# Patient Record
Sex: Male | Born: 1990
Health system: Southern US, Community
[De-identification: ages and names within clinical notes are randomized; demographics above are authoritative.]

## PROBLEM LIST (undated history)

## (undated) DIAGNOSIS — M86159 Other acute osteomyelitis, unspecified femur: Secondary | ICD-10-CM

## (undated) DIAGNOSIS — L02213 Cutaneous abscess of chest wall: Secondary | ICD-10-CM

## (undated) DIAGNOSIS — R7881 Bacteremia: Secondary | ICD-10-CM

## (undated) DIAGNOSIS — M869 Osteomyelitis, unspecified: Secondary | ICD-10-CM

## (undated) DIAGNOSIS — M009 Pyogenic arthritis, unspecified: Secondary | ICD-10-CM

## (undated) DIAGNOSIS — M4628 Osteomyelitis of vertebra, sacral and sacrococcygeal region: Secondary | ICD-10-CM

## (undated) DIAGNOSIS — I2690 Septic pulmonary embolism without acute cor pulmonale: Secondary | ICD-10-CM

---
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
## 2014-09-27 DIAGNOSIS — R7881 Bacteremia: Secondary | ICD-10-CM

## 2014-09-27 HISTORY — DX: Bacteremia: R78.81

## 2015-10-22 ENCOUNTER — Inpatient Hospital Stay (HOSPITAL_COMMUNITY)
Admission: AD | Admit: 2015-10-22 | Discharge: 2015-12-18 | DRG: 003 | Disposition: A | Discharge status: Home Health | Payer: Medicaid Other | Source: Other Acute Inpatient Hospital | Attending: Internal Medicine | Admitting: Internal Medicine

## 2015-10-22 ENCOUNTER — Inpatient Hospital Stay (HOSPITAL_COMMUNITY): Payer: Medicaid Other

## 2015-10-22 DIAGNOSIS — Z452 Encounter for adjustment and management of vascular access device: Secondary | ICD-10-CM

## 2015-10-22 DIAGNOSIS — J15212 Pneumonia due to Methicillin resistant Staphylococcus aureus: Secondary | ICD-10-CM | POA: Diagnosis present

## 2015-10-22 DIAGNOSIS — R4182 Altered mental status, unspecified: Secondary | ICD-10-CM

## 2015-10-22 DIAGNOSIS — A4102 Sepsis due to Methicillin resistant Staphylococcus aureus: Secondary | ICD-10-CM | POA: Diagnosis present

## 2015-10-22 DIAGNOSIS — Y95 Nosocomial condition: Secondary | ICD-10-CM | POA: Diagnosis not present

## 2015-10-22 DIAGNOSIS — I472 Ventricular tachycardia: Secondary | ICD-10-CM | POA: Diagnosis not present

## 2015-10-22 DIAGNOSIS — I2690 Septic pulmonary embolism without acute cor pulmonale: Secondary | ICD-10-CM | POA: Diagnosis present

## 2015-10-22 DIAGNOSIS — J9611 Chronic respiratory failure with hypoxia: Secondary | ICD-10-CM | POA: Insufficient documentation

## 2015-10-22 DIAGNOSIS — R339 Retention of urine, unspecified: Secondary | ICD-10-CM | POA: Diagnosis not present

## 2015-10-22 DIAGNOSIS — M869 Osteomyelitis, unspecified: Secondary | ICD-10-CM

## 2015-10-22 DIAGNOSIS — L02512 Cutaneous abscess of left hand: Secondary | ICD-10-CM | POA: Diagnosis present

## 2015-10-22 DIAGNOSIS — R7881 Bacteremia: Secondary | ICD-10-CM | POA: Diagnosis present

## 2015-10-22 DIAGNOSIS — J9690 Respiratory failure, unspecified, unspecified whether with hypoxia or hypercapnia: Secondary | ICD-10-CM

## 2015-10-22 DIAGNOSIS — Z4659 Encounter for fitting and adjustment of other gastrointestinal appliance and device: Secondary | ICD-10-CM

## 2015-10-22 DIAGNOSIS — N390 Urinary tract infection, site not specified: Secondary | ICD-10-CM | POA: Diagnosis present

## 2015-10-22 DIAGNOSIS — K5900 Constipation, unspecified: Secondary | ICD-10-CM | POA: Diagnosis not present

## 2015-10-22 DIAGNOSIS — J9601 Acute respiratory failure with hypoxia: Secondary | ICD-10-CM | POA: Diagnosis present

## 2015-10-22 DIAGNOSIS — J9602 Acute respiratory failure with hypercapnia: Secondary | ICD-10-CM | POA: Diagnosis present

## 2015-10-22 DIAGNOSIS — D696 Thrombocytopenia, unspecified: Secondary | ICD-10-CM | POA: Diagnosis present

## 2015-10-22 DIAGNOSIS — K6812 Psoas muscle abscess: Secondary | ICD-10-CM | POA: Diagnosis present

## 2015-10-22 DIAGNOSIS — J810 Acute pulmonary edema: Secondary | ICD-10-CM | POA: Diagnosis present

## 2015-10-22 DIAGNOSIS — E871 Hypo-osmolality and hyponatremia: Secondary | ICD-10-CM | POA: Diagnosis not present

## 2015-10-22 DIAGNOSIS — K831 Obstruction of bile duct: Secondary | ICD-10-CM | POA: Diagnosis not present

## 2015-10-22 DIAGNOSIS — R1310 Dysphagia, unspecified: Secondary | ICD-10-CM | POA: Diagnosis not present

## 2015-10-22 DIAGNOSIS — L02511 Cutaneous abscess of right hand: Secondary | ICD-10-CM | POA: Diagnosis present

## 2015-10-22 DIAGNOSIS — L02612 Cutaneous abscess of left foot: Secondary | ICD-10-CM | POA: Diagnosis present

## 2015-10-22 DIAGNOSIS — Y738 Miscellaneous gastroenterology and urology devices associated with adverse incidents, not elsewhere classified: Secondary | ICD-10-CM | POA: Diagnosis not present

## 2015-10-22 DIAGNOSIS — R451 Restlessness and agitation: Secondary | ICD-10-CM | POA: Diagnosis not present

## 2015-10-22 DIAGNOSIS — T8383XA Hemorrhage of genitourinary prosthetic devices, implants and grafts, initial encounter: Secondary | ICD-10-CM | POA: Diagnosis present

## 2015-10-22 DIAGNOSIS — R52 Pain, unspecified: Secondary | ICD-10-CM

## 2015-10-22 DIAGNOSIS — L02415 Cutaneous abscess of right lower limb: Secondary | ICD-10-CM | POA: Diagnosis present

## 2015-10-22 DIAGNOSIS — M868X8 Other osteomyelitis, other site: Secondary | ICD-10-CM | POA: Diagnosis not present

## 2015-10-22 DIAGNOSIS — E039 Hypothyroidism, unspecified: Secondary | ICD-10-CM | POA: Diagnosis present

## 2015-10-22 DIAGNOSIS — I248 Other forms of acute ischemic heart disease: Secondary | ICD-10-CM | POA: Diagnosis present

## 2015-10-22 DIAGNOSIS — D638 Anemia in other chronic diseases classified elsewhere: Secondary | ICD-10-CM | POA: Diagnosis present

## 2015-10-22 DIAGNOSIS — F1123 Opioid dependence with withdrawal: Secondary | ICD-10-CM | POA: Diagnosis not present

## 2015-10-22 DIAGNOSIS — M4627 Osteomyelitis of vertebra, lumbosacral region: Secondary | ICD-10-CM | POA: Diagnosis not present

## 2015-10-22 DIAGNOSIS — J9600 Acute respiratory failure, unspecified whether with hypoxia or hypercapnia: Secondary | ICD-10-CM

## 2015-10-22 DIAGNOSIS — R0603 Acute respiratory distress: Secondary | ICD-10-CM

## 2015-10-22 DIAGNOSIS — L0291 Cutaneous abscess, unspecified: Secondary | ICD-10-CM

## 2015-10-22 DIAGNOSIS — Y92239 Unspecified place in hospital as the place of occurrence of the external cause: Secondary | ICD-10-CM | POA: Diagnosis not present

## 2015-10-22 DIAGNOSIS — F17210 Nicotine dependence, cigarettes, uncomplicated: Secondary | ICD-10-CM | POA: Diagnosis present

## 2015-10-22 DIAGNOSIS — F1910 Other psychoactive substance abuse, uncomplicated: Secondary | ICD-10-CM | POA: Diagnosis present

## 2015-10-22 DIAGNOSIS — R310 Gross hematuria: Secondary | ICD-10-CM | POA: Diagnosis not present

## 2015-10-22 DIAGNOSIS — F22 Delusional disorders: Secondary | ICD-10-CM | POA: Diagnosis not present

## 2015-10-22 DIAGNOSIS — Z930 Tracheostomy status: Secondary | ICD-10-CM | POA: Insufficient documentation

## 2015-10-22 DIAGNOSIS — G9340 Encephalopathy, unspecified: Secondary | ICD-10-CM | POA: Diagnosis present

## 2015-10-22 DIAGNOSIS — J90 Pleural effusion, not elsewhere classified: Secondary | ICD-10-CM | POA: Diagnosis present

## 2015-10-22 DIAGNOSIS — R609 Edema, unspecified: Secondary | ICD-10-CM

## 2015-10-22 DIAGNOSIS — L539 Erythematous condition, unspecified: Secondary | ICD-10-CM

## 2015-10-22 DIAGNOSIS — R0602 Shortness of breath: Secondary | ICD-10-CM

## 2015-10-22 DIAGNOSIS — Z978 Presence of other specified devices: Secondary | ICD-10-CM

## 2015-10-22 DIAGNOSIS — Z9889 Other specified postprocedural states: Secondary | ICD-10-CM

## 2015-10-22 DIAGNOSIS — R401 Stupor: Secondary | ICD-10-CM

## 2015-10-22 DIAGNOSIS — Z22322 Carrier or suspected carrier of Methicillin resistant Staphylococcus aureus: Secondary | ICD-10-CM | POA: Diagnosis present

## 2015-10-22 DIAGNOSIS — M009 Pyogenic arthritis, unspecified: Secondary | ICD-10-CM | POA: Diagnosis present

## 2015-10-22 DIAGNOSIS — R6520 Severe sepsis without septic shock: Secondary | ICD-10-CM | POA: Diagnosis present

## 2015-10-22 DIAGNOSIS — F419 Anxiety disorder, unspecified: Secondary | ICD-10-CM | POA: Diagnosis present

## 2015-10-22 DIAGNOSIS — B9562 Methicillin resistant Staphylococcus aureus infection as the cause of diseases classified elsewhere: Secondary | ICD-10-CM

## 2015-10-22 DIAGNOSIS — M4628 Osteomyelitis of vertebra, sacral and sacrococcygeal region: Secondary | ICD-10-CM | POA: Diagnosis present

## 2015-10-22 DIAGNOSIS — M79641 Pain in right hand: Secondary | ICD-10-CM | POA: Diagnosis present

## 2015-10-22 DIAGNOSIS — E781 Pure hyperglyceridemia: Secondary | ICD-10-CM | POA: Diagnosis present

## 2015-10-22 DIAGNOSIS — Z781 Physical restraint status: Secondary | ICD-10-CM

## 2015-10-22 DIAGNOSIS — J189 Pneumonia, unspecified organism: Secondary | ICD-10-CM

## 2015-10-22 LAB — POCT I-STAT 3, ART BLOOD GAS (G3+)
ACID-BASE DEFICIT: 1 mmol/L (ref 0.0–2.0)
BICARBONATE: 23.3 meq/L (ref 20.0–24.0)
O2 Saturation: 99 %
PO2 ART: 135 mmHg — AB (ref 80.0–100.0)
TCO2: 24 mmol/L (ref 0–100)
pCO2 arterial: 37.5 mmHg (ref 35.0–45.0)
pH, Arterial: 7.402 (ref 7.350–7.450)

## 2015-10-22 LAB — COMPREHENSIVE METABOLIC PANEL
ALBUMIN: 1.8 g/dL — AB (ref 3.5–5.0)
ALK PHOS: 333 U/L — AB (ref 38–126)
ALT: 56 U/L (ref 17–63)
ANION GAP: 10 (ref 5–15)
AST: 52 U/L — AB (ref 15–41)
BILIRUBIN TOTAL: 3 mg/dL — AB (ref 0.3–1.2)
BUN: 14 mg/dL (ref 6–20)
CALCIUM: 7.6 mg/dL — AB (ref 8.9–10.3)
CO2: 22 mmol/L (ref 22–32)
Chloride: 101 mmol/L (ref 101–111)
Creatinine, Ser: 0.85 mg/dL (ref 0.61–1.24)
GFR calc Af Amer: >60 mL/min (ref >60)
GLUCOSE: 92 mg/dL (ref 65–99)
Potassium: 3.5 mmol/L (ref 3.5–5.1)
Sodium: 133 mmol/L — ABNORMAL LOW (ref 135–145)
TOTAL PROTEIN: 5.4 g/dL — AB (ref 6.5–8.1)

## 2015-10-22 LAB — TROPONIN I: Troponin I: 0.24 ng/mL — ABNORMAL HIGH (ref <0.031)

## 2015-10-22 LAB — TRIGLYCERIDES: TRIGLYCERIDES: 164 mg/dL — AB (ref <150)

## 2015-10-22 LAB — BRAIN NATRIURETIC PEPTIDE: B NATRIURETIC PEPTIDE 5: 400.5 pg/mL — AB (ref 0.0–100.0)

## 2015-10-22 LAB — GLUCOSE, CAPILLARY
GLUCOSE-CAPILLARY: 99 mg/dL (ref 65–99)
Glucose-Capillary: 99 mg/dL (ref 65–99)

## 2015-10-22 LAB — PROCALCITONIN: Procalcitonin: 9.9 ng/mL

## 2015-10-22 LAB — LACTIC ACID, PLASMA: LACTIC ACID, VENOUS: 1.8 mmol/L (ref 0.5–2.0)

## 2015-10-22 LAB — CK: CK TOTAL: 29 U/L — AB (ref 49–397)

## 2015-10-22 MED ORDER — PIPERACILLIN-TAZOBACTAM 3.375 G IVPB
3.3750 g | Freq: Three times a day (TID) | INTRAVENOUS | Status: DC
  Administered 2015-10-22 – 2015-10-23 (×2): 3.375 g via INTRAVENOUS
  Filled 2015-10-22 (×4): qty 50

## 2015-10-22 MED ORDER — PROPOFOL 1000 MG/100ML IV EMUL
0.0000 ug/kg/min | INTRAVENOUS | Status: DC
  Administered 2015-10-22: 65 ug/kg/min via INTRAVENOUS
  Administered 2015-10-22 – 2015-10-23 (×3): 40 ug/kg/min via INTRAVENOUS
  Filled 2015-10-22 (×4): qty 100

## 2015-10-22 MED ORDER — SODIUM CHLORIDE 0.9 % IV SOLN
25.0000 ug/h | INTRAVENOUS | Status: DC
  Administered 2015-10-22 – 2015-10-23 (×2): 250 ug/h via INTRAVENOUS
  Filled 2015-10-22 (×2): qty 50

## 2015-10-22 MED ORDER — SODIUM CHLORIDE 0.9 % IV SOLN
INTRAVENOUS | Status: DC
  Administered 2015-10-22: 21:00:00 via INTRAVENOUS

## 2015-10-22 MED ORDER — VANCOMYCIN HCL IN DEXTROSE 1-5 GM/200ML-% IV SOLN
1000.0000 mg | Freq: Three times a day (TID) | INTRAVENOUS | Status: DC
  Administered 2015-10-22 – 2015-10-24 (×6): 1000 mg via INTRAVENOUS
  Filled 2015-10-22 (×8): qty 200

## 2015-10-22 MED ORDER — FENTANYL BOLUS VIA INFUSION
50.0000 ug | INTRAVENOUS | Status: DC | PRN
  Filled 2015-10-22: qty 50

## 2015-10-22 MED ORDER — ACETAMINOPHEN 325 MG PO TABS
650.0000 mg | ORAL_TABLET | Freq: Four times a day (QID) | ORAL | Status: DC | PRN
  Administered 2015-10-22 – 2015-10-25 (×4): 650 mg via ORAL
  Filled 2015-10-22 (×4): qty 2

## 2015-10-22 MED ORDER — ANTISEPTIC ORAL RINSE SOLUTION (CORINZ): 7.0000 mL | OROMUCOSAL | Status: DC

## 2015-10-22 MED ORDER — SODIUM CHLORIDE 0.9 % IV SOLN
Freq: Once | INTRAVENOUS | Status: AC
  Administered 2015-10-22: 22:00:00 via INTRAVENOUS

## 2015-10-22 MED ORDER — CHLORHEXIDINE GLUCONATE 0.12% ORAL RINSE (MEDLINE KIT)
15.0000 mL | Freq: Two times a day (BID) | OROMUCOSAL | Status: DC
  Administered 2015-10-23 – 2015-10-29 (×14): 15 mL via OROMUCOSAL

## 2015-10-22 MED ORDER — FENTANYL CITRATE (PF) 100 MCG/2ML IJ SOLN
50.0000 ug | Freq: Once | INTRAMUSCULAR | Status: AC
  Administered 2015-10-22: 50 ug via INTRAVENOUS

## 2015-10-22 MED ORDER — CHLORHEXIDINE GLUCONATE 0.12% ORAL RINSE (MEDLINE KIT)
15.0000 mL | Freq: Two times a day (BID) | OROMUCOSAL | Status: DC
  Administered 2015-10-22: 15 mL via OROMUCOSAL

## 2015-10-22 MED ORDER — HEPARIN SODIUM (PORCINE) 5000 UNIT/ML IJ SOLN
5000.0000 [IU] | Freq: Three times a day (TID) | INTRAMUSCULAR | Status: DC
  Administered 2015-10-22 – 2015-11-04 (×32): 5000 [IU] via SUBCUTANEOUS
  Filled 2015-10-22 (×43): qty 1

## 2015-10-22 MED ORDER — SODIUM CHLORIDE 0.9 % IV BOLUS (SEPSIS): 1000.0000 mL | Freq: Once | INTRAVENOUS | Status: DC

## 2015-10-22 MED ORDER — ANTISEPTIC ORAL RINSE SOLUTION (CORINZ)
7.0000 mL | Freq: Four times a day (QID) | OROMUCOSAL | Status: DC
  Administered 2015-10-22 – 2015-10-28 (×23): 7 mL via OROMUCOSAL

## 2015-10-22 MED ORDER — PROPOFOL 1000 MG/100ML IV EMUL: 0.0000 ug/kg/min | INTRAVENOUS | Status: DC

## 2015-10-22 MED ORDER — PANTOPRAZOLE SODIUM 40 MG PO PACK
40.0000 mg | PACK | Freq: Every day | ORAL | Status: DC
  Administered 2015-10-22: 40 mg
  Filled 2015-10-22 (×2): qty 20

## 2015-10-22 NOTE — Progress Notes (Signed)
eLink Physician-Brief Progress Note Patient Name: Jolene Schimkerey Netherland DOB: [birth year] MRN: [id number]   Date of Service  10/22/2015  HPI/Events of Note  New admission from [facility] - 25 yo man, drug abuse and bacteremia, septic emboli. Probable endocarditis although I do not know if this has been established yet. Will review [facility] notes when available.   eICU Interventions  - vent orders - sedation orders - CXR now - repeat cx's - tylenol for fevers     Intervention Category Major Interventions: Respiratory failure - evaluation and management;Sepsis - evaluation and management  Nasier Thumm S. 10/22/2015, 5:54 PM

## 2015-10-22 NOTE — Progress Notes (Signed)
Pharmacy Antibiotic Note  Kevin Austin is a 25 y.o. male admitted on 10/22/2015 with sepsis.  Pharmacy has been consulted for vancomycin and Zosyn dosing.  Received 1500 mg of [facility] this morning at 10 AM, and a dose of cefepime at 11 AM.  Plan: Vancomycin 1g IV q 8 hrs.  Next dose now. Zosyn 3.375g IV q 8 hrs - 4 hr extended infusion. F/u cultures, renal function, and clinical progress.  Height: 5\' 11"  (180.3 cm) Weight: 183 lb 10.3 oz (83.3 kg) IBW/kg (Calculated) : 75.3  Temp (24hrs), Avg:101.3 F (38.5 C), Min:101.1 F (38.4 C), Max:101.3 F (38.5 C)  No results for input(s): WBC, CREATININE, LATICACIDVEN, VANCOTROUGH, VANCOPEAK, VANCORANDOM, GENTTROUGH, GENTPEAK, GENTRANDOM, TOBRATROUGH, TOBRAPEAK, TOBRARND, AMIKACINPEAK, AMIKACINTROU, AMIKACIN in the last 168 hours.  CrCl cannot be calculated (Patient has no serum creatinine result on file.).    Allergies not on file  Antimicrobials this admission: Vanc 6/26 >  Zosyn 6/26 >> Cefepime 6/26 >6/26  Dose adjustments this admission:   Microbiology results: 6/26 BCx:  6/26 UCx:  6/26 Sputum:   Thank you for allowing pharmacy to be a part of this patient's care.  Tad MooreJessica Fawnda Vitullo, Pharm D, BCPS  Clinical Pharmacist Pager [phone]  10/22/2015 7:31 PM
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
## 2015-10-23 ENCOUNTER — Inpatient Hospital Stay (HOSPITAL_COMMUNITY): Payer: Medicaid Other

## 2015-10-23 ENCOUNTER — Encounter (HOSPITAL_COMMUNITY)
Admission: AD | Disposition: A | Discharge status: Home Health | Payer: Self-pay | Source: Other Acute Inpatient Hospital | Attending: Pulmonary Disease

## 2015-10-23 ENCOUNTER — Inpatient Hospital Stay (HOSPITAL_COMMUNITY): Payer: Medicaid Other | Admitting: Certified Registered Nurse Anesthetist

## 2015-10-23 DIAGNOSIS — L0291 Cutaneous abscess, unspecified: Secondary | ICD-10-CM

## 2015-10-23 DIAGNOSIS — L02512 Cutaneous abscess of left hand: Secondary | ICD-10-CM | POA: Diagnosis present

## 2015-10-23 HISTORY — PX: I & D EXTREMITY: SHX5045

## 2015-10-23 HISTORY — PX: IRRIGATION AND DEBRIDEMENT FOOT: SHX6602

## 2015-10-23 LAB — BLOOD CULTURE ID PANEL (REFLEXED)
Acinetobacter baumannii: NOT DETECTED
CANDIDA GLABRATA: NOT DETECTED
CANDIDA KRUSEI: NOT DETECTED
CANDIDA PARAPSILOSIS: NOT DETECTED
CANDIDA TROPICALIS: NOT DETECTED
Candida albicans: NOT DETECTED
Carbapenem resistance: NOT DETECTED
ESCHERICHIA COLI: NOT DETECTED
Enterobacter cloacae complex: NOT DETECTED
Enterobacteriaceae species: NOT DETECTED
Enterococcus species: NOT DETECTED
HAEMOPHILUS INFLUENZAE: NOT DETECTED
KLEBSIELLA PNEUMONIAE: NOT DETECTED
Klebsiella oxytoca: NOT DETECTED
LISTERIA MONOCYTOGENES: NOT DETECTED
METHICILLIN RESISTANCE: DETECTED — AB
Neisseria meningitidis: NOT DETECTED
PROTEUS SPECIES: NOT DETECTED
Pseudomonas aeruginosa: NOT DETECTED
SERRATIA MARCESCENS: NOT DETECTED
STREPTOCOCCUS PYOGENES: NOT DETECTED
Staphylococcus aureus (BCID): DETECTED — AB
Staphylococcus species: DETECTED — AB
Streptococcus agalactiae: NOT DETECTED
Streptococcus pneumoniae: NOT DETECTED
Streptococcus species: NOT DETECTED
Vancomycin resistance: NOT DETECTED

## 2015-10-23 LAB — CBC WITH DIFFERENTIAL/PLATELET
BASOS PCT: 0 %
Basophils Absolute: 0 10*3/uL (ref 0.0–0.1)
EOS ABS: 0 10*3/uL (ref 0.0–0.7)
EOS PCT: 0 %
HEMATOCRIT: 33.2 % — AB (ref 39.0–52.0)
Hemoglobin: 11.1 g/dL — ABNORMAL LOW (ref 13.0–17.0)
LYMPHS ABS: 1.1 10*3/uL (ref 0.7–4.0)
Lymphocytes Relative: 6 %
MCH: 27.3 pg (ref 26.0–34.0)
MCHC: 33.4 g/dL (ref 30.0–36.0)
MCV: 81.8 fL (ref 78.0–100.0)
MONO ABS: 1.1 10*3/uL — AB (ref 0.1–1.0)
Monocytes Relative: 6 %
NEUTROS ABS: 16.3 10*3/uL — AB (ref 1.7–7.7)
NEUTROS PCT: 88 %
PLATELETS: 82 10*3/uL — AB (ref 150–400)
RBC: 4.06 MIL/uL — ABNORMAL LOW (ref 4.22–5.81)
RDW: 15.8 % — AB (ref 11.5–15.5)
WBC: 18.5 10*3/uL — ABNORMAL HIGH (ref 4.0–10.5)

## 2015-10-23 LAB — HIV ANTIBODY (ROUTINE TESTING W REFLEX): HIV Screen 4th Generation wRfx: NONREACTIVE

## 2015-10-23 LAB — TROPONIN I
TROPONIN I: 0.06 ng/mL — AB (ref <0.03)
TROPONIN I: 0.1 ng/mL — AB (ref <0.03)

## 2015-10-23 LAB — APTT: APTT: 40 s — AB (ref 24–37)

## 2015-10-23 LAB — TRIGLYCERIDES: TRIGLYCERIDES: 178 mg/dL — AB (ref <150)

## 2015-10-23 LAB — PROTIME-INR
INR: 1.35 (ref 0.00–1.49)
Prothrombin Time: 16.8 seconds — ABNORMAL HIGH (ref 11.6–15.2)

## 2015-10-23 LAB — MRSA PCR SCREENING: MRSA by PCR: POSITIVE — AB

## 2015-10-23 LAB — PROCALCITONIN: PROCALCITONIN: 10.77 ng/mL

## 2015-10-23 SURGERY — IRRIGATION AND DEBRIDEMENT EXTREMITY
Anesthesia: General | Site: Hand | Laterality: Left

## 2015-10-23 MED ORDER — SODIUM CHLORIDE 0.9 % IR SOLN
Status: DC | PRN
  Administered 2015-10-23 (×2): 3000 mL

## 2015-10-23 MED ORDER — LIDOCAINE HCL (CARDIAC) 20 MG/ML IV SOLN
INTRAVENOUS | Status: DC | PRN
  Administered 2015-10-23: 100 mg via INTRAVENOUS

## 2015-10-23 MED ORDER — MIDAZOLAM HCL 5 MG/5ML IJ SOLN
INTRAMUSCULAR | Status: DC | PRN
  Administered 2015-10-23: 4 mg via INTRAVENOUS
  Administered 2015-10-23: 2 mg via INTRAVENOUS

## 2015-10-23 MED ORDER — FENTANYL CITRATE (PF) 250 MCG/5ML IJ SOLN
INTRAMUSCULAR | Status: AC
  Filled 2015-10-23: qty 5

## 2015-10-23 MED ORDER — LACTATED RINGERS IV SOLN
INTRAVENOUS | Status: DC | PRN
  Administered 2015-10-23 (×2): via INTRAVENOUS

## 2015-10-23 MED ORDER — MUPIROCIN 2 % EX OINT
1.0000 "application " | TOPICAL_OINTMENT | Freq: Two times a day (BID) | CUTANEOUS | Status: AC
  Administered 2015-10-23 – 2015-10-27 (×10): 1 via NASAL
  Filled 2015-10-23: qty 22

## 2015-10-23 MED ORDER — MIDAZOLAM HCL 2 MG/2ML IJ SOLN
INTRAMUSCULAR | Status: AC
  Filled 2015-10-23: qty 2

## 2015-10-23 MED ORDER — DEXTROSE 5 % IV SOLN
2.0000 g | Freq: Three times a day (TID) | INTRAVENOUS | Status: DC
  Administered 2015-10-23 – 2015-10-24 (×3): 2 g via INTRAVENOUS
  Filled 2015-10-23 (×5): qty 2

## 2015-10-23 MED ORDER — FAMOTIDINE IN NACL 20-0.9 MG/50ML-% IV SOLN
20.0000 mg | Freq: Two times a day (BID) | INTRAVENOUS | Status: DC
  Administered 2015-10-23 – 2015-10-24 (×3): 20 mg via INTRAVENOUS
  Filled 2015-10-23 (×5): qty 50

## 2015-10-23 MED ORDER — ROCURONIUM BROMIDE 100 MG/10ML IV SOLN
INTRAVENOUS | Status: DC | PRN
  Administered 2015-10-23: 20 mg via INTRAVENOUS
  Administered 2015-10-23: 50 mg via INTRAVENOUS

## 2015-10-23 MED ORDER — 0.9 % SODIUM CHLORIDE (POUR BTL) OPTIME
TOPICAL | Status: DC | PRN
  Administered 2015-10-23: 1000 mL

## 2015-10-23 MED ORDER — CHLORHEXIDINE GLUCONATE CLOTH 2 % EX PADS
6.0000 | MEDICATED_PAD | Freq: Every day | CUTANEOUS | Status: AC
  Administered 2015-10-23 – 2015-10-27 (×5): 6 via TOPICAL

## 2015-10-23 MED ORDER — FENTANYL CITRATE (PF) 100 MCG/2ML IJ SOLN
50.0000 ug | INTRAMUSCULAR | Status: DC
  Administered 2015-10-23: 50 ug via INTRAVENOUS
  Filled 2015-10-23: qty 2

## 2015-10-23 MED ORDER — PROPOFOL 10 MG/ML IV BOLUS
INTRAVENOUS | Status: AC
  Filled 2015-10-23: qty 20

## 2015-10-23 MED ORDER — SUCCINYLCHOLINE CHLORIDE 20 MG/ML IJ SOLN
INTRAMUSCULAR | Status: DC | PRN
  Administered 2015-10-23: 100 mg via INTRAVENOUS

## 2015-10-23 MED ORDER — FENTANYL CITRATE (PF) 100 MCG/2ML IJ SOLN
100.0000 ug | INTRAMUSCULAR | Status: AC | PRN
  Administered 2015-10-24 (×3): 100 ug via INTRAVENOUS
  Filled 2015-10-23 (×3): qty 2

## 2015-10-23 MED ORDER — LACTATED RINGERS IV SOLN
INTRAVENOUS | Status: DC
  Administered 2015-10-23 – 2015-10-24 (×2): via INTRAVENOUS

## 2015-10-23 MED ORDER — METOPROLOL TARTRATE 5 MG/5ML IV SOLN
2.5000 mg | INTRAVENOUS | Status: DC | PRN
  Administered 2015-10-23 – 2015-11-17 (×16): 5 mg via INTRAVENOUS
  Administered 2015-11-17: 2.5 mg via INTRAVENOUS
  Filled 2015-10-23 (×17): qty 5

## 2015-10-23 MED ORDER — PROPOFOL 1000 MG/100ML IV EMUL
0.0000 ug/kg/min | INTRAVENOUS | Status: DC
  Administered 2015-10-23 – 2015-10-26 (×17): 50 ug/kg/min via INTRAVENOUS
  Administered 2015-10-27: 35 ug/kg/min via INTRAVENOUS
  Administered 2015-10-27 (×2): 40 ug/kg/min via INTRAVENOUS
  Administered 2015-10-27: 50 ug/kg/min via INTRAVENOUS
  Administered 2015-10-27: 40 ug/kg/min via INTRAVENOUS
  Administered 2015-10-27: 50 ug/kg/min via INTRAVENOUS
  Administered 2015-10-28 (×2): 40 ug/kg/min via INTRAVENOUS
  Filled 2015-10-23 (×28): qty 100

## 2015-10-23 MED ORDER — PROPOFOL 10 MG/ML IV BOLUS
INTRAVENOUS | Status: DC | PRN
  Administered 2015-10-23: 130 mg via INTRAVENOUS

## 2015-10-23 MED ORDER — FENTANYL CITRATE (PF) 100 MCG/2ML IJ SOLN
INTRAMUSCULAR | Status: DC | PRN
  Administered 2015-10-23: 200 ug via INTRAVENOUS
  Administered 2015-10-23: 100 ug via INTRAVENOUS
  Administered 2015-10-23: 50 ug via INTRAVENOUS
  Administered 2015-10-23: 100 ug via INTRAVENOUS
  Administered 2015-10-23: 50 ug via INTRAVENOUS
  Administered 2015-10-23: 100 ug via INTRAVENOUS
  Administered 2015-10-23: 150 ug via INTRAVENOUS
  Administered 2015-10-23: 250 ug via INTRAVENOUS

## 2015-10-23 MED ORDER — FENTANYL CITRATE (PF) 100 MCG/2ML IJ SOLN
100.0000 ug | INTRAMUSCULAR | Status: DC | PRN
  Administered 2015-10-23 – 2015-10-24 (×2): 100 ug via INTRAVENOUS
  Filled 2015-10-23 (×2): qty 2

## 2015-10-23 MED ORDER — IBUPROFEN 200 MG PO TABS
400.0000 mg | ORAL_TABLET | Freq: Four times a day (QID) | ORAL | Status: DC | PRN
  Administered 2015-10-23 – 2015-12-17 (×16): 400 mg via ORAL
  Filled 2015-10-23 (×16): qty 2

## 2015-10-23 MED ORDER — METHADONE HCL 10 MG PO TABS
10.0000 mg | ORAL_TABLET | Freq: Three times a day (TID) | ORAL | Status: DC
  Administered 2015-10-23 – 2015-10-25 (×5): 10 mg via ORAL
  Filled 2015-10-23 (×5): qty 1

## 2015-10-23 MED ORDER — PROPOFOL 500 MG/50ML IV EMUL
INTRAVENOUS | Status: DC | PRN
  Administered 2015-10-23: 75 ug/kg/min via INTRAVENOUS

## 2015-10-23 MED FILL — Midazolam HCl Inj 5 MG/5ML (Base Equivalent): INTRAMUSCULAR | Qty: 5 | Status: AC

## 2015-10-23 SURGICAL SUPPLY — 67 items
BANDAGE COBAN STERILE 2 (GAUZE/BANDAGES/DRESSINGS) IMPLANT
BANDAGE ELASTIC 3 VELCRO ST LF (GAUZE/BANDAGES/DRESSINGS) ×2 IMPLANT
BANDAGE ELASTIC 4 VELCRO ST LF (GAUZE/BANDAGES/DRESSINGS) ×8 IMPLANT
BANDAGE ESMARK 6X9 LF (GAUZE/BANDAGES/DRESSINGS) IMPLANT
BLADE SURG 15 STRL LF DISP TIS (BLADE) IMPLANT
BLADE SURG 15 STRL SS (BLADE) ×4
BNDG CMPR 9X4 STRL LF SNTH (GAUZE/BANDAGES/DRESSINGS) ×4
BNDG CMPR 9X6 STRL LF SNTH (GAUZE/BANDAGES/DRESSINGS) ×2
BNDG COHESIVE 1X5 TAN STRL LF (GAUZE/BANDAGES/DRESSINGS) IMPLANT
BNDG CONFORM 2 STRL LF (GAUZE/BANDAGES/DRESSINGS) IMPLANT
BNDG ESMARK 4X9 LF (GAUZE/BANDAGES/DRESSINGS) ×4 IMPLANT
BNDG ESMARK 6X9 LF (GAUZE/BANDAGES/DRESSINGS) ×4
BNDG GAUZE ELAST 4 BULKY (GAUZE/BANDAGES/DRESSINGS) ×8 IMPLANT
CONT SPEC 4OZ CLIKSEAL STRL BL (MISCELLANEOUS) ×2 IMPLANT
CORDS BIPOLAR (ELECTRODE) ×6 IMPLANT
COVER MAYO STAND STRL (DRAPES) ×2 IMPLANT
COVER SURGICAL LIGHT HANDLE (MISCELLANEOUS) ×4 IMPLANT
CUFF TOURNIQUET SINGLE 18IN (TOURNIQUET CUFF) ×4 IMPLANT
DECANTER SPIKE VIAL GLASS SM (MISCELLANEOUS) ×2 IMPLANT
DRAIN PENROSE 1/4X12 LTX STRL (WOUND CARE) IMPLANT
DRAPE EXTREMITY T 121X128X90 (DRAPE) ×4 IMPLANT
DRAPE SURG 17X23 STRL (DRAPES) ×4 IMPLANT
DRSG ADAPTIC 3X8 NADH LF (GAUZE/BANDAGES/DRESSINGS) IMPLANT
DRSG EMULSION OIL 3X3 NADH (GAUZE/BANDAGES/DRESSINGS) ×2 IMPLANT
DRSG PAD ABDOMINAL 8X10 ST (GAUZE/BANDAGES/DRESSINGS) ×6 IMPLANT
GAUZE IODOFORM PACK 1/2 7832 (GAUZE/BANDAGES/DRESSINGS) ×6 IMPLANT
GAUZE SPONGE 4X4 12PLY STRL (GAUZE/BANDAGES/DRESSINGS) ×8 IMPLANT
GAUZE XEROFORM 1X8 LF (GAUZE/BANDAGES/DRESSINGS) ×2 IMPLANT
GLOVE BIO SURGEON STRL SZ7.5 (GLOVE) ×16 IMPLANT
GLOVE BIOGEL PI IND STRL 8 (GLOVE) ×2 IMPLANT
GLOVE BIOGEL PI INDICATOR 8 (GLOVE) ×14
GOWN STRL REUS W/ TWL LRG LVL3 (GOWN DISPOSABLE) ×2 IMPLANT
GOWN STRL REUS W/TWL LRG LVL3 (GOWN DISPOSABLE) ×24
KIT BASIN OR (CUSTOM PROCEDURE TRAY) ×4 IMPLANT
KIT ROOM TURNOVER OR (KITS) ×4 IMPLANT
LOOP VESSEL MAXI BLUE (MISCELLANEOUS) IMPLANT
MANIFOLD NEPTUNE II (INSTRUMENTS) ×4 IMPLANT
NDL HYPO 25X1 1.5 SAFETY (NEEDLE) IMPLANT
NEEDLE HYPO 25X1 1.5 SAFETY (NEEDLE) IMPLANT
NS IRRIG 1000ML POUR BTL (IV SOLUTION) ×4 IMPLANT
PACK ORTHO EXTREMITY (CUSTOM PROCEDURE TRAY) ×4 IMPLANT
PAD ARMBOARD 7.5X6 YLW CONV (MISCELLANEOUS) ×8 IMPLANT
PAD CAST 4YDX4 CTTN HI CHSV (CAST SUPPLIES) IMPLANT
PADDING CAST COTTON 4X4 STRL (CAST SUPPLIES) ×16
PENCIL BUTTON HOLSTER BLD 10FT (ELECTRODE) ×2 IMPLANT
SCRUB BETADINE 4OZ XXX (MISCELLANEOUS) ×4 IMPLANT
SET CYSTO W/LG BORE CLAMP LF (SET/KITS/TRAYS/PACK) ×4 IMPLANT
SOLUTION BETADINE 4OZ (MISCELLANEOUS) ×4 IMPLANT
SPLINT PLASTER EXTRA FAST 3X15 (CAST SUPPLIES) ×4
SPLINT PLASTER GYPS XFAST 3X15 (CAST SUPPLIES) IMPLANT
SPONGE LAP 4X18 X RAY DECT (DISPOSABLE) ×4 IMPLANT
STOCKINETTE 6  STRL (DRAPES) ×2
STOCKINETTE 6 STRL (DRAPES) IMPLANT
STOCKINETTE IMPERVIOUS 9X36 MD (GAUZE/BANDAGES/DRESSINGS) ×2 IMPLANT
SUT ETHILON 3 0 PS 1 (SUTURE) ×2 IMPLANT
SUT ETHILON 4 0 P 3 18 (SUTURE) IMPLANT
SUT ETHILON 4 0 PS 2 18 (SUTURE) ×2 IMPLANT
SUT MON AB 5-0 P3 18 (SUTURE) IMPLANT
SYR CONTROL 10ML LL (SYRINGE) IMPLANT
TOWEL OR 17X24 6PK STRL BLUE (TOWEL DISPOSABLE) ×4 IMPLANT
TOWEL OR 17X26 10 PK STRL BLUE (TOWEL DISPOSABLE) ×6 IMPLANT
TUBE ANAEROBIC SPECIMEN COL (MISCELLANEOUS) ×2 IMPLANT
TUBE CONNECTING 12'X1/4 (SUCTIONS) ×2
TUBE CONNECTING 12X1/4 (SUCTIONS) ×4 IMPLANT
TUBE FEEDING 5FR 15 INCH (TUBING) IMPLANT
UNDERPAD 30X30 INCONTINENT (UNDERPADS AND DIAPERS) ×6 IMPLANT
YANKAUER SUCT BULB TIP NO VENT (SUCTIONS) ×4 IMPLANT

## 2015-10-23 NOTE — Progress Notes (Signed)
  PHARMACY - PHYSICIAN COMMUNICATION CRITICAL VALUE ALERT - BLOOD CULTURE IDENTIFICATION (BCID)  Results for orders placed or performed during the hospital encounter of 10/22/15  Blood Culture ID Panel (Reflexed) (Collected: 10/22/2015  7:02 PM)  Result Value Ref Range   Enterococcus species NOT DETECTED NOT DETECTED   Vancomycin resistance NOT DETECTED NOT DETECTED   Listeria monocytogenes NOT DETECTED NOT DETECTED   Staphylococcus species DETECTED (A) NOT DETECTED   Staphylococcus aureus DETECTED (A) NOT DETECTED   Methicillin resistance DETECTED (A) NOT DETECTED   Streptococcus species NOT DETECTED NOT DETECTED   Streptococcus agalactiae NOT DETECTED NOT DETECTED   Streptococcus pneumoniae NOT DETECTED NOT DETECTED   Streptococcus pyogenes NOT DETECTED NOT DETECTED   Acinetobacter baumannii NOT DETECTED NOT DETECTED   Enterobacteriaceae species NOT DETECTED NOT DETECTED   Enterobacter cloacae complex NOT DETECTED NOT DETECTED   Escherichia coli NOT DETECTED NOT DETECTED   Klebsiella oxytoca NOT DETECTED NOT DETECTED   Klebsiella pneumoniae NOT DETECTED NOT DETECTED   Proteus species NOT DETECTED NOT DETECTED   Serratia marcescens NOT DETECTED NOT DETECTED   Carbapenem resistance NOT DETECTED NOT DETECTED   Haemophilus influenzae NOT DETECTED NOT DETECTED   Neisseria meningitidis NOT DETECTED NOT DETECTED   Pseudomonas aeruginosa NOT DETECTED NOT DETECTED   Candida albicans NOT DETECTED NOT DETECTED   Candida glabrata NOT DETECTED NOT DETECTED   Candida krusei NOT DETECTED NOT DETECTED   Candida parapsilosis NOT DETECTED NOT DETECTED   Candida tropicalis NOT DETECTED NOT DETECTED    Name of physician (or Provider) Contacted: Dr. Drue SecondSnider  Changes to prescribed antibiotics required: Vanc + Ceftaz. MRSA in all 4 blood cultures. ID will see.  Lorin Gawron L. Roseanne RenoStewart, PharmD PGY2 Infectious Diseases Pharmacy Resident Pager: [phone] 10/23/2015 11:15 AM

## 2015-10-23 NOTE — Consult Note (Signed)
Kevin Austin is an 25 y.o. male.   Chief Complaint: right hand abscess HPI: 25 yo rhd male states he has been having worsening pain in right hand over past several days.  Throbbing, burning, aching pain of 10/10 severity.  Has had fevers, chills, night sweats.  Was admitted and [facility] and transferred to [facility] yesterday.  Currently in ICU with septic emboli of multiple locations including lungs, kidneys, hands.  Currently on ceftazidime and vancomycin.  Blood cultures have grown staph.  States he has injected meth in right forearm, but not in right or left hand.  Case discussed with Merrie Roof, MD and his note from 10/23/2015 reviewed. Xrays viewed and interpreted by me: 3 views right hand show no fractures, dislocations, radioopaque foreign bodies. Labs reviewed: WBC 18.5  Allergies:  Allergies  Allergen Reactions  . Ketorolac Nausea Only    Per [facility] records  . Tramadol Nausea Only    Per [facility] records    No past medical history on file.  No past surgical history on file.  Family History: No family history on file.  Social History:   has no tobacco, alcohol, and drug history on file.  Medications: Medications Prior to Admission  Medication Sig Dispense Refill  . acetaminophen (TYLENOL) 325 MG tablet Take 650 mg by mouth every 6 (six) hours as needed for mild pain.      Results for orders placed or performed during the hospital encounter of 10/22/15 (from the past 48 hour(s))  MRSA PCR Screening     Status: Abnormal   Collection Time: 10/22/15  5:41 PM  Result Value Ref Range   MRSA by PCR POSITIVE (A) NEGATIVE    Comment:        The GeneXpert MRSA Assay (FDA approved for NASAL specimens only), is one component of a comprehensive MRSA colonization surveillance program. It is not intended to diagnose MRSA infection nor to guide or monitor treatment for MRSA infections. RESULT CALLED TO, READ BACK BY AND VERIFIED WITH: A THOMPSON,RN '@[id number]'  10/23/15  MKELLY   Glucose, capillary     Status: None   Collection Time: 10/22/15  5:50 PM  Result Value Ref Range   Glucose-Capillary 99 65 - 99 mg/dL  I-STAT 3, arterial blood gas (G3+)     Status: Abnormal   Collection Time: 10/22/15  6:32 PM  Result Value Ref Range   pH, Arterial 7.402 7.350 - 7.450   pCO2 arterial 37.5 35.0 - 45.0 mmHg   pO2, Arterial 135.0 (H) 80.0 - 100.0 mmHg   Bicarbonate 23.3 20.0 - 24.0 mEq/L   TCO2 24 0 - 100 mmol/L   O2 Saturation 99.0 %   Acid-base deficit 1.0 0.0 - 2.0 mmol/L   Patient temperature HIDE    Sample type ARTERIAL   Triglycerides     Status: Abnormal   Collection Time: 10/22/15  6:44 PM  Result Value Ref Range   Triglycerides 164 (H) <150 mg/dL  Culture, blood (Routine X 2) w Reflex to ID Panel     Status: None (Preliminary result)   Collection Time: 10/22/15  6:44 PM  Result Value Ref Range   Specimen Description BLOOD LEFT HAND    Special Requests BOTTLES DRAWN AEROBIC AND ANAEROBIC 5 CC     Culture  Setup Time      GRAM POSITIVE COCCI IN CLUSTERS IN BOTH AEROBIC AND ANAEROBIC BOTTLES CRITICAL RESULT CALLED TO, READ BACK BY AND VERIFIED WITH: C STEWART,PHARMD AT 1040 10/23/15 BY L BENFIELD  Culture GRAM POSITIVE COCCI    Report Status PENDING   Comprehensive metabolic panel     Status: Abnormal   Collection Time: 10/22/15  6:44 PM  Result Value Ref Range   Sodium 133 (L) 135 - 145 mmol/L   Potassium 3.5 3.5 - 5.1 mmol/L   Chloride 101 101 - 111 mmol/L   CO2 22 22 - 32 mmol/L   Glucose, Bld 92 65 - 99 mg/dL   BUN 14 6 - 20 mg/dL   Creatinine, Ser 0.85 0.61 - 1.24 mg/dL   Calcium 7.6 (L) 8.9 - 10.3 mg/dL   Total Protein 5.4 (L) 6.5 - 8.1 g/dL   Albumin 1.8 (L) 3.5 - 5.0 g/dL   AST 52 (H) 15 - 41 U/L   ALT 56 17 - 63 U/L   Alkaline Phosphatase 333 (H) 38 - 126 U/L   Total Bilirubin 3.0 (H) 0.3 - 1.2 mg/dL   GFR calc non Af Amer >60 >60 mL/min   GFR calc Af Amer >60 >60 mL/min    Comment: (NOTE) The eGFR has been calculated using  the CKD EPI equation. This calculation has not been validated in all clinical situations. eGFR's persistently <60 mL/min signify possible Chronic Kidney Disease.    Anion gap 10 5 - 15  Lactic acid, plasma     Status: None   Collection Time: 10/22/15  6:44 PM  Result Value Ref Range   Lactic Acid, Venous 1.8 0.5 - 2.0 mmol/L  Troponin I (q 6hr x 3)     Status: Abnormal   Collection Time: 10/22/15  6:44 PM  Result Value Ref Range   Troponin I 0.24 (H) <0.031 ng/mL    Comment:        PERSISTENTLY INCREASED TROPONIN VALUES IN THE RANGE OF 0.04-0.49 ng/mL CAN BE SEEN IN:       -UNSTABLE ANGINA       -CONGESTIVE HEART FAILURE       -MYOCARDITIS       -CHEST TRAUMA       -ARRYHTHMIAS       -LATE PRESENTING MYOCARDIAL INFARCTION       -COPD   CLINICAL FOLLOW-UP RECOMMENDED.   CK     Status: Abnormal   Collection Time: 10/22/15  6:44 PM  Result Value Ref Range   Total CK 29 (L) 49 - 397 U/L  Culture, blood (Routine X 2) w Reflex to ID Panel     Status: None (Preliminary result)   Collection Time: 10/22/15  7:02 PM  Result Value Ref Range   Specimen Description BLOOD LEFT HAND    Special Requests BOTTLES DRAWN AEROBIC AND ANAEROBIC 5 CC     Culture  Setup Time      GRAM POSITIVE COCCI IN CLUSTERS IN BOTH AEROBIC AND ANAEROBIC BOTTLES Organism ID to follow CRITICAL RESULT CALLED TO, READ BACK BY AND VERIFIED WITH: C STEWART,PHARMD AT 1040 10/23/15 BY L BENFIELD    Culture NO GROWTH < 24 HOURS    Report Status PENDING   Blood Culture ID Panel (Reflexed)     Status: Abnormal   Collection Time: 10/22/15  7:02 PM  Result Value Ref Range   Enterococcus species NOT DETECTED NOT DETECTED   Vancomycin resistance NOT DETECTED NOT DETECTED   Listeria monocytogenes NOT DETECTED NOT DETECTED   Staphylococcus species DETECTED (A) NOT DETECTED    Comment: CRITICAL RESULT CALLED TO, READ BACK BY AND VERIFIED WITH: C STEWART,PHARMD AT 1040 10/23/15 BY L BENFIELD  Staphylococcus aureus  DETECTED (A) NOT DETECTED    Comment: CRITICAL RESULT CALLED TO, READ BACK BY AND VERIFIED WITH: C STEWART,PHARMD AT 1040 10/23/15 BY L BENFIELD    Methicillin resistance DETECTED (A) NOT DETECTED    Comment: CRITICAL RESULT CALLED TO, READ BACK BY AND VERIFIED WITH: C STEWART,PHARMD AT 1040 10/23/15 BY L BENFIELD    Streptococcus species NOT DETECTED NOT DETECTED   Streptococcus agalactiae NOT DETECTED NOT DETECTED   Streptococcus pneumoniae NOT DETECTED NOT DETECTED   Streptococcus pyogenes NOT DETECTED NOT DETECTED   Acinetobacter baumannii NOT DETECTED NOT DETECTED   Enterobacteriaceae species NOT DETECTED NOT DETECTED   Enterobacter cloacae complex NOT DETECTED NOT DETECTED   Escherichia coli NOT DETECTED NOT DETECTED   Klebsiella oxytoca NOT DETECTED NOT DETECTED   Klebsiella pneumoniae NOT DETECTED NOT DETECTED   Proteus species NOT DETECTED NOT DETECTED   Serratia marcescens NOT DETECTED NOT DETECTED   Carbapenem resistance NOT DETECTED NOT DETECTED   Haemophilus influenzae NOT DETECTED NOT DETECTED   Neisseria meningitidis NOT DETECTED NOT DETECTED   Pseudomonas aeruginosa NOT DETECTED NOT DETECTED   Candida albicans NOT DETECTED NOT DETECTED   Candida glabrata NOT DETECTED NOT DETECTED   Candida krusei NOT DETECTED NOT DETECTED   Candida parapsilosis NOT DETECTED NOT DETECTED   Candida tropicalis NOT DETECTED NOT DETECTED  Brain natriuretic peptide     Status: Abnormal   Collection Time: 10/22/15  7:27 PM  Result Value Ref Range   B Natriuretic Peptide 400.5 (H) 0.0 - 100.0 pg/mL  Procalcitonin - Baseline     Status: None   Collection Time: 10/22/15  7:27 PM  Result Value Ref Range   Procalcitonin 9.90 ng/mL    Comment:        Interpretation: PCT > 2 ng/mL: Systemic infection (sepsis) is likely, unless other causes are known. (NOTE)         ICU PCT Algorithm               Non ICU PCT Algorithm    ----------------------------     ------------------------------          PCT < 0.25 ng/mL                 PCT < 0.1 ng/mL     Stopping of antibiotics            Stopping of antibiotics       strongly encouraged.               strongly encouraged.    ----------------------------     ------------------------------       PCT level decrease by               PCT < 0.25 ng/mL       >= 80% from peak PCT       OR PCT 0.25 - 0.5 ng/mL          Stopping of antibiotics                                             encouraged.     Stopping of antibiotics           encouraged.    ----------------------------     ------------------------------       PCT level decrease by  PCT >= 0.25 ng/mL       < 80% from peak PCT        AND PCT >= 0.5 ng/mL            Continuing antibiotics                                               encouraged.       Continuing antibiotics            encouraged.    ----------------------------     ------------------------------     PCT level increase compared          PCT > 0.5 ng/mL         with peak PCT AND          PCT >= 0.5 ng/mL             Escalation of antibiotics                                          strongly encouraged.      Escalation of antibiotics        strongly encouraged.   Glucose, capillary     Status: None   Collection Time: 10/22/15  8:18 PM  Result Value Ref Range   Glucose-Capillary 99 65 - 99 mg/dL  Troponin I (q 6hr x 3)     Status: Abnormal   Collection Time: 10/23/15 12:56 AM  Result Value Ref Range   Troponin I 0.06 (HH) <0.03 ng/mL    Comment: CRITICAL RESULT CALLED TO, READ BACK BY AND VERIFIED WITH: T.THOMPSON,RN 0151 10/23/15 M.CAMPBELL   CBC with Differential/Platelet     Status: Abnormal   Collection Time: 10/23/15  9:14 AM  Result Value Ref Range   WBC 18.5 (H) 4.0 - 10.5 K/uL    Comment: WHITE COUNT CONFIRMED ON SMEAR   RBC 4.06 (L) 4.22 - 5.81 MIL/uL   Hemoglobin 11.1 (L) 13.0 - 17.0 g/dL    Comment: REPEATED TO VERIFY   HCT 33.2 (L) 39.0 - 52.0 %   MCV 81.8 78.0 - 100.0 fL   MCH  27.3 26.0 - 34.0 pg   MCHC 33.4 30.0 - 36.0 g/dL   RDW 15.8 (H) 11.5 - 15.5 %   Platelets 82 (L) 150 - 400 K/uL    Comment: SPECIMEN CHECKED FOR CLOTS PLATELET COUNT CONFIRMED BY SMEAR    Neutrophils Relative % 88 %   Lymphocytes Relative 6 %   Monocytes Relative 6 %   Eosinophils Relative 0 %   Basophils Relative 0 %   Neutro Abs 16.3 (H) 1.7 - 7.7 K/uL   Lymphs Abs 1.1 0.7 - 4.0 K/uL   Monocytes Absolute 1.1 (H) 0.1 - 1.0 K/uL   Eosinophils Absolute 0.0 0.0 - 0.7 K/uL   Basophils Absolute 0.0 0.0 - 0.1 K/uL   RBC Morphology BURR CELLS    WBC Morphology MILD LEFT SHIFT (1-5% METAS, OCC MYELO, OCC BANDS)     Comment: TOXIC GRANULATION DOHLE BODIES   Troponin I (q 6hr x 3)     Status: Abnormal   Collection Time: 10/23/15  9:14 AM  Result Value Ref Range   Troponin I 0.10 (HH) <0.03 ng/mL    Comment: CRITICAL VALUE  NOTED.  VALUE IS CONSISTENT WITH PREVIOUSLY REPORTED AND CALLED VALUE.  Procalcitonin     Status: None   Collection Time: 10/23/15  9:14 AM  Result Value Ref Range   Procalcitonin 10.77 ng/mL    Comment:        Interpretation: PCT >= 10 ng/mL: Important systemic inflammatory response, almost exclusively due to severe bacterial sepsis or septic shock. (NOTE)         ICU PCT Algorithm               Non ICU PCT Algorithm    ----------------------------     ------------------------------         PCT < 0.25 ng/mL                 PCT < 0.1 ng/mL     Stopping of antibiotics            Stopping of antibiotics       strongly encouraged.               strongly encouraged.    ----------------------------     ------------------------------       PCT level decrease by               PCT < 0.25 ng/mL       >= 80% from peak PCT       OR PCT 0.25 - 0.5 ng/mL          Stopping of antibiotics                                             encouraged.     Stopping of antibiotics           encouraged.    ----------------------------     ------------------------------       PCT  level decrease by              PCT >= 0.25 ng/mL       < 80% from peak PCT        AND PCT >= 0.5 ng/mL             Continuing antibiotics                                              encouraged.       Continuing antibiotics            encouraged.    ----------------------------     ------------------------------     PCT level increase compared          PCT > 0.5 ng/mL         with peak PCT AND          PCT >= 0.5 ng/mL             Escalation of antibiotics                                          strongly encouraged.      Escalation of antibiotics        strongly encouraged.   APTT     Status: Abnormal   Collection  Time: 10/23/15  9:14 AM  Result Value Ref Range   aPTT 40 (H) 24 - 37 seconds    Comment:        IF BASELINE aPTT IS ELEVATED, SUGGEST PATIENT RISK ASSESSMENT BE USED TO DETERMINE APPROPRIATE ANTICOAGULANT THERAPY.   Protime-INR     Status: Abnormal   Collection Time: 10/23/15  9:14 AM  Result Value Ref Range   Prothrombin Time 16.8 (H) 11.6 - 15.2 seconds   INR 1.35 0.00 - 1.49  HIV antibody     Status: None   Collection Time: 10/23/15  9:14 AM  Result Value Ref Range   HIV Screen 4th Generation wRfx Non Reactive Non Reactive    Comment: (NOTE) Performed At: [facility] [address] [place], [place] [id number] Lindon Romp MD DB:[id number]     Ct Head Wo Contrast  10/23/2015  CLINICAL DATA:  Multiple abscesses. Febrile. Altered mental status. EXAM: CT HEAD WITHOUT CONTRAST TECHNIQUE: Contiguous axial images were obtained from the base of the skull through the vertex without intravenous contrast. COMPARISON:  None. FINDINGS: No acute cortical infarct, hemorrhage, or mass lesion is present. Ventricles are of normal size. No significant extra-axial fluid collection is present. Partial opacification with air-fluid level involves the right maxillary sinus. The mastoid air cells appear clear. The osseous skull is intact. IMPRESSION: 1. No acute  intracranial abnormalities. 2. Right maxillary sinus opacification with air-fluid level. Electronically Signed   By: Kerby Moors M.D.   On: 10/23/2015 17:05   Dg Chest Port 1 View  10/22/2015  CLINICAL DATA:  Acute respiratory failure. EXAM: PORTABLE CHEST 1 VIEW COMPARISON:  October 22, 2015 FINDINGS: An ET tube is in good position. The heart, hila, and mediastinum are normal. No pulmonary nodules or masses. Suggested mild left perihilar opacity. No other acute abnormalities. IMPRESSION: Suggested mild left perihilar opacity. Recommend follow-up to resolution. Electronically Signed   By: Dorise Bullion III M.D   On: 10/22/2015 18:58   Dg Hand Complete Right  10/23/2015  CLINICAL DATA:  Soft tissue swelling dorsally EXAM: RIGHT HAND - COMPLETE 3+ VIEW COMPARISON:  CT right hand October 22, 2015 FINDINGS: Frontal, oblique and lateral views were obtained. There is soft tissue swelling medially and dorsally. No air-fluid level or radiopaque foreign body seen. No fracture or dislocation. The joint spaces appear normal. No erosive change evident. IMPRESSION: Soft tissue swelling medially and dorsally without air or radiopaque foreign body apparent. No demonstrable arthropathy. No fracture or dislocation. Electronically Signed   By: Lowella Grip III M.D.   On: 10/23/2015 14:39     A comprehensive review of systems was negative except for: Constitutional: positive for chills, fevers and night sweats Respiratory: positive for shortness of breath Cardiovascular: positive for chest pain Gastrointestinal: positive for constipation and nausea Neurological: positive for headaches   Blood pressure 135/67, pulse 157, temperature 103.6 F (39.8 C), temperature source Core (Comment), resp. rate 27, height '5\' 11"'  (1.803 m), weight 83.3 kg (183 lb 10.3 oz), SpO2 91 %.  General appearance: alert, cooperative, appears stated age and moderate distress Head: Normocephalic, without obvious abnormality,  atraumatic Neck: supple, symmetrical, trachea midline Extremities: Intact sensation and capillary refill all digits.  +epl/fpl/io.  Swelling and erythema of dorsum of right hand.  No proximal streaking  No open wounds.  Tender to palpation.  Austin to move wrist some, causing pain in dorsum of hand.  Pain in elbow and shoulder.  No swelling or erythema of right elbow.  Left  hand with swelling, erythema, and pain of thenar eminence.  Also some erythema and pain at dorsum of hand over ring mp joint.  No open wounds.  No proximal streaking.   Pulses: 2+ and symmetric Skin: as above.  track marks on right arm. Neurologic: Grossly normal Incision/Wound: none  Assessment/Plan Bilateral hand abscesses.  Recommend OR for incision and drainage bilateral hands including thenar eminence of left.  Risks, benefits, and alternatives of surgery were discussed and the patient agrees with the plan of care.   Kaylynne Andres R 10/23/2015, 6:01 PM

## 2015-10-23 NOTE — Op Note (Signed)
10/22/2015 - 10/23/2015  7:29 PM  PATIENT:  Kevin Austin    PRE-OPERATIVE DIAGNOSIS:  Right Hand and Arm  POST-OPERATIVE DIAGNOSIS:  Same  PROCEDURE:  IRRIGATION AND DEBRIDEMENT EXTREMITY/HAND AND ARM, IRRIGATION AND DEBRIDEMENT FOOT  SURGEON:  Brenna Friesenhahn, Jewel BaizeIMOTHY D, MD  ASSISTANT: Aquilla HackerHenry Martensen, PA-C, She was present and scrubbed throughout the case, critical for completion in a timely fashion, and for retraction, instrumentation, and closure.   ANESTHESIA:   gen  PREOPERATIVE INDICATIONS:  Kevin Austin is a  25 y.o. male with a diagnosis of Right Hand and Arm who failed conservative measures and elected for surgical management.    The risks benefits and alternatives were discussed with the patient preoperatively including but not limited to the risks of infection, bleeding, nerve injury, cardiopulmonary complications, the need for revision surgery, among others, and the patient was willing to proceed.  OPERATIVE IMPLANTS: none  OPERATIVE FINDINGS: purulent abscess extending across fourth and fifth extensor tendons as well as to the plantar surface of his foot  BLOOD LOSS: 20  COMPLICATIONS: none  TOURNIQUET TIME: none  OPERATIVE PROCEDURE:  Patient was identified in the preoperative holding area and site was marked by me He was transported to the operating theater and placed on the table in supine position taking care to pad all bony prominences. After a preincinduction time out anesthesia was induced. The left lower extremity was prepped and draped in normal sterile fashion and a pre-incision timeout was performed. He received preoperative antibiotics.   I made a 4 cm incision over the center of this fluctuant obtain his fourth and fifth tarsal heads. Neither purulent fluid was expressed. The pocket extended across the dorsum of both metatarsals. Identified his extensor tendons and freed these up and soft tissue to confirm that they were stable and intact. A performed in the rectum obtain  a lysis to both of these tendons.  I then put a probe within the metatarsal was purulent fluid expressible here I opened up this space as much as possible as well as probing the plantar surface of the foot toes, but expressed all purulent pocket.  Addendum debrided any necrotic tissue this is an excisional debridement with scissors and knife.  Then thoroughly irrigated his wound with a liter of saline. I packed it with iodoform packing and loosely closed his incision. Sterile dressing was applied his left intubated as per the plan please see Dr. trismus note for his portion of the procedure  POST OPERATIVE PLAN: remain intubated on critical care.     This note was generated using a template and dragon dictation system. In light of that, I have reviewed the note and all aspects of it are applicable to this case. Any dictation errors are due to the computerized dictation system.

## 2015-10-23 NOTE — Progress Notes (Signed)
This is a 25 year old with history of IV drug use. CSW following for substance abuse assessment and resources once patient is medically stable. CSW will also facilitate disposition to SNF if continued IV antibiotics are required.          Lance MussAshley Gardner,MSW, LCSW [facility] Clinical Social Worker [phone]
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
## 2015-10-23 NOTE — Progress Notes (Signed)
Patient self extubated. He is alert and oriented and sats 92% on room air.

## 2015-10-23 NOTE — Progress Notes (Signed)
eLink Physician-Brief Progress Note Patient Name: Kevin Austin DOB: 21-Mar-1991 MRN: [id number]   Date of Service  10/23/2015  HPI/Events of Note  High fever, sinus tach  eICU Interventions  PRN ibuprofen     Intervention Category Major Interventions: Sepsis - evaluation and management  Billy FischerDavid Simonds 10/23/2015, 4:39 PM

## 2015-10-23 NOTE — Anesthesia Preprocedure Evaluation (Addendum)
Anesthesia Evaluation  Patient identified by MRN, date of birth, ID band Patient awake  General Assessment Comment:Iv drug user c septic emboli throughout organs.  Reviewed: Allergy & Precautions, NPO status , Patient's Chart, lab work & pertinent test results  Airway Mallampati: II  TM Distance: >3 FB Neck ROM: Full    Dental   Pulmonary  Recent resp insufficiency   breath sounds clear to auscultation+ rhonchi        Cardiovascular  Rhythm:Regular Rate:Tachycardia  tachycardia   Neuro/Psych    GI/Hepatic (+)     substance abuse  IV drug use,   Endo/Other    Renal/GU      Musculoskeletal   Abdominal   Peds  Hematology   Anesthesia Other Findings   Reproductive/Obstetrics                            Anesthesia Physical Anesthesia Plan  ASA: III and emergent  Anesthesia Plan: General   Post-op Pain Management:    Induction: Intravenous  Airway Management Planned: Oral ETT  Additional Equipment:   Intra-op Plan:   Post-operative Plan: Possible Post-op intubation/ventilation  Informed Consent: I have reviewed the patients History and Physical, chart, labs and discussed the procedure including the risks, benefits and alternatives for the proposed anesthesia with the patient or authorized representative who has indicated his/her understanding and acceptance.   Dental advisory given  Plan Discussed with: CRNA and Surgeon  Anesthesia Plan Comments:        Anesthesia Quick Evaluation

## 2015-10-23 NOTE — Transfer of Care (Signed)
Immediate Anesthesia Transfer of Care Note  Patient: Kevin Austin  Procedure(s) Performed: Procedure(s): IRRIGATION AND DEBRIDEMENT EXTREMITY/HAND AND ARM (Bilateral) IRRIGATION AND DEBRIDEMENT FOOT (Left)  Patient Location: ICU  Anesthesia Type:General  Level of Consciousness: sedated, unresponsive and Patient remains intubated per anesthesia plan  Airway & Oxygen Therapy: Patient remains intubated per anesthesia plan and Patient placed on Ventilator (see vital sign flow sheet for setting)  Post-op Assessment: Report given to RN and Post -op Vital signs reviewed and stable  Post vital signs: Reviewed and stable  Last Vitals:  Filed Vitals:   10/23/15 1600 10/23/15 1700  BP: 135/67 121/64  Pulse: 157 146  Temp: 39.8 C 38.1 C  Resp: 27 26    Last Pain:  Filed Vitals:   10/23/15 1832  PainSc: 9          Complications: No apparent anesthesia complications

## 2015-10-23 NOTE — Progress Notes (Signed)
Patient back from OR, pt placed back on previous ventilator settings , 28RR, +5Peep, and 30% FiO2. Pt is tolerating it well and stable at this time.

## 2015-10-23 NOTE — Consult Note (Signed)
ORTHOPAEDIC CONSULTATION  REQUESTING PHYSICIAN: Brand Males, MD  Chief Complaint: foot abscess   HPI: Kevin Austin is a 25 y.o. male who has been admitted to the critical care unit with sepsis and septic emboli multiple abscesses. He was taken to the operating room by Dr. Maryjean Morn in the prior I&D of bilateral hand abscesses he has a history of IV drug use. I was called intraoperatively as he was discovered to have a foot abscess that would likely need I&D. Given his poor health discussion with the anesthesia team minimizing the need for recurrent anesthetic events as as to perform an emergency I&D of this foot as well.  No past medical history on file. No past surgical history on file. Social History   Social History  . Marital Status: Single    Spouse Name: N/A  . Number of Children: N/A  . Years of Education: N/A   Social History Main Topics  . Smoking status: Not on file  . Smokeless tobacco: Not on file  . Alcohol Use: Not on file  . Drug Use: Not on file  . Sexual Activity: Not on file   Other Topics Concern  . Not on file   Social History Narrative  . No narrative on file   No family history on file. Allergies  Allergen Reactions  . Ketorolac Nausea Only    Per [facility] records  . Tramadol Nausea Only    Per [facility] records   Prior to Admission medications   Medication Sig Start Date End Date Taking? Authorizing Provider  acetaminophen (TYLENOL) 325 MG tablet Take 650 mg by mouth every 6 (six) hours as needed for mild pain.   Yes Historical Provider, MD   Ct Head Wo Contrast  10/23/2015  CLINICAL DATA:  Multiple abscesses. Febrile. Altered mental status. EXAM: CT HEAD WITHOUT CONTRAST TECHNIQUE: Contiguous axial images were obtained from the base of the skull through the vertex without intravenous contrast. COMPARISON:  None. FINDINGS: No acute cortical infarct, hemorrhage, or mass lesion is present. Ventricles are of normal size. No significant extra-axial  fluid collection is present. Partial opacification with air-fluid level involves the right maxillary sinus. The mastoid air cells appear clear. The osseous skull is intact. IMPRESSION: 1. No acute intracranial abnormalities. 2. Right maxillary sinus opacification with air-fluid level. Electronically Signed   By: Kerby Moors M.D.   On: 10/23/2015 17:05   Dg Chest Port 1 View  10/22/2015  CLINICAL DATA:  Acute respiratory failure. EXAM: PORTABLE CHEST 1 VIEW COMPARISON:  October 22, 2015 FINDINGS: An ET tube is in good position. The heart, hila, and mediastinum are normal. No pulmonary nodules or masses. Suggested mild left perihilar opacity. No other acute abnormalities. IMPRESSION: Suggested mild left perihilar opacity. Recommend follow-up to resolution. Electronically Signed   By: Dorise Bullion III M.D   On: 10/22/2015 18:58   Dg Hand Complete Right  10/23/2015  CLINICAL DATA:  Soft tissue swelling dorsally EXAM: RIGHT HAND - COMPLETE 3+ VIEW COMPARISON:  CT right hand October 22, 2015 FINDINGS: Frontal, oblique and lateral views were obtained. There is soft tissue swelling medially and dorsally. No air-fluid level or radiopaque foreign body seen. No fracture or dislocation. The joint spaces appear normal. No erosive change evident. IMPRESSION: Soft tissue swelling medially and dorsally without air or radiopaque foreign body apparent. No demonstrable arthropathy. No fracture or dislocation. Electronically Signed   By: Lowella Grip III M.D.   On: 10/23/2015 14:39    Positive ROS:  All other systems have been reviewed and were otherwise negative with the exception of those mentioned in the HPI and as above.  Labs cbc  Recent Labs  10/23/15 0914  WBC 18.5*  HGB 11.1*  HCT 33.2*  PLT 82*    Labs inflam No results for input(s): CRP in the last 72 hours.  Invalid input(s): ESR  Labs coag  Recent Labs  10/23/15 0914  INR 1.35     Recent Labs  10/22/15 1844  NA 133*  K 3.5  CL  101  CO2 22  GLUCOSE 92  BUN 14  CREATININE 0.85  CALCIUM 7.6*    Physical Exam: Filed Vitals:   10/23/15 1600 10/23/15 1700  BP: 135/67 121/64  Pulse: 157 146  Temp: 103.6 F (39.8 C) 100.6 F (38.1 C)  Resp: 27 26   General: stable Cardiovascular: No pedal edema Respiratory: intubated GI: No organomegaly, abdomen is soft Skin: No lesions in the area of chief complaint other than those listed below in MSK exam.    MUSCULOSKELETAL:  Left lower extremity he has erythema surrounding fluid and fluctuance on the dorsum of his foot and the cutaneous fourth and fifth metatarsal and metatarsal head. He has 2+ pulses. Toes are warm and well-perfused Other extremities are atraumatic with painless ROM and NVI.  Assessment: Left foot abscess Vomiting extensor tendons to the fourth and fifth toes  Plan: I will perform an emergent I&D of this abscess as well as possible to do lysis as needed Continue critical care per the critical care team postoperatively   Renette Butters, MD Cell [phone]   10/23/2015 7:26 PM

## 2015-10-23 NOTE — Progress Notes (Signed)
Nutrition Consult  Received MD Consult for TF recommendations while patient was intubated. Patient self-extubated this morning. He has no enteral access. He is currently NPO for multiple procedures / tests (head CT, TEE, I&D) per discussion with RN. Plans for diet advancement after these procedures. No indication for TF at this time. Please re-consult RD if nutrition concerns arise.  Joaquin CourtsKimberly Harris, RD, LDN, CNSC Pager [phone] After Hours Pager [phone]

## 2015-10-23 NOTE — Brief Op Note (Signed)
10/22/2015 - 10/23/2015  8:21 PM  PATIENT:  Kevin Austin  25 y.o. male  PRE-OPERATIVE DIAGNOSIS:  Right Hand and Arm abscess, left hand abscess, and left foot abscess  POST-OPERATIVE DIAGNOSIS:  Right Hand and Arm abscess, left hand abscess, and left foot abscess  PROCEDURE:  Procedure(s): IRRIGATION AND DEBRIDEMENT EXTREMITY/HAND AND ARM (Bilateral) IRRIGATION AND DEBRIDEMENT FOOT (Left) Incision and drainage left palm and dorsum of hand SURGEON:  Surgeon(s) and Role:    * Sheral Apleyimothy D Murphy, MD    * [name], MD - Primary  PHYSICIAN ASSISTANT:   ASSISTANTS: none   ANESTHESIA:   general  EBL:  Total I/O In: 1800 [I.V.:1800] Out: 55 [Urine:50; Blood:5]  BLOOD ADMINISTERED:none  DRAINS: iodoform packing  LOCAL MEDICATIONS USED:  NONE  SPECIMEN:  Source of Specimen:  right hand  DISPOSITION OF SPECIMEN:  micro  COUNTS:  YES  TOURNIQUET:   Total Tourniquet Time Documented: Upper Arm (Right) - 35 minutes Total: Upper Arm (Right) - 35 minutes  Forearm (Left) - 18 minutes Total: Forearm (Left) - 18 minutes   DICTATION: .Other Dictation: Dictation Number [id number]  PLAN OF CARE: return to ICU  PATIENT DISPOSITION:  return to ICU   Delay start of Pharmacological VTE agent (>24hrs) due to surgical blood loss or risk of bleeding: no

## 2015-10-23 NOTE — OR Nursing (Signed)
Gerlene FeeValerie Gibson, RN charted under [employer] Yafet Cline's , RN log in from 1910 till end of the case.

## 2015-10-23 NOTE — Progress Notes (Signed)
eLink Physician-Brief Progress Note Patient Name: Jolene Schimkerey Prim DOB: 1991/02/21 MRN: [id number]   Date of Service  10/23/2015  HPI/Events of Note  Troponin = 0.24 >> 0.06. Demand ischemia?  eICU Interventions  Continue present management.      Intervention Category Intermediate Interventions: Diagnostic test evaluation  Sommer,Steven Eugene 10/23/2015, 2:36 AM

## 2015-10-23 NOTE — Anesthesia Procedure Notes (Signed)
Procedure Name: Intubation Date/Time: 10/23/2015 6:18 PM Performed by: Adonis HousekeeperNGELL, Miya Luviano M Pre-anesthesia Checklist: Patient identified, Emergency Drugs available, Suction available, Patient being monitored and Timeout performed Patient Re-evaluated:Patient Re-evaluated prior to inductionOxygen Delivery Method: Circle system utilized Preoxygenation: Pre-oxygenation with 100% oxygen Intubation Type: IV induction Laryngoscope Size: Mac and 3 Grade View: Grade I Tube type: Oral Tube size: 7.5 mm Number of attempts: 1 Placement Confirmation: positive ETCO2,  CO2 detector,  breath sounds checked- equal and bilateral and ETT inserted through vocal cords under direct vision Secured at: 22 cm Tube secured with: Tape Dental Injury: Teeth and Oropharynx as per pre-operative assessment

## 2015-10-23 NOTE — Op Note (Signed)
[id number] 

## 2015-10-23 NOTE — Progress Notes (Addendum)
    [facility] has been requested to perform a transesophageal echocardiogram on 10/24/2015 with Dr. Shirlee LatchMcLean for Bactermia.  After careful review of history and examination, the risks and benefits of transesophageal echocardiogram have been explained including risks of esophageal damage, perforation (1:10,000 risk), bleeding, pharyngeal hematoma as well as other potential complications associated with conscious sedation including aspiration, arrhythmia, respiratory failure and death. Alternatives to treatment were discussed, questions were answered. Patient is willing to proceed.   Kevin Austin has a hx of IV drug use, and was admitted from [facility] with multiple septic emboli in the kidneys, lungs and dorsum of right hand. Was intubated prior to [facility], but self extubated as of this morning. He is not currently on any pressers, Hgb 11.1, and platelet count 82,000. There was originally consult for TF by nutrition, but patient is now extubated and plans to reassess diet after multiple procedures are completed. Of note he is tachycardiac on telemetry. Would reassess heart rate prior to procedure.   Laverda PageLindsay Ori Trejos, NP-C 10/23/2015 4:40 PM

## 2015-10-23 NOTE — Progress Notes (Signed)
eLink Physician-Brief Progress Note Patient Name: Kevin Austin DOB: 17-Sep-1990 MRN: [id number]   Date of Service  10/23/2015  HPI/Events of Note  Pt returned from OR intubated  eICU Interventions  Vent and PAD orders     Intervention Category Major Interventions: Respiratory failure - evaluation and management  Billy FischerDavid Avaleigh Decuir 10/23/2015, 8:21 PM

## 2015-10-23 NOTE — Progress Notes (Signed)
PT self extubated   

## 2015-10-23 NOTE — Care Management Note (Signed)
Case Management Note  Patient Details  Name: Jolene Schimkerey Nemetz MRN: [id number] Date of Birth: [birth year]  Subjective/Objective:   Pt [facility] on 6/26 with pain all over the body. Found to have multiple septic emboli in the lungs, kidneys, dorsum of right hand - possible endocarditis (hx of IV drug use). He was found to be in sinus tachycardia with a temperature 104.6. Intubated before [facility].                 Action/Plan:  Pt may need discharge to SNF if continued IV antibiotics are required.  CM will continue to monitor for discharge needs   Expected Discharge Date:                  Expected Discharge Plan:  Skilled Nursing Facility  In-House Referral:  Clinical Social Work  Discharge planning Services  CM Consult  Post Acute Care Choice:    Choice offered to:     DME Arranged:    DME Agency:     HH Arranged:    HH Agency:     Status of Service:  In process, will continue to follow  If discussed at Long Length of Stay Meetings, dates discussed:    Additional Comments:  Cherylann ParrClaxton, Nari Vannatter S, RN 10/23/2015, 11:19 AM

## 2015-10-23 NOTE — Anesthesia Postprocedure Evaluation (Signed)
Anesthesia Post Note  Patient: Kevin Austin  Procedure(s) Performed: Procedure(s) (LRB): IRRIGATION AND DEBRIDEMENT EXTREMITY/HAND AND ARM (Bilateral) IRRIGATION AND DEBRIDEMENT FOOT (Left)  Patient location during evaluation: PACU Anesthesia Type: General Level of consciousness: awake and alert Pain management: pain level controlled Vital Signs Assessment: post-procedure vital signs reviewed and stable Respiratory status: spontaneous breathing, nonlabored ventilation, respiratory function stable and patient connected to nasal cannula oxygen Cardiovascular status: blood pressure returned to baseline and stable Postop Assessment: no signs of nausea or vomiting Anesthetic complications: no    Last Vitals:  Filed Vitals:   10/23/15 2045 10/23/15 2100  BP: 161/99 175/97  Pulse: 132 136  Temp: 37.3 C 37.4 C  Resp: 20 22    Last Pain:  Filed Vitals:   10/23/15 2110  PainSc: 9                  Calisha Tindel DAVID

## 2015-10-23 NOTE — Progress Notes (Signed)
Wasted 200 cc of fentanyl in sink. Witnessed with second Museum/gallery curatorN Katrice.

## 2015-10-23 NOTE — Op Note (Signed)
Intra-operative fluoroscopic images of the left hand in the AP, lateral, and oblique views were taken and evaluated by myself.  There were no fractures, dislocations, radioopaque foreign bodies.
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
## 2015-10-24 ENCOUNTER — Inpatient Hospital Stay (HOSPITAL_COMMUNITY): Payer: Medicaid Other

## 2015-10-24 ENCOUNTER — Encounter (HOSPITAL_COMMUNITY): Payer: Self-pay | Admitting: Orthopedic Surgery

## 2015-10-24 DIAGNOSIS — A4102 Sepsis due to Methicillin resistant Staphylococcus aureus: Principal | ICD-10-CM

## 2015-10-24 DIAGNOSIS — F1990 Other psychoactive substance use, unspecified, uncomplicated: Secondary | ICD-10-CM

## 2015-10-24 DIAGNOSIS — R7881 Bacteremia: Secondary | ICD-10-CM

## 2015-10-24 DIAGNOSIS — R4182 Altered mental status, unspecified: Secondary | ICD-10-CM | POA: Diagnosis present

## 2015-10-24 DIAGNOSIS — Z452 Encounter for adjustment and management of vascular access device: Secondary | ICD-10-CM | POA: Diagnosis present

## 2015-10-24 DIAGNOSIS — B9562 Methicillin resistant Staphylococcus aureus infection as the cause of diseases classified elsewhere: Secondary | ICD-10-CM

## 2015-10-24 LAB — BASIC METABOLIC PANEL
ANION GAP: 12 (ref 5–15)
BUN: 12 mg/dL (ref 6–20)
CALCIUM: 7.6 mg/dL — AB (ref 8.9–10.3)
CO2: 20 mmol/L — AB (ref 22–32)
Chloride: 102 mmol/L (ref 101–111)
Creatinine, Ser: 0.53 mg/dL — ABNORMAL LOW (ref 0.61–1.24)
GFR calc Af Amer: >60 mL/min (ref >60)
GLUCOSE: 80 mg/dL (ref 65–99)
POTASSIUM: 4.2 mmol/L (ref 3.5–5.1)
Sodium: 134 mmol/L — ABNORMAL LOW (ref 135–145)

## 2015-10-24 LAB — BLOOD CULTURE ID PANEL (REFLEXED)
ACINETOBACTER BAUMANNII: NOT DETECTED
CANDIDA ALBICANS: NOT DETECTED
CANDIDA GLABRATA: NOT DETECTED
CANDIDA TROPICALIS: NOT DETECTED
Candida krusei: NOT DETECTED
Candida parapsilosis: NOT DETECTED
Carbapenem resistance: NOT DETECTED
ENTEROBACTER CLOACAE COMPLEX: NOT DETECTED
ENTEROCOCCUS SPECIES: NOT DETECTED
ESCHERICHIA COLI: NOT DETECTED
Enterobacteriaceae species: NOT DETECTED
HAEMOPHILUS INFLUENZAE: NOT DETECTED
Klebsiella oxytoca: NOT DETECTED
Klebsiella pneumoniae: NOT DETECTED
LISTERIA MONOCYTOGENES: NOT DETECTED
METHICILLIN RESISTANCE: NOT DETECTED
NEISSERIA MENINGITIDIS: NOT DETECTED
PROTEUS SPECIES: NOT DETECTED
Pseudomonas aeruginosa: NOT DETECTED
SERRATIA MARCESCENS: NOT DETECTED
STAPHYLOCOCCUS AUREUS BCID: DETECTED — AB
STREPTOCOCCUS PYOGENES: NOT DETECTED
STREPTOCOCCUS SPECIES: NOT DETECTED
Staphylococcus species: DETECTED — AB
Streptococcus agalactiae: NOT DETECTED
Streptococcus pneumoniae: NOT DETECTED
VANCOMYCIN RESISTANCE: NOT DETECTED

## 2015-10-24 LAB — CBC
HEMATOCRIT: 32.9 % — AB (ref 39.0–52.0)
HEMOGLOBIN: 11.1 g/dL — AB (ref 13.0–17.0)
MCH: 26.9 pg (ref 26.0–34.0)
MCHC: 33.7 g/dL (ref 30.0–36.0)
MCV: 79.7 fL (ref 78.0–100.0)
Platelets: DECREASED 10*3/uL (ref 150–400)
RBC: 4.13 MIL/uL — ABNORMAL LOW (ref 4.22–5.81)
RDW: 15.8 % — AB (ref 11.5–15.5)
WBC: 19.1 10*3/uL — ABNORMAL HIGH (ref 4.0–10.5)

## 2015-10-24 LAB — URINE CULTURE: Culture: 2000 — AB

## 2015-10-24 LAB — VANCOMYCIN, TROUGH: Vancomycin Tr: 9 ug/mL — ABNORMAL LOW (ref 15–20)

## 2015-10-24 LAB — PROCALCITONIN: PROCALCITONIN: 7.34 ng/mL

## 2015-10-24 MED ORDER — SODIUM CHLORIDE 0.9 % IV SOLN
0.0000 ug/h | INTRAVENOUS | Status: DC
  Administered 2015-10-24 (×2): 400 ug/h via INTRAVENOUS
  Administered 2015-10-24: 100 ug/h via INTRAVENOUS
  Administered 2015-10-25 (×3): 400 ug/h via INTRAVENOUS
  Administered 2015-10-26: 40 ug/h via INTRAVENOUS
  Administered 2015-10-26 – 2015-10-28 (×8): 400 ug/h via INTRAVENOUS
  Filled 2015-10-24 (×16): qty 50

## 2015-10-24 MED ORDER — MIDAZOLAM HCL 2 MG/2ML IJ SOLN
INTRAMUSCULAR | Status: AC
  Administered 2015-10-24: 2 mg
  Filled 2015-10-24: qty 2

## 2015-10-24 MED ORDER — MIDAZOLAM HCL 2 MG/2ML IJ SOLN: 2.0000 mg | Freq: Once | INTRAMUSCULAR | Status: AC

## 2015-10-24 MED ORDER — FENTANYL BOLUS VIA INFUSION
25.0000 ug | INTRAVENOUS | Status: DC | PRN
  Administered 2015-10-27 (×3): 250 ug via INTRAVENOUS
  Filled 2015-10-24 (×4): qty 50

## 2015-10-24 MED ORDER — VANCOMYCIN HCL 10 G IV SOLR
1250.0000 mg | Freq: Three times a day (TID) | INTRAVENOUS | Status: DC
  Administered 2015-10-24 – 2015-10-26 (×5): 1250 mg via INTRAVENOUS
  Filled 2015-10-24 (×7): qty 1250

## 2015-10-24 MED ORDER — DEXTROSE-NACL 5-0.45 % IV SOLN
INTRAVENOUS | Status: DC
  Administered 2015-10-24: 12:00:00 via INTRAVENOUS
  Administered 2015-10-24: 100 mL/h via INTRAVENOUS
  Administered 2015-10-25: 19:00:00 via INTRAVENOUS
  Administered 2015-10-25 – 2015-10-26 (×2): 100 mL/h via INTRAVENOUS
  Administered 2015-10-29: via INTRAVENOUS

## 2015-10-24 MED ORDER — FENTANYL CITRATE (PF) 100 MCG/2ML IJ SOLN
100.0000 ug | INTRAMUSCULAR | Status: DC | PRN
  Administered 2015-10-24: 100 ug via INTRAVENOUS

## 2015-10-24 NOTE — H&P (View-Only) (Signed)
[place] for Infectious Disease    Date of Admission:  10/22/2015   Total days of antibiotics 3        Day 3 vanco           ID: Kevin Austin is a 25 y.o. male with mrsa bacteremia with disseminated disease Active Problems:   Acute respiratory failure ([place])   Abscess    Subjective: Febrile and remains intubated  24hr: went to the OR for debridement of both hands and left foot  Medications:  . antiseptic oral rinse  7 mL Mouth Rinse QID  . chlorhexidine gluconate (SAGE KIT)  15 mL Mouth Rinse BID  . Chlorhexidine Gluconate Cloth  6 each Topical Q0600  . famotidine (PEPCID) IV  20 mg Intravenous Q12H  . heparin  5,000 Units Subcutaneous Q8H  . methadone  10 mg Oral Q8H  . mupirocin ointment  1 application Nasal BID  . sodium chloride  1,000 mL Intravenous Once  . vancomycin  1,000 mg Intravenous Q8H   Objective: Vital signs in last 24 hours: Temp:  [99 F (37.2 C)-103.6 F (39.8 C)] 100.6 F (38.1 C) (06/28 0800) Pulse Rate:  [116-157] 138 (06/28 0757) Resp:  [17-32] 27 (06/28 0800) BP: (121-175)/(64-110) 137/110 mmHg (06/28 0800) SpO2:  [90 %-100 %] 100 % (06/28 0757) FiO2 (%):  [30 %] 30 % (06/28 0757) Weight:  [194 lb 0.1 oz (88 kg)] 194 lb 0.1 oz (88 kg) (06/28 0515) Physical Exam  Constitutional: intubated, and sedated. He appears well-developed and well-nourished. No distress.  HENT: ETT in place Mouth/Throat: Oropharynx is clear and moist. No oropharyngeal exudate.  Cardiovascular: tachy, regular rhythm and normal heart sounds. Exam reveals no gallop and no friction rub.  No murmur heard.  Pulmonary/Chest: Effort normal and breath sounds normal. No respiratory distress. He has no wheezes.  Abdominal: Soft. Bowel sounds are normal. He exhibits no distension. There is no tenderness.  Ext: right and left hand wrapped, left foot wrapped Neurological: winces when examining sternum Skin: Skin is warm and dry. No rash noted. No erythema.  Psychiatric:  sedated    Lab Results  Recent Labs  10/22/15 1844 10/23/15 0914 10/24/15 0926  WBC  --  18.5* PENDING  HGB  --  11.1* 11.1*  HCT  --  33.2* 32.9*  NA 133*  --  134*  K 3.5  --  4.2  CL 101  --  102  CO2 22  --  20*  BUN 14  --  12  CREATININE 0.85  --  0.53*   Liver Panel  Recent Labs  10/22/15 1844  PROT 5.4*  ALBUMIN 1.8*  AST 52*  ALT 56  ALKPHOS 333*  BILITOT 3.0*   No results found for: ESRSEDRATE, POCTSEDRATE  Microbiology: 6/26 blood cx MRSA 6/27 tissue cx [facility]  Studies/Results: Ct Head Wo Contrast  10/23/2015  CLINICAL DATA:  Multiple abscesses. Febrile. Altered mental status. EXAM: CT HEAD WITHOUT CONTRAST TECHNIQUE: Contiguous axial images were obtained from the base of the skull through the vertex without intravenous contrast. COMPARISON:  None. FINDINGS: No acute cortical infarct, hemorrhage, or mass lesion is present. Ventricles are of normal size. No significant extra-axial fluid collection is present. Partial opacification with air-fluid level involves the right maxillary sinus. The mastoid air cells appear clear. The osseous skull is intact. IMPRESSION: 1. No acute intracranial abnormalities. 2. Right maxillary sinus opacification with air-fluid level. Electronically Signed   By: Kerby Moors M.D.   On:  10/23/2015 17:05   Dg Chest Port 1 View  10/22/2015  CLINICAL DATA:  Acute respiratory failure. EXAM: PORTABLE CHEST 1 VIEW COMPARISON:  October 22, 2015 FINDINGS: An ET tube is in good position. The heart, hila, and mediastinum are normal. No pulmonary nodules or masses. Suggested mild left perihilar opacity. No other acute abnormalities. IMPRESSION: Suggested mild left perihilar opacity. Recommend follow-up to resolution. Electronically Signed   By: Dorise Bullion III M.D   On: 10/22/2015 18:58   Dg Hand Complete Right  10/23/2015  CLINICAL DATA:  Soft tissue swelling dorsally EXAM: RIGHT HAND - COMPLETE 3+ VIEW COMPARISON:  CT right hand October 22, 2015  FINDINGS: Frontal, oblique and lateral views were obtained. There is soft tissue swelling medially and dorsally. No air-fluid level or radiopaque foreign body seen. No fracture or dislocation. The joint spaces appear normal. No erosive change evident. IMPRESSION: Soft tissue swelling medially and dorsally without air or radiopaque foreign body apparent. No demonstrable arthropathy. No fracture or dislocation. Electronically Signed   By: Lowella Grip III M.D.   On: 10/23/2015 14:39     Assessment/Plan: mrsa disseminated infection/bacteremia with septic emboli to lung, kidney c/b deep tissue abscess to right and left hand and left foot s/p debridement. Also CT reviewed to be c/w sternal osteo  mrsa bacteremia = keep on vancomycin alone. Need to check trough to see if therapeutic. Will repeat blood cx today. Need TEE for evaluation of heart valves? Need for surgical debridement  Sternal osteo = may need mri once stabilized, would prioritize tee first  Kidney with septic emboli = concern that may develop kidney abscess. Would recommend repeat imaging in a few days       [place] Antimicrobial Management Team Staphylococcus aureus bacteremia   Staphylococcus aureus bacteremia (SAB) is associated with a high rate of complications and mortality.  Specific aspects of clinical management are critical to optimizing the outcome of patients with SAB.  Therefore, the [facility] Antimicrobial Management Team [facility]) has initiated an intervention aimed at improving the management of SAB at [facility].  To do so, Infectious Diseases physicians are providing an evidence-based consult for the management of all patients with SAB.     Yes No Comments  Perform follow-up blood cultures (even if the patient is afebrile) to ensure clearance of bacteremia _0  _1  6/28       Perform echocardiography to evaluate for endocarditis (transthoracic ECHO is 40-50% sensitive, TEE is > 90% sensitive) _2  _3  Need TEE        Ensure source control _4  _5  Hand and foot debridement  Investigate for "metastatic" sites of infection _6  _7  At [place], he had c/a/p. May need repeat chest MRI to look at sternal osteo  Change antibiotic therapy to _____vancomycin___ _8  _9  Vancomycin, goal trough should be 15 - 20 mcg/mL  Estimated duration of IV antibiotic therapy:  6-8 wk _10  _11  Consult case management for probably prolonged outpatient IV antibiotic therapy     [facility] for Infectious Diseases Cell: [phone] Pager: [phone]  10/24/2015, 11:33 AM

## 2015-10-24 NOTE — Op Note (Signed)
[name]:  Catanzaro, Yuji                   ACCOUNT NO.:  [id number]  MEDICAL RECORD NO.:  [id number]  LOCATION:  2M13C                        FACILITY:  [facility]  PHYSICIAN:  Betha LoaKevin Gaylen Venning, MD        DATE OF BIRTH:  Aug 09, 1990  DATE OF PROCEDURE:  10/23/2015 DATE OF DISCHARGE:                              OPERATIVE REPORT   PREOPERATIVE DIAGNOSIS:  Right hand abscess and left hand abscess.  POSTOPERATIVE DIAGNOSIS:  Right hand dorsal deep abscess, left hand dorsal deep abscess, and thenar deep abscess.  PROCEDURE:   1. Right hand incision and drainage of dorsal hand deep abscess in 2 locations.   2. Left thenar eminence, incision and drainage of deep abscess 3. Dorsum of left hand incision and drainage of deep abscess.  SURGEON:  Betha LoaKevin Raneem Mendolia, MD.  ASSISTANT:  None.  ANESTHESIA:  General IV.  FLUIDS:  Per anesthesia flow sheet.  ESTIMATED BLOOD LOSS:  Minimal.  COMPLICATIONS:  None.  SPECIMENS:  Cultures to micro.  TOURNIQUET TIME:  35 minutes on the right, 18 minutes on the left.  DISPOSITION:  Return to the ICU intubated.  INDICATIONS:  Kevin Austin is a 25 year old right-hand dominant male, who states he has used IV meth.  He states he usually injects the right arm. He does not inject his hands.  Over the past several days, he has had increasing pain and swelling of the right hand and now of the left thenar eminence.  He has also noticed swelling and erythema on the left foot.  On examination, he was tender and swollen with erythema of both the dorsum of the right hand and the thenar eminence of the left hand as well as on the dorsum of the left hand over the ring finger metacarpal. I recommended incision and drainage in the operating room.  Risks, benefits, and alternatives of surgery were discussed including the risk of blood loss; infection; damage to nerves, vessels, tendons, ligaments, bone; failure of surgery; need for additional surgery; complications with wound healing;  continued pain; continued infection; need for repeat irrigation and debridement.  He voiced understanding of these risks and elected to proceed.  He also underwent incision and drainage of the left foot.  This was done by Dr. Renaye Rakersim Murphy and is dictated under separate note.  OPERATIVE COURSE:  After being identified preoperatively by myself, the patient and I agreed upon procedure and site of procedure.  He was transferred to the operating room and placed on operating table in supine position with the right upper extremity on arm board.  General anesthesia was induced by anesthesiologist.  Right upper extremity was prepped and draped in normal sterile orthopedic fashion.  Surgical pause was performed between surgeons, anesthesia, and operating staff, and all were in agreement as to the patient, procedure, and site of procedure. Tourniquet at the proximal aspect of the forearm was inflated to 250 mmHg after exsanguination of the limb with an Esmarch bandage.  Incision was made on the dorsum of the hand over the ring finger metacarpal and carried into subcutaneous tissues by spreading technique.  Gross purulence was encountered.  Bipolar electrocautery was used to obtain hemostasis.  Cultures were taken for aerobes and anaerobes.  The purulence was coming from deep to the extensor tendons.  It appeared to be coming from between the long and ring finger and ring and small finger metacarpals.  It seemed to be mostly between the ring and small finger metacarpals.  There was also a cavity that tracked toward the ulnar side of the small finger metacarpal.  The purulence was milked out.  The distal forearm was milked through the extensor compartments and no purulence was expressed from here.  An additional incision was made at the radial side of the index finger metacarpal and carried into the subcutaneous tissues by spreading technique.  No purulence was encountered in this location.  The wound  did communicate with the other wounds.  These wounds were copiously irrigated with 3000 mL of sterile saline by cysto tubing.  They were then packed with 1/2 inch iodoform gauze.  They were dressed with sterile 4 x 4s and ABD and wrapped with a Kerlix bandage.  Volar splint was later placed and wrapped with an Ace bandage.  Tourniquet was deflated at 35 minutes.  Fingertips were all pink with brisk capillary refill after deflation of tourniquet. Attention was turned to the left hand.  It was then sterilely prepped and draped.  Tourniquet at the proximal aspect of the forearm was inflated to 250 mmHg after exsanguination of the limb with an Esmarch bandage.  Incision was made over the thenar eminence.  This was carried into subcutaneous tissues by spreading technique.  Gross purulence was encountered.  This was from deep to the fascia and within the thenar muscle tissue.  It was debrided by milking the area.  All wounds on both hands were debrided with a combination of the operative sponge and the pickups.  This was done to the subcutaneous tissues and tissues deep to the fascia.  The purulence in the thenar eminence was removed.  The abscess cavity was defined.  An incision was made on the dorsum of the hand over the ring finger metacarpal and carried into subcutaneous tissues by spreading technique.  Again, gross purulence was encountered. This appeared to be coming from between the ring and long finger metacarpals.  The area was milked and no further purulence was encountered.  The wounds in left hand were copiously irrigated with 3000 mL of sterile saline by cysto tubing.  They were then packed with half- inch iodoform gauze.  They were dressed with sterile 4 x 4s and wrapped with a Kerlix bandage.  A volar splint was placed and wrapped with Kerlix and Ace bandage.  The tourniquet was deflated 18 minutes.  All fingertips were pink with brisk capillary refill after deflation  of tourniquet.  The operative drapes were broken down.  The patient was left intubated for procedure tomorrow.  He was returned to the ICU.  He will be continued on IV antibiotics.  He is under the care of the Critical Care team.     Betha LoaKevin Devian Bartolomei, MD     KK/MEDQ  D:  10/23/2015  T:  10/24/2015  Job:  [id number]

## 2015-10-24 NOTE — Progress Notes (Signed)
  [facility]    Date of Admission:  10/22/2015   Total days of antibiotics 3        Day 3 vanco           ID: Kevin Austin is a 25 y.o. male with mrsa bacteremia with disseminated disease Active Problems:   Acute respiratory failure (HCC)   Abscess    Subjective: Febrile and remains intubated  24hr: went to the OR for debridement of both hands and left foot  Medications:  . antiseptic oral rinse  7 mL Mouth Rinse QID  . chlorhexidine gluconate (SAGE KIT)  15 mL Mouth Rinse BID  . Chlorhexidine Gluconate Cloth  6 each Topical Q0600  . famotidine (PEPCID) IV  20 mg Intravenous Q12H  . heparin  5,000 Units Subcutaneous Q8H  . methadone  10 mg Oral Q8H  . mupirocin ointment  1 application Nasal BID  . sodium chloride  1,000 mL Intravenous Once  . vancomycin  1,000 mg Intravenous Q8H   Objective: Vital signs in last 24 hours: Temp:  [99 F (37.2 C)-103.6 F (39.8 C)] 100.6 F (38.1 C) (06/28 0800) Pulse Rate:  [116-157] 138 (06/28 0757) Resp:  [17-32] 27 (06/28 0800) BP: (121-175)/(64-110) 137/110 mmHg (06/28 0800) SpO2:  [90 %-100 %] 100 % (06/28 0757) FiO2 (%):  [30 %] 30 % (06/28 0757) Weight:  [194 lb 0.1 oz (88 kg)] 194 lb 0.1 oz (88 kg) (06/28 0515) Physical Exam  Constitutional: intubated, and sedated. He appears well-developed and well-nourished. No distress.  HENT: ETT in place Mouth/Throat: Oropharynx is clear and moist. No oropharyngeal exudate.  Cardiovascular: tachy, regular rhythm and normal heart sounds. Exam reveals no gallop and no friction rub.  No murmur heard.  Pulmonary/Chest: Effort normal and breath sounds normal. No respiratory distress. He has no wheezes.  Abdominal: Soft. Bowel sounds are normal. He exhibits no distension. There is no tenderness.  Ext: right and left hand wrapped, left foot wrapped Neurological: winces when examining sternum Skin: Skin is warm and dry. No rash noted. No erythema.  Psychiatric:  sedated    Lab Results  Recent Labs  10/22/15 1844 10/23/15 0914 10/24/15 0926  WBC  --  18.5* PENDING  HGB  --  11.1* 11.1*  HCT  --  33.2* 32.9*  NA 133*  --  134*  K 3.5  --  4.2  CL 101  --  102  CO2 22  --  20*  BUN 14  --  12  CREATININE 0.85  --  0.53*   Liver Panel  Recent Labs  10/22/15 1844  PROT 5.4*  ALBUMIN 1.8*  AST 52*  ALT 56  ALKPHOS 333*  BILITOT 3.0*   No results found for: ESRSEDRATE, POCTSEDRATE  Microbiology: 6/26 blood cx MRSA 6/27 tissue cx [facility]  Studies/Results: Ct Head Wo Contrast  10/23/2015  CLINICAL DATA:  Multiple abscesses. Febrile. Altered mental status. EXAM: CT HEAD WITHOUT CONTRAST TECHNIQUE: Contiguous axial images were obtained from the base of the skull through the vertex without intravenous contrast. COMPARISON:  None. FINDINGS: No acute cortical infarct, hemorrhage, or mass lesion is present. Ventricles are of normal size. No significant extra-axial fluid collection is present. Partial opacification with air-fluid level involves the right maxillary sinus. The mastoid air cells appear clear. The osseous skull is intact. IMPRESSION: 1. No acute intracranial abnormalities. 2. Right maxillary sinus opacification with air-fluid level. Electronically Signed   By: Taylor  Stroud M.D.   On:   10/23/2015 17:05   Dg Chest Port 1 View  10/22/2015  CLINICAL DATA:  Acute respiratory failure. EXAM: PORTABLE CHEST 1 VIEW COMPARISON:  October 22, 2015 FINDINGS: An ET tube is in good position. The heart, hila, and mediastinum are normal. No pulmonary nodules or masses. Suggested mild left perihilar opacity. No other acute abnormalities. IMPRESSION: Suggested mild left perihilar opacity. Recommend follow-up to resolution. Electronically Signed   By: David  Williams III M.D   On: 10/22/2015 18:58   Dg Hand Complete Right  10/23/2015  CLINICAL DATA:  Soft tissue swelling dorsally EXAM: RIGHT HAND - COMPLETE 3+ VIEW COMPARISON:  CT right hand October 22, 2015  FINDINGS: Frontal, oblique and lateral views were obtained. There is soft tissue swelling medially and dorsally. No air-fluid level or radiopaque foreign body seen. No fracture or dislocation. The joint spaces appear normal. No erosive change evident. IMPRESSION: Soft tissue swelling medially and dorsally without air or radiopaque foreign body apparent. No demonstrable arthropathy. No fracture or dislocation. Electronically Signed   By: William  Woodruff III M.D.   On: 10/23/2015 14:39     Assessment/Plan: mrsa disseminated infection/bacteremia with septic emboli to lung, kidney c/b deep tissue abscess to right and left hand and left foot s/p debridement. Also CT reviewed to be c/w sternal osteo  mrsa bacteremia = keep on vancomycin alone. Need to check trough to see if therapeutic. Will repeat blood cx today. Need TEE for evaluation of heart valves? Need for surgical debridement  Sternal osteo = may need mri once stabilized, would prioritize tee first  Kidney with septic emboli = concern that may develop kidney abscess. Would recommend repeat imaging in a few days       [place] [facility] Staphylococcus aureus bacteremia   Staphylococcus aureus bacteremia (SAB) is associated with a high rate of complications and mortality.  Specific aspects of clinical management are critical to optimizing the outcome of patients with SAB.  Therefore, the [place] [facility]imicrobial Management Team ([facility]) has initiated an intervention aimed at improving the management of SAB at [place].  To do so, Infectious Diseases physicians are providing an evidence-based consult for the management of all patients with SAB.     Yes No Comments  Perform follow-up blood cultures (even if the patient is afebrile) to ensure clearance of bacteremia [x] [] 6/28       Perform echocardiography to evaluate for endocarditis (transthoracic ECHO is 40-50% sensitive, TEE is > 90% sensitive) [x] [] Need TEE        Ensure source control [x] [] Hand and foot debridement  Investigate for "metastatic" sites of infection [x] [] At [place], he had c/a/p. May need repeat chest MRI to look at sternal osteo  Change antibiotic therapy to _____vancomycin___ [x] [] Vancomycin, goal trough should be 15 - 20 mcg/mL  Estimated duration of IV antibiotic therapy:  6-8 wk [x] [] Consult case management for probably prolonged outpatient IV antibiotic therapy     [facility] Cell: [phone] Pager: [phone]  10/24/2015, 11:33 AM     

## 2015-10-24 NOTE — Interval H&P Note (Signed)
History and Physical Interval Note:  10/24/2015 1:09 PM  Kevin Austin  has presented today for surgery, with the diagnosis of Right Hand and Arm abscess, left hand abscess, and left foot abscess  The various methods of treatment have been discussed with the patient and family. After consideration of risks, benefits and other options for treatment, the patient has consented to  Procedure(s): IRRIGATION AND DEBRIDEMENT EXTREMITY/HAND AND ARM (Bilateral) IRRIGATION AND DEBRIDEMENT FOOT (Left) as a surgical intervention .  The patient's history has been reviewed, patient examined, no change in status, stable for surgery.  I have reviewed the patient's chart and labs.  Questions were answered to the patient's satisfaction.     Dalton [employer]

## 2015-10-24 NOTE — Progress Notes (Addendum)
eLink Physician-Brief Progress Note Patient Name: Kevin Austin DOB: [birth year] MRN: [id number]   Date of Service  10/24/2015  HPI/Events of Note  Agitation - Already on Propofol IV infusion at maximum dose and Fentanyl IV PRN.   eICU Interventions  Will order: 1. Increase Fentanyl 100 mcg IV to Q 1 hour PRN.  2. Fentanyl IV infusion. Titrate to RASS = 0 to -1.     Intervention Category Minor Interventions: Agitation / anxiety - evaluation and management  Sommer,Steven Eugene 10/24/2015, 4:05 AM

## 2015-10-24 NOTE — Procedures (Signed)
Central Venous Catheter Insertion Procedure Note Jolene Schimkerey Budlong [id number] [birth year]  Procedure: Insertion of Central Venous Catheter Indications: Assessment of intravascular volume, Drug and/or fluid administration and Frequent blood sampling  Procedure Details Consent: Risks of procedure as well as the alternatives and risks of each were explained to the (patient/caregiver).  Consent for procedure obtained. Time Out: Verified patient identification, verified procedure, site/side was marked, verified correct patient position, special equipment/implants available, medications/allergies/relevent history reviewed, required imaging and test results available.  Performed  Maximum sterile technique was used including antiseptics, cap, gloves, gown, hand hygiene, mask and sheet. Skin prep: Chlorhexidine; local anesthetic administered A antimicrobial bonded/coated triple lumen catheter was placed in the left internal jugular vein using the Seldinger technique.  Evaluation Blood flow good Complications: No apparent complications Patient did tolerate procedure well. Chest X-ray ordered to verify placement.  CXR: pending.  Procedure performed under direct ultrasound guidance for real time vessel cannulation.      Rutherford Guysahul Alp Goldwater, GeorgiaPA - C [place] [facility] Pager: [phone]  or [phone] 10/24/2015, 9:03 PM
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
## 2015-10-24 NOTE — Progress Notes (Signed)
ANTIBIOTIC CONSULT NOTE - INITIAL  Pharmacy Consult for vancomycin Indication: bacteremia  Allergies  Allergen Reactions  . Ketorolac Nausea Only    Per [facility] records  . Tramadol Nausea Only    Per [facility] records    Patient Measurements: Height: '5\' 11"'  (180.3 cm) Weight: 194 lb 0.1 oz (88 kg) IBW/kg (Calculated) : 75.3 Adjusted Body Weight:   Vital Signs: Temp: 100.6 F (38.1 C) (06/28 1922) Temp Source: Rectal (06/28 1533) BP: 119/82 mmHg (06/28 1922) Pulse Rate: 141 (06/28 1922) Intake/Output from previous day: 06/27 0701 - 06/28 0700 In: 5004.1 [I.V.:3779.1; NG/GT:425; IV Piggyback:800] Out: 2230 [Urine:2225; Blood:5] Intake/Output from this shift:    Labs:  Recent Labs  10/22/15 1844 10/23/15 0914 10/24/15 0926  WBC  --  18.5* 19.1*  HGB  --  11.1* 11.1*  PLT  --  82* PLATELET CLUMPS NOTED ON SMEAR, COUNT APPEARS DECREASED  CREATININE 0.85  --  0.53*   Estimated Creatinine Clearance: 153 mL/min (by C-G formula based on Cr of 0.53).  Recent Labs  10/24/15 2035  [facility] 9*     Microbiology: Recent Results (from the past 720 hour(s))  MRSA PCR Screening     Status: Abnormal   Collection Time: 10/22/15  5:41 PM  Result Value Ref Range Status   MRSA by PCR POSITIVE (A) NEGATIVE Final    Comment:        The GeneXpert MRSA Assay (FDA approved for NASAL specimens only), is one component of a comprehensive MRSA colonization surveillance program. It is not intended to diagnose MRSA infection nor to guide or monitor treatment for MRSA infections. RESULT CALLED TO, READ BACK BY AND VERIFIED WITH: A THOMPSON,RN '@0110'  10/23/15 MKELLY   Urine culture     Status: Abnormal   Collection Time: 10/22/15  6:38 PM  Result Value Ref Range Status   Specimen Description URINE, CATHETERIZED  Final   Special Requests NONE  Final   Culture 2,000 COLONIES/mL INSIGNIFICANT GROWTH (A)  Final   Report Status 10/24/2015 FINAL  Final  Culture, blood (Routine X  2) w Reflex to ID Panel     Status: Abnormal (Preliminary result)   Collection Time: 10/22/15  6:44 PM  Result Value Ref Range Status   Specimen Description BLOOD LEFT HAND  Final   Special Requests BOTTLES DRAWN AEROBIC AND ANAEROBIC 5 CC   Final   Culture  Setup Time   Final    GRAM POSITIVE COCCI IN CLUSTERS IN BOTH AEROBIC AND ANAEROBIC BOTTLES CRITICAL RESULT CALLED TO, READ BACK BY AND VERIFIED WITH: C STEWART,PHARMD AT 1040 10/23/15 BY L BENFIELD    Culture STAPHYLOCOCCUS AUREUS (A)  Final   Report Status PENDING  Incomplete  Culture, blood (Routine X 2) w Reflex to ID Panel     Status: Abnormal (Preliminary result)   Collection Time: 10/22/15  7:02 PM  Result Value Ref Range Status   Specimen Description BLOOD LEFT HAND  Final   Special Requests BOTTLES DRAWN AEROBIC AND ANAEROBIC 5 CC   Final   Culture  Setup Time   Final    GRAM POSITIVE COCCI IN CLUSTERS IN BOTH AEROBIC AND ANAEROBIC BOTTLES CRITICAL RESULT CALLED TO, READ BACK BY AND VERIFIED WITH: C STEWART,PHARMD AT 1040 10/23/15 BY L BENFIELD    Culture STAPHYLOCOCCUS AUREUS (A)  Final   Report Status PENDING  Incomplete  Blood Culture ID Panel (Reflexed)     Status: Abnormal   Collection Time: 10/22/15  7:02 PM  Result Value Ref  Range Status   Enterococcus species NOT DETECTED NOT DETECTED Final   Vancomycin resistance NOT DETECTED NOT DETECTED Final   Listeria monocytogenes NOT DETECTED NOT DETECTED Final   Staphylococcus species DETECTED (A) NOT DETECTED Final    Comment: CRITICAL RESULT CALLED TO, READ BACK BY AND VERIFIED WITH: C STEWART,PHARMD AT 1040 10/23/15 BY L BENFIELD    Staphylococcus aureus DETECTED (A) NOT DETECTED Final    Comment: CRITICAL RESULT CALLED TO, READ BACK BY AND VERIFIED WITH: C STEWART,PHARMD AT 1040 10/23/15 BY L BENFIELD    Methicillin resistance DETECTED (A) NOT DETECTED Final    Comment: CRITICAL RESULT CALLED TO, READ BACK BY AND VERIFIED WITH: C STEWART,PHARMD AT 1040 10/23/15 BY L  BENFIELD    Streptococcus species NOT DETECTED NOT DETECTED Final   Streptococcus agalactiae NOT DETECTED NOT DETECTED Final   Streptococcus pneumoniae NOT DETECTED NOT DETECTED Final   Streptococcus pyogenes NOT DETECTED NOT DETECTED Final   Acinetobacter baumannii NOT DETECTED NOT DETECTED Final   Enterobacteriaceae species NOT DETECTED NOT DETECTED Final   Enterobacter cloacae complex NOT DETECTED NOT DETECTED Final   Escherichia coli NOT DETECTED NOT DETECTED Final   Klebsiella oxytoca NOT DETECTED NOT DETECTED Final   Klebsiella pneumoniae NOT DETECTED NOT DETECTED Final   Proteus species NOT DETECTED NOT DETECTED Final   Serratia marcescens NOT DETECTED NOT DETECTED Final   Carbapenem resistance NOT DETECTED NOT DETECTED Final   Haemophilus influenzae NOT DETECTED NOT DETECTED Final   Neisseria meningitidis NOT DETECTED NOT DETECTED Final   Pseudomonas aeruginosa NOT DETECTED NOT DETECTED Final   Candida albicans NOT DETECTED NOT DETECTED Final   Candida glabrata NOT DETECTED NOT DETECTED Final   Candida krusei NOT DETECTED NOT DETECTED Final   Candida parapsilosis NOT DETECTED NOT DETECTED Final   Candida tropicalis NOT DETECTED NOT DETECTED Final  Aerobic/Anaerobic Culture (surgical/deep wound)     Status: None (Preliminary result)   Collection Time: 10/23/15  7:09 PM  Result Value Ref Range Status   Specimen Description ABSCESS HAND RIGHT  Final   Special Requests PT ON VANC FORTAZ ZOSYN  Final   Gram Stain   Final    MODERATE WBC PRESENT,BOTH PMN AND MONONUCLEAR FEW GRAM POSITIVE COCCI IN PAIRS IN CLUSTERS    Culture MODERATE STAPHYLOCOCCUS AUREUS  Final   Report Status PENDING  Incomplete  Aerobic/Anaerobic Culture (surgical/deep wound)     Status: None (Preliminary result)   Collection Time: 10/23/15  7:17 PM  Result Value Ref Range Status   Specimen Description ABSCESS LEFT FOOT  Final   Special Requests PT ON VANC FORTAZ ZOSYN  Final   Gram Stain   Final     MODERATE WBC PRESENT,BOTH PMN AND MONONUCLEAR FEW GRAM POSITIVE COCCI IN CLUSTERS    Culture FEW STAPHYLOCOCCUS AUREUS  Final   Report Status PENDING  Incomplete  Culture, respiratory (NON-Expectorated)     Status: None (Preliminary result)   Collection Time: 10/24/15  3:29 AM  Result Value Ref Range Status   Specimen Description TRACHEAL ASPIRATE  Final   Special Requests Normal  Final   Gram Stain   Final    ABUNDANT WBC PRESENT, PREDOMINANTLY PMN RARE SQUAMOUS EPITHELIAL CELLS PRESENT RARE GRAM POSITIVE COCCI IN CLUSTERS    Culture PENDING  Incomplete   Report Status PENDING  Incomplete  Culture, blood (routine x 2)     Status: None (Preliminary result)   Collection Time: 10/24/15  2:05 PM  Result Value Ref Range Status  Specimen Description BLOOD RIGHT ARM  Final   Special Requests BOTTLES DRAWN AEROBIC AND ANAEROBIC 10CC EACH  Final   Culture PENDING  Incomplete   Report Status PENDING  Incomplete  Blood Culture ID Panel (Reflexed)     Status: Abnormal   Collection Time: 10/24/15  2:05 PM  Result Value Ref Range Status   Enterococcus species NOT DETECTED NOT DETECTED Final   Vancomycin resistance NOT DETECTED NOT DETECTED Final   Listeria monocytogenes NOT DETECTED NOT DETECTED Final   Staphylococcus species DETECTED (A) NOT DETECTED Final    Comment: CRITICAL RESULT CALLED TO, READ BACK BY AND VERIFIED WITH: DR Baxter Flattery 10/24/15 @ 1607 M VESTAL    Staphylococcus aureus DETECTED (A) NOT DETECTED Final    Comment: CRITICAL RESULT CALLED TO, READ BACK BY AND VERIFIED WITH: DR Baxter Flattery 10/24/15 @ 1607 M VESTAL    Methicillin resistance NOT DETECTED NOT DETECTED Final   Streptococcus species NOT DETECTED NOT DETECTED Final   Streptococcus agalactiae NOT DETECTED NOT DETECTED Final   Streptococcus pneumoniae NOT DETECTED NOT DETECTED Final   Streptococcus pyogenes NOT DETECTED NOT DETECTED Final   Acinetobacter baumannii NOT DETECTED NOT DETECTED Final   Enterobacteriaceae  species NOT DETECTED NOT DETECTED Final   Enterobacter cloacae complex NOT DETECTED NOT DETECTED Final   Escherichia coli NOT DETECTED NOT DETECTED Final   Klebsiella oxytoca NOT DETECTED NOT DETECTED Final   Klebsiella pneumoniae NOT DETECTED NOT DETECTED Final   Proteus species NOT DETECTED NOT DETECTED Final   Serratia marcescens NOT DETECTED NOT DETECTED Final   Carbapenem resistance NOT DETECTED NOT DETECTED Final   Haemophilus influenzae NOT DETECTED NOT DETECTED Final   Neisseria meningitidis NOT DETECTED NOT DETECTED Final   Pseudomonas aeruginosa NOT DETECTED NOT DETECTED Final   Candida albicans NOT DETECTED NOT DETECTED Final   Candida glabrata NOT DETECTED NOT DETECTED Final   Candida krusei NOT DETECTED NOT DETECTED Final   Candida parapsilosis NOT DETECTED NOT DETECTED Final   Candida tropicalis NOT DETECTED NOT DETECTED Final    Medical History: No past medical history on file.  Medications:  Scheduled:  . antiseptic oral rinse  7 mL Mouth Rinse QID  . chlorhexidine gluconate (SAGE KIT)  15 mL Mouth Rinse BID  . Chlorhexidine Gluconate Cloth  6 each Topical Q0600  . famotidine (PEPCID) IV  20 mg Intravenous Q12H  . heparin  5,000 Units Subcutaneous Q8H  . methadone  10 mg Oral Q8H  . mupirocin ointment  1 application Nasal BID  . sodium chloride  1,000 mL Intravenous Once  . vancomycin  1,250 mg Intravenous Q8H   Infusions:  . dextrose 5 % and 0.45% NaCl 100 mL/hr (10/24/15 2041)  . fentaNYL infusion INTRAVENOUS 400 mcg/hr (10/24/15 1947)  . propofol (DIPRIVAN) infusion 50 mcg/kg/min (10/24/15 1822)   Assessment: 25 yo male with bacteremia is currentkly on subtherapeutic vancomycin.  Vancomycin trough tonight is 9  Goal of Therapy:  Vancomycin trough level 15-20 mcg/ml  Plan:  - increase vancomycin to 1250 mg iv q8h - recheck vancomycin trough at steady state - monitor renal function  Shadeed Colberg, Tsz-Yin 10/24/2015,9:42 PM

## 2015-10-24 NOTE — CV Procedure (Signed)
Procedure: TEE  Indication: Staph aureus bacteremia  Sedation: Patient is on Propofol and Fentanyl gtts  Findings: Please see echo section for full report.  Normal LV size with EF 60-65%.  Normal wall motion.  Normal RV size and systolic function.  Trivial TR, no TV vegetation.  Trivial PI, no PV vegetation.  Trileaflet aortic valve with no stenosis, regurgitation, or vegetation.  No significant mitral regurgitation, no MV vegetation.  Normal atria.  No LA appendage thrombus.  No PFO by color doppler.  Normal caliber thoracic aorta.   No evidence for endocarditis.   Kevin Austin 10/24/2015 1:28 PM

## 2015-10-24 NOTE — Progress Notes (Signed)
eLink Physician-Brief Progress Note Patient Name: Kevin Austin DOB: January 31, 1991 MRN: [id number]   Date of Service  10/24/2015  HPI/Events of Note  Request for foot stick for AM labs.   eICU Interventions  Will order: 1. May stick foot for AM labs.      Intervention Category Minor Interventions: Routine modifications to care plan (e.g. PRN medications for pain, fever)  Kevin Austin 10/24/2015, 6:09 AM

## 2015-10-24 NOTE — Progress Notes (Signed)
*  PRELIMINARY RESULTS* Echocardiogram Echocardiogram Transesophageal has been performed.  Margreta JourneyLOMBARDO, Johari Bennetts 10/24/2015, 1:46 PM

## 2015-10-25 ENCOUNTER — Inpatient Hospital Stay (HOSPITAL_COMMUNITY): Payer: Medicaid Other

## 2015-10-25 DIAGNOSIS — D696 Thrombocytopenia, unspecified: Secondary | ICD-10-CM

## 2015-10-25 DIAGNOSIS — J9601 Acute respiratory failure with hypoxia: Secondary | ICD-10-CM

## 2015-10-25 DIAGNOSIS — R410 Disorientation, unspecified: Secondary | ICD-10-CM

## 2015-10-25 DIAGNOSIS — I2690 Septic pulmonary embolism without acute cor pulmonale: Secondary | ICD-10-CM

## 2015-10-25 LAB — GLUCOSE, CAPILLARY
GLUCOSE-CAPILLARY: 99 mg/dL (ref 65–99)
GLUCOSE-CAPILLARY: 149 mg/dL — AB (ref 65–99)

## 2015-10-25 LAB — TRIGLYCERIDES: Triglycerides: 259 mg/dL — ABNORMAL HIGH (ref <150)

## 2015-10-25 LAB — CBC
HCT: 32.8 % — ABNORMAL LOW (ref 39.0–52.0)
Hemoglobin: 10.6 g/dL — ABNORMAL LOW (ref 13.0–17.0)
MCH: 27.2 pg (ref 26.0–34.0)
MCHC: 32.3 g/dL (ref 30.0–36.0)
MCV: 84.1 fL (ref 78.0–100.0)
PLATELETS: 108 10*3/uL — AB (ref 150–400)
RBC: 3.9 MIL/uL — ABNORMAL LOW (ref 4.22–5.81)
RDW: 16.1 % — AB (ref 11.5–15.5)
WBC: 17.2 10*3/uL — AB (ref 4.0–10.5)

## 2015-10-25 LAB — RENAL FUNCTION PANEL
ANION GAP: 8 (ref 5–15)
Albumin: 1.3 g/dL — ABNORMAL LOW (ref 3.5–5.0)
BUN: 11 mg/dL (ref 6–20)
CALCIUM: 7.7 mg/dL — AB (ref 8.9–10.3)
CHLORIDE: 99 mmol/L — AB (ref 101–111)
CO2: 28 mmol/L (ref 22–32)
Creatinine, Ser: 0.41 mg/dL — ABNORMAL LOW (ref 0.61–1.24)
GFR calc non Af Amer: >60 mL/min (ref >60)
GLUCOSE: 102 mg/dL — AB (ref 65–99)
Phosphorus: 3 mg/dL (ref 2.5–4.6)
Potassium: 3.5 mmol/L (ref 3.5–5.1)
SODIUM: 135 mmol/L (ref 135–145)

## 2015-10-25 LAB — CULTURE, BLOOD (ROUTINE X 2)

## 2015-10-25 LAB — MAGNESIUM: MAGNESIUM: 2.1 mg/dL (ref 1.7–2.4)

## 2015-10-25 MED ORDER — POTASSIUM CHLORIDE 10 MEQ/50ML IV SOLN
10.0000 meq | INTRAVENOUS | Status: AC
  Administered 2015-10-25 (×2): 10 meq via INTRAVENOUS
  Filled 2015-10-25 (×2): qty 50

## 2015-10-25 MED ORDER — VITAL HIGH PROTEIN PO LIQD
1000.0000 mL | ORAL | Status: DC
  Administered 2015-10-25 – 2015-10-28 (×6): 1000 mL

## 2015-10-25 MED ORDER — FAMOTIDINE 40 MG/5ML PO SUSR
20.0000 mg | Freq: Two times a day (BID) | ORAL | Status: DC
  Administered 2015-10-25 – 2015-10-27 (×6): 20 mg
  Filled 2015-10-25 (×7): qty 2.5

## 2015-10-25 MED ORDER — METHADONE HCL 10 MG PO TABS
20.0000 mg | ORAL_TABLET | Freq: Three times a day (TID) | ORAL | Status: DC
  Administered 2015-10-25 – 2015-11-11 (×45): 20 mg
  Filled 2015-10-25 (×47): qty 2

## 2015-10-25 MED ORDER — ACETAMINOPHEN 160 MG/5ML PO SOLN
650.0000 mg | Freq: Four times a day (QID) | ORAL | Status: DC | PRN
Start: 2015-10-25 — End: 2015-12-07
  Administered 2015-10-25 – 2015-12-06 (×21): 650 mg via ORAL
  Filled 2015-10-25 (×26): qty 20.3

## 2015-10-25 NOTE — Progress Notes (Signed)
Patient ETT advanced 2cm per MD order.  Bilateral breath sounds noted.  No complications.

## 2015-10-25 NOTE — Progress Notes (Signed)
   Assessment: 2 Days Post-Op  S/P Procedure(s) (LRB): IRRIGATION AND DEBRIDEMENT FOOT (Left) by Dr. Jewel Baizeimothy D. Murphy on 10/24/15  Active Problems:   Acute respiratory failure (HCC)   Abscess   Altered mental status   Encounter for central line placement  Left foot Intact pulses distally.  Foot warm.  Area around incision pink.  Loose suture in place.  Moderate amount of cloudy/bloody fluid expressed after iodoform packing was removed.    Cx left foot pending, shows few Staph Aureus  Plan: Continue Abx and plan per CCM. Iodoform removed.  Loose suture in place to allow drainage.  Continue pressure gauze dressing / kerlix / ace wrap.  Weight Bearing: Weight Bearing as Tolerated (WBAT) left foot Dressings: reinforce prn.  Sutures to be removed in ~10 days. VTE prophylaxis: on sq heparin Dispo: per primary  Subjective: Intubated, sedated.  Opens eyes and reports feeling in left foot.  Objective:   VITALS:   Filed Vitals:   10/25/15 0500 10/25/15 0530 10/25/15 0600 10/25/15 0630  BP:  126/91 134/90 130/91  Pulse: 124 123 123 121  Temp: 98.1 F (36.7 C) 97.7 F (36.5 C) 97.3 F (36.3 C) 97 F (36.1 C)  TempSrc:      Resp: 17 17 18 19   Height:      Weight:      SpO2: 100% 100% 99% 100%    Lab Results  Component Value Date   WBC 19.1* 10/24/2015   HGB 11.1* 10/24/2015   HCT 32.9* 10/24/2015   MCV 79.7 10/24/2015   PLT  10/24/2015    PLATELET CLUMPS NOTED ON SMEAR, COUNT APPEARS DECREASED    Physical Exam General: NAD.  Intubated, sedated  MSK: Left Foot:  Intact pulses distally.  Foot warm.  Area around incision pink.  Loose suture in place.  Moderate amount of cloudy/bloody fluid expressed after iodoform packing was removed.    Kevin Austin 10/25/2015, 7:35 AM

## 2015-10-25 NOTE — Progress Notes (Signed)
[facility] FOR [facility] LAB REPLACEMENT ONLY  The patient does apply [facility] the [facility] Electro[facility] Protocol based on the criteria listed below:   1. Is GFR >/= 40 ml/min? Yes.    Patient's GFR today is >60 2. Is urine output >/= 0.5 ml/kg/hr for the last 6 hours? Yes.   Patient's UOP is 0.9 ml/kg/hr 3. Is BUN < 60 mg/dL? Yes.    Patient's BUN today is 11 4. Abnormal electrolyte(s): Potassium 3.5 5. Ordered repletion with: Potassium per Protocol 6. If a panic level lab has been reported, has the CCM MD in charge been notified? No..   Physician:  Max SaneSimonds  Cassian Torelli P 10/25/2015 5:42 AM

## 2015-10-25 NOTE — Progress Notes (Signed)
Initial Nutrition Assessment  DOCUMENTATION CODES:   Not applicable  INTERVENTION:    Initiate TF via OGT with Vital High Protein at goal rate of 75 ml/h (1800 ml per day) to provide 1800 kcals, 158 gm protein, 1505 ml free water daily.  Total intake with TF + Propofol will be 2497 kcals.  NUTRITION DIAGNOSIS:   Inadequate oral intake related to inability to eat as evidenced by NPO status.  GOAL:   Patient will meet greater than or equal to 90% of their needs  MONITOR:   Vent status, Labs, Weight trends, TF tolerance, Skin, I & O's  REASON FOR ASSESSMENT:   Consult Enteral/tube feeding initiation and management  ASSESSMENT:   25 year old admitted with severe sepsis, multiple septic emboli. 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  Patient self extubated on 6/27. S/P I&D of bilateral hands and left foot on 6/27, remained on vent post-op. Nutrition focused physical exam completed.  No muscle or subcutaneous fat depletion noticed. Received MD Consult for TF initiation and management. Discussed patient with RN today.  Labs and medications reviewed. Patient is currently intubated on ventilator support MV: 10.6 L/min Temp (24hrs), Avg:100 F (37.8 C), Min:97 F (36.1 C), Max:102 F (38.9 C)  Propofol: 26.4 ml/hr providing 697 kcals   Diet Order:  Diet NPO time specified  Skin:  Wound (see comment) (abscesses to foot and hand, s/p I&D 6/27)  Last BM:  PTA  Height:   Ht Readings from Last 1 Encounters:  10/22/15 5\' 11"  (1.803 m)    Weight:   Wt Readings from Last 1 Encounters:  10/24/15 194 lb 0.1 oz (88 kg)    Ideal Body Weight:  78.2 kg  BMI:  Body mass index is 27.07 kg/(m^2).  Estimated Nutritional Needs:   Kcal:  2468  Protein:  140-160 gm  Fluid:  2.5 L  EDUCATION NEEDS:   No education needs identified at this time  Joaquin CourtsKimberly Masayuki Sakai, RD, LDN, CNSC Pager [phone] After Hours Pager [phone]

## 2015-10-25 NOTE — Progress Notes (Signed)
[facility] for Infectious Disease    Date of Admission:  10/22/2015   Total days of antibiotics 4        Day 4vanco           ID: Kevin Austin is a 25 y.o. male with mrsa bacteremia with disseminated disease Active Problems:   Acute respiratory failure (HCC)   Abscess   Altered mental status   Encounter for central line placement    Subjective: Febrile, but fever curve trending down and remains intubated  24hr: underwent TEE which was negative for vegetation, vanco trough subtherapeutic at 9 yesterday. Left ij placed  Medications:  . antiseptic oral rinse  7 mL Mouth Rinse QID  . chlorhexidine gluconate (SAGE KIT)  15 mL Mouth Rinse BID  . Chlorhexidine Gluconate Cloth  6 each Topical Q0600  . famotidine (PEPCID) IV  20 mg Intravenous Q12H  . heparin  5,000 Units Subcutaneous Q8H  . methadone  10 mg Oral Q8H  . mupirocin ointment  1 application Nasal BID  . sodium chloride  1,000 mL Intravenous Once  . vancomycin  1,250 mg Intravenous Q8H   Objective: Vital signs in last 24 hours: Temp:  [97 F (36.1 C)-102.4 F (39.1 C)] 97 F (36.1 C) (06/29 0630) Pulse Rate:  [121-146] 123 (06/29 0752) Resp:  [17-34] 27 (06/29 0752) BP: (113-150)/(72-100) 122/89 mmHg (06/29 0752) SpO2:  [84 %-100 %] 100 % (06/29 0752) FiO2 (%):  [30 %] 30 % (06/29 0800) Physical Exam  Constitutional: intubated, and sedated. He appears well-developed and well-nourished. No distress.  HENT: ETT in place Mouth/Throat: Oropharynx is clear and moist. No oropharyngeal exudate.  Cardiovascular: tachy, regular rhythm and normal heart sounds. Exam reveals no gallop and no friction rub.  No murmur heard.  Pulmonary/Chest: Effort normal and breath sounds normal. No respiratory distress. He has no wheezes.  Abdominal: Soft. Bowel sounds are normal. He exhibits no distension. There is no tenderness.  Ext: right and left hand wrapped, left foot wrapped Neurological: winces when examining sternum Skin:  Skin is warm and dry. Scattered excoriation Psychiatric: sedated    Lab Results  Recent Labs  10/24/15 0926 10/25/15 0440 10/25/15 0648  WBC 19.1*  --  17.2*  HGB 11.1*  --  10.6*  HCT 32.9*  --  32.8*  NA 134* 135  --   K 4.2 3.5  --   CL 102 99*  --   CO2 20* 28  --   BUN 12 11  --   CREATININE 0.53* 0.41*  --    Liver Panel  Recent Labs  10/22/15 1844 10/25/15 0440  PROT 5.4*  --   ALBUMIN 1.8* 1.3*  AST 52*  --   ALT 56  --   ALKPHOS 333*  --   BILITOT 3.0*  --    No results found for: ESRSEDRATE, POCTSEDRATE  Microbiology: 6/26 blood cx MRSA 6/27 tissue cx [facility] 6/27 blood cx -rapid dx showed MSSA,  Studies/Results: Ct Head Wo Contrast  10/23/2015  CLINICAL DATA:  Multiple abscesses. Febrile. Altered mental status. EXAM: CT HEAD WITHOUT CONTRAST TECHNIQUE: Contiguous axial images were obtained from the base of the skull through the vertex without intravenous contrast. COMPARISON:  None. FINDINGS: No acute cortical infarct, hemorrhage, or mass lesion is present. Ventricles are of normal size. No significant extra-axial fluid collection is present. Partial opacification with air-fluid level involves the right maxillary sinus. The mastoid air cells appear clear. The osseous skull is intact. IMPRESSION:  1. No acute intracranial abnormalities. 2. Right maxillary sinus opacification with air-fluid level. Electronically Signed   By: Kerby Moors M.D.   On: 10/23/2015 17:05   Dg Chest Port 1 View  10/25/2015  CLINICAL DATA:  Pneumonia, followup examination EXAM: PORTABLE CHEST 1 VIEW COMPARISON:  10/24/2015 FINDINGS: Endotracheal tube, nasogastric catheter and left jugular central line are again seen and stable. Persistent left mid to lower lung infiltrate is seen. Mild central vascular congestion remains. No bony abnormality is noted. IMPRESSION: Stable left-sided infiltrate and congestive failure. Electronically Signed   By: Inez Catalina M.D.   On: 10/25/2015 07:22    Dg Chest Port 1 View  10/24/2015  CLINICAL DATA:  Central line placement EXAM: PORTABLE CHEST 1 VIEW COMPARISON:  October 22, 2015 FINDINGS: Left jugular central venous line is identified with distal tip in the superior vena cava. There is no pleural line to suggest pneumothorax. Endotracheal tube is identified with distal tip 5.1 cm from carina. Nasogastric tube is identified with distal tip not included but is at least in the stomach. There is consolidation of the left mid to lung base consistent with pneumonia. There is pulmonary edema. There is no pleural effusion. No acute abnormality is identified in the visualized bones appear IMPRESSION: Left jugular central venous line with distal tip in the superior vena cava. There is no pneumothorax. Pulmonary edema. Left mid to lung base pneumonia. Electronically Signed   By: Abelardo Diesel M.D.   On: 10/24/2015 21:48   Dg Abd Portable 1v  10/24/2015  CLINICAL DATA:  Gastric catheter placement EXAM: PORTABLE ABDOMEN - 1 VIEW COMPARISON:  10/24/2015 FINDINGS: The gastric catheter has been advanced further into the stomach and the tip now lies in the distal antrum. No other focal abnormality is seen. IMPRESSION: Gastric catheter within the distal stomach. Electronically Signed   By: Inez Catalina M.D.   On: 10/24/2015 16:08   Dg Abd Portable 1v  10/24/2015  CLINICAL DATA:  OG tube placement EXAM: PORTABLE ABDOMEN - 1 VIEW COMPARISON:  CT 10/22/2015 FINDINGS: Enteric tube tip is in the distal esophagus near the GE junction. Nonobstructive bowel gas pattern. IMPRESSION: Enteric tube tip in the distal esophagus near the GE junction. Electronically Signed   By: Rolm Baptise M.D.   On: 10/24/2015 15:20   Dg Hand Complete Right  10/23/2015  CLINICAL DATA:  Soft tissue swelling dorsally EXAM: RIGHT HAND - COMPLETE 3+ VIEW COMPARISON:  CT right hand October 22, 2015 FINDINGS: Frontal, oblique and lateral views were obtained. There is soft tissue swelling medially and  dorsally. No air-fluid level or radiopaque foreign body seen. No fracture or dislocation. The joint spaces appear normal. No erosive change evident. IMPRESSION: Soft tissue swelling medially and dorsally without air or radiopaque foreign body apparent. No demonstrable arthropathy. No fracture or dislocation. Electronically Signed   By: Lowella Grip III M.D.   On: 10/23/2015 14:39     Assessment/Plan: mrsa disseminated infection/bacteremia with septic emboli to lung, kidney c/b deep tissue abscess to right and left hand and left foot s/p debridement. Also CT reviewed to be c/w sternal osteo  mrsa bacteremia = keep on vancomycin alone, dose adjusted yesterday since subtherapeutic. i spoke with unit pharmacist to aim towards trough of 20. Will repeat blood cx today since still bacteremic based upon cx from 6/28. Surprising that TEE was negative, possibly all emboli have been dislodged.   -rapid dx on blood cx from 6/28 showed MSSA, which is unusual. Will need  to follow all cx, especially or cultures from hand/feet abscess debridement  Sternal osteo = may need mri/CT to evaluate for sternal osteo and any need for debridement  Kidney with septic emboli = concern that may develop kidney abscess. Would recommend repeat imaging in a few days  Hand/feet abscess s/p debridement = followed by ortho to make dressing change recs. Defer to ortho to whether he needs further debridement        Antimicrobial Management Team Staphylococcus aureus bacteremia   Staphylococcus aureus bacteremia (SAB) is associated with a high rate of complications and mortality.  Specific aspects of clinical management are critical to optimizing the outcome of patients with SAB.  Therefore, the [facility] Antimicrobial Management Team [facility]) has initiated an intervention aimed at improving the management of SAB at [facility].  To do so, Infectious Diseases physicians are providing an evidence-based consult for the  management of all patients with SAB.     Yes No Comments  Perform follow-up blood cultures (even if the patient is afebrile) to ensure clearance of bacteremia '[x]'  '[]'  6/28- still positive, need to repeat       Perform echocardiography to evaluate for endocarditis (transthoracic ECHO is 40-50% sensitive, TEE is > 90% sensitive) '[x]'  '[]'   tee is negative       Ensure source control '[x]'  '[]'  Hand and foot debridement  Investigate for "metastatic" sites of infection '[x]'  '[]'  At [place], he had c/a/p.  - need repeat chest MRI to look at sternal osteo  -need brain mri or NCHCT to look for septic emboli  Change antibiotic therapy to _____vancomycin___ '[x]'  '[]'  Vancomycin, goal trough should be 15 - 20 mcg/mL  Estimated duration of IV antibiotic therapy:  6-8 wk '[x]'  '[]'  Consult case management for probably prolonged outpatient IV antibiotic therapy     Baxter Flattery [facility] for Infectious Diseases Cell: [phone] Pager: [phone]  10/25/2015, 8:49 AM

## 2015-10-25 NOTE — Progress Notes (Signed)
Physical Therapy Wound Evaluation/Treatment Patient Details  Name: Kevin Austin MRN: [id number] Date of Birth: [birth year]  Today's Date: 10/25/2015 Time: 0902-1027 Time Calculation (min): 85 min  Subjective  Subjective: Pt sedated during hydro session Patient and Family Stated Goals: None stated Date of Onset:  (Unknown) Prior Treatments: I&D 6/27  Pain Score: Sedated  Wound Assessment  Wound / Incision (Open or Dehisced) 10/25/15 Incision - Open Hand Right Dorsal aspect - Ulnar side (Active)  Dressing Type ABD;Compression wrap;Gauze (Comment);Moist to dry 10/25/2015  9:37 AM  Dressing Changed Changed 10/25/2015  9:37 AM  Dressing Status Clean;Dry;Intact 10/25/2015  9:37 AM  Dressing Change Frequency Daily 10/25/2015  9:37 AM  Site / Wound Assessment Bleeding;Red;Pink 10/25/2015  9:37 AM  % Wound base Red or Granulating 100% 10/25/2015  9:37 AM  % Wound base Yellow 0% 10/25/2015  9:37 AM  % Wound base Black 0% 10/25/2015  9:37 AM  Peri-wound Assessment Maceration;Erythema (blanchable) 10/25/2015  9:37 AM  Wound Length (cm) 6.5 cm 10/25/2015  9:37 AM  Wound Width (cm) 1.5 cm 10/25/2015  9:37 AM  Wound Depth (cm) 1.2 cm 10/25/2015  9:37 AM  Undermining (cm) 3-1 o'clock 2.9 cm, 7.-10 o'clock 4.2 cm (connecting), 11-12 o'clock 3.5 cm 10/25/2015  9:37 AM  Margins Unattached edges (unapproximated) 10/25/2015  9:37 AM  Closure None 10/25/2015  9:37 AM  Drainage Amount Moderate 10/25/2015  9:37 AM  Drainage Description Serosanguineous 10/25/2015  9:37 AM  Non-staged Wound Description Not applicable 6/38/4536  4:68 AM  Treatment Hydrotherapy (Pulse lavage);Packing (Impregnated strip) 10/25/2015  9:37 AM     Wound / Incision (Open or Dehisced) 10/25/15 Incision - Open Hand Right Dorsal aspect - Radial side (Active)  Dressing Type ABD;Compression wrap;Gauze (Comment);Moist to dry 10/25/2015  9:37 AM  Dressing Changed Changed 10/25/2015  9:37 AM  Dressing Status Clean;Dry;Intact 10/25/2015  9:37 AM  Dressing  Change Frequency Daily 10/25/2015  9:37 AM  Site / Wound Assessment Red;Bleeding 10/25/2015  9:37 AM  % Wound base Red or Granulating 100% 10/25/2015  9:37 AM  % Wound base Yellow 0% 10/25/2015  9:37 AM  % Wound base Black 0% 10/25/2015  9:37 AM  Wound Length (cm) 5 cm 10/25/2015  9:37 AM  Wound Width (cm) 1 cm 10/25/2015  9:37 AM  Wound Depth (cm) 1.2 cm 10/25/2015  9:37 AM  Undermining (cm) 1-5 o'clock 4.5 cm (connecting), 9 o'clock 1.6 cm  10/25/2015  9:37 AM  Margins Unattached edges (unapproximated) 10/25/2015  9:37 AM  Closure None 10/25/2015  9:37 AM  Drainage Amount Moderate 10/25/2015  9:37 AM  Drainage Description Serosanguineous 10/25/2015  9:37 AM  Non-staged Wound Description Not applicable 0/32/1224  8:25 AM  Treatment Hydrotherapy (Pulse lavage);Packing (Impregnated strip) 10/25/2015  9:37 AM     Wound / Incision (Open or Dehisced) 10/25/15 Incision - Open Hand Left Dorsal aspect (Active)  Dressing Type ABD;Compression wrap;Gauze (Comment);Moist to dry 10/25/2015  9:37 AM  Dressing Changed Changed 10/25/2015  9:37 AM  Dressing Status Clean;Dry;Intact 10/25/2015  9:37 AM  Dressing Change Frequency Daily 10/25/2015  9:37 AM  Site / Wound Assessment Bleeding;Red 10/25/2015  9:37 AM  % Wound base Red or Granulating 100% 10/25/2015  9:37 AM  % Wound base Yellow 0% 10/25/2015  9:37 AM  % Wound base Black 0% 10/25/2015  9:37 AM  Peri-wound Assessment Erythema (blanchable);Intact 10/25/2015  9:37 AM  Wound Length (cm) 4 cm 10/25/2015  9:37 AM  Wound Width (cm) 0.8 cm 10/25/2015  9:37 AM  Wound  Depth (cm) 1.5 cm 10/25/2015  9:37 AM  Undermining (cm) 2-4 o'clock 2.7 cm, 10 o'clock 2.1 cm 10/25/2015  9:37 AM  Margins Unattached edges (unapproximated) 10/25/2015  9:37 AM  Closure None 10/25/2015  9:37 AM  Drainage Amount Moderate 10/25/2015  9:37 AM  Drainage Description Serosanguineous 10/25/2015  9:37 AM  Non-staged Wound Description Not applicable 05/22/5807  9:83 AM  Treatment Hydrotherapy (Pulse  lavage);Packing (Impregnated strip) 10/25/2015  9:37 AM     Wound / Incision (Open or Dehisced) 10/25/15 Incision - Open Hand Left Palmar aspect (Active)  Dressing Type Compression wrap;Gauze (Comment);Moist to dry 10/25/2015  9:37 AM  Dressing Changed Changed 10/25/2015  9:37 AM  Dressing Status Clean;Dry;Intact 10/25/2015  9:37 AM  Dressing Change Frequency Daily 10/25/2015  9:37 AM  Site / Wound Assessment Bleeding;Red 10/25/2015  9:37 AM  % Wound base Red or Granulating 100% 10/25/2015  9:37 AM  % Wound base Yellow 0% 10/25/2015  9:37 AM  % Wound base Black 0% 10/25/2015  9:37 AM  Peri-wound Assessment Intact;Maceration 10/25/2015  9:37 AM  Wound Length (cm) 0.3 cm 10/25/2015  9:37 AM  Wound Width (cm) 4.1 cm 10/25/2015  9:37 AM  Wound Depth (cm) 1.8 cm 10/25/2015  9:37 AM  Undermining (cm) 7 o'clock 3.0 cm, 9 o'clock 2.2 cm, 1 o'clock .7 cm, 3-6 o'clock 1.2 cm 10/25/2015  9:37 AM  Margins Unattached edges (unapproximated) 10/25/2015  9:37 AM  Drainage Amount Moderate 10/25/2015  9:37 AM  Drainage Description Purulent 10/25/2015  9:37 AM  Non-staged Wound Description Not applicable 3/82/5053  9:76 AM  Treatment Hydrotherapy (Pulse lavage);Packing (Impregnated strip) 10/25/2015  9:37 AM   Hydrotherapy Pulsed lavage therapy - wound location: All wound locations Pulsed Lavage with Suction (psi): 12 psi Pulsed Lavage with Suction - Normal Saline Used: 2000 mL (500 mL for each wound ) Pulsed Lavage Tip: Tip with splash shield   Wound Assessment and Plan  Wound Therapy - Assess/Plan/Recommendations Wound Therapy - Clinical Statement: Pt presents to hydrotherapy s/p I&D on bilateral hands. 4 wound locations total. Pt will benefit from continued hydrotherapy to promote wound bed healing and to decrease bioburden.  Wound Therapy - Functional Problem List: Decreased strength/AROM of hands/fingers for ADL's and fine motor tasks.  Factors Delaying/Impairing Wound Healing: Substance abuse Hydrotherapy Plan:  Debridement;Patient/family education;Pulsatile lavage with suction;Dressing change Wound Therapy - Frequency: 6X / week Wound Therapy - Follow Up Recommendations: Home health RN Wound Plan: See above  Wound Therapy Goals- Improve the function of patient's integumentary system by progressing the wound(s) through the phases of wound healing (inflammation - proliferation - remodeling) by: Patient/Family will be able to : complete dressing changes independently prior to d/c Patient/Family Instruction Goal - Progress: Goal set today Goals/treatment plan/discharge plan were made with and agreed upon by patient/family: No, Patient unable to participate in goals/treatment/discharge plan and family unavailable Time For Goal Achievement: 7 days Wound Therapy - Potential for Goals: Excellent  Goals will be updated until maximal potential achieved or discharge criteria met.  Discharge criteria: when goals achieved, discharge from hospital, MD decision/surgical intervention, no progress towards goals, refusal/missing three consecutive treatments without notification or medical reason.  GP     Rolinda Roan 10/25/2015, 10:56 AM   Rolinda Roan, PT, DPT Acute Rehabilitation Services Pager: [phone]
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
## 2015-10-26 ENCOUNTER — Encounter (HOSPITAL_COMMUNITY): Payer: Self-pay | Admitting: Radiology

## 2015-10-26 ENCOUNTER — Inpatient Hospital Stay (HOSPITAL_COMMUNITY): Payer: Medicaid Other

## 2015-10-26 DIAGNOSIS — J15212 Pneumonia due to Methicillin resistant Staphylococcus aureus: Secondary | ICD-10-CM

## 2015-10-26 LAB — HEPATIC FUNCTION PANEL
ALT: 20 U/L (ref 17–63)
AST: 46 U/L — ABNORMAL HIGH (ref 15–41)
Albumin: 1.1 g/dL — ABNORMAL LOW (ref 3.5–5.0)
Alkaline Phosphatase: 282 U/L — ABNORMAL HIGH (ref 38–126)
BILIRUBIN DIRECT: 4.1 mg/dL — AB (ref 0.1–0.5)
BILIRUBIN INDIRECT: 1.5 mg/dL — AB (ref 0.3–0.9)
Total Bilirubin: 5.6 mg/dL — ABNORMAL HIGH (ref 0.3–1.2)
Total Protein: 5.2 g/dL — ABNORMAL LOW (ref 6.5–8.1)

## 2015-10-26 LAB — TRIGLYCERIDES: Triglycerides: 238 mg/dL — ABNORMAL HIGH (ref <150)

## 2015-10-26 LAB — RENAL FUNCTION PANEL
ANION GAP: 9 (ref 5–15)
Albumin: 1.1 g/dL — ABNORMAL LOW (ref 3.5–5.0)
BUN: 10 mg/dL (ref 6–20)
CALCIUM: 7.7 mg/dL — AB (ref 8.9–10.3)
CO2: 28 mmol/L (ref 22–32)
CREATININE: 0.49 mg/dL — AB (ref 0.61–1.24)
Chloride: 99 mmol/L — ABNORMAL LOW (ref 101–111)
GFR calc Af Amer: >60 mL/min (ref >60)
GFR calc non Af Amer: >60 mL/min (ref >60)
GLUCOSE: 108 mg/dL — AB (ref 65–99)
Phosphorus: 4.2 mg/dL (ref 2.5–4.6)
Potassium: 3.8 mmol/L (ref 3.5–5.1)
SODIUM: 136 mmol/L (ref 135–145)

## 2015-10-26 LAB — GLUCOSE, CAPILLARY
GLUCOSE-CAPILLARY: 110 mg/dL — AB (ref 65–99)
GLUCOSE-CAPILLARY: 111 mg/dL — AB (ref 65–99)
GLUCOSE-CAPILLARY: 121 mg/dL — AB (ref 65–99)
GLUCOSE-CAPILLARY: 122 mg/dL — AB (ref 65–99)
GLUCOSE-CAPILLARY: 124 mg/dL — AB (ref 65–99)
Glucose-Capillary: 130 mg/dL — ABNORMAL HIGH (ref 65–99)
Glucose-Capillary: 114 mg/dL — ABNORMAL HIGH (ref 65–99)

## 2015-10-26 LAB — CULTURE, RESPIRATORY: SPECIAL REQUESTS: NORMAL

## 2015-10-26 LAB — CBC
HCT: 31.1 % — ABNORMAL LOW (ref 39.0–52.0)
Hemoglobin: 9.7 g/dL — ABNORMAL LOW (ref 13.0–17.0)
MCH: 27.1 pg (ref 26.0–34.0)
MCHC: 31.2 g/dL (ref 30.0–36.0)
MCV: 86.9 fL (ref 78.0–100.0)
PLATELETS: 115 10*3/uL — AB (ref 150–400)
RBC: 3.58 MIL/uL — ABNORMAL LOW (ref 4.22–5.81)
RDW: 16.2 % — AB (ref 11.5–15.5)
WBC: 14.5 10*3/uL — AB (ref 4.0–10.5)

## 2015-10-26 LAB — MAGNESIUM: MAGNESIUM: 1.9 mg/dL (ref 1.7–2.4)

## 2015-10-26 LAB — VANCOMYCIN, TROUGH: Vancomycin Tr: 14 ug/mL — ABNORMAL LOW (ref 15–20)

## 2015-10-26 MED ORDER — FUROSEMIDE 10 MG/ML IJ SOLN
20.0000 mg | Freq: Once | INTRAMUSCULAR | Status: AC
  Administered 2015-10-26: 20 mg via INTRAVENOUS
  Filled 2015-10-26: qty 2

## 2015-10-26 MED ORDER — IOPAMIDOL (ISOVUE-300) INJECTION 61%
INTRAVENOUS | Status: AC
  Administered 2015-10-26: 75 mL
  Filled 2015-10-26: qty 75

## 2015-10-26 MED ORDER — POTASSIUM CHLORIDE 20 MEQ/15ML (10%) PO SOLN
20.0000 meq | Freq: Two times a day (BID) | ORAL | Status: AC
  Administered 2015-10-26 (×2): 20 meq via ORAL
  Filled 2015-10-26 (×2): qty 15

## 2015-10-26 MED ORDER — VANCOMYCIN HCL 10 G IV SOLR
1500.0000 mg | Freq: Three times a day (TID) | INTRAVENOUS | Status: DC
  Administered 2015-10-26 – 2015-10-31 (×16): 1500 mg via INTRAVENOUS
  Filled 2015-10-26 (×18): qty 1500

## 2015-10-26 MED ORDER — FUROSEMIDE 10 MG/ML IJ SOLN
10.0000 mg | Freq: Once | INTRAMUSCULAR | Status: AC
  Administered 2015-10-26: 10 mg via INTRAVENOUS
  Filled 2015-10-26: qty 2

## 2015-10-26 NOTE — Progress Notes (Signed)
   Assessment: 3 Days Post-Op  S/P Procedure(s) (LRB): IRRIGATION AND DEBRIDEMENT FOOT (Left) by Dr. Jewel Baizeimothy D. Murphy on 10/24/15  Active Problems:   Acute respiratory failure (HCC)   Abscess   Altered mental status   Encounter for central line placement  Left foot Intact pulses distally.  Foot warm.  Area around incision pink - less swollen.  Loose suture in place - center of would open to drain.  Small / decreased amount of cloudy/bloody fluid expressed.    Plan: Continue Abx and plan per CCM. Loose suture in place to allow drainage.   Continue pressure gauze dressing / kerlix / ace wrap.  Weight Bearing: Weight Bearing as Tolerated (WBAT) left foot Dressings: reinforce prn.  Sutures to be removed in ~10 days post op. VTE prophylaxis: on sq heparin Dispo: per primary  Subjective: Intubated, sedated.   Objective:   VITALS:   Filed Vitals:   10/26/15 0500 10/26/15 0600 10/26/15 0731 10/26/15 0751  BP: 139/88 150/89  142/83  Pulse: 129 132  127  Temp: 98.4 F (36.9 C) 98.6 F (37 C) 98.4 F (36.9 C)   TempSrc:   Oral   Resp: 17 18  24   Height:      Weight: 93.8 kg (206 lb 12.7 oz)     SpO2: 100% 98%  96%    Lab Results  Component Value Date   WBC 17.2* 10/25/2015   HGB 10.6* 10/25/2015   HCT 32.8* 10/25/2015   MCV 84.1 10/25/2015   PLT 108* 10/25/2015    Physical Exam General: NAD.  Intubated, sedated  MSK: Left Foot:  Intact pulses distally.  Foot warm.  Area around incision pink.  Swelling decreased.   Loose suture in place - center of would open to drain.  Small / decreased amount of cloudy/bloody fluid expressed.    Kevin Austin 10/26/2015, 8:04 AM

## 2015-10-26 NOTE — Progress Notes (Signed)
Patient remains intubated. Patient agitated overnight and required restraints as well as Precedex for treatment of delirium. CSW following for substance abuse assessment/resources once medically appropriate.         Lance MussAshley Gardner,MSW, LCSW [facility] ED/58M Clinical Social Worker [phone]

## 2015-10-26 NOTE — Progress Notes (Signed)
eLink Physician-Brief Progress Note Patient Name: Kevin Austin DOB: 1991/01/16 MRN: [id number]   Date of Service  10/26/2015  HPI/Events of Note  Remains net positive.  Goal is net negative of l liter.  eICU Interventions  20 mg lasix IV ordered Minimize fluid intake     Intervention Category Intermediate Interventions: Other:  Poetry Cerro 10/26/2015, 10:47 PM
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
## 2015-10-26 NOTE — Progress Notes (Addendum)
[facility] for Infectious Disease    Date of Admission:  10/22/2015   Total days of antibiotics 5        Day 5 vanco           ID: Kevin Austin is a 25 y.o. male with mrsa bacteremia with disseminated disease Active Problems:   Acute respiratory failure ([place])   Abscess   Altered mental status   Encounter for central line placement    Subjective: Febrile, and remains intubated  24hr: getting dressing changes per wound care  Medications:  . antiseptic oral rinse  7 mL Mouth Rinse QID  . chlorhexidine gluconate (SAGE KIT)  15 mL Mouth Rinse BID  . Chlorhexidine Gluconate Cloth  6 each Topical Q0600  . famotidine  20 mg Per Tube BID  . feeding supplement (VITAL HIGH PROTEIN)  1,000 mL Per Tube Q24H  . heparin  5,000 Units Subcutaneous Q8H  . methadone  20 mg Per Tube Q8H  . mupirocin ointment  1 application Nasal BID  . potassium chloride  20 mEq Oral BID  . sodium chloride  1,000 mL Intravenous Once  . vancomycin  1,500 mg Intravenous Q8H   Objective: Vital signs in last 24 hours: Temp:  [97.3 F (36.3 C)-101.1 F (38.4 C)] 100.2 F (37.9 C) (06/30 1300) Pulse Rate:  [110-148] 134 (06/30 1300) Resp:  [11-36] 16 (06/30 1300) BP: (107-159)/(52-89) 122/63 mmHg (06/30 1300) SpO2:  [92 %-100 %] 99 % (06/30 1300) FiO2 (%):  [40 %] 40 % (06/30 1138) Weight:  [206 lb 12.7 oz (93.8 kg)] 206 lb 12.7 oz (93.8 kg) (06/30 0500) Physical Exam  Constitutional: intubated, and sedated. He appears well-developed and well-nourished. No distress.  HENT: ETT in place Mouth/Throat: Oropharynx is clear and moist. No oropharyngeal exudate.  Cardiovascular: tachycardic, regular rhythm and normal heart sounds. Exam reveals no gallop and no friction rub.  No murmur heard.  Pulmonary/Chest: Effort normal and breath sounds normal. No respiratory distress. He has no wheezes.  Abdominal: Soft. Bowel sounds are normal. He exhibits no distension. There is no tenderness.  Ext: right and left  hand wrapped, left foot wrapped Neurological: winces when examining sternum Skin: Skin is warm and dry. Scattered excoriation. Jaundice appearing Psychiatric: sedated    Lab Results  Recent Labs  10/25/15 0440 10/25/15 0648 10/26/15 0430  WBC  --  17.2* 14.5*  HGB  --  10.6* 9.7*  HCT  --  32.8* 31.1*  NA 135  --  136  K 3.5  --  3.8  CL 99*  --  99*  CO2 28  --  28  BUN 11  --  10  CREATININE 0.41*  --  0.49*   Liver Panel  Recent Labs  10/25/15 0440 10/26/15 0430  ALBUMIN 1.3* 1.1*   No results found for: [place], [place]  Microbiology: 6/26 blood cx MRSA 6/27 tissue cx MRSA 6/27 blood cx -rapid dx showed MSSA, cx still pending 6/28 trach aspirate few staph aureus  Studies/Results: Dg Chest Port 1 View  10/25/2015  CLINICAL DATA:  Pneumonia, followup examination EXAM: PORTABLE CHEST 1 VIEW COMPARISON:  10/24/2015 FINDINGS: Endotracheal tube, nasogastric catheter and left jugular central line are again seen and stable. Persistent left mid to lower lung infiltrate is seen. Mild central vascular congestion remains. No bony abnormality is noted. IMPRESSION: Stable left-sided infiltrate and congestive failure. Electronically Signed   By: Inez Catalina M.D.   On: 10/25/2015 07:22   Dg Chest [facility]  1 View  10/24/2015  CLINICAL DATA:  Central line placement EXAM: PORTABLE CHEST 1 VIEW COMPARISON:  October 22, 2015 FINDINGS: Left jugular central venous line is identified with distal tip in the superior vena cava. There is no pleural line to suggest pneumothorax. Endotracheal tube is identified with distal tip 5.1 cm from carina. Nasogastric tube is identified with distal tip not included but is at least in the stomach. There is consolidation of the left mid to lung base consistent with pneumonia. There is pulmonary edema. There is no pleural effusion. No acute abnormality is identified in the visualized bones appear IMPRESSION: Left jugular central venous line with distal tip  in the superior vena cava. There is no pneumothorax. Pulmonary edema. Left mid to lung base pneumonia. Electronically Signed   By: Abelardo Diesel M.D.   On: 10/24/2015 21:48   Dg Abd Portable 1v  10/24/2015  CLINICAL DATA:  Gastric catheter placement EXAM: PORTABLE ABDOMEN - 1 VIEW COMPARISON:  10/24/2015 FINDINGS: The gastric catheter has been advanced further into the stomach and the tip now lies in the distal antrum. No other focal abnormality is seen. IMPRESSION: Gastric catheter within the distal stomach. Electronically Signed   By: Inez Catalina M.D.   On: 10/24/2015 16:08   Dg Abd Portable 1v  10/24/2015  CLINICAL DATA:  OG tube placement EXAM: PORTABLE ABDOMEN - 1 VIEW COMPARISON:  CT 10/22/2015 FINDINGS: Enteric tube tip is in the distal esophagus near the GE junction. Nonobstructive bowel gas pattern. IMPRESSION: Enteric tube tip in the distal esophagus near the GE junction. Electronically Signed   By: Rolm Baptise M.D.   On: 10/24/2015 15:20     Assessment/Plan: mrsa disseminated infection/bacteremia with septic emboli to lung, kidney c/b deep tissue abscess to right and left hand and left foot s/p debridement. Also CT reviewed to be c/w sternal osteo  mrsa bacteremia =  Still sub therapeutic at 14. Getting vanco dose adjusted for  aim towards trough of 20. Will repeat blood cx today since still bacteremic based upon cx from 6/28. Surprising that TEE was negative, possibly all emboli have been dislodged.   -rapid dx on blood cx from 6/28 showed MSSA, which is unusual. Will need to follow all cx, especially or cultures from hand/feet abscess debridement  Sternal osteo = may need mri/CT to evaluate for sternal osteo and any need for debridement  Kidney with septic emboli = concern that may develop kidney abscess. Would recommend repeat imaging in a few days  Hand/feet abscess s/p debridement = followed by ortho to make dressing change recs. Defer to ortho to whether he needs further  debridement  Fevers = not unexpected given big burden of disease to lung, and extremities  Jaundice = will check hepatic panel  Current access = left IJ was placed in the setting of bacteremia. when he improves, recommend to change out to different vascular access      [place] Antimicrobial Management Team Staphylococcus aureus bacteremia   Staphylococcus aureus bacteremia (SAB) is associated with a high rate of complications and mortality.  Specific aspects of clinical management are critical to optimizing the outcome of patients with SAB.  Therefore, the [facility] Health Antimicrobial Management Team [facility]) has initiated an intervention aimed at improving the management of SAB at [facility].  To do so, Infectious Diseases physicians are providing an evidence-based consult for the management of all patients with SAB.     Yes No Comments  Perform follow-up blood cultures (even if  the patient is afebrile) to ensure clearance of bacteremia _0  _1  6/30- pending       Perform echocardiography to evaluate for endocarditis (transthoracic ECHO is 40-50% sensitive, TEE is > 90% sensitive) _2  _3   tee is negative       Ensure source control _4  _5  Hand and foot debridement  Investigate for "metastatic" sites of infection _6  _7  At [facility], he had c/a/p.  - need repeat chest MRI to look at sternal osteo  -need brain mri or NCHCT to look for septic emboli  Change antibiotic therapy to _____vancomycin___ _8  _9  Vancomycin, goal trough should be 15 - 20 mcg/mL  Estimated duration of IV antibiotic therapy:  6-8 wk _10  _11  Consult case management for probably prolonged outpatient IV antibiotic therapy    Dr Linus Salmons to see patient over the weekend  [facility], [facility] for Infectious Diseases Cell: [phone] Pager: [phone]  10/26/2015, 1:36 PM

## 2015-10-26 NOTE — Progress Notes (Signed)
Patient transported to CT and back to room 2M13 without complications.  

## 2015-10-26 NOTE — Progress Notes (Signed)
Pharmacy Antibiotic Note  Kevin Austin is a 25 y.o. male admitted on 10/22/2015 with pneumonia and bacteremia.  Pharmacy has been consulted for vancomycin dosing.  Based on initial trough of 9 on 1g q8h, a dose increase to 1250 q8h should have produced a trough of ~ 11 mcg/mL. The trough today at ss on 1250 q8h was 14 mcg/mL. This tr is not adequate for patient's indication, however, likely showing patient is beginning to accumulate vancomycin  Plan: Increase vancomycin to 1500 mg q8h Trough at ss 1400 on 7/1 Will closely watch renal fx and VT on this high dose (pt will need tr before every 4th dose until dosing can be confirmed/stable)   Height: 5\' 11"  (180.3 cm) Weight: 206 lb 12.7 oz (93.8 kg) IBW/kg (Calculated) : 75.3  Temp (24hrs), Avg:99 F (37.2 C), Min:97.3 F (36.3 C), Max:101.7 F (38.7 C)   Recent Labs Lab 10/22/15 1844 10/23/15 0914 10/24/15 0926 10/24/15 2035 10/25/15 0440 10/25/15 0648 10/26/15 0430  WBC  --  18.5* 19.1*  --   --  17.2* 14.5*  CREATININE 0.85  --  0.53*  --  0.41*  --  0.49*  LATICACIDVEN 1.8  --   --   --   --   --   --   VANCOTROUGH  --   --   --  9*  --   --  14*    Estimated Creatinine Clearance: 168 mL/min (by C-G formula based on Cr of 0.49).    Allergies  Allergen Reactions  . [facility]orolac Nausea Only    [facility]  . Tramadol Nausea Only    Per [facility]    Antimicrobials this admission: Vanc 6/26 >  Zosyn 6/26 >>6/27 Cefepime 6/26 >6/26 Ceftaz 6/27>>6/28  Dose adjustments this admission: 6/28 VT = on 1g q8h 6/30 VT = 14 on 1250 q8  Microbiology results: 6/26 BCx2: staph aureus 6/26 BCID: MRSA 6/26 UCx: sent 6/26 Sputum: staph aureus 6/26 MRSA PCR: pos  Isaac BlissMichael Laurier Jasperson, PharmD, BCPS, [facility] Clinical Pharmacist Pager [phone] 10/26/2015 8:59 AM

## 2015-10-26 NOTE — Progress Notes (Signed)
Late entry. Subjective: 3 Days Post-Op Procedure(s) (LRB): IRRIGATION AND DEBRIDEMENT EXTREMITY/HAND AND ARM (Bilateral) IRRIGATION AND DEBRIDEMENT FOOT (Left) Intubated/sedated.  Objective: Vital signs in last 24 hours: Temp:  [97.7 F (36.5 C)-101.1 F (38.4 C)] 99.5 F (37.5 C) (06/30 2200) Pulse Rate:  [117-148] 130 (06/30 2200) Resp:  [11-37] 17 (06/30 2320) BP: (107-159)/(57-89) 137/71 mmHg (06/30 2200) SpO2:  [92 %-100 %] 100 % (06/30 2320) FiO2 (%):  [40 %-50 %] 50 % (06/30 2320) Weight:  [93.8 kg (206 lb 12.7 oz)] 93.8 kg (206 lb 12.7 oz) (06/30 0500)  Intake/Output from previous day: 06/29 0701 - 06/30 0700 In: 5806.2 [I.V.:3851.2; NG/GT:1125; IV Piggyback:800] Out: 1985 [Urine:1955; Emesis/NG output:30] Intake/Output this shift: Total I/O In: 840 [I.V.:215; NG/GT:375; IV Piggyback:250] Out: 500 [Urine:500]   Recent Labs  10/24/15 0926 10/25/15 0648 10/26/15 0430  HGB 11.1* 10.6* 9.7*    Recent Labs  10/25/15 0648 10/26/15 0430  WBC 17.2* 14.5*  RBC 3.90* 3.58*  HCT 32.8* 31.1*  PLT 108* 115*    Recent Labs  10/25/15 0440 10/26/15 0430  NA 135 136  K 3.5 3.8  CL 99* 99*  CO2 28 28  BUN 11 10  CREATININE 0.41* 0.49*  GLUCOSE 102* 108*  CALCIUM 7.7* 7.7*   No results for input(s): LABPT, INR in the last 72 hours.  dressings clean/dry/intact.  no proximal erythema.  edema of upper arm/elbow on right.  no fluctuance.  no erythema.  Assessment/Plan: 3 Days Post-Op Procedure(s) (LRB): IRRIGATION AND DEBRIDEMENT EXTREMITY/HAND AND ARM (Bilateral) IRRIGATION AND DEBRIDEMENT FOOT (Left) Continue hydrotherapy.  Aleese Kamps R 10/26/2015, 11:27 PM

## 2015-10-26 NOTE — Progress Notes (Signed)
Physical Therapy Wound Treatment Patient Details  Name: Kevin Austin MRN: [id number] Date of Birth: 09/11/1990  Today's Date: 10/26/2015 Time: 2706-2376 Time Calculation (min): 40 min  Subjective  Subjective: Pt sedated during hydro session Patient and Family Stated Goals: None stated Date of Onset:  (Unknown) Prior Treatments: I&D 6/27  Pain Score:  No signs of pain   Wound Assessment  Wound / Incision (Open or Dehisced) 10/25/15 Incision - Open Hand Right Dorsal aspect - Ulnar side (Active)  Dressing Type Compression wrap;Gauze (Comment);Moist to dry 10/26/2015  8:50 AM  Dressing Changed Changed 10/26/2015  8:50 AM  Dressing Status Clean;Dry;Intact 10/26/2015  8:50 AM  Dressing Change Frequency Daily 10/26/2015  8:50 AM  Site / Wound Assessment Bleeding;Red;Pink 10/26/2015  8:50 AM  % Wound base Red or Granulating 100% 10/26/2015  8:50 AM  % Wound base Yellow 0% 10/26/2015  8:50 AM  % Wound base Black 0% 10/26/2015  8:50 AM  % Wound base Other (Comment) 0% 10/26/2015  8:50 AM  Peri-wound Assessment Erythema (blanchable) 10/26/2015  8:50 AM  Wound Length (cm) 6.5 cm 10/25/2015  9:37 AM  Wound Width (cm) 1.5 cm 10/25/2015  9:37 AM  Wound Depth (cm) 1.2 cm 10/25/2015  9:37 AM  Undermining (cm) 3-1 o'clock 2.9 cm, 7.-10 o'clock 4.2 cm (connecting), 11-12 o'clock 3.5 cm 10/25/2015  9:37 AM  Margins Unattached edges (unapproximated) 10/26/2015  8:50 AM  Closure None 10/26/2015  8:50 AM  Drainage Amount Moderate 10/26/2015  8:50 AM  Drainage Description Serosanguineous 10/26/2015  8:50 AM  Non-staged Wound Description Not applicable 2/83/1517  6:16 AM  Treatment Hydrotherapy (Pulse lavage);Packing (Impregnated strip) 10/26/2015  8:50 AM     Wound / Incision (Open or Dehisced) 10/25/15 Incision - Open Hand Right Dorsal aspect - Radial side (Active)  Dressing Type Compression wrap;Gauze (Comment);Moist to dry 10/26/2015  8:50 AM  Dressing Changed Changed 10/26/2015  8:50 AM  Dressing Status Clean;Dry;Intact  10/26/2015  8:50 AM  Dressing Change Frequency Daily 10/26/2015  8:50 AM  Site / Wound Assessment Red;Bleeding 10/26/2015  8:50 AM  % Wound base Red or Granulating 100% 10/26/2015  8:50 AM  % Wound base Yellow 0% 10/26/2015  8:50 AM  % Wound base Black 0% 10/26/2015  8:50 AM  Wound Length (cm) 5 cm 10/25/2015  9:37 AM  Wound Width (cm) 1 cm 10/25/2015  9:37 AM  Wound Depth (cm) 1.2 cm 10/25/2015  9:37 AM  Undermining (cm) 1-5 o'clock 4.5 cm (connecting), 9 o'clock 1.6 cm  10/25/2015  9:37 AM  Margins Unattached edges (unapproximated) 10/26/2015  8:50 AM  Closure None 10/26/2015  8:50 AM  Drainage Amount Moderate 10/26/2015  8:50 AM  Drainage Description Serosanguineous 10/26/2015  8:50 AM  Non-staged Wound Description Not applicable 0/73/7106  2:69 AM  Treatment Hydrotherapy (Pulse lavage);Packing (Impregnated strip) 10/26/2015  8:50 AM     Wound / Incision (Open or Dehisced) 10/25/15 Incision - Open Hand Left Dorsal aspect (Active)  Dressing Type Compression wrap;Gauze (Comment);Moist to dry 10/26/2015  8:50 AM  Dressing Changed Changed 10/26/2015  8:50 AM  Dressing Status Clean;Dry;Intact 10/26/2015  8:50 AM  Dressing Change Frequency Daily 10/26/2015  8:50 AM  Site / Wound Assessment Bleeding;Red 10/26/2015  8:50 AM  % Wound base Red or Granulating 100% 10/26/2015  8:50 AM  % Wound base Yellow 0% 10/26/2015  8:50 AM  % Wound base Black 0% 10/26/2015  8:50 AM  Peri-wound Assessment Erythema (blanchable);Intact 10/26/2015  8:50 AM  Wound Length (cm) 4 cm  10/25/2015  9:37 AM  Wound Width (cm) 0.8 cm 10/25/2015  9:37 AM  Wound Depth (cm) 1.5 cm 10/25/2015  9:37 AM  Undermining (cm) 2-4 o'clock 2.7 cm, 10 o'clock 2.1 cm 10/25/2015  9:37 AM  Margins Unattached edges (unapproximated) 10/26/2015  8:50 AM  Closure None 10/26/2015  8:50 AM  Drainage Amount Moderate 10/26/2015  8:50 AM  Drainage Description Serosanguineous 10/26/2015  8:50 AM  Non-staged Wound Description Not applicable 0/48/8891  6:94 AM  Treatment  Hydrotherapy (Pulse lavage);Packing (Impregnated strip) 10/25/2015  9:37 AM     Wound / Incision (Open or Dehisced) 10/25/15 Incision - Open Hand Left Palmar aspect (Active)  Dressing Type Compression wrap;Gauze (Comment);Moist to dry 10/26/2015  8:50 AM  Dressing Changed Changed 10/26/2015  8:50 AM  Dressing Status Clean;Dry;Intact 10/26/2015  8:50 AM  Dressing Change Frequency Daily 10/26/2015  8:50 AM  Site / Wound Assessment Bleeding;Red 10/26/2015  8:50 AM  % Wound base Red or Granulating 100% 10/26/2015  8:50 AM  % Wound base Yellow 0% 10/26/2015  8:50 AM  % Wound base Black 0% 10/26/2015  8:50 AM  Peri-wound Assessment Intact 10/26/2015  8:50 AM  Wound Length (cm) 0.3 cm 10/25/2015  9:37 AM  Wound Width (cm) 4.1 cm 10/25/2015  9:37 AM  Wound Depth (cm) 1.8 cm 10/25/2015  9:37 AM  Undermining (cm) 7 o'clock 3.0 cm, 9 o'clock 2.2 cm, 1 o'clock .7 cm, 3-6 o'clock 1.2 cm 10/25/2015  9:37 AM  Margins Unattached edges (unapproximated) 10/26/2015  8:50 AM  Closure None 10/26/2015  8:50 AM  Drainage Amount Moderate 10/26/2015  8:50 AM  Drainage Description Serosanguineous 10/26/2015  8:50 AM  Non-staged Wound Description Not applicable 08/28/8880  8:00 AM  Treatment Hydrotherapy (Pulse lavage);Packing (Impregnated strip) 10/26/2015  8:50 AM  Hydrotherapy Pulsed lavage therapy - wound location: Bilateral hands Pulsed Lavage with Suction (psi): 4 psi Pulsed Lavage with Suction - Normal Saline Used: 1000 mL Pulsed Lavage Tip: Tip with splash shield   Wound Assessment and Plan  Wound Therapy - Assess/Plan/Recommendations Wound Therapy - Clinical Statement: Wound beds remain clean. Wound Therapy - Functional Problem List: Decreased strength/AROM of hands/fingers for ADL's and fine motor tasks.  Factors Delaying/Impairing Wound Healing: Substance abuse;Multiple medical problems Hydrotherapy Plan: Patient/family education;Pulsatile lavage with suction;Dressing change Wound Therapy - Frequency: 6X / week Wound  Therapy - Follow Up Recommendations: Other (comment) (Per hand surgeon) Wound Plan: See above  Wound Therapy Goals- Improve the function of patient's integumentary system by progressing the wound(s) through the phases of wound healing (inflammation - proliferation - remodeling) by: Patient/Family will be able to : complete dressing changes independently prior to d/c Patient/Family Instruction Goal - Progress: Not progressing Goals/treatment plan/discharge plan were made with and agreed upon by patient/family: No, Patient unable to participate in goals/treatment/discharge plan and family unavailable Time For Goal Achievement: 7 days Wound Therapy - Potential for Goals: Excellent  Goals will be updated until maximal potential achieved or discharge criteria met.  Discharge criteria: when goals achieved, discharge from hospital, MD decision/surgical intervention, no progress towards goals, refusal/missing three consecutive treatments without notification or medical reason.  GP     Chaitanya Amedee 10/26/2015, 9:45 AM [facility] PT [phone]

## 2015-10-27 ENCOUNTER — Encounter (HOSPITAL_COMMUNITY)
Admission: AD | Disposition: A | Discharge status: Home Health | Payer: Self-pay | Source: Other Acute Inpatient Hospital | Attending: Pulmonary Disease

## 2015-10-27 ENCOUNTER — Inpatient Hospital Stay (HOSPITAL_COMMUNITY): Payer: Medicaid Other

## 2015-10-27 ENCOUNTER — Inpatient Hospital Stay (HOSPITAL_COMMUNITY): Payer: Medicaid Other | Admitting: Anesthesiology

## 2015-10-27 DIAGNOSIS — A4902 Methicillin resistant Staphylococcus aureus infection, unspecified site: Secondary | ICD-10-CM

## 2015-10-27 DIAGNOSIS — Z9911 Dependence on respirator [ventilator] status: Secondary | ICD-10-CM

## 2015-10-27 HISTORY — PX: I & D EXTREMITY: SHX5045

## 2015-10-27 LAB — GLUCOSE, CAPILLARY
GLUCOSE-CAPILLARY: 125 mg/dL — AB (ref 65–99)
GLUCOSE-CAPILLARY: 112 mg/dL — AB (ref 65–99)
GLUCOSE-CAPILLARY: 108 mg/dL — AB (ref 65–99)
GLUCOSE-CAPILLARY: 109 mg/dL — AB (ref 65–99)
Glucose-Capillary: 111 mg/dL — ABNORMAL HIGH (ref 65–99)

## 2015-10-27 LAB — BASIC METABOLIC PANEL
ANION GAP: 5 (ref 5–15)
Anion gap: 5 (ref 5–15)
BUN: 15 mg/dL (ref 6–20)
BUN: 18 mg/dL (ref 6–20)
CALCIUM: 7.9 mg/dL — AB (ref 8.9–10.3)
CHLORIDE: 100 mmol/L — AB (ref 101–111)
CO2: 32 mmol/L (ref 22–32)
CO2: 33 mmol/L — ABNORMAL HIGH (ref 22–32)
CREATININE: 0.49 mg/dL — AB (ref 0.61–1.24)
Calcium: 7.8 mg/dL — ABNORMAL LOW (ref 8.9–10.3)
Chloride: 101 mmol/L (ref 101–111)
Creatinine, Ser: 0.54 mg/dL — ABNORMAL LOW (ref 0.61–1.24)
GFR calc Af Amer: >60 mL/min (ref >60)
GFR calc Af Amer: >60 mL/min (ref >60)
GLUCOSE: 115 mg/dL — AB (ref 65–99)
Glucose, Bld: 119 mg/dL — ABNORMAL HIGH (ref 65–99)
POTASSIUM: 3.9 mmol/L (ref 3.5–5.1)
POTASSIUM: 4.3 mmol/L (ref 3.5–5.1)
SODIUM: 139 mmol/L (ref 135–145)
Sodium: 137 mmol/L (ref 135–145)

## 2015-10-27 LAB — CBC WITH DIFFERENTIAL/PLATELET
BASOS ABS: 0 10*3/uL (ref 0.0–0.1)
Basophils Relative: 0 %
EOS PCT: 1 %
Eosinophils Absolute: 0.1 10*3/uL (ref 0.0–0.7)
HCT: 29.5 % — ABNORMAL LOW (ref 39.0–52.0)
Hemoglobin: 9.2 g/dL — ABNORMAL LOW (ref 13.0–17.0)
Lymphocytes Relative: 7 %
Lymphs Abs: 0.9 10*3/uL (ref 0.7–4.0)
MCH: 27.1 pg (ref 26.0–34.0)
MCHC: 31.2 g/dL (ref 30.0–36.0)
MCV: 87 fL (ref 78.0–100.0)
MONO ABS: 0.9 10*3/uL (ref 0.1–1.0)
MONOS PCT: 7 %
NEUTROS PCT: 85 %
Neutro Abs: 11.4 10*3/uL — ABNORMAL HIGH (ref 1.7–7.7)
PLATELETS: 140 10*3/uL — AB (ref 150–400)
RBC: 3.39 MIL/uL — AB (ref 4.22–5.81)
RDW: 16.4 % — AB (ref 11.5–15.5)
WBC: 13.3 10*3/uL — AB (ref 4.0–10.5)

## 2015-10-27 LAB — MAGNESIUM
MAGNESIUM: 2 mg/dL (ref 1.7–2.4)
Magnesium: 2 mg/dL (ref 1.7–2.4)

## 2015-10-27 LAB — RENAL FUNCTION PANEL
ALBUMIN: 1.1 g/dL — AB (ref 3.5–5.0)
Anion gap: 5 (ref 5–15)
BUN: 15 mg/dL (ref 6–20)
CHLORIDE: 100 mmol/L — AB (ref 101–111)
CO2: 32 mmol/L (ref 22–32)
Calcium: 7.9 mg/dL — ABNORMAL LOW (ref 8.9–10.3)
Creatinine, Ser: 0.49 mg/dL — ABNORMAL LOW (ref 0.61–1.24)
GFR calc Af Amer: >60 mL/min (ref >60)
GFR calc non Af Amer: >60 mL/min (ref >60)
GLUCOSE: 115 mg/dL — AB (ref 65–99)
POTASSIUM: 3.9 mmol/L (ref 3.5–5.1)
Phosphorus: 4.5 mg/dL (ref 2.5–4.6)
Sodium: 137 mmol/L (ref 135–145)

## 2015-10-27 LAB — PHOSPHORUS: Phosphorus: 5 mg/dL — ABNORMAL HIGH (ref 2.5–4.6)

## 2015-10-27 LAB — VANCOMYCIN, TROUGH: VANCOMYCIN TR: 20 ug/mL (ref 15–20)

## 2015-10-27 LAB — TRIGLYCERIDES: TRIGLYCERIDES: 330 mg/dL — AB (ref <150)

## 2015-10-27 SURGERY — IRRIGATION AND DEBRIDEMENT EXTREMITY
Anesthesia: General | Site: Ankle | Laterality: Right

## 2015-10-27 MED ORDER — MEPERIDINE HCL 25 MG/ML IJ SOLN: 6.2500 mg | INTRAMUSCULAR | Status: DC | PRN

## 2015-10-27 MED ORDER — ROCURONIUM BROMIDE 100 MG/10ML IV SOLN
INTRAVENOUS | Status: DC | PRN
  Administered 2015-10-27: 25 mg via INTRAVENOUS

## 2015-10-27 MED ORDER — HYDROMORPHONE HCL 1 MG/ML IJ SOLN: 0.2500 mg | INTRAMUSCULAR | Status: DC | PRN

## 2015-10-27 MED ORDER — ONDANSETRON HCL 4 MG/2ML IJ SOLN: 4.0000 mg | Freq: Once | INTRAMUSCULAR | Status: DC | PRN

## 2015-10-27 MED ORDER — PROMETHAZINE HCL 25 MG/ML IJ SOLN: 6.2500 mg | INTRAMUSCULAR | Status: DC | PRN

## 2015-10-27 MED ORDER — SODIUM CHLORIDE 0.9 % IR SOLN
Status: DC | PRN
  Administered 2015-10-27 (×3): 3000 mL

## 2015-10-27 SURGICAL SUPPLY — 63 items
BANDAGE ACE 4X5 VEL STRL LF (GAUZE/BANDAGES/DRESSINGS) ×2 IMPLANT
BANDAGE ELASTIC 3 VELCRO ST LF (GAUZE/BANDAGES/DRESSINGS) IMPLANT
BLADE SURG 10 STRL SS (BLADE) ×3 IMPLANT
BNDG COHESIVE 1X5 TAN STRL LF (GAUZE/BANDAGES/DRESSINGS) IMPLANT
BNDG COHESIVE 4X5 TAN STRL (GAUZE/BANDAGES/DRESSINGS) ×3 IMPLANT
BNDG COHESIVE 6X5 TAN STRL LF (GAUZE/BANDAGES/DRESSINGS) ×6 IMPLANT
BNDG CONFORM 3 STRL LF (GAUZE/BANDAGES/DRESSINGS) IMPLANT
BNDG GAUZE ELAST 4 BULKY (GAUZE/BANDAGES/DRESSINGS) ×2 IMPLANT
BNDG GAUZE STRTCH 6 (GAUZE/BANDAGES/DRESSINGS) ×9 IMPLANT
CORDS BIPOLAR (ELECTRODE) IMPLANT
COVER SURGICAL LIGHT HANDLE (MISCELLANEOUS) ×3 IMPLANT
CUFF TOURNIQUET SINGLE 18IN (TOURNIQUET CUFF) ×3 IMPLANT
CUFF TOURNIQUET SINGLE 24IN (TOURNIQUET CUFF) IMPLANT
CUFF TOURNIQUET SINGLE 34IN LL (TOURNIQUET CUFF) ×2 IMPLANT
CUFF TOURNIQUET SINGLE 44IN (TOURNIQUET CUFF) IMPLANT
DRAPE ORTHO SPLIT 77X108 STRL (DRAPES) ×6
DRAPE SURG 17X23 STRL (DRAPES) ×2 IMPLANT
DRAPE SURG ORHT 6 SPLT 77X108 (DRAPES) ×2 IMPLANT
DRAPE U-SHAPE 47X51 STRL (DRAPES) ×3 IMPLANT
DURAPREP 26ML APPLICATOR (WOUND CARE) ×1 IMPLANT
ELECT CAUTERY BLADE 6.4 (BLADE) IMPLANT
ELECT REM PT RETURN 9FT ADLT (ELECTROSURGICAL)
ELECTRODE REM PT RTRN 9FT ADLT (ELECTROSURGICAL) IMPLANT
GAUZE SPONGE 4X4 12PLY STRL (GAUZE/BANDAGES/DRESSINGS) ×7 IMPLANT
GAUZE XEROFORM 1X8 LF (GAUZE/BANDAGES/DRESSINGS) ×3 IMPLANT
GAUZE XEROFORM 5X9 LF (GAUZE/BANDAGES/DRESSINGS) ×2 IMPLANT
GLOVE BIO SURGEON STRL SZ8 (GLOVE) ×3 IMPLANT
GLOVE BIOGEL PI IND STRL 8 (GLOVE) ×2 IMPLANT
GLOVE BIOGEL PI INDICATOR 8 (GLOVE) ×4
GLOVE ORTHO TXT STRL SZ7.5 (GLOVE) ×3 IMPLANT
GOWN STRL REUS W/ TWL LRG LVL3 (GOWN DISPOSABLE) ×1 IMPLANT
GOWN STRL REUS W/ TWL XL LVL3 (GOWN DISPOSABLE) ×4 IMPLANT
GOWN STRL REUS W/TWL LRG LVL3 (GOWN DISPOSABLE) ×3
GOWN STRL REUS W/TWL XL LVL3 (GOWN DISPOSABLE) ×12
HANDPIECE INTERPULSE COAX TIP (DISPOSABLE)
KIT BASIN OR (CUSTOM PROCEDURE TRAY) ×3 IMPLANT
KIT ROOM TURNOVER OR (KITS) ×3 IMPLANT
MANIFOLD NEPTUNE II (INSTRUMENTS) ×3 IMPLANT
NS IRRIG 1000ML POUR BTL (IV SOLUTION) ×3 IMPLANT
PACK ORTHO EXTREMITY (CUSTOM PROCEDURE TRAY) ×3 IMPLANT
PAD ARMBOARD 7.5X6 YLW CONV (MISCELLANEOUS) ×6 IMPLANT
PAD CAST 4YDX4 CTTN HI CHSV (CAST SUPPLIES) IMPLANT
PADDING CAST ABS 4INX4YD NS (CAST SUPPLIES) ×4
PADDING CAST ABS COTTON 4X4 ST (CAST SUPPLIES) ×2 IMPLANT
PADDING CAST COTTON 4X4 STRL (CAST SUPPLIES) ×3
PADDING CAST COTTON 6X4 STRL (CAST SUPPLIES) ×3 IMPLANT
SET HNDPC FAN SPRY TIP SCT (DISPOSABLE) IMPLANT
SPONGE LAP 18X18 X RAY DECT (DISPOSABLE) ×7 IMPLANT
STOCKINETTE IMPERVIOUS 9X36 MD (GAUZE/BANDAGES/DRESSINGS) ×1 IMPLANT
SUCTION FRAZIER TIP 8 FR DISP (SUCTIONS) ×2
SUCTION TUBE FRAZIER 8FR DISP (SUCTIONS) IMPLANT
SUT ETHILON 2 0 FS 18 (SUTURE) ×7 IMPLANT
SUT ETHILON 3 0 PS 1 (SUTURE) ×2 IMPLANT
SYR CONTROL 10ML LL (SYRINGE) IMPLANT
TOWEL OR 17X24 6PK STRL BLUE (TOWEL DISPOSABLE) ×3 IMPLANT
TOWEL OR 17X26 10 PK STRL BLUE (TOWEL DISPOSABLE) ×5 IMPLANT
TUBE ANAEROBIC SPECIMEN COL (MISCELLANEOUS) IMPLANT
TUBE CONNECTING 12'X1/4 (SUCTIONS) ×4
TUBE CONNECTING 12X1/4 (SUCTIONS) ×5 IMPLANT
TUBE FEEDING 5FR 15 INCH (TUBING) IMPLANT
UNDERPAD 30X30 INCONTINENT (UNDERPADS AND DIAPERS) ×5 IMPLANT
WATER STERILE IRR 1000ML POUR (IV SOLUTION) ×3 IMPLANT
YANKAUER SUCT BULB TIP NO VENT (SUCTIONS) ×5 IMPLANT

## 2015-10-27 NOTE — Anesthesia Postprocedure Evaluation (Signed)
Anesthesia Post Note  Patient: Kevin Austin  Procedure(s) Performed: Procedure(s) (LRB): IRRIGATION AND DEBRIDEMENT EXTREMITY (Right)  Patient location during evaluation: ICU Anesthesia Type: General Level of consciousness: sedated and patient remains intubated per anesthesia plan Vital Signs Assessment: post-procedure vital signs reviewed and stable Respiratory status: patient on ventilator - see flowsheet for VS and patient remains intubated per anesthesia plan Cardiovascular status: blood pressure returned to baseline and stable Anesthetic complications: no    Last Vitals:  Filed Vitals:   10/27/15 1300 10/27/15 1400  BP: 123/64 124/68  Pulse: 132 130  Temp: 37.6 C 37.4 C  Resp: 17 22    Last Pain:  Filed Vitals:   10/27/15 1447  PainSc: Asleep                 [name]

## 2015-10-27 NOTE — Progress Notes (Signed)
Patient ID: Kevin Austin, male   DOB: 1991-01-03, 25 y.o.   MRN: [id number] I can by to see Mr. Reznik at the request of Montez MoritaKeith Paul, PA-C.  His right ankle is now quite swollen and tender with redness.  He is intubated, but withdraws significantly in regards to his right ankle.  We then cleaned his ankle with betadine and then aspirated gross purulence from his right ankle joint.  I spoke with Dr. Marchelle Gearingamaswamy and then spoke to his mother about the need for an urgent irrigation and debridement of his right ankle joint and surrounding tissues.  Will proceed to the OR today.  Informed consent is obtained over the phone.

## 2015-10-27 NOTE — Progress Notes (Signed)
[facility]   Reason for visit: Follow up on disseminated MRSA.  Interval History: remains intubated,  Afebrile nearly 24 hours.    Physical Exam: Constitutional:  Filed Vitals:   10/27/15 1000 10/27/15 1100  BP: 103/59 103/57  Pulse: 135 133  Temp: 99.5 F (37.5 C) 99.7 F (37.6 C)  Resp: 18 16   intbated, sedated Respiratory: on vent; CTA B Cardiovascular: RRR  Review of Systems: Unable to be assessed due to mental status  Lab Results  Component Value Date   WBC 13.3* 10/27/2015   HGB 9.2* 10/27/2015   HCT 29.5* 10/27/2015   MCV 87.0 10/27/2015   PLT 140* 10/27/2015    Lab Results  Component Value Date   CREATININE 0.49* 10/27/2015   CREATININE 0.49* 10/27/2015   BUN 15 10/27/2015   BUN 15 10/27/2015   NA 137 10/27/2015   NA 137 10/27/2015   K 3.9 10/27/2015   K 3.9 10/27/2015   CL 100* 10/27/2015   CL 100* 10/27/2015   CO2 32 10/27/2015   CO2 32 10/27/2015    Lab Results  Component Value Date   ALT 20 10/26/2015   AST 46* 10/26/2015   ALKPHOS 282* 10/26/2015     Microbiology: Recent Results (from the past 240 hour(s))  MRSA PCR Screening     Status: Abnormal   Collection Time: 10/22/15  5:41 PM  Result Value Ref Range Status   MRSA by PCR POSITIVE (A) NEGATIVE Final    Comment:        The GeneXpert MRSA Assay (FDA approved for NASAL specimens only), is one component of a comprehensive MRSA colonization surveillance program. It is not intended to diagnose MRSA infection nor to guide or monitor treatment for MRSA infections. RESULT CALLED TO, READ BACK BY AND VERIFIED WITH: A THOMPSON,RN @0110  10/23/15 MKELLY   Urine culture     Status: Abnormal   Collection Time: 10/22/15  6:38 PM  Result Value Ref Range Status   Specimen Description URINE, CATHETERIZED  Final   Special Requests NONE  Final   Culture 2,000 COLONIES/mL INSIGNIFICANT GROWTH (A)  Final   Report Status 10/24/2015 FINAL  Final  Culture, blood  (Routine X 2) w Reflex to ID Panel     Status: Abnormal   Collection Time: 10/22/15  6:44 PM  Result Value Ref Range Status   Specimen Description BLOOD LEFT HAND  Final   Special Requests BOTTLES DRAWN AEROBIC AND ANAEROBIC 5 CC   Final   Culture  Setup Time   Final    GRAM POSITIVE COCCI IN CLUSTERS IN BOTH AEROBIC AND ANAEROBIC BOTTLES CRITICAL RESULT CALLED TO, READ BACK BY AND VERIFIED WITH: C STEWART,PHARMD AT 1040 10/23/15 BY L BENFIELD    Culture (A)  Final    STAPHYLOCOCCUS AUREUS SUSCEPTIBILITIES PERFORMED ON PREVIOUS CULTURE WITHIN THE LAST 5 DAYS.    Report Status 10/25/2015 FINAL  Final  Culture, blood (Routine X 2) w Reflex to ID Panel     Status: Abnormal   Collection Time: 10/22/15  7:02 PM  Result Value Ref Range Status   Specimen Description BLOOD LEFT HAND  Final   Special Requests BOTTLES DRAWN AEROBIC AND ANAEROBIC 5 CC   Final   Culture  Setup Time   Final    GRAM POSITIVE COCCI IN CLUSTERS IN BOTH AEROBIC AND ANAEROBIC BOTTLES CRITICAL RESULT CALLED TO, READ BACK BY AND VERIFIED WITH: C STEWART,PHARMD AT 1040 10/23/15 BY L BENFIELD  Culture METHICILLIN RESISTANT STAPHYLOCOCCUS AUREUS (A)  Final   Report Status 10/25/2015 FINAL  Final   Organism ID, Bacteria METHICILLIN RESISTANT STAPHYLOCOCCUS AUREUS  Final      Susceptibility   Methicillin resistant staphylococcus aureus - MIC*    CIPROFLOXACIN <=0.5 SENSITIVE Sensitive     ERYTHROMYCIN >=8 RESISTANT Resistant     GENTAMICIN <=0.5 SENSITIVE Sensitive     OXACILLIN >=4 RESISTANT Resistant     TETRACYCLINE <=1 SENSITIVE Sensitive     VANCOMYCIN <=0.5 SENSITIVE Sensitive     TRIMETH/SULFA <=10 SENSITIVE Sensitive     CLINDAMYCIN <=0.25 SENSITIVE Sensitive     RIFAMPIN <=0.5 SENSITIVE Sensitive     Inducible Clindamycin NEGATIVE Sensitive     * METHICILLIN RESISTANT STAPHYLOCOCCUS AUREUS  Blood Culture ID Panel (Reflexed)     Status: Abnormal   Collection Time: 10/22/15  7:02 PM  Result Value Ref Range  Status   Enterococcus species NOT DETECTED NOT DETECTED Final   Vancomycin resistance NOT DETECTED NOT DETECTED Final   Listeria monocytogenes NOT DETECTED NOT DETECTED Final   Staphylococcus species DETECTED (A) NOT DETECTED Final    Comment: CRITICAL RESULT CALLED TO, READ BACK BY AND VERIFIED WITH: C STEWART,PHARMD AT 1040 10/23/15 BY L BENFIELD    Staphylococcus aureus DETECTED (A) NOT DETECTED Final    Comment: CRITICAL RESULT CALLED TO, READ BACK BY AND VERIFIED WITH: C STEWART,PHARMD AT 1040 10/23/15 BY L BENFIELD    Methicillin resistance DETECTED (A) NOT DETECTED Final    Comment: CRITICAL RESULT CALLED TO, READ BACK BY AND VERIFIED WITH: C STEWART,PHARMD AT 1040 10/23/15 BY L BENFIELD    Streptococcus species NOT DETECTED NOT DETECTED Final   Streptococcus agalactiae NOT DETECTED NOT DETECTED Final   Streptococcus pneumoniae NOT DETECTED NOT DETECTED Final   Streptococcus pyogenes NOT DETECTED NOT DETECTED Final   Acinetobacter baumannii NOT DETECTED NOT DETECTED Final   Enterobacteriaceae species NOT DETECTED NOT DETECTED Final   Enterobacter cloacae complex NOT DETECTED NOT DETECTED Final   Escherichia coli NOT DETECTED NOT DETECTED Final   Klebsiella oxytoca NOT DETECTED NOT DETECTED Final   Klebsiella pneumoniae NOT DETECTED NOT DETECTED Final   Proteus species NOT DETECTED NOT DETECTED Final   Serratia marcescens NOT DETECTED NOT DETECTED Final   Carbapenem resistance NOT DETECTED NOT DETECTED Final   Haemophilus influenzae NOT DETECTED NOT DETECTED Final   Neisseria meningitidis NOT DETECTED NOT DETECTED Final   Pseudomonas aeruginosa NOT DETECTED NOT DETECTED Final   Candida albicans NOT DETECTED NOT DETECTED Final   Candida glabrata NOT DETECTED NOT DETECTED Final   Candida krusei NOT DETECTED NOT DETECTED Final   Candida parapsilosis NOT DETECTED NOT DETECTED Final   Candida tropicalis NOT DETECTED NOT DETECTED Final  Fungus Culture With Stain (Not [facility])      Status: None (Preliminary result)   Collection Time: 10/23/15  7:09 PM  Result Value Ref Range Status   Fungus Stain Final report  Final    Comment: (NOTE) Performed At: [facility] [address] [place], [place] [id number] Mila HomerHancock William F MD NF:[id number]:[phone]    Fungus (Mycology) Culture PENDING  Incomplete   Fungal Source ABSCESS  Final    Comment: RIGHT HAND   Aerobic/Anaerobic Culture (surgical/deep wound)     Status: None (Preliminary result)   Collection Time: 10/23/15  7:09 PM  Result Value Ref Range Status   Specimen Description ABSCESS HAND RIGHT  Final   Special Requests PT ON VANC FORTAZ ZOSYN  Final  Gram Stain   Final    MODERATE WBC PRESENT,BOTH PMN AND MONONUCLEAR FEW GRAM POSITIVE COCCI IN PAIRS IN CLUSTERS    Culture   Final    MODERATE METHICILLIN RESISTANT STAPHYLOCOCCUS AUREUS NO ANAEROBES ISOLATED; CULTURE IN PROGRESS FOR 5 DAYS    Report Status PENDING  Incomplete   Organism ID, Bacteria METHICILLIN RESISTANT STAPHYLOCOCCUS AUREUS  Final      Susceptibility   Methicillin resistant staphylococcus aureus - MIC*    CIPROFLOXACIN <=0.5 SENSITIVE Sensitive     ERYTHROMYCIN >=8 RESISTANT Resistant     GENTAMICIN <=0.5 SENSITIVE Sensitive     OXACILLIN >=4 RESISTANT Resistant     TETRACYCLINE <=1 SENSITIVE Sensitive     VANCOMYCIN <=0.5 SENSITIVE Sensitive     TRIMETH/SULFA <=10 SENSITIVE Sensitive     CLINDAMYCIN <=0.25 SENSITIVE Sensitive     RIFAMPIN <=0.5 SENSITIVE Sensitive     Inducible Clindamycin NEGATIVE Sensitive     * MODERATE METHICILLIN RESISTANT STAPHYLOCOCCUS AUREUS  Fungus Culture Result     Status: None   Collection Time: 10/23/15  7:09 PM  Result Value Ref Range Status   Result 1 Comment  Final    Comment: (NOTE) KOH/Calcofluor preparation:  no fungus observed. Performed At: [facility] [address] [place], [place] [id number] Mila Homer MD WU:[id number]   Aerobic/Anaerobic Culture (surgical/deep wound)      Status: None (Preliminary result)   Collection Time: 10/23/15  7:17 PM  Result Value Ref Range Status   Specimen Description ABSCESS LEFT FOOT  Final   Special Requests PT ON VANC FORTAZ ZOSYN  Final   Gram Stain   Final    MODERATE WBC PRESENT,BOTH PMN AND MONONUCLEAR FEW GRAM POSITIVE COCCI IN CLUSTERS    Culture   Final    FEW METHICILLIN RESISTANT STAPHYLOCOCCUS AUREUS NO ANAEROBES ISOLATED; CULTURE IN PROGRESS FOR 5 DAYS    Report Status PENDING  Incomplete   Organism ID, Bacteria METHICILLIN RESISTANT STAPHYLOCOCCUS AUREUS  Final      Susceptibility   Methicillin resistant staphylococcus aureus - MIC*    CIPROFLOXACIN <=0.5 SENSITIVE Sensitive     ERYTHROMYCIN >=8 RESISTANT Resistant     GENTAMICIN <=0.5 SENSITIVE Sensitive     OXACILLIN >=4 RESISTANT Resistant     TETRACYCLINE <=1 SENSITIVE Sensitive     VANCOMYCIN <=0.5 SENSITIVE Sensitive     TRIMETH/SULFA <=10 SENSITIVE Sensitive     CLINDAMYCIN <=0.25 SENSITIVE Sensitive     RIFAMPIN <=0.5 SENSITIVE Sensitive     Inducible Clindamycin NEGATIVE Sensitive     * FEW METHICILLIN RESISTANT STAPHYLOCOCCUS AUREUS  Culture, respiratory (NON-Expectorated)     Status: None   Collection Time: 10/24/15  3:29 AM  Result Value Ref Range Status   Specimen Description TRACHEAL ASPIRATE  Final   Special Requests Normal  Final   Gram Stain   Final    ABUNDANT WBC PRESENT, PREDOMINANTLY PMN RARE SQUAMOUS EPITHELIAL CELLS PRESENT RARE GRAM POSITIVE COCCI IN CLUSTERS    Culture FEW METHICILLIN RESISTANT STAPHYLOCOCCUS AUREUS  Final   Report Status 10/26/2015 FINAL  Final   Organism ID, Bacteria METHICILLIN RESISTANT STAPHYLOCOCCUS AUREUS  Final      Susceptibility   Methicillin resistant staphylococcus aureus - MIC*    CIPROFLOXACIN <=0.5 SENSITIVE Sensitive     ERYTHROMYCIN >=8 RESISTANT Resistant     GENTAMICIN <=0.5 SENSITIVE Sensitive     OXACILLIN >=4 RESISTANT Resistant     TETRACYCLINE <=1 SENSITIVE Sensitive      VANCOMYCIN 1 SENSITIVE Sensitive  TRIMETH/SULFA <=10 SENSITIVE Sensitive     CLINDAMYCIN <=0.25 SENSITIVE Sensitive     RIFAMPIN <=0.5 SENSITIVE Sensitive     Inducible Clindamycin NEGATIVE Sensitive     * FEW METHICILLIN RESISTANT STAPHYLOCOCCUS AUREUS  Culture, blood (routine x 2)     Status: None (Preliminary result)   Collection Time: 10/24/15  1:51 PM  Result Value Ref Range Status   Specimen Description BLOOD RIGHT ARM  Final   Special Requests BOTTLES DRAWN AEROBIC AND ANAEROBIC  7CC   Final   Culture NO GROWTH 2 DAYS  Final   Report Status PENDING  Incomplete  Culture, blood (routine x 2)     Status: None (Preliminary result)   Collection Time: 10/24/15  2:05 PM  Result Value Ref Range Status   Specimen Description BLOOD RIGHT ARM  Final   Special Requests BOTTLES DRAWN AEROBIC AND ANAEROBIC 10CC   Final   Culture NO GROWTH 2 DAYS  Final   Report Status PENDING  Incomplete  Blood Culture ID Panel (Reflexed)     Status: Abnormal   Collection Time: 10/24/15  2:05 PM  Result Value Ref Range Status   Enterococcus species NOT DETECTED NOT DETECTED Final   Vancomycin resistance NOT DETECTED NOT DETECTED Final   Listeria monocytogenes NOT DETECTED NOT DETECTED Final   Staphylococcus species DETECTED (A) NOT DETECTED Final    Comment: CRITICAL RESULT CALLED TO, READ BACK BY AND VERIFIED WITH: DR Drue Second 10/24/15 @ 1607 M VESTAL    Staphylococcus aureus DETECTED (A) NOT DETECTED Final    Comment: CRITICAL RESULT CALLED TO, READ BACK BY AND VERIFIED WITH: DR Drue Second 10/24/15 @ 1607 M VESTAL    Methicillin resistance NOT DETECTED NOT DETECTED Final   Streptococcus species NOT DETECTED NOT DETECTED Final   Streptococcus agalactiae NOT DETECTED NOT DETECTED Final   Streptococcus pneumoniae NOT DETECTED NOT DETECTED Final   Streptococcus pyogenes NOT DETECTED NOT DETECTED Final   Acinetobacter baumannii NOT DETECTED NOT DETECTED Final   Enterobacteriaceae species NOT DETECTED NOT  DETECTED Final   Enterobacter cloacae complex NOT DETECTED NOT DETECTED Final   Escherichia coli NOT DETECTED NOT DETECTED Final   Klebsiella oxytoca NOT DETECTED NOT DETECTED Final   Klebsiella pneumoniae NOT DETECTED NOT DETECTED Final   Proteus species NOT DETECTED NOT DETECTED Final   Serratia marcescens NOT DETECTED NOT DETECTED Final   Carbapenem resistance NOT DETECTED NOT DETECTED Final   Haemophilus influenzae NOT DETECTED NOT DETECTED Final   Neisseria meningitidis NOT DETECTED NOT DETECTED Final   Pseudomonas aeruginosa NOT DETECTED NOT DETECTED Final   Candida albicans NOT DETECTED NOT DETECTED Final   Candida glabrata NOT DETECTED NOT DETECTED Final   Candida krusei NOT DETECTED NOT DETECTED Final   Candida parapsilosis NOT DETECTED NOT DETECTED Final   Candida tropicalis NOT DETECTED NOT DETECTED Final    Impression/Plan:  1. Disseminated MRSA - BCID with MSSA but cultures MRSA.  Septic emboli in lungs, repeat blood cultures sent.   2.  Osteomyelitis - associated with soft tissue changes, fluid collection.   ? If needs debridement as well.

## 2015-10-27 NOTE — Progress Notes (Signed)
Orthopedic Tech Progress Note Patient Details:  Kevin Austin 27-Jun-1990 [id number]  Ortho Devices Type of Ortho Device: Postop shoe/boot Ortho Device/Splint Location: Prafo Ortho Device/Splint Interventions: Application   Saul FordyceJennifer C Klever [place] 10/27/2015, 3:23 PM

## 2015-10-27 NOTE — Transfer of Care (Signed)
Immediate Anesthesia Transfer of Care Note  Patient: Kevin Austin  Procedure(s) Performed: Procedure(s): IRRIGATION AND DEBRIDEMENT EXTREMITY (Right)  Patient Location: ICU  Anesthesia Type:General  Level of Consciousness: sedated, unresponsive and Patient remains intubated per anesthesia plan  Airway & Oxygen Therapy: Patient remains intubated per anesthesia plan and Patient placed on Ventilator (see vital sign flow sheet for setting)  Post-op Assessment: Report given to RN and Post -op Vital signs reviewed and stable  Post vital signs: Reviewed and stable  Last Vitals:  Filed Vitals:   10/27/15 1300 10/27/15 1400  BP: 123/64 124/68  Pulse: 132 130  Temp: 37.6 C 37.4 C  Resp: 17 22    Last Pain:  Filed Vitals:   10/27/15 1447  PainSc: Asleep         Complications: No apparent anesthesia complications

## 2015-10-27 NOTE — Progress Notes (Signed)
eLink Physician-Brief Progress Note Patient Name: Kevin Austin DOB: 1990/10/21 MRN: [id number]   Date of Service  10/27/2015  HPI/Events of Note  RN calls as pt had a 40 beat VTach after returning from OR for I and D of R ankle.   Pt came out of VT to NSR.   eICU Interventions  Will order lytes.  Pt is now sinus tachy in 120s, BP 125/66.         Jose Bridgette Habermannngelo A De Ben ArnoldDios 10/27/2015, 6:25 PM

## 2015-10-27 NOTE — Brief Op Note (Signed)
10/22/2015 - 10/27/2015  3:21 PM  PATIENT:  Jolene Schimkerey Borntreger  25 y.o. male  PRE-OPERATIVE DIAGNOSIS:  infected right ankle  POST-OPERATIVE DIAGNOSIS:  infected right ankle  PROCEDURE:  Procedure(s): IRRIGATION AND DEBRIDEMENT EXTREMITY (Right)  SURGEON:  Surgeon(s) and Role:    * Kathryne Hitchhristopher Y Blackman, MD - Primary  PHYSICIAN ASSISTANT: Montez MoritaKeith Paul, PA-C  ANESTHESIA:   general  EBL:  Total I/O In: 1149 [I.V.:519; NG/GT:630] Out: 1185 [Urine:1175; Blood:10]  COUNTS:  YES  TOURNIQUET:   Total Tourniquet Time Documented: Thigh (Right) - 18 minutes Total: Thigh (Right) - 18 minutes   DICTATION: .Other Dictation: Dictation Number [id number]  PLAN OF CARE: Admit to inpatient   PATIENT DISPOSITION:  ICU - intubated and hemodynamically stable.   Delay start of Pharmacological VTE agent (>24hrs) due to surgical blood loss or risk of bleeding: not applicable

## 2015-10-27 NOTE — Progress Notes (Signed)
Physical Therapy Wound Treatment Patient Details  Name: Kevin Austin MRN: [id number] Date of Birth: 06-13-1990  Today's Date: 10/27/2015 Time: 5916-3846 Time Calculation (min): 55 min  Subjective  Subjective: Pt sedated during hydro session Patient and Family Stated Goals: None stated Date of Onset:  (Unknown) Prior Treatments: I&D 6/27  Pain Score: Pt sedated during treatment  Wound Assessment  Wound / Incision (Open or Dehisced) 10/25/15 Incision - Open Hand Right Dorsal aspect - Ulnar side (Active)  Dressing Type Compression wrap;Gauze (Comment);Moist to dry 10/27/2015 10:00 AM  Dressing Changed Changed 10/27/2015 10:00 AM  Dressing Status Clean;Dry;Intact 10/27/2015 10:00 AM  Dressing Change Frequency Daily 10/27/2015 10:00 AM  Site / Wound Assessment Red 10/27/2015 10:00 AM  % Wound base Red or Granulating 100% 10/27/2015 10:00 AM  % Wound base Yellow 0% 10/27/2015 10:00 AM  % Wound base Black 0% 10/27/2015 10:00 AM  % Wound base Other (Comment) 0% 10/27/2015 10:00 AM  Peri-wound Assessment Intact;Erythema (blanchable) 10/27/2015 10:00 AM  Wound Length (cm) 6.5 cm 10/25/2015  9:37 AM  Wound Width (cm) 1.5 cm 10/25/2015  9:37 AM  Wound Depth (cm) 1.2 cm 10/25/2015  9:37 AM  Undermining (cm) 3-1 o'clock 2.9 cm, 7.-10 o'clock 4.2 cm (connecting), 11-12 o'clock 3.5 cm 10/25/2015  9:37 AM  Margins Unattached edges (unapproximated) 10/27/2015 10:00 AM  Closure None 10/27/2015 10:00 AM  Drainage Amount None 10/27/2015 10:00 AM  Drainage Description Serosanguineous 10/27/2015 10:00 AM  Non-staged Wound Description Not applicable 09/30/9933 70:17 AM  Treatment Hydrotherapy (Pulse lavage);Packing (Impregnated strip) 10/27/2015 10:00 AM     Wound / Incision (Open or Dehisced) 10/25/15 Incision - Open Hand Right Dorsal aspect - Radial side (Active)  Dressing Type Compression wrap;Gauze (Comment);Moist to dry 10/27/2015 10:00 AM  Dressing Changed Changed 10/27/2015 10:00 AM  Dressing Status Clean;Dry;Intact 10/27/2015 10:00 AM   Dressing Change Frequency Daily 10/27/2015 10:00 AM  Site / Wound Assessment Red 10/27/2015 10:00 AM  % Wound base Red or Granulating 100% 10/27/2015 10:00 AM  % Wound base Yellow 0% 10/27/2015 10:00 AM  % Wound base Black 0% 10/27/2015 10:00 AM  % Wound base Other (Comment) 0% 10/27/2015 10:00 AM  Peri-wound Assessment Intact;Erythema (blanchable) 10/27/2015 10:00 AM  Wound Length (cm) 5 cm 10/25/2015  9:37 AM  Wound Width (cm) 1 cm 10/25/2015  9:37 AM  Wound Depth (cm) 1.2 cm 10/25/2015  9:37 AM  Undermining (cm) 1-5 o'clock 4.5 cm (connecting), 9 o'clock 1.6 cm  10/25/2015  9:37 AM  Margins Unattached edges (unapproximated) 10/27/2015 10:00 AM  Closure None 10/27/2015 10:00 AM  Drainage Amount None 10/27/2015 10:00 AM  Drainage Description Serosanguineous 10/27/2015 10:00 AM  Non-staged Wound Description Not applicable 11/03/3901 00:92 AM  Treatment Hydrotherapy (Pulse lavage);Packing (Impregnated strip) 10/27/2015 10:00 AM     Wound / Incision (Open or Dehisced) 10/25/15 Incision - Open Hand Left Dorsal aspect (Active)  Dressing Type Compression wrap;Gauze (Comment);Moist to dry 10/27/2015 10:00 AM  Dressing Changed Changed 10/27/2015 10:00 AM  Dressing Status Clean;Dry;Intact 10/27/2015 10:00 AM  Dressing Change Frequency Daily 10/27/2015 10:00 AM  Site / Wound Assessment Red 10/27/2015 10:00 AM  % Wound base Red or Granulating 100% 10/27/2015 10:00 AM  % Wound base Yellow 0% 10/27/2015 10:00 AM  % Wound base Black 0% 10/27/2015 10:00 AM  % Wound base Other (Comment) 0% 10/27/2015 10:00 AM  Peri-wound Assessment Erythema (blanchable);Intact 10/27/2015 10:00 AM  Wound Length (cm) 4 cm 10/25/2015  9:37 AM  Wound Width (cm) 0.8 cm 10/25/2015  9:37  AM  Wound Depth (cm) 1.5 cm 10/25/2015  9:37 AM  Undermining (cm) 2-4 o'clock 2.7 cm, 10 o'clock 2.1 cm 10/25/2015  9:37 AM  Margins Unattached edges (unapproximated) 10/27/2015 10:00 AM  Closure None 10/27/2015 10:00 AM  Drainage Amount None 10/27/2015 10:00 AM  Drainage Description  Serosanguineous 10/27/2015 10:00 AM  Non-staged Wound Description Not applicable 07/30/8375 93:96 AM  Treatment Hydrotherapy (Pulse lavage);Packing (Impregnated strip) 10/27/2015 10:00 AM     Wound / Incision (Open or Dehisced) 10/25/15 Incision - Open Hand Left Palmar aspect (Active)  Dressing Type Compression wrap;Gauze (Comment);Moist to dry 10/27/2015 10:00 AM  Dressing Changed Changed 10/27/2015 10:00 AM  Dressing Status Clean;Dry;Intact 10/27/2015 10:00 AM  Dressing Change Frequency Daily 10/27/2015 10:00 AM  Site / Wound Assessment Red 10/27/2015 10:00 AM  % Wound base Red or Granulating 100% 10/27/2015 10:00 AM  % Wound base Yellow 0% 10/27/2015 10:00 AM  % Wound base Black 0% 10/27/2015 10:00 AM  % Wound base Other (Comment) 0% 10/27/2015 10:00 AM  Peri-wound Assessment Intact;Maceration 10/27/2015 10:00 AM  Wound Length (cm) 0.3 cm 10/25/2015  9:37 AM  Wound Width (cm) 4.1 cm 10/25/2015  9:37 AM  Wound Depth (cm) 1.8 cm 10/25/2015  9:37 AM  Undermining (cm) 7 o'clock 3.0 cm, 9 o'clock 2.2 cm, 1 o'clock .7 cm, 3-6 o'clock 1.2 cm 10/25/2015  9:37 AM  Margins Unattached edges (unapproximated) 10/27/2015 10:00 AM  Closure None 10/27/2015 10:00 AM  Drainage Amount None 10/27/2015 10:00 AM  Drainage Description Serosanguineous 10/27/2015 10:00 AM  Non-staged Wound Description Not applicable 12/04/6482 72:07 AM  Treatment Hydrotherapy (Pulse lavage);Packing (Impregnated strip) 10/27/2015 10:00 AM   Hydrotherapy Pulsed lavage therapy - wound location: Bilateral hands Pulsed Lavage with Suction (psi): 4 psi Pulsed Lavage with Suction - Normal Saline Used: 500 mL (Between bilaeral hands) Pulsed Lavage Tip: Tunneling tip   Wound Assessment and Plan  Wound Therapy - Assess/Plan/Recommendations Wound Therapy - Clinical Statement: Wound beds remain clean. Wound Therapy - Functional Problem List: Decreased strength/AROM of hands/fingers for ADL's and fine motor tasks.  Factors Delaying/Impairing Wound Healing: Substance  abuse;Multiple medical problems Hydrotherapy Plan: Patient/family education;Pulsatile lavage with suction;Dressing change Wound Therapy - Frequency: 6X / week Wound Therapy - Follow Up Recommendations: Other (comment) (Per hand surgeon) Wound Plan: See above  Wound Therapy Goals- Improve the function of patient's integumentary system by progressing the wound(s) through the phases of wound healing (inflammation - proliferation - remodeling) by: Patient/Family will be able to : complete dressing changes independently prior to d/c Patient/Family Instruction Goal - Progress: Not progressing Goals/treatment plan/discharge plan were made with and agreed upon by patient/family: No, Patient unable to participate in goals/treatment/discharge plan and family unavailable Time For Goal Achievement: 7 days Wound Therapy - Potential for Goals: Excellent  Goals will be updated until maximal potential achieved or discharge criteria met.  Discharge criteria: when goals achieved, discharge from hospital, MD decision/surgical intervention, no progress towards goals, refusal/missing three consecutive treatments without notification or medical reason.  GP     Rolinda Roan 10/27/2015, 10:40 AM   Rolinda Roan, PT, DPT Acute Rehabilitation Services Pager: [phone]

## 2015-10-27 NOTE — Op Note (Signed)
NAMMarygrace Austin:  [place], [place]                   ACCOUNT NO.:  [id number]  MEDICAL RECORD NO.:  [id number]  LOCATION:  2M13C                        FACILITY:  [facility]  PHYSICIAN:  Vanita PandaChristopher Y. Magnus IvanBlackman, M.D.DATE OF BIRTH:  08-29-1990  DATE OF PROCEDURE:  10/27/2015 DATE OF DISCHARGE:                              OPERATIVE REPORT   PREOPERATIVE DIAGNOSES: 1. Right septic ankle joint. 2. Soft tissue abscess globally, right ankle.  POSTOPERATIVE DIAGNOSES: 1. Right septic ankle joint. 2. Soft tissue abscess globally, right ankle.  PROCEDURES: 1. Open irrigation and debridement of right ankle tibiotalar joint. 2. Irrigation and debridement of soft tissue, globally surrounding the     ankle including irrigation of the deep posterior compartment.  FINDINGS: 1. Gross purulence, right ankle joint. 2. Gross purulence, superficial tissue around the lateral and medial     as well as posterior aspect of the right ankle and deep abscess to     the posterior compartment.  SURGEON:  Kathryne Hitchhristopher Y Salvatrice Morandi, MD.  ASSISTANT:  Montez MoritaKeith Paul, PA-C.  ANESTHESIA:  General.  BLOOD LOSS:  Less than 100 mL.  TOURNIQUET TIME:  Less than 20 minutes.  COMPLICATIONS:  None.  INDICATIONS:  Kevin Austin is a 25 year old critically ill individual on the ventilator in the intensive care unit.  He has been there recovering from a septic emboli from likely IV drug abuse.  Earlier on his admission he had irrigation and debridement by other physicians of his left hand and his left foot.  On examination today, he had global swelling around his right ankle and obviously withdrawing to pain to that ankle.  We did clean the ankle with Betadine and alcohol.  Used an 18-gauge needle and aspirated gross purulence from the ankle joint.  I talked to his mother on the phone.  I talked to critical care specialist, Dr. Marchelle Gearingamaswamy, and we recommended an urgent irrigation and debridement of his ankle joint and the soft tissues around  the ankle given his spike in temperatures and continued high white blood count. Informed consent was obtained over the phone via his mother.  PROCEDURE DESCRIPTION:  After informed consent was obtained, appropriate right ankle was marked.  He was brought directly from the ICU on the ventilator to the operating room.  He was then placed supine on the operating table.  General anesthesia was obtained.  His right leg was prepped and draped with Betadine scrub and paint as prescrubbed.  We did place a nonsterile tourniquet around his upper right leg.  Time-out was called and he was identified as the correct patient, correct right ankle.  We then had the leg elevated and tourniquet was inflated to 300 mm of pressure.  First I made an anteromedial incision and dissected down the soft tissue right to the ankle joint.  When I opened up the tibiotalar joint, gross purulence did come out of the ankle joint.  We irrigated this area first.  We then went to the lateral aspect of ankle and made an anterolateral excision.  We dissected down through the soft tissue and did find gross purulence in the soft tissues as well as opened up the ankle joint for exit portal  for an irrigating out thoroughly ankle joint.  We then finally made a posteromedial incision and dissected down to the soft tissues.  I went to the posterior compartment and found gross purulence in this area as well.  We then used cysto tubing to irrigate the ankle joint and soft tissues as well as pulsatile lavage to irrigate the soft tissues, running at least 6-9 L of fluid around the soft tissue and through the ankle joint.  We did not perform any excisional debridement and this was all an incisional irrigation of the surrounding soft tissues and ankle joint itself.  Once we felt like we had adequately decompressed any abscess or infected tissues, we just loosely approximated the incisions with interrupted nylon suture.  Xeroform and  well-padded sterile dressing were applied wrapped around his ankle.  We did let the tourniquet down prior to closing and hemostasis was easily obtained with electrocautery.  He was then taken back on the ventilator to the ICU to continue his course of critical care treatment and IV antibiotics.  Of note, Montez MoritaKeith Paul PA-C assisted in the entire case and his assistance was crucial for helping to facilitate this case.     Vanita Pandahristopher Y. Magnus IvanBlackman, M.D.     CYB/MEDQ  D:  10/27/2015  T:  10/27/2015  Job:  [id number]

## 2015-10-27 NOTE — Progress Notes (Signed)
Pharmacy Antibiotic Note  Jolene Schimkerey Clontz is a 25 y.o. male admitted on 10/22/2015 with pneumonia and bacteremia.  Pharmacy has been consulted for vancomycin dosing.  Tr = 20 on 1500 mg q8h. Renal fx stable.   Plan: Continue vancomycin 1500 mg q8h Trough at ss 1400 on 7/2 (to confirm) Will continue same dose based on ID requesting targeting high troughs of 20 (likely will need to back off dosing tomorrow as patient may accumulate) Will closely watch renal fx and VT on this high dose (pt will need tr before every 4th dose until dosing can be confirmed/stable)   Height: 5\' 11"  (180.3 cm) Weight: 208 lb 8.9 oz (94.6 kg) IBW/kg (Calculated) : 75.3  Temp (24hrs), Avg:99.1 F (37.3 C), Min:97.5 F (36.4 C), Max:100.7 F (38.2 C)   Recent Labs Lab 10/22/15 1844 10/23/15 0914 10/24/15 0926  10/25/15 0440 10/25/15 0648 10/26/15 0430 10/27/15 0540 10/27/15 1601  WBC  --  18.5* 19.1*  --   --  17.2* 14.5* 13.3*  --   CREATININE 0.85  --  0.53*  --  0.41*  --  0.49* 0.49*  0.49*  --   LATICACIDVEN 1.8  --   --   --   --   --   --   --   --   VANCOTROUGH  --   --   --   < >  --   --  14*  --  20  < > = values in this interval not displayed.  Estimated Creatinine Clearance: 168.6 mL/min (by C-G formula based on Cr of 0.49).    Allergies  Allergen Reactions  . [facility]ac Nausea [facility]    [facility]  . Tramadol Nausea Only    Per [facility]    Antimicrobials this admission: Vanc 6/26 >  Zosyn 6/26 >>6/27 Cefepime 6/26 >6/26 Ceftaz 6/27>>6/28  Dose adjustments this admission: 6/28 VT = on 1g q8h 6/30 VT = 14 on 1250 q8 7/1 VT = 20 on 1500 q8  Microbiology results: 6/26 BCx2: staph aureus 6/26 BCID: MRSA 6/26 UCx: sent 6/26 Sputum: staph aureus 6/26 MRSA PCR: pos  Isaac BlissMichael Zailee Vallely, PharmD, BCPS, [facility] Clinical Pharmacist Pager [phone] 10/27/2015 4:49 PM
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
## 2015-10-27 NOTE — Anesthesia Preprocedure Evaluation (Signed)
Anesthesia Evaluation  Patient identified by MRN, date of birth, ID band Patient unresponsive    Reviewed: Allergy & Precautions, NPO status , Patient's Chart, lab work & pertinent test results  Airway Mallampati: Intubated  TM Distance: >3 FB Neck ROM: Full    Dental no notable dental hx.    Pulmonary neg pulmonary ROS,    Pulmonary exam normal breath sounds clear to auscultation       Cardiovascular negative cardio ROS Normal cardiovascular exam Rhythm:Regular Rate:Normal     Neuro/Psych negative neurological ROS  negative psych ROS   GI/Hepatic negative GI ROS, Neg liver ROS,   Endo/Other  negative endocrine ROS  Renal/GU negative Renal ROS  negative genitourinary   Musculoskeletal negative musculoskeletal ROS (+)   Abdominal   Peds negative pediatric ROS (+)  Hematology negative hematology ROS (+)   Anesthesia Other Findings   Reproductive/Obstetrics negative OB ROS                             Anesthesia Physical Anesthesia Plan  ASA: III  Anesthesia Plan: General   Post-op Pain Management:    Induction: Inhalational  Airway Management Planned: Oral ETT  Additional Equipment:   Intra-op Plan:   Post-operative Plan: Post-operative intubation/ventilation  Informed Consent: I have reviewed the patients History and Physical, chart, labs and discussed the procedure including the risks, benefits and alternatives for the proposed anesthesia with the patient or authorized representative who has indicated his/her understanding and acceptance.   Dental advisory given  Plan Discussed with: CRNA and Surgeon  Anesthesia Plan Comments:         Anesthesia Quick Evaluation
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
## 2015-10-28 ENCOUNTER — Inpatient Hospital Stay (HOSPITAL_COMMUNITY): Payer: Medicaid Other

## 2015-10-28 DIAGNOSIS — I2690 Septic pulmonary embolism without acute cor pulmonale: Secondary | ICD-10-CM

## 2015-10-28 DIAGNOSIS — M00072 Staphylococcal arthritis, left ankle and foot: Secondary | ICD-10-CM

## 2015-10-28 DIAGNOSIS — L02511 Cutaneous abscess of right hand: Secondary | ICD-10-CM

## 2015-10-28 DIAGNOSIS — B9562 Methicillin resistant Staphylococcus aureus infection as the cause of diseases classified elsewhere: Secondary | ICD-10-CM

## 2015-10-28 LAB — AEROBIC/ANAEROBIC CULTURE (SURGICAL/DEEP WOUND)

## 2015-10-28 LAB — TRIGLYCERIDES: Triglycerides: 397 mg/dL — ABNORMAL HIGH (ref <150)

## 2015-10-28 LAB — RENAL FUNCTION PANEL
ALBUMIN: 1 g/dL — AB (ref 3.5–5.0)
ANION GAP: 8 (ref 5–15)
BUN: 15 mg/dL (ref 6–20)
CO2: 32 mmol/L (ref 22–32)
Calcium: 8.1 mg/dL — ABNORMAL LOW (ref 8.9–10.3)
Chloride: 100 mmol/L — ABNORMAL LOW (ref 101–111)
Creatinine, Ser: 0.65 mg/dL (ref 0.61–1.24)
GFR calc non Af Amer: >60 mL/min (ref >60)
GLUCOSE: 114 mg/dL — AB (ref 65–99)
PHOSPHORUS: 4.2 mg/dL (ref 2.5–4.6)
POTASSIUM: 4.2 mmol/L (ref 3.5–5.1)
Sodium: 140 mmol/L (ref 135–145)

## 2015-10-28 LAB — CBC WITH DIFFERENTIAL/PLATELET
Basophils Absolute: 0 10*3/uL (ref 0.0–0.1)
Basophils Relative: 0 %
EOS ABS: 0.1 10*3/uL (ref 0.0–0.7)
Eosinophils Relative: 1 %
HCT: 26.5 % — ABNORMAL LOW (ref 39.0–52.0)
Hemoglobin: 8.1 g/dL — ABNORMAL LOW (ref 13.0–17.0)
Lymphocytes Relative: 9 %
Lymphs Abs: 0.9 10*3/uL (ref 0.7–4.0)
MCH: 27 pg (ref 26.0–34.0)
MCHC: 30.6 g/dL (ref 30.0–36.0)
MCV: 88.3 fL (ref 78.0–100.0)
MONO ABS: 0.9 10*3/uL (ref 0.1–1.0)
Monocytes Relative: 9 %
NEUTROS PCT: 81 %
Neutro Abs: 8.3 10*3/uL — ABNORMAL HIGH (ref 1.7–7.7)
PLATELETS: 183 10*3/uL (ref 150–400)
RBC: 3 MIL/uL — AB (ref 4.22–5.81)
RDW: 16.5 % — ABNORMAL HIGH (ref 11.5–15.5)
WBC MORPHOLOGY: INCREASED
WBC: 10.2 10*3/uL (ref 4.0–10.5)

## 2015-10-28 LAB — AEROBIC/ANAEROBIC CULTURE W GRAM STAIN (SURGICAL/DEEP WOUND)

## 2015-10-28 LAB — MAGNESIUM: Magnesium: 2 mg/dL (ref 1.7–2.4)

## 2015-10-28 LAB — GLUCOSE, CAPILLARY
GLUCOSE-CAPILLARY: 100 mg/dL — AB (ref 65–99)
GLUCOSE-CAPILLARY: 106 mg/dL — AB (ref 65–99)
Glucose-Capillary: 111 mg/dL — ABNORMAL HIGH (ref 65–99)
Glucose-Capillary: 117 mg/dL — ABNORMAL HIGH (ref 65–99)
Glucose-Capillary: 96 mg/dL (ref 65–99)
Glucose-Capillary: 112 mg/dL — ABNORMAL HIGH (ref 65–99)

## 2015-10-28 LAB — VANCOMYCIN, TROUGH: VANCOMYCIN TR: 17 ug/mL (ref 15–20)

## 2015-10-28 MED ORDER — CETYLPYRIDINIUM CHLORIDE 0.05 % MT LIQD
7.0000 mL | Freq: Two times a day (BID) | OROMUCOSAL | Status: DC
  Administered 2015-10-28 – 2015-10-29 (×2): 7 mL via OROMUCOSAL

## 2015-10-28 MED ORDER — MIDAZOLAM HCL 2 MG/2ML IJ SOLN
1.0000 mg | INTRAMUSCULAR | Status: DC | PRN
  Administered 2015-10-28: 4 mg via INTRAVENOUS
  Administered 2015-10-28: 3 mg via INTRAVENOUS
  Administered 2015-10-28 – 2015-11-01 (×2): 2 mg via INTRAVENOUS
  Administered 2015-11-01: 4 mg via INTRAVENOUS
  Administered 2015-11-01 – 2015-11-02 (×3): 2 mg via INTRAVENOUS
  Administered 2015-11-02: 4 mg via INTRAVENOUS
  Administered 2015-11-02 (×4): 2 mg via INTRAVENOUS
  Administered 2015-11-02 – 2015-11-03 (×2): 4 mg via INTRAVENOUS
  Administered 2015-11-03 (×2): 2 mg via INTRAVENOUS
  Administered 2015-11-03 (×2): 4 mg via INTRAVENOUS
  Filled 2015-10-28 (×2): qty 4
  Filled 2015-10-28: qty 2
  Filled 2015-10-28: qty 4
  Filled 2015-10-28 (×3): qty 2
  Filled 2015-10-28 (×3): qty 4
  Filled 2015-10-28: qty 2
  Filled 2015-10-28 (×2): qty 4
  Filled 2015-10-28 (×4): qty 2
  Filled 2015-10-28: qty 4

## 2015-10-28 MED ORDER — FAMOTIDINE IN NACL 20-0.9 MG/50ML-% IV SOLN
20.0000 mg | Freq: Two times a day (BID) | INTRAVENOUS | Status: DC
  Administered 2015-10-28 – 2015-11-07 (×21): 20 mg via INTRAVENOUS
  Filled 2015-10-28 (×22): qty 50

## 2015-10-28 MED ORDER — DEXMEDETOMIDINE HCL IN NACL 400 MCG/100ML IV SOLN
0.4000 ug/kg/h | INTRAVENOUS | Status: DC
  Administered 2015-10-28: 1.1 ug/kg/h via INTRAVENOUS
  Administered 2015-10-29: 0.6 ug/kg/h via INTRAVENOUS
  Administered 2015-10-29: 1.1 ug/kg/h via INTRAVENOUS
  Filled 2015-10-28 (×3): qty 100

## 2015-10-28 MED ORDER — FENTANYL CITRATE (PF) 100 MCG/2ML IJ SOLN
50.0000 ug | INTRAMUSCULAR | Status: DC | PRN
  Administered 2015-10-28 (×3): 200 ug via INTRAVENOUS
  Administered 2015-10-28: 100 ug via INTRAVENOUS
  Administered 2015-10-28 – 2015-10-29 (×3): 200 ug via INTRAVENOUS
  Administered 2015-11-04: 150 ug via INTRAVENOUS
  Administered 2015-11-04: 100 ug via INTRAVENOUS
  Administered 2015-11-04 (×3): 200 ug via INTRAVENOUS
  Administered 2015-11-05 (×2): 100 ug via INTRAVENOUS
  Administered 2015-11-05: 200 ug via INTRAVENOUS
  Administered 2015-11-05: 100 ug via INTRAVENOUS
  Filled 2015-10-28 (×4): qty 4
  Filled 2015-10-28: qty 2
  Filled 2015-10-28 (×2): qty 4

## 2015-10-28 MED ORDER — HALOPERIDOL LACTATE 5 MG/ML IJ SOLN
5.0000 mg | INTRAMUSCULAR | Status: AC
  Administered 2015-10-28: 5 mg via INTRAVENOUS

## 2015-10-28 MED ORDER — HALOPERIDOL LACTATE 5 MG/ML IJ SOLN
INTRAMUSCULAR | Status: AC
  Administered 2015-10-28: 5 mg via INTRAVENOUS
  Filled 2015-10-28: qty 1

## 2015-10-28 MED ORDER — HALOPERIDOL LACTATE 5 MG/ML IJ SOLN
5.0000 mg | Freq: Four times a day (QID) | INTRAMUSCULAR | Status: DC | PRN
  Administered 2015-11-02 – 2015-11-07 (×3): 5 mg via INTRAVENOUS
  Filled 2015-10-28 (×3): qty 1

## 2015-10-28 MED ORDER — DEXMEDETOMIDINE HCL IN NACL 200 MCG/50ML IV SOLN
0.4000 ug/kg/h | INTRAVENOUS | Status: DC
  Administered 2015-10-28 (×2): 1.2 ug/kg/h via INTRAVENOUS
  Administered 2015-10-28: 1 ug/kg/h via INTRAVENOUS
  Administered 2015-10-28: 0.4 ug/kg/h via INTRAVENOUS
  Administered 2015-10-28: 1 ug/kg/h via INTRAVENOUS
  Filled 2015-10-28 (×8): qty 50

## 2015-10-28 NOTE — Progress Notes (Signed)
Pt alert to voice, following commands. Cough is strong with dark red thick sputum coming up. SpO2 100% NRB. [place] at 4L initiated.Will continue to monitor and assess pt closely.

## 2015-10-28 NOTE — Progress Notes (Signed)
Call from RN  More agitated  -plan  - haldol stat and prn - vedrsed stat and prn  - restraints for 6h   - monitor  Dr. Maisha Bogen, M.D., [facility] L[facility].C.P Pulmonary and Critical Care Medicine Staff Physician [facility] [place] [facility] Pager: [phone], If no answer or between  15:00h - 7:00h: call [phone]  10/28/2015 2:40 PM

## 2015-10-28 NOTE — Progress Notes (Addendum)
[facility]   Reason for visit: Follow up on disseminated MRSA.  Interval History: self extubated, moaning; fever with cooling blanket.  Vancomycin trough 20.  OR yesterday for ankle infection, noted gross purulence.    Physical Exam: Constitutional:  Filed Vitals:   10/28/15 1230 10/28/15 1300  BP: 141/91 146/98  Pulse: 111 111  Temp: 98.1 F (36.7 C) 98.1 F (36.7 C)  Resp: 22 21  NRB, agitated Respiratory: CTA B Cardiovascular: Tachy RR GI: soft, nt  Review of Systems: Unable to be assessed due to mental status  Lab Results  Component Value Date   WBC 10.2 10/28/2015   HGB 8.1* 10/28/2015   HCT 26.5* 10/28/2015   MCV 88.3 10/28/2015   PLT 183 10/28/2015    Lab Results  Component Value Date   CREATININE 0.65 10/28/2015   BUN 15 10/28/2015   NA 140 10/28/2015   K 4.2 10/28/2015   CL 100* 10/28/2015   CO2 32 10/28/2015    Lab Results  Component Value Date   ALT 20 10/26/2015   AST 46* 10/26/2015   ALKPHOS 282* 10/26/2015     Microbiology: Recent Results (from the past 240 hour(s))  MRSA PCR Screening     Status: Abnormal   Collection Time: 10/22/15  5:41 PM  Result Value Ref Range Status   MRSA by PCR POSITIVE (A) NEGATIVE Final    Comment:        The GeneXpert MRSA Assay (FDA approved for NASAL specimens only), is one component of a comprehensive MRSA colonization surveillance program. It is not intended to diagnose MRSA infection nor to guide or monitor treatment for MRSA infections. RESULT CALLED TO, READ BACK BY AND VERIFIED WITH: A THOMPSON,RN @0110  10/23/15 MKELLY   Urine culture     Status: Abnormal   Collection Time: 10/22/15  6:38 PM  Result Value Ref Range Status   Specimen Description URINE, CATHETERIZED  Final   Special Requests NONE  Final   Culture 2,000 COLONIES/mL INSIGNIFICANT GROWTH (A)  Final   Report Status 10/24/2015 FINAL  Final  Culture, blood (Routine X 2) w Reflex to ID Panel     Status:  Abnormal   Collection Time: 10/22/15  6:44 PM  Result Value Ref Range Status   Specimen Description BLOOD LEFT HAND  Final   Special Requests BOTTLES DRAWN AEROBIC AND ANAEROBIC 5 CC   Final   Culture  Setup Time   Final    GRAM POSITIVE COCCI IN CLUSTERS IN BOTH AEROBIC AND ANAEROBIC BOTTLES CRITICAL RESULT CALLED TO, READ BACK BY AND VERIFIED WITH: C STEWART,PHARMD AT 1040 10/23/15 BY L BENFIELD    Culture (A)  Final    STAPHYLOCOCCUS AUREUS SUSCEPTIBILITIES PERFORMED ON PREVIOUS CULTURE WITHIN THE LAST 5 DAYS.    Report Status 10/25/2015 FINAL  Final  Culture, blood (Routine X 2) w Reflex to ID Panel     Status: Abnormal   Collection Time: 10/22/15  7:02 PM  Result Value Ref Range Status   Specimen Description BLOOD LEFT HAND  Final   Special Requests BOTTLES DRAWN AEROBIC AND ANAEROBIC 5 CC   Final   Culture  Setup Time   Final    GRAM POSITIVE COCCI IN CLUSTERS IN BOTH AEROBIC AND ANAEROBIC BOTTLES CRITICAL RESULT CALLED TO, READ BACK BY AND VERIFIED WITH: C STEWART,PHARMD AT 1040 10/23/15 BY L BENFIELD    Culture METHICILLIN RESISTANT STAPHYLOCOCCUS AUREUS (A)  Final   Report Status 10/25/2015 FINAL  Final   Organism ID, Bacteria METHICILLIN RESISTANT STAPHYLOCOCCUS AUREUS  Final      Susceptibility   Methicillin resistant staphylococcus aureus - MIC*    CIPROFLOXACIN <=0.5 SENSITIVE Sensitive     ERYTHROMYCIN >=8 RESISTANT Resistant     GENTAMICIN <=0.5 SENSITIVE Sensitive     OXACILLIN >=4 RESISTANT Resistant     TETRACYCLINE <=1 SENSITIVE Sensitive     VANCOMYCIN <=0.5 SENSITIVE Sensitive     TRIMETH/SULFA <=10 SENSITIVE Sensitive     CLINDAMYCIN <=0.25 SENSITIVE Sensitive     RIFAMPIN <=0.5 SENSITIVE Sensitive     Inducible Clindamycin NEGATIVE Sensitive     * METHICILLIN RESISTANT STAPHYLOCOCCUS AUREUS  Blood Culture ID Panel (Reflexed)     Status: Abnormal   Collection Time: 10/22/15  7:02 PM  Result Value Ref Range Status   Enterococcus species NOT DETECTED NOT  DETECTED Final   Vancomycin resistance NOT DETECTED NOT DETECTED Final   Listeria monocytogenes NOT DETECTED NOT DETECTED Final   Staphylococcus species DETECTED (A) NOT DETECTED Final    Comment: CRITICAL RESULT CALLED TO, READ BACK BY AND VERIFIED WITH: C STEWART,PHARMD AT 1040 10/23/15 BY L BENFIELD    Staphylococcus aureus DETECTED (A) NOT DETECTED Final    Comment: CRITICAL RESULT CALLED TO, READ BACK BY AND VERIFIED WITH: C STEWART,PHARMD AT 1040 10/23/15 BY L BENFIELD    Methicillin resistance DETECTED (A) NOT DETECTED Final    Comment: CRITICAL RESULT CALLED TO, READ BACK BY AND VERIFIED WITH: C STEWART,PHARMD AT 1040 10/23/15 BY L BENFIELD    Streptococcus species NOT DETECTED NOT DETECTED Final   Streptococcus agalactiae NOT DETECTED NOT DETECTED Final   Streptococcus pneumoniae NOT DETECTED NOT DETECTED Final   Streptococcus pyogenes NOT DETECTED NOT DETECTED Final   Acinetobacter baumannii NOT DETECTED NOT DETECTED Final   Enterobacteriaceae species NOT DETECTED NOT DETECTED Final   Enterobacter cloacae complex NOT DETECTED NOT DETECTED Final   Escherichia coli NOT DETECTED NOT DETECTED Final   Klebsiella oxytoca NOT DETECTED NOT DETECTED Final   Klebsiella pneumoniae NOT DETECTED NOT DETECTED Final   Proteus species NOT DETECTED NOT DETECTED Final   Serratia marcescens NOT DETECTED NOT DETECTED Final   Carbapenem resistance NOT DETECTED NOT DETECTED Final   Haemophilus influenzae NOT DETECTED NOT DETECTED Final   Neisseria meningitidis NOT DETECTED NOT DETECTED Final   Pseudomonas aeruginosa NOT DETECTED NOT DETECTED Final   Candida albicans NOT DETECTED NOT DETECTED Final   Candida glabrata NOT DETECTED NOT DETECTED Final   Candida krusei NOT DETECTED NOT DETECTED Final   Candida parapsilosis NOT DETECTED NOT DETECTED Final   Candida tropicalis NOT DETECTED NOT DETECTED Final  Fungus Culture With Stain (Not @ [facility])     Status: None (Preliminary result)   Collection  Time: 10/23/15  7:09 PM  Result Value Ref Range Status   Fungus Stain Final report  Final    Comment: (NOTE) Performed At: [facility] [address] [place], [place] [id number] Mila Homer MD JX:[id number]    Fungus (Mycology) Culture PENDING  Incomplete   Fungal Source ABSCESS  Final    Comment: RIGHT HAND   Aerobic/Anaerobic Culture (surgical/deep wound)     Status: None   Collection Time: 10/23/15  7:09 PM  Result Value Ref Range Status   Specimen Description ABSCESS HAND RIGHT  Final   Special Requests PT ON VANC FORTAZ ZOSYN  Final   Gram Stain   Final    MODERATE WBC PRESENT,BOTH PMN AND MONONUCLEAR FEW GRAM  POSITIVE COCCI IN PAIRS IN CLUSTERS    Culture   Final    MODERATE METHICILLIN RESISTANT STAPHYLOCOCCUS AUREUS NO ANAEROBES ISOLATED    Report Status 10/28/2015 FINAL  Final   Organism ID, Bacteria METHICILLIN RESISTANT STAPHYLOCOCCUS AUREUS  Final      Susceptibility   Methicillin resistant staphylococcus aureus - MIC*    CIPROFLOXACIN <=0.5 SENSITIVE Sensitive     ERYTHROMYCIN >=8 RESISTANT Resistant     GENTAMICIN <=0.5 SENSITIVE Sensitive     OXACILLIN >=4 RESISTANT Resistant     TETRACYCLINE <=1 SENSITIVE Sensitive     VANCOMYCIN <=0.5 SENSITIVE Sensitive     TRIMETH/SULFA <=10 SENSITIVE Sensitive     CLINDAMYCIN <=0.25 SENSITIVE Sensitive     RIFAMPIN <=0.5 SENSITIVE Sensitive     Inducible Clindamycin NEGATIVE Sensitive     * MODERATE METHICILLIN RESISTANT STAPHYLOCOCCUS AUREUS  Fungus Culture Result     Status: None   Collection Time: 10/23/15  7:09 PM  Result Value Ref Range Status   Result 1 Comment  Final    Comment: (NOTE) KOH/Calcofluor preparation:  no fungus observed. Performed At: [facility] [address] [place], [place] [id number] Mila Homer MD WU:[id number]   Aerobic/Anaerobic Culture (surgical/deep wound)     Status: None   Collection Time: 10/23/15  7:17 PM  Result Value Ref Range Status   Specimen  Description ABSCESS LEFT FOOT  Final   Special Requests PT ON VANC FORTAZ ZOSYN  Final   Gram Stain   Final    MODERATE WBC PRESENT,BOTH PMN AND MONONUCLEAR FEW GRAM POSITIVE COCCI IN CLUSTERS    Culture   Final    FEW METHICILLIN RESISTANT STAPHYLOCOCCUS AUREUS NO ANAEROBES ISOLATED    Report Status 10/28/2015 FINAL  Final   Organism ID, Bacteria METHICILLIN RESISTANT STAPHYLOCOCCUS AUREUS  Final      Susceptibility   Methicillin resistant staphylococcus aureus - MIC*    CIPROFLOXACIN <=0.5 SENSITIVE Sensitive     ERYTHROMYCIN >=8 RESISTANT Resistant     GENTAMICIN <=0.5 SENSITIVE Sensitive     OXACILLIN >=4 RESISTANT Resistant     TETRACYCLINE <=1 SENSITIVE Sensitive     VANCOMYCIN <=0.5 SENSITIVE Sensitive     TRIMETH/SULFA <=10 SENSITIVE Sensitive     CLINDAMYCIN <=0.25 SENSITIVE Sensitive     RIFAMPIN <=0.5 SENSITIVE Sensitive     Inducible Clindamycin NEGATIVE Sensitive     * FEW METHICILLIN RESISTANT STAPHYLOCOCCUS AUREUS  Culture, respiratory (NON-Expectorated)     Status: None   Collection Time: 10/24/15  3:29 AM  Result Value Ref Range Status   Specimen Description TRACHEAL ASPIRATE  Final   Special Requests Normal  Final   Gram Stain   Final    ABUNDANT WBC PRESENT, PREDOMINANTLY PMN RARE SQUAMOUS EPITHELIAL CELLS PRESENT RARE GRAM POSITIVE COCCI IN CLUSTERS    Culture FEW METHICILLIN RESISTANT STAPHYLOCOCCUS AUREUS  Final   Report Status 10/26/2015 FINAL  Final   Organism ID, Bacteria METHICILLIN RESISTANT STAPHYLOCOCCUS AUREUS  Final      Susceptibility   Methicillin resistant staphylococcus aureus - MIC*    CIPROFLOXACIN <=0.5 SENSITIVE Sensitive     ERYTHROMYCIN >=8 RESISTANT Resistant     GENTAMICIN <=0.5 SENSITIVE Sensitive     OXACILLIN >=4 RESISTANT Resistant     TETRACYCLINE <=1 SENSITIVE Sensitive     VANCOMYCIN 1 SENSITIVE Sensitive     TRIMETH/SULFA <=10 SENSITIVE Sensitive     CLINDAMYCIN <=0.25 SENSITIVE Sensitive     RIFAMPIN <=0.5 SENSITIVE  Sensitive  Inducible Clindamycin NEGATIVE Sensitive     * FEW METHICILLIN RESISTANT STAPHYLOCOCCUS AUREUS  Culture, blood (routine x 2)     Status: None (Preliminary result)   Collection Time: 10/24/15  1:51 PM  Result Value Ref Range Status   Specimen Description BLOOD RIGHT ARM  Final   Special Requests BOTTLES DRAWN AEROBIC AND ANAEROBIC  7CC   Final   Culture NO GROWTH 3 DAYS  Final   Report Status PENDING  Incomplete  Culture, blood (routine x 2)     Status: None (Preliminary result)   Collection Time: 10/24/15  2:05 PM  Result Value Ref Range Status   Specimen Description BLOOD RIGHT ARM  Final   Special Requests BOTTLES DRAWN AEROBIC AND ANAEROBIC 10CC   Final   Culture NO GROWTH 3 DAYS  Final   Report Status PENDING  Incomplete  Blood Culture ID Panel (Reflexed)     Status: Abnormal   Collection Time: 10/24/15  2:05 PM  Result Value Ref Range Status   Enterococcus species NOT DETECTED NOT DETECTED Final   Vancomycin resistance NOT DETECTED NOT DETECTED Final   Listeria monocytogenes NOT DETECTED NOT DETECTED Final   Staphylococcus species DETECTED (A) NOT DETECTED Final    Comment: CRITICAL RESULT CALLED TO, READ BACK BY AND VERIFIED WITH: DR Drue SecondSNIDER 10/24/15 @ 1607 M VESTAL    Staphylococcus aureus DETECTED (A) NOT DETECTED Final    Comment: CRITICAL RESULT CALLED TO, READ BACK BY AND VERIFIED WITH: DR Drue SecondSNIDER 10/24/15 @ 1607 M VESTAL    Methicillin resistance NOT DETECTED NOT DETECTED Final   Streptococcus species NOT DETECTED NOT DETECTED Final   Streptococcus agalactiae NOT DETECTED NOT DETECTED Final   Streptococcus pneumoniae NOT DETECTED NOT DETECTED Final   Streptococcus pyogenes NOT DETECTED NOT DETECTED Final   Acinetobacter baumannii NOT DETECTED NOT DETECTED Final   Enterobacteriaceae species NOT DETECTED NOT DETECTED Final   Enterobacter cloacae complex NOT DETECTED NOT DETECTED Final   Escherichia coli NOT DETECTED NOT DETECTED Final   Klebsiella oxytoca  NOT DETECTED NOT DETECTED Final   Klebsiella pneumoniae NOT DETECTED NOT DETECTED Final   Proteus species NOT DETECTED NOT DETECTED Final   Serratia marcescens NOT DETECTED NOT DETECTED Final   Carbapenem resistance NOT DETECTED NOT DETECTED Final   Haemophilus influenzae NOT DETECTED NOT DETECTED Final   Neisseria meningitidis NOT DETECTED NOT DETECTED Final   Pseudomonas aeruginosa NOT DETECTED NOT DETECTED Final   Candida albicans NOT DETECTED NOT DETECTED Final   Candida glabrata NOT DETECTED NOT DETECTED Final   Candida krusei NOT DETECTED NOT DETECTED Final   Candida parapsilosis NOT DETECTED NOT DETECTED Final   Candida tropicalis NOT DETECTED NOT DETECTED Final  Culture, blood (routine x 2)     Status: None (Preliminary result)   Collection Time: 10/26/15  3:00 PM  Result Value Ref Range Status   Specimen Description BLOOD RIGHT ANTECUBITAL  Final   Special Requests BOTTLES DRAWN AEROBIC ONLY 5CC  Final   Culture NO GROWTH < 24 HOURS  Final   Report Status PENDING  Incomplete  Culture, blood (routine x 2)     Status: None (Preliminary result)   Collection Time: 10/26/15  3:07 PM  Result Value Ref Range Status   Specimen Description BLOOD RIGHT ANTECUBITAL  Final   Special Requests BOTTLES DRAWN AEROBIC ONLY 5CC  Final   Culture NO GROWTH < 24 HOURS  Final   Report Status PENDING  Incomplete  Body fluid culture  Status: None (Preliminary result)   Collection Time: 10/27/15  1:57 PM  Result Value Ref Range Status   Specimen Description FLUID ANKLE  Final   Special Requests Immunocompromised  Final   Gram Stain   Final    ABUNDANT WBC PRESENT, PREDOMINANTLY PMN MODERATE GRAM POSITIVE COCCI IN CLUSTERS    Culture   Final    MODERATE STAPHYLOCOCCUS AUREUS SUSCEPTIBILITIES TO FOLLOW    Report Status PENDING  Incomplete    Impression/Plan:  1. Disseminated MRSA - BCID with MSSA but cultures MRSA.  Septic emboli in lungs, repeat blood cultures sent.   Discussed with  Dr. Marchelle Gearing, still with high fevers only down due to cooling blanket.  I do not feel additional therapy or change in therapy though adds any benefit.  Most likely persistent, disseminated infection, now ankle debrided by ortho.  This may help decrease fever.    2.  Osteomyelitis - associated with soft tissue changes, fluid collection.   Thoracic surgery has seen and will reevaluate later, no acute indication for debridement.   3. Hand infection - had debridement 4. Septic arthritis - ankle debrided yesterday  Dr. Orvan Falconer to follow tomorrow
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
## 2015-10-28 NOTE — Progress Notes (Signed)
Rt found patient self extubated. O2 saturations were 92 Heart rate 133 respiratory rate 10. He is in no distress Placed patient on NRB. RN present in the room.

## 2015-10-28 NOTE — Progress Notes (Signed)
This RN called to bedside. RT already at bedside. Pt self extubated during wake up assessment. Propofol gtt was off, and fentanyl gtt turned off. Pt placed on NRB and following commands. Will continue to monitor and assess pt closely.

## 2015-10-28 NOTE — Progress Notes (Signed)
[facility] Progress Note  Subjective  Self extubated  WBC count normal today   Still with left shift but this is improving as well  H/H continuing to trend down  Aspirated ankle fluid also growing out gram positive cocci in clusters  ROS Moaning, not really answering questions at this time   Objective   BP 135/114 mmHg  Pulse 122  Temp(Src) 97.7 F (36.5 C) (Core (Comment))  Resp 29  Ht _0  (1.803 m)  Wt 96.5 kg (212 lb 11.9 oz)  BMI 29.68 kg/m2  SpO2 96%  Intake/Output      07/01 0701 - 07/02 0700 07/02 0701 - 07/03 0700   I.V. (mL/kg) 1670.4 (17.3) 173.2 (1.8)   NG/GT 1785 75   IV Piggyback 1000 50   Total Intake(mL/kg) 4455.4 (46.2) 298.2 (3.1)   Urine (mL/kg/hr) 2785 (1.2) 775 (1.1)   Blood 10 (0)    Total Output 2795 775   Net +1660.4 -476.8          Labs  Results for FERD, HORRIGAN (MRN [id number]) as of 10/28/2015 14:38  Ref. Range 10/28/2015 04:02  Sodium Latest Ref Range: 135-145 mmol/L 140  Potassium Latest Ref Range: 3.5-5.1 mmol/L 4.2  Chloride Latest Ref Range: 101-111 mmol/L 100 (L)  CO2 Latest Ref Range: 22-32 mmol/L 32  BUN Latest Ref Range: 6-20 mg/dL 15  Creatinine Latest Ref Range: 0.61-1.24 mg/dL 0.65  Calcium Latest Ref Range: 8.9-10.3 mg/dL 8.1 (L)  EGFR (Non-African Amer.) Latest Ref Range: >60 mL/min >60  EGFR (African American) Latest Ref Range: >60 mL/min >60  Glucose Latest Ref Range: 65-99 mg/dL 114 (H)  Anion gap Latest Ref Range: 5-15  8  Phosphorus Latest Ref Range: 2.5-4.6 mg/dL 4.2  Magnesium Latest Ref Range: 1.7-2.4 mg/dL 2.0  Albumin Latest Ref Range: 3.5-5.0 g/dL 1.0 (L)  Triglycerides Latest Ref Range: <150 mg/dL 397 (H)  WBC Latest Ref Range: 4.0-10.5 K/uL 10.2  RBC Latest Ref Range: 4.22-5.81 MIL/uL 3.00 (L)  Hemoglobin Latest Ref Range: 13.0-17.0 g/dL 8.1 (L)  HCT Latest Ref Range: 39.0-52.0 % 26.5 (L)  MCV Latest Ref Range: 78.0-100.0 fL 88.3  MCH Latest Ref Range: 26.0-34.0 pg 27.0  MCHC Latest Ref  Range: 30.0-36.0 g/dL 30.6  RDW Latest Ref Range: 11.5-15.5 % 16.5 (H)  Platelets Latest Ref Range: 150-400 K/uL 183  Neutrophils Latest Units: % 81  Lymphocytes Latest Units: % 9  Monocytes Relative Latest Units: % 9  Eosinophil Latest Units: % 1  Basophil Latest Units: % 0  NEUT# Latest Ref Range: 1.7-7.7 K/uL 8.3 (H)  Lymphocyte # Latest Ref Range: 0.7-4.0 K/uL 0.9  Monocyte # Latest Ref Range: 0.1-1.0 K/uL 0.9  Eosinophils Absolute Latest Ref Range: 0.0-0.7 K/uL 0.1  Basophils Absolute Latest Ref Range: 0.0-0.1 K/uL 0.0  WBC Morphology Unknown INCREASED BANDS (...    Exam  Gen: in bed moaning, not really participatory in exam  Ext:       B Lower Extremities  All dressings stable  Ext warm  Not moving toes for me   + DP pulses B   Knees and thighs look good      Assessment and Plan   POD/HD#: 1  25 y/o white male, IVDU with multiple septic emboli    - Disseminated MRSA bacteremia   S/p multiple I&D's of extremities  Osteomyelitis of sternum   On IV abx  - L foot abscess s/p I&D             dressing change  tomorrow by nursing   4x4's, kerlix and ace     - R ankle septic arthritis, R lower leg abscess s/p I&D             dressing change tomorrow   See Dr. Trevor Mace note  Continue with PRAFO boot   IV Abx   - Osteomyelitis of sternum             CVTS               - Pain management:             Per CCM  - Medical issues               Per CCM   - DVT/PE prophylaxis:             Heprain   - ID:               Vancomycin   - Dispo:            continue with current care  Monitor extremities for other signs of ongoing infection   Labs do appear to be improving                  [name], PA-C [facility] [phone]) 10/28/2015 2:35 PM
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
## 2015-10-28 NOTE — Progress Notes (Signed)
Patient ID: Kevin Austin, male   DOB: 01-Jan-1991, 25 y.o.   MRN: [id number] Has been afebrile since surgery yesterday for I&D of his right ankle.  Found significant gross purulence all around his right ankle.  WBC down to normal.  Extubated.  Dressings on right ankle clean and dry.  Will change the dressings on his right ankle tomorrow.

## 2015-10-28 NOTE — Progress Notes (Signed)
Pharmacy Antibiotic Note  Kevin Austin is a 25 y.o. male admitted on 10/22/2015 with pneumonia and bacteremia.  Pharmacy has been consulted for vancomycin dosing.  Tr = 20, 17 on 1500 mg q8h.   Plan: Continue vancomycin 1500 mg q8h Will closely watch renal fx and VT on this high dose    Height: 5\' 11"  (180.3 cm) Weight: 212 lb 11.9 oz (96.5 kg) IBW/kg (Calculated) : 75.3  Temp (24hrs), Avg:98.4 F (36.9 C), Min:97.2 F (36.2 C), Max:99.5 F (37.5 C)   Recent Labs Lab 10/22/15 1844  10/24/15 0926  10/25/15 0440 10/25/15 0648 10/26/15 0430 10/27/15 0540 10/27/15 1601 10/27/15 1847 10/28/15 0402 10/28/15 1420  WBC  --   < > 19.1*  --   --  17.2* 14.5* 13.3*  --   --  10.2  --   CREATININE 0.85  --  0.53*  --  0.41*  --  0.49* 0.49*  0.49*  --  0.54* 0.65  --   LATICACIDVEN 1.8  --   --   --   --   --   --   --   --   --   --   --   VANCOTROUGH  --   --   --   < >  --   --  14*  --  20  --   --  17  < > = values in this interval not displayed.  Estimated Creatinine Clearance: 170.2 mL/min (by C-G formula based on Cr of 0.65).    Allergies  Allergen Reactions  . [facility]ac Nausea Only    [facility]  . Tramadol Nausea Only    Per [facility]    Antimicrobials this admission: Vanc 6/26 >  Zosyn 6/26 >>6/27 Cefepime 6/26 >6/26 Ceftaz 6/27>>6/28  Dose adjustments this admission: 6/28 VT = on 1g q8h 6/30 VT = 14 on 1250 q8 7/1 VT = 20 on 1500 q8 7/2 VT = 17 on 1500 q8  Microbiology results: 6/26 BCx2: staph aureus 6/26 BCID: MRSA 6/26 UCx: sent 6/26 Sputum: staph aureus 6/26 MRSA PCR: pos Multiple Wounds/Abscess = MRSA  Isaac BlissMichael Tanisa Lagace, PharmD, BCPS, [facility] Clinical Pharmacist Pager [phone] 10/28/2015 3:40 PM

## 2015-10-29 ENCOUNTER — Inpatient Hospital Stay (HOSPITAL_COMMUNITY): Payer: Medicaid Other

## 2015-10-29 ENCOUNTER — Encounter (HOSPITAL_COMMUNITY): Payer: Self-pay | Admitting: Orthopaedic Surgery

## 2015-10-29 DIAGNOSIS — B9562 Methicillin resistant Staphylococcus aureus infection as the cause of diseases classified elsewhere: Secondary | ICD-10-CM | POA: Diagnosis present

## 2015-10-29 DIAGNOSIS — M869 Osteomyelitis, unspecified: Secondary | ICD-10-CM | POA: Diagnosis present

## 2015-10-29 DIAGNOSIS — J15212 Pneumonia due to Methicillin resistant Staphylococcus aureus: Secondary | ICD-10-CM | POA: Diagnosis present

## 2015-10-29 DIAGNOSIS — R7881 Bacteremia: Secondary | ICD-10-CM | POA: Diagnosis present

## 2015-10-29 DIAGNOSIS — J9602 Acute respiratory failure with hypercapnia: Secondary | ICD-10-CM

## 2015-10-29 DIAGNOSIS — J189 Pneumonia, unspecified organism: Secondary | ICD-10-CM

## 2015-10-29 LAB — GLUCOSE, CAPILLARY
GLUCOSE-CAPILLARY: 115 mg/dL — AB (ref 65–99)
GLUCOSE-CAPILLARY: 80 mg/dL (ref 65–99)
GLUCOSE-CAPILLARY: 98 mg/dL (ref 65–99)
Glucose-Capillary: 67 mg/dL (ref 65–99)
Glucose-Capillary: 111 mg/dL — ABNORMAL HIGH (ref 65–99)
Glucose-Capillary: 104 mg/dL — ABNORMAL HIGH (ref 65–99)

## 2015-10-29 LAB — CBC WITH DIFFERENTIAL/PLATELET
BASOS ABS: 0.1 10*3/uL (ref 0.0–0.1)
Basophils Relative: 1 %
Eosinophils Absolute: 0.1 10*3/uL (ref 0.0–0.7)
Eosinophils Relative: 1 %
HEMATOCRIT: 25.8 % — AB (ref 39.0–52.0)
Hemoglobin: 8.2 g/dL — ABNORMAL LOW (ref 13.0–17.0)
LYMPHS ABS: 1.8 10*3/uL (ref 0.7–4.0)
LYMPHS PCT: 15 %
MCH: 27.5 pg (ref 26.0–34.0)
MCHC: 31.8 g/dL (ref 30.0–36.0)
MCV: 86.6 fL (ref 78.0–100.0)
MONOS PCT: 9 %
Monocytes Absolute: 1.1 10*3/uL — ABNORMAL HIGH (ref 0.1–1.0)
NEUTROS PCT: 74 %
Neutro Abs: 8.9 10*3/uL — ABNORMAL HIGH (ref 1.7–7.7)
PLATELETS: 317 10*3/uL (ref 150–400)
RBC: 2.98 MIL/uL — AB (ref 4.22–5.81)
RDW: 15.8 % — AB (ref 11.5–15.5)
WBC MORPHOLOGY: INCREASED
WBC: 12 10*3/uL — AB (ref 4.0–10.5)

## 2015-10-29 LAB — POCT I-STAT 3, ART BLOOD GAS (G3+)
ACID-BASE EXCESS: 8 mmol/L — AB (ref 0.0–2.0)
ACID-BASE EXCESS: 12 mmol/L — AB (ref 0.0–2.0)
BICARBONATE: 32 meq/L — AB (ref 20.0–24.0)
Bicarbonate: 37.7 mEq/L — ABNORMAL HIGH (ref 20.0–24.0)
O2 SAT: 93 %
O2 SAT: 100 %
PCO2 ART: 54.5 mmHg — AB (ref 35.0–45.0)
PH ART: 7.482 — AB (ref 7.350–7.450)
PH ART: 7.446 (ref 7.350–7.450)
PO2 ART: 66 mmHg — AB (ref 80.0–100.0)
Patient temperature: 101.2
TCO2: 33 mmol/L (ref 0–100)
TCO2: 39 mmol/L (ref 0–100)
pCO2 arterial: 43.2 mmHg (ref 35.0–45.0)
pO2, Arterial: 429 mmHg — ABNORMAL HIGH (ref 80.0–100.0)

## 2015-10-29 LAB — CULTURE, BLOOD (ROUTINE X 2)
Culture: NO GROWTH
Culture: NO GROWTH

## 2015-10-29 LAB — RENAL FUNCTION PANEL
ALBUMIN: 1.2 g/dL — AB (ref 3.5–5.0)
ANION GAP: 19 — AB (ref 5–15)
BUN: 19 mg/dL (ref 6–20)
CO2: 31 mmol/L (ref 22–32)
Calcium: 9.3 mg/dL (ref 8.9–10.3)
Chloride: 96 mmol/L — ABNORMAL LOW (ref 101–111)
Creatinine, Ser: 0.58 mg/dL — ABNORMAL LOW (ref 0.61–1.24)
Glucose, Bld: 90 mg/dL (ref 65–99)
PHOSPHORUS: 4.3 mg/dL (ref 2.5–4.6)
POTASSIUM: 4.2 mmol/L (ref 3.5–5.1)
Sodium: 146 mmol/L — ABNORMAL HIGH (ref 135–145)

## 2015-10-29 LAB — MAGNESIUM
MAGNESIUM: 2 mg/dL (ref 1.7–2.4)
Magnesium: 1.9 mg/dL (ref 1.7–2.4)
Magnesium: 2 mg/dL (ref 1.7–2.4)

## 2015-10-29 LAB — PHOSPHORUS
PHOSPHORUS: 4.4 mg/dL (ref 2.5–4.6)
PHOSPHORUS: 5.3 mg/dL — AB (ref 2.5–4.6)
Phosphorus: 4.3 mg/dL (ref 2.5–4.6)

## 2015-10-29 LAB — TRIGLYCERIDES
TRIGLYCERIDES: 364 mg/dL — AB (ref <150)
Triglycerides: 356 mg/dL — ABNORMAL HIGH (ref <150)

## 2015-10-29 MED ORDER — DEXTROSE 50 % IV SOLN
25.0000 mL | Freq: Once | INTRAVENOUS | Status: AC
  Administered 2015-10-29: 25 mL via INTRAVENOUS

## 2015-10-29 MED ORDER — DOCUSATE SODIUM 50 MG/5ML PO LIQD: 100.0000 mg | Freq: Two times a day (BID) | ORAL | Status: DC | PRN

## 2015-10-29 MED ORDER — PROPOFOL 1000 MG/100ML IV EMUL
INTRAVENOUS | Status: AC
  Filled 2015-10-29: qty 100

## 2015-10-29 MED ORDER — MIDAZOLAM HCL 2 MG/2ML IJ SOLN
INTRAMUSCULAR | Status: AC
  Administered 2015-10-29: 4 mg via INTRAVENOUS
  Filled 2015-10-29: qty 4

## 2015-10-29 MED ORDER — FENTANYL CITRATE (PF) 100 MCG/2ML IJ SOLN
100.0000 ug | Freq: Once | INTRAMUSCULAR | Status: AC
  Administered 2015-10-29: 100 ug via INTRAVENOUS

## 2015-10-29 MED ORDER — FENTANYL CITRATE (PF) 100 MCG/2ML IJ SOLN
INTRAMUSCULAR | Status: AC
  Administered 2015-10-29: 100 ug via INTRAVENOUS
  Filled 2015-10-29: qty 2

## 2015-10-29 MED ORDER — ETOMIDATE 2 MG/ML IV SOLN
20.0000 mg | Freq: Once | INTRAVENOUS | Status: AC
  Administered 2015-10-29: 20 mg via INTRAVENOUS

## 2015-10-29 MED ORDER — VITAL HIGH PROTEIN PO LIQD
1000.0000 mL | ORAL | Status: DC
  Administered 2015-10-30 – 2015-10-31 (×3): 1000 mL
  Filled 2015-10-29 (×3): qty 1000

## 2015-10-29 MED ORDER — PRO-STAT SUGAR FREE PO LIQD
30.0000 mL | Freq: Two times a day (BID) | ORAL | Status: DC
  Administered 2015-10-29: 30 mL
  Filled 2015-10-29: qty 30

## 2015-10-29 MED ORDER — FENTANYL BOLUS VIA INFUSION
50.0000 ug | INTRAVENOUS | Status: DC | PRN
  Administered 2015-10-31 – 2015-11-01 (×5): 50 ug via INTRAVENOUS
  Administered 2015-11-01 (×2): 100 ug via INTRAVENOUS
  Administered 2015-11-01 – 2015-11-07 (×12): 50 ug via INTRAVENOUS
  Filled 2015-10-29: qty 50

## 2015-10-29 MED ORDER — DEXTROSE 50 % IV SOLN
INTRAVENOUS | Status: AC
  Administered 2015-10-29: 25 mL via INTRAVENOUS
  Filled 2015-10-29: qty 50

## 2015-10-29 MED ORDER — MIDAZOLAM HCL 2 MG/2ML IJ SOLN
4.0000 mg | Freq: Once | INTRAMUSCULAR | Status: AC
  Administered 2015-10-29: 4 mg via INTRAVENOUS

## 2015-10-29 MED ORDER — PROPOFOL 1000 MG/100ML IV EMUL
0.0000 ug/kg/min | INTRAVENOUS | Status: DC
  Administered 2015-10-29 – 2015-10-30 (×12): 40 ug/kg/min via INTRAVENOUS
  Administered 2015-10-31: 45 ug/kg/min via INTRAVENOUS
  Administered 2015-10-31: 50 ug/kg/min via INTRAVENOUS
  Administered 2015-10-31: 45 ug/kg/min via INTRAVENOUS
  Administered 2015-10-31: 40 ug/kg/min via INTRAVENOUS
  Administered 2015-10-31: 45 ug/kg/min via INTRAVENOUS
  Administered 2015-10-31 – 2015-11-01 (×3): 50 ug/kg/min via INTRAVENOUS
  Administered 2015-11-01: 16:00:00 via INTRAVENOUS
  Administered 2015-11-01 (×4): 50 ug/kg/min via INTRAVENOUS
  Administered 2015-11-01: 65 ug/kg/min via INTRAVENOUS
  Administered 2015-11-02 – 2015-11-03 (×9): 50 ug/kg/min via INTRAVENOUS
  Filled 2015-10-29 (×6): qty 100
  Filled 2015-10-29: qty 200
  Filled 2015-10-29: qty 100
  Filled 2015-10-29 (×2): qty 200
  Filled 2015-10-29 (×22): qty 100

## 2015-10-29 MED ORDER — SODIUM CHLORIDE 0.9 % IV SOLN
25.0000 ug/h | INTRAVENOUS | Status: DC
  Administered 2015-10-29: 300 ug/h via INTRAVENOUS
  Administered 2015-10-29: 400 ug/h via INTRAVENOUS
  Administered 2015-10-29: 200 ug/h via INTRAVENOUS
  Administered 2015-10-30 – 2015-11-03 (×15): 400 ug/h via INTRAVENOUS
  Administered 2015-11-03: 300 ug/h via INTRAVENOUS
  Administered 2015-11-03: 250 ug/h via INTRAVENOUS
  Administered 2015-11-04: 300 ug/h via INTRAVENOUS
  Administered 2015-11-04 – 2015-11-08 (×16): 400 ug/h via INTRAVENOUS
  Administered 2015-11-08: 200 ug/h via INTRAVENOUS
  Filled 2015-10-29 (×39): qty 50

## 2015-10-29 MED ORDER — FENTANYL CITRATE (PF) 100 MCG/2ML IJ SOLN: 50.0000 ug | Freq: Once | INTRAMUSCULAR | Status: DC

## 2015-10-29 MED ORDER — CHLORHEXIDINE GLUCONATE 0.12% ORAL RINSE (MEDLINE KIT)
15.0000 mL | Freq: Two times a day (BID) | OROMUCOSAL | Status: DC
Start: 2015-10-29 — End: 2015-11-14
  Administered 2015-10-29 – 2015-11-13 (×27): 15 mL via OROMUCOSAL

## 2015-10-29 MED ORDER — ANTISEPTIC ORAL RINSE SOLUTION (CORINZ)
7.0000 mL | Freq: Four times a day (QID) | OROMUCOSAL | Status: DC
  Administered 2015-10-30 – 2015-11-13 (×56): 7 mL via OROMUCOSAL

## 2015-10-29 MED ORDER — VITAL HIGH PROTEIN PO LIQD
1000.0000 mL | ORAL | Status: DC
  Administered 2015-10-29: 1000 mL
  Filled 2015-10-29: qty 1000

## 2015-10-29 NOTE — Progress Notes (Signed)
Wasted 200mls of Fentanyl gtt in sink with Telford NabValerie Smith, RN.
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
## 2015-10-29 NOTE — Progress Notes (Signed)
Patient ID: Kevin Austin, male   DOB: April 20, 1991, 25 y.o.   MRN: [id number] Post-op day #2 from I&D of his right ankle due to a septic ankle joint and an abscess in the surrounding soft-tissue.  Re-intubated.  Temp spikes again and WBC back up. Dressing changed.  Redness and swelling have subsided in this area greatly.  Continue IV antibiotics.  Hand wounds and left foot per other [facility] Services.

## 2015-10-29 NOTE — Procedures (Signed)
Intubation Procedure Note Kevin Austin [id number] 11/27/1990  Procedure: Intubation Indications: Respiratory insufficiency  Procedure Details Consent: Unable to obtain consent because of altered level of consciousness. Time Out: Verified patient identification, verified procedure, site/side was marked, verified correct patient position, special equipment/implants available, medications/allergies/relevent history reviewed, required imaging and test results available. Time out performed.   Maximum sterile technique was used including gloves, gown, hand hygiene and mask.  MAC and 4  Pt self extubated roughly 24 hrs ago and with increased WOB the last 6-12 hrs. Seen in resp distress with alar flaring, RR in the 40s. ABG with pO2 66 on 6L.   With gleidoscope use, it was difficult to [facility] as they were posteriorly located.  I had to use regular laryngoscope and ETT was inserted with some difficulty as it was tight. ETT size 7.5 inserted. Good color change seen. CXR is pending.   Pt received 4 mg Versed, 100 mcg Fentanyl, 100 mg Propofol, 20 mg Etomidate >> all IV and in divided doses. Pt was difficult to sedate 2/2 drug use.  We had to use Propofol.     Evaluation Hemodynamic Status: BP stable throughout; O2 sats: stable throughout Patient's Current Condition: stable Complications: No apparent complications Patient did tolerate procedure well. Chest X-ray ordered to verify placement.  CXR: pending.   Jose Bridgette Habermannngelo A De Pompano BeachDios 10/29/2015

## 2015-10-29 NOTE — Progress Notes (Signed)
[place] for Infectious Disease    Date of Admission:  10/22/2015   Total days of antibiotics 8        Day 8 vancomycin        1 Day zosyn and fortaz 6/26-6/27  Principal Problem:   MRSA bacteremia Active Problems:   Acute respiratory failure (HCC)   Abscess   Altered mental status   Encounter for central line placement   Bilateral pneumonia   Sternal osteomyelitis ([place])   . antiseptic oral rinse  7 mL Mouth Rinse BID  . chlorhexidine gluconate (SAGE KIT)  15 mL Mouth Rinse BID  . famotidine (PEPCID) IV  20 mg Intravenous Q12H  . [START ON 10/30/2015] feeding supplement (VITAL HIGH PROTEIN)  1,000 mL Per Tube Q24H  . fentaNYL (SUBLIMAZE) injection  50 mcg Intravenous Once  . heparin  5,000 Units Subcutaneous Q8H  . methadone  20 mg Per Tube Q8H  . propofol      . sodium chloride  1,000 mL Intravenous Once  . vancomycin  1,500 mg Intravenous Q8H    SUBJECTIVE: Patient self extubated yesterday but is reintubated and sedated today for increased work of breathing.  Review of Systems: Review of Systems  Unable to perform ROS: intubated    History reviewed. No pertinent past medical history.  Social History  Substance Use Topics  . Smoking status: None  . Smokeless tobacco: None  . Alcohol Use: None    No family history on file. Allergies  Allergen Reactions  . Ketorolac Nausea Only    Per [facility] records  . Tramadol Nausea Only    Per [facility] records    OBJECTIVE: Filed Vitals:   10/29/15 0826 10/29/15 1002 10/29/15 1139 10/29/15 1147  BP:  101/45  103/53  Pulse:  88  109  Temp: 98.7 F (37.1 C)  99.2 F (37.3 C)   TempSrc: Oral  Rectal   Resp:  15  15  Height:      Weight:      SpO2:  96%  99%   Body mass index is 29.59 kg/(m^2).  Physical Exam  HENT:  Head: Normocephalic.  Eyes: Conjunctivae are normal.  Cardiovascular: Normal rate, regular rhythm and normal heart sounds.   Pulmonary/Chest:  Ventilator breath sounds, air  movement bilaterally. Fullness with slight fluctuance over her midsternum.  Abdominal: Soft.  Musculoskeletal:  2+ pitting edema of bilateral lower extremities. Ace wraps on bilateral hand and bilateral foot wounds.  Neurological:  Sedated  Skin: Skin is warm. Petechiae and rash noted. Rash is papular. He is not diaphoretic. No erythema.  Several unroofed skin lesions with excoriations    Lab Results Lab Results  Component Value Date   WBC 12.0* 10/29/2015   HGB 8.2* 10/29/2015   HCT 25.8* 10/29/2015   MCV 86.6 10/29/2015   PLT 317 10/29/2015    Lab Results  Component Value Date   CREATININE 0.58* 10/29/2015   BUN 19 10/29/2015   NA 146* 10/29/2015   K 4.2 10/29/2015   CL 96* 10/29/2015   CO2 31 10/29/2015    Lab Results  Component Value Date   ALT 20 10/26/2015   AST 46* 10/26/2015   ALKPHOS 282* 10/26/2015   BILITOT 5.6* 10/26/2015     Microbiology:  Collection Time: 10/26/15  3:07 PM  Result Value Ref Range Status   Specimen Description BLOOD RIGHT ANTECUBITAL  Final   Special Requests BOTTLES DRAWN AEROBIC ONLY 5CC  Final  Culture NO GROWTH 3 DAYS  Final   Report Status PENDING  Incomplete  Body fluid culture     Status: None (Preliminary result)   Collection Time: 10/27/15  1:57 PM  Result Value Ref Range Status   Specimen Description FLUID ANKLE  Final   Special Requests Immunocompromised  Final   Gram Stain   Final    ABUNDANT WBC PRESENT, PREDOMINANTLY PMN MODERATE GRAM POSITIVE COCCI IN CLUSTERS    Culture   Final    MODERATE METHICILLIN RESISTANT STAPHYLOCOCCUS AUREUS   Report Status PENDING  Incomplete   Organism ID, Bacteria METHICILLIN RESISTANT STAPHYLOCOCCUS AUREUS  Final      Susceptibility   Methicillin resistant staphylococcus aureus - MIC*    CIPROFLOXACIN <=0.5 SENSITIVE Sensitive     ERYTHROMYCIN >=8 RESISTANT Resistant     GENTAMICIN <=0.5 SENSITIVE Sensitive     OXACILLIN >=4 RESISTANT Resistant     TETRACYCLINE <=1 SENSITIVE  Sensitive     VANCOMYCIN <=0.5 SENSITIVE Sensitive     TRIMETH/SULFA <=10 SENSITIVE Sensitive     CLINDAMYCIN <=0.25 SENSITIVE Sensitive     RIFAMPIN <=0.5 SENSITIVE Sensitive     Inducible Clindamycin NEGATIVE Sensitive     * MODERATE METHICILLIN RESISTANT STAPHYLOCOCCUS AUREUS    ASSESSMENT: Disseminated MRSA bacteremia: Multiple extremity abscesses plus radiographic evidence of sternal osteomyelitis, septi emboli, and renal emboli. Blood cultures are negative starting from 6/30 which was 4 days into antibiotic treatment. He has had debridement of both hands now with I&D of R ankle on 7/1. Fortunately TEE negative for vegetations on 6/28. He continues to have moderately high fever being managed with a cooling blanket. Organism susceptible to vancomycin so he is appropriately covered although the persistence may be related to source control given his multiple abscesses and osteomyelitis.  Osteomyelitis of the sternum: widening of sternomanubrial junction and retrosternal fluid collection on chest CT. This would be consistent with his otherwise disseminated involvement of skin, lungs, kidneys, both hands, and both feet. May have problems with source control if true abscess present.  Septic arthritis of the right ankle: I&D on 7/1 with purulent MRSA positive infection.   Soft tissue abscesses: S/p I&D of hands and feet, also purulent and positive for MRSA  Acute respiratory failure: Failed with high WOB after self extubation yesterday. CXR continuing to show patchy left sided infiltrates which have been present since admission but now with bilateral markings. Some aspirate debris was noted in R mainstem bronchus on imaging from 6/30, in conjunction with a recent self extubation could consider a new superimposed area of aspiration pneumonia if persistent.  PLAN: 1. Continue vancomycin with trough monitoring, end date 2. Appreciate CVTS to follow up possible sternal osteomyelitis and fluid  collection 3. Will need to exchange current IJ catheter if not already since 6/30, given it was placed with culture positive MRSA bacteremia  Collier Salina, MD PGY-I Internal Medicine Resident Pager# [phone] 10/29/2015, 12:33 PM   Addendum: Although Mr. Rozario has no echocardiographic evidence of endocarditis he has widely disseminated MRSA infection complicated by infections in both hands, both feet, sternal osteomyelitis with surrounding phlegmon and pneumonia. Repeat blood cultures are negative at 72 hours. We will continue vancomycin.  Michel Bickers, MD [facility] [phone] pager   [phone] cell 10/29/2015, 3:55 PM

## 2015-10-29 NOTE — Progress Notes (Signed)
Nutrition Follow-up / Consult  DOCUMENTATION CODES:   Not applicable  INTERVENTION:    Continue TF via OGT with Vital High Protein, increase to goal rate of 75 ml/h (1800 ml per day) to provide 1800 kcals, 158 gm protein, 1505 ml free water daily.  Total calorie intake with TF + Propofol will be 2412 kcal.  NUTRITION DIAGNOSIS:   Inadequate oral intake related to inability to eat as evidenced by NPO status.  Ongoing  GOAL:   Patient will meet greater than or equal to 90% of their needs  Unmet   MONITOR:   Vent status, Labs, Weight trends, TF tolerance, Skin, I & O's  REASON FOR ASSESSMENT:   Consult Enteral/tube feeding initiation and management  ASSESSMENT:   25 year old admitted with severe sepsis, multiple septic emboli. 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  6/27 self-extubated 6/27 I&D bil hands and left foot in OR; remained on vent post-op 7/01 I&D right foot 7/02 self-extubated 7/03 re-intubated  PT performing hydrotherapy to hands. Also being treated for osteomyelitis of sternum. Receiving IV antibiotics. Labs and medications reviewed. Sodium elevated. Triglycerides elevated at 356. CBG's: 80-67-111-98 Patient is currently receiving Vital High Protein via OGT at 40 ml/h (960 ml/day) with Prostat 30 ml BID to provide 1160 kcals, 114 gm protein, 803 ml free water daily.  Received MD Consult for TF initiation and management. Patient is currently intubated on ventilator support MV: 9 L/min Temp (24hrs), Avg:99.2 F (37.3 C), Min:97.7 F (36.5 C), Max:101.2 F (38.4 C)  Propofol: 23.2 ml/hr providing 612 kcals per day from lipids  Diet Order:   NPO  Skin:  Wound (see comment) (abscesses to foot and hand, s/p I&D 6/27)  Last BM:  PTA  Height:   Ht Readings from Last 1 Encounters:  10/22/15 5\' 11"  (1.803 m)    Weight:   Wt Readings from Last 1 Encounters:  10/29/15 212 lb 1.3 oz (96.2 kg)   10/24/15 194 lb 0.1 oz (88 kg)         Ideal Body Weight:  78.2 kg  BMI:  Body mass index is 29.59 kg/(m^2).  Estimated Nutritional Needs:   Kcal:  2380  Protein:  140-160 gm  Fluid:  2.5 L  EDUCATION NEEDS:   No education needs identified at this time  Joaquin CourtsKimberly Harris, RD, LDN, CNSC Pager [phone] After Hours Pager [phone]

## 2015-10-29 NOTE — Progress Notes (Signed)
Patient with increasing pain. Respiratory rate increasing and patient desatting into high 80s. eLink MD made aware, unable to control patient's pain while extubated. Kevin Slatese Dios, MD @ bedside. Patient successfully intubated, see intubation note from MD.

## 2015-10-29 NOTE — Progress Notes (Signed)
Hypoglycemic Event  CBG: 67  Treatment: D50 IV 25 mL  Symptoms: None  Follow-up CBG: Time:0445 CBG Result: 111  Possible Reasons for Event: Unknown  Comments/MD notified:1/2 amp administered    Clarene EssexLindsey E Walsh

## 2015-10-29 NOTE — Progress Notes (Signed)
   Assessment: 5 Days Post-Op  S/P Procedure(s) (LRB): IRRIGATION AND DEBRIDEMENT FOOT (Left) by Dr. Jewel Baizeimothy D. Murphy on 10/24/15  Active Problems:   Acute respiratory failure (HCC)   Abscess   Altered mental status   Encounter for central line placement   Acute respiratory failure with hypercapnia (HCC)   Bilateral pneumonia   Sternal osteomyelitis (HCC)  Disseminated MRSA bacteremia   H/H stable.  WBC up slightly.  Re-intubated this A.M. Dt pain "all over."   Left Foot.  Surgical site/wound improving.  Decreased swelling.  Loose suture in place - center of would open to drain.  Scant serosanguinous drainage.  Left ankle palpation and passive ROM does not illicit pain response.  Non-erythematous. Left knee and thigh without swelling/erythema.   S/p multiple I&D's of extremities.    Osteomyelitis of sternum per CVTS  Right ankle with increasing pain/redness sign of infection over weekend.  I/D by Dr. Magnus IvanBlackman.   Plan: Continue Abx and plan per CCM. Loose suture in place to allow drainage.   Continue pressure gauze dressing / kerlix / ace wrap.  Continue to follow extremities for new/worsening infection.  Weight Bearing: Weight Bearing as Tolerated (WBAT) left foot Dressings: Gauze / Kerlix / Ace Daily.  Sutures to be removed in ~10 days post op. VTE prophylaxis: on sq heparin Dispo: per primary  Subjective: Intubated, sedated.   Objective:   VITALS:   Filed Vitals:   10/29/15 0700 10/29/15 0759 10/29/15 0800 10/29/15 0826  BP: 101/49 108/61 108/61   Pulse: 86 78 78   Temp:    98.7 F (37.1 C)  TempSrc:    Oral  Resp: 16 15 15    Height:      Weight:      SpO2: 100% 100% 100%     Lab Results  Component Value Date   WBC 12.0* 10/29/2015   HGB 8.2* 10/29/2015   HCT 25.8* 10/29/2015   MCV 86.6 10/29/2015   PLT 317 10/29/2015    Physical Exam General: NAD.  Intubated, sedated  MSK: Left Foot:  Intact pulses distally.  Foot warm.   Surgical site/wound  improving.  Decreased swelling.  Loose suture in place - center of would open to drain.  Scant serosanguinous drainage.  Left ankle palpation and passive ROM does not illicit pain response.  Non-erythematous.     Kevin Austin 10/29/2015, 9:16 AM

## 2015-10-29 NOTE — Progress Notes (Signed)
eLink Physician-Brief Progress Note Patient Name: Kevin Austin DOB: 1991/02/11 MRN: [id number]   Date of Service  10/29/2015  HPI/Events of Note  Nurses concerned about respiratory status. Sat = 91% and RR =35-40. Also coughing up bloody secretions.   eICU Interventions  Will ask Dr. Christene Slatese Dios to evaluate the patient at bedside to see if he feels he needs to be re-intubated.      Intervention Category Intermediate Interventions: Respiratory distress - evaluation and management  Sommer,Steven Eugene 10/29/2015, 5:14 AM

## 2015-10-29 NOTE — Progress Notes (Signed)
[facility] Progress Note  Subjective  Re-intubated early this am due to increased WOB/acute respiratory distress Spiking fevers still WBC count trending up, mild increased left shift  Therapy at bedside do hydrotherapy to hands   Dr. Ninfa Linden has already changed dressing to R foot/ankle   Review of Systems  Unable to perform ROS: intubated     Objective   BP 108/61 mmHg  Pulse 78  Temp(Src) 98.7 F (37.1 C) (Oral)  Resp 15  Ht '5\' 11"'  (1.803 m)  Wt 96.2 kg (212 lb 1.3 oz)  BMI 29.59 kg/m2  SpO2 100%  Intake/Output      07/02 0701 - 07/03 0700 07/03 0701 - 07/04 0700   I.V. (mL/kg) 850.5 (8.8) 69.4 (0.7)   Other 50    NG/GT 75    IV Piggyback 1600    Total Intake(mL/kg) 2575.5 (26.8) 69.4 (0.7)   Urine (mL/kg/hr) 900 (0.4)    Blood     Total Output 900     Net +1675.5 +69.4        Urine Occurrence 3 x      Labs  Results for Kevin Austin, AVEY (MRN [id number]) as of 10/29/2015 09:36  Ref. Range 10/29/2015 04:25 10/29/2015 04:25  Sodium Latest Ref Range: 135-145 mmol/L 146 (H)   Potassium Latest Ref Range: 3.5-5.1 mmol/L 4.2   Chloride Latest Ref Range: 101-111 mmol/L 96 (L)   CO2 Latest Ref Range: 22-32 mmol/L 31   BUN Latest Ref Range: 6-20 mg/dL 19   Creatinine Latest Ref Range: 0.61-1.24 mg/dL 0.58 (L)   Calcium Latest Ref Range: 8.9-10.3 mg/dL 9.3   EGFR (Non-African Amer.) Latest Ref Range: >60 mL/min >60   EGFR (African American) Latest Ref Range: >60 mL/min >60   Glucose Latest Ref Range: 65-99 mg/dL 90   Anion gap Latest Ref Range: 5-15  19 (H)   Phosphorus Latest Ref Range: 2.5-4.6 mg/dL 4.3 4.3  Magnesium Latest Ref Range: 1.7-2.4 mg/dL 1.9   Albumin Latest Ref Range: 3.5-5.0 g/dL 1.2 (L)   Triglycerides Latest Ref Range: <150 mg/dL 356 (H)   WBC Latest Ref Range: 4.0-10.5 K/uL 12.0 (H)   RBC Latest Ref Range: 4.22-5.81 MIL/uL 2.98 (L)   Hemoglobin Latest Ref Range: 13.0-17.0 g/dL 8.2 (L)   HCT Latest Ref Range: 39.0-52.0 % 25.8 (L)   MCV  Latest Ref Range: 78.0-100.0 fL 86.6   MCH Latest Ref Range: 26.0-34.0 pg 27.5   MCHC Latest Ref Range: 30.0-36.0 g/dL 31.8   RDW Latest Ref Range: 11.5-15.5 % 15.8 (H)   Platelets Latest Ref Range: 150-400 K/uL 317   Neutrophils Latest Units: % 74   Lymphocytes Latest Units: % 15   Monocytes Relative Latest Units: % 9   Eosinophil Latest Units: % 1   Basophil Latest Units: % 1   NEUT# Latest Ref Range: 1.7-7.7 K/uL 8.9 (H)   Lymphocyte # Latest Ref Range: 0.7-4.0 K/uL 1.8   Monocyte # Latest Ref Range: 0.1-1.0 K/uL 1.1 (H)   Eosinophils Absolute Latest Ref Range: 0.0-0.7 K/uL 0.1   Basophils Absolute Latest Ref Range: 0.0-0.1 K/uL 0.1   WBC Morphology Unknown INCREASED BANDS (...    Body fluid culture  Status: Preliminary result     Visible to patient:  Not Released     Next appt: None              2d ago     Specimen Description FLUID ANKLE    Special Requests Immunocompromised    Gram Stain ABUNDANT  WBC PRESENT, PREDOMINANTLY PMN  MODERATE GRAM POSITIVE COCCI IN CLUSTERS        Culture MODERATE METHICILLIN RESISTANT STAPHYLOCOCCUS AUREUS    Report Status PENDING    Organism ID, Bacteria METHICILLIN RESISTANT STAPHYLOCOCCUS AUREUS    Resulting Agency [facility]         Exam  Gen: intubated and sedated  Lungs/chest: swelling over sternum. No erythema or open wounds  Ext:       B Hands  Hydrotherapy session in progress  All wounds look fantastic  Soft tissue looks viable       B Lower Extremities  Dressing R ankle has been changed  L foot wound stable   Minimal swelling along L foot   No erythema  No active purulence  No other areas of concern to the B LEx  No appreciable pain response with axial loading or log rolling of hips  Thigh soft tissue unremarkable  Again lower extremities have multiple punctate scabs, presumably from picking   No knee effusions noted, no erythema    Assessment and Plan   POD/HD#: 2  25 y/o white male, IVDU with multiple septic  emboli    - Disseminated MRSA bacteremia               S/p multiple I&D's of extremities             Osteomyelitis of sternum              On IV abx   May need to evaluate for other sources if fevers and elevated WBC count persist   Could consider CT +/- MRI of abdomen and pelvis. Have had pts with hx of IVDU develop psoas abscess +/- septic SI joint. Clinical exam limited as pt intubated and sedated  Continue with close follow up   - L foot abscess s/p I&D             dressing changed  Wound stable  Continue with current dressing of 4x4's, kerlix and ace                - R ankle septic arthritis, R lower leg abscess s/p I&D             dressing changed by Dr. Ninfa Linden this am               See Dr. Trevor Mace note             Continue with PRAFO boot               IV Abx    - Osteomyelitis of sternum             CVTS  May need to consider repeat scan if fever persists and WBC count continues to trend up                - Pain management:             Per CCM  - Medical issues               Per CCM   - DVT/PE prophylaxis:             Heprain   - ID:               Vancomycin   - Dispo:            continue with current care             Monitor extremities  for other signs of ongoing infection               Labs do appear to be improving      [name], PA-C [facility] [phone] (O) 10/29/2015 9:35 AM

## 2015-10-29 NOTE — Progress Notes (Signed)
Physical Therapy Wound Treatment Patient Details  Name: Kevin Austin MRN: [id number] Date of Birth: Jun 27, 1990  Today's Date: 10/29/2015 Time: 4098-1191 Time Calculation (min): 44 min  Subjective  Subjective: Pt sedated during hydro session Patient and Family Stated Goals: None stated Date of Onset:  (Unknown) Prior Treatments: I&D 6/27  Pain Score:  No signs of pain.  Wound Assessment  Wound / Incision (Open or Dehisced) 10/25/15 Incision - Open Hand Right Dorsal aspect - Ulnar side (Active)  Dressing Type Compression wrap;Gauze (Comment);Moist to dry 10/29/2015  9:00 AM  Dressing Changed Changed 10/29/2015  9:00 AM  Dressing Status Clean;Dry;Intact 10/29/2015  9:00 AM  Dressing Change Frequency Daily 10/29/2015  9:00 AM  Site / Wound Assessment Red 10/29/2015  9:00 AM  % Wound base Red or Granulating 100% 10/29/2015  9:00 AM  % Wound base Yellow 0% 10/29/2015  9:00 AM  % Wound base Black 0% 10/29/2015  9:00 AM  % Wound base Other (Comment) 0% 10/29/2015  9:00 AM  Peri-wound Assessment Intact;Erythema (blanchable);Edema 10/29/2015  9:00 AM  Wound Length (cm) 6.5 cm 10/25/2015  9:37 AM  Wound Width (cm) 1.5 cm 10/25/2015  9:37 AM  Wound Depth (cm) 1.2 cm 10/25/2015  9:37 AM  Undermining (cm) 3-1 o'clock 2.9 cm, 7.-10 o'clock 4.2 cm (connecting), 11-12 o'clock 3.5 cm 10/25/2015  9:37 AM  Margins Unattached edges (unapproximated) 10/29/2015  9:00 AM  Closure None 10/29/2015  9:00 AM  Drainage Amount Minimal 10/29/2015  9:00 AM  Drainage Description Serosanguineous 10/29/2015  9:00 AM  Non-staged Wound Description Not applicable 08/02/8293  6:21 AM  Treatment Hydrotherapy (Pulse lavage);Packing (Impregnated strip) 10/29/2015  9:00 AM     Wound / Incision (Open or Dehisced) 10/25/15 Incision - Open Hand Right Dorsal aspect - Radial side (Active)  Dressing Type Compression wrap;Gauze (Comment);Moist to dry 10/29/2015  9:00 AM  Dressing Changed Changed 10/29/2015  9:00 AM  Dressing Status Clean;Dry;Intact 10/29/2015  9:00 AM   Dressing Change Frequency Daily 10/29/2015  9:00 AM  Site / Wound Assessment Red 10/29/2015  9:00 AM  % Wound base Red or Granulating 100% 10/29/2015  9:00 AM  % Wound base Yellow 0% 10/29/2015  9:00 AM  % Wound base Black 0% 10/29/2015  9:00 AM  % Wound base Other (Comment) 0% 10/29/2015  9:00 AM  Peri-wound Assessment Intact;Erythema (blanchable);Edema 10/29/2015  9:00 AM  Wound Length (cm) 5 cm 10/25/2015  9:37 AM  Wound Width (cm) 1 cm 10/25/2015  9:37 AM  Wound Depth (cm) 1.2 cm 10/25/2015  9:37 AM  Undermining (cm) 1-5 o'clock 4.5 cm (connecting), 9 o'clock 1.6 cm  10/25/2015  9:37 AM  Margins Unattached edges (unapproximated) 10/29/2015  9:00 AM  Closure None 10/29/2015  9:00 AM  Drainage Amount Minimal 10/29/2015  9:00 AM  Drainage Description Serosanguineous 10/29/2015  9:00 AM  Non-staged Wound Description Not applicable 3/0/8657  8:46 AM  Treatment Hydrotherapy (Pulse lavage);Packing (Impregnated strip) 10/29/2015  9:00 AM     Wound / Incision (Open or Dehisced) 10/25/15 Incision - Open Hand Left Dorsal aspect (Active)  Dressing Type Compression wrap;Gauze (Comment);Moist to dry 10/29/2015  9:00 AM  Dressing Changed Changed 10/29/2015  9:00 AM  Dressing Status Clean;Dry;Intact 10/29/2015  9:00 AM  Dressing Change Frequency Daily 10/29/2015  9:00 AM  Site / Wound Assessment Red 10/29/2015  9:00 AM  % Wound base Red or Granulating 100% 10/29/2015  9:00 AM  % Wound base Yellow 0% 10/29/2015  9:00 AM  % Wound base Black 0%  10/29/2015  9:00 AM  % Wound base Other (Comment) 0% 10/29/2015  9:00 AM  Peri-wound Assessment Erythema (blanchable);Intact 10/29/2015  9:00 AM  Wound Length (cm) 4 cm 10/25/2015  9:37 AM  Wound Width (cm) 0.8 cm 10/25/2015  9:37 AM  Wound Depth (cm) 1.5 cm 10/25/2015  9:37 AM  Undermining (cm) 2-4 o'clock 2.7 cm, 10 o'clock 2.1 cm 10/25/2015  9:37 AM  Margins Unattached edges (unapproximated) 10/29/2015  9:00 AM  Closure None 10/29/2015  9:00 AM  Drainage Amount Minimal 10/29/2015  9:00 AM  Drainage  Description Serosanguineous 10/29/2015  9:00 AM  Non-staged Wound Description Not applicable 06/02/9561  8:75 AM  Treatment Hydrotherapy (Pulse lavage);Packing (Impregnated strip) 10/29/2015  9:00 AM     Wound / Incision (Open or Dehisced) 10/25/15 Incision - Open Hand Left Palmar aspect (Active)  Dressing Type Compression wrap;Gauze (Comment);Moist to dry 10/29/2015  9:00 AM  Dressing Changed Changed 10/29/2015  9:00 AM  Dressing Status Clean;Dry;Intact 10/29/2015  9:00 AM  Dressing Change Frequency Daily 10/29/2015  9:00 AM  Site / Wound Assessment Red 10/29/2015  9:00 AM  % Wound base Red or Granulating 100% 10/29/2015  9:00 AM  % Wound base Yellow 0% 10/29/2015  9:00 AM  % Wound base Black 0% 10/29/2015  9:00 AM  % Wound base Other (Comment) 0% 10/29/2015  9:00 AM  Peri-wound Assessment Intact;Maceration 10/29/2015  9:00 AM  Wound Length (cm) 0.3 cm 10/25/2015  9:37 AM  Wound Width (cm) 4.1 cm 10/25/2015  9:37 AM  Wound Depth (cm) 1.8 cm 10/25/2015  9:37 AM  Undermining (cm) 7 o'clock 3.0 cm, 9 o'clock 2.2 cm, 1 o'clock .7 cm, 3-6 o'clock 1.2 cm 10/25/2015  9:37 AM  Margins Unattached edges (unapproximated) 10/29/2015  9:00 AM  Closure None 10/29/2015  9:00 AM  Drainage Amount Moderate 10/29/2015  9:00 AM  Drainage Description Serosanguineous 10/29/2015  9:00 AM  Non-staged Wound Description Not applicable 09/30/3327  5:18 AM  Treatment Hydrotherapy (Pulse lavage);Packing (Impregnated strip) 10/29/2015  9:00 AM      Hydrotherapy Pulsed lavage therapy - wound location: Bilateral hands Pulsed Lavage with Suction (psi): 4 psi Pulsed Lavage with Suction - Normal Saline Used: 500 mL (Between bilaeral hands) Pulsed Lavage Tip: Tunneling tip   Wound Assessment and Plan  Wound Therapy - Assess/Plan/Recommendations Wound Therapy - Clinical Statement: Wound beds remain clean. PROM to fingers and wrist. Wound Therapy - Functional Problem List: Decreased strength/AROM of hands/fingers for ADL's and fine motor tasks.  Factors  Delaying/Impairing Wound Healing: Substance abuse;Multiple medical problems Hydrotherapy Plan: Patient/family education;Pulsatile lavage with suction;Dressing change Wound Therapy - Frequency: 6X / week Wound Therapy - Follow Up Recommendations: Other (comment) (Per hand surgeon) Wound Plan: See above  Wound Therapy Goals- Improve the function of patient's integumentary system by progressing the wound(s) through the phases of wound healing (inflammation - proliferation - remodeling) by: Patient/Family will be able to : complete dressing changes independently prior to d/c Patient/Family Instruction Goal - Progress: Not progressing  Goals will be updated until maximal potential achieved or discharge criteria met.  Discharge criteria: when goals achieved, discharge from hospital, MD decision/surgical intervention, no progress towards goals, refusal/missing three consecutive treatments without notification or medical reason.  GP     Dashiel Bergquist 10/29/2015, 10:02 AM [place]

## 2015-10-30 DIAGNOSIS — M8619 Other acute osteomyelitis, multiple sites: Secondary | ICD-10-CM

## 2015-10-30 LAB — RENAL FUNCTION PANEL
ALBUMIN: 1.2 g/dL — AB (ref 3.5–5.0)
ANION GAP: 6 (ref 5–15)
BUN: 16 mg/dL (ref 6–20)
CALCIUM: 7.9 mg/dL — AB (ref 8.9–10.3)
CO2: 31 mmol/L (ref 22–32)
Chloride: 104 mmol/L (ref 101–111)
Creatinine, Ser: 0.53 mg/dL — ABNORMAL LOW (ref 0.61–1.24)
GFR calc Af Amer: >60 mL/min (ref >60)
GLUCOSE: 115 mg/dL — AB (ref 65–99)
PHOSPHORUS: 4.8 mg/dL — AB (ref 2.5–4.6)
POTASSIUM: 3.9 mmol/L (ref 3.5–5.1)
Sodium: 141 mmol/L (ref 135–145)

## 2015-10-30 LAB — GLUCOSE, CAPILLARY
GLUCOSE-CAPILLARY: 112 mg/dL — AB (ref 65–99)
GLUCOSE-CAPILLARY: 104 mg/dL — AB (ref 65–99)
GLUCOSE-CAPILLARY: 110 mg/dL — AB (ref 65–99)
Glucose-Capillary: 107 mg/dL — ABNORMAL HIGH (ref 65–99)
Glucose-Capillary: 109 mg/dL — ABNORMAL HIGH (ref 65–99)
Glucose-Capillary: 110 mg/dL — ABNORMAL HIGH (ref 65–99)
Glucose-Capillary: 98 mg/dL (ref 65–99)

## 2015-10-30 LAB — CBC WITH DIFFERENTIAL/PLATELET
BASOS PCT: 0 %
Basophils Absolute: 0 10*3/uL (ref 0.0–0.1)
EOS ABS: 0.1 10*3/uL (ref 0.0–0.7)
EOS PCT: 1 %
HEMATOCRIT: 24.1 % — AB (ref 39.0–52.0)
Hemoglobin: 7.6 g/dL — ABNORMAL LOW (ref 13.0–17.0)
LYMPHS ABS: 1.8 10*3/uL (ref 0.7–4.0)
Lymphocytes Relative: 13 %
MCH: 27 pg (ref 26.0–34.0)
MCHC: 31.5 g/dL (ref 30.0–36.0)
MCV: 85.5 fL (ref 78.0–100.0)
MONO ABS: 1.3 10*3/uL — AB (ref 0.1–1.0)
Monocytes Relative: 9 %
NEUTROS PCT: 77 %
Neutro Abs: 10.7 10*3/uL — ABNORMAL HIGH (ref 1.7–7.7)
PLATELETS: 387 10*3/uL (ref 150–400)
RBC: 2.82 MIL/uL — ABNORMAL LOW (ref 4.22–5.81)
RDW: 16 % — AB (ref 11.5–15.5)
WBC: 13.9 10*3/uL — ABNORMAL HIGH (ref 4.0–10.5)

## 2015-10-30 LAB — BODY FLUID CULTURE

## 2015-10-30 LAB — HEPATIC FUNCTION PANEL
ALT: 32 U/L (ref 17–63)
AST: 67 U/L — AB (ref 15–41)
Albumin: 1.2 g/dL — ABNORMAL LOW (ref 3.5–5.0)
Alkaline Phosphatase: 484 U/L — ABNORMAL HIGH (ref 38–126)
BILIRUBIN DIRECT: 1.5 mg/dL — AB (ref 0.1–0.5)
BILIRUBIN INDIRECT: 1.2 mg/dL — AB (ref 0.3–0.9)
BILIRUBIN TOTAL: 2.7 mg/dL — AB (ref 0.3–1.2)
Total Protein: 6.4 g/dL — ABNORMAL LOW (ref 6.5–8.1)

## 2015-10-30 LAB — TRIGLYCERIDES: TRIGLYCERIDES: 393 mg/dL — AB (ref <150)

## 2015-10-30 LAB — PHOSPHORUS: PHOSPHORUS: 4.6 mg/dL (ref 2.5–4.6)

## 2015-10-30 LAB — MAGNESIUM
MAGNESIUM: 1.9 mg/dL (ref 1.7–2.4)
Magnesium: 1.8 mg/dL (ref 1.7–2.4)
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
## 2015-10-30 NOTE — Progress Notes (Signed)
Physical Therapy Wound Treatment Patient Details  Name: Kevin Austin MRN: [id number] Date of Birth: 1990-06-30  Today's Date: 10/30/2015 Time: 2505-3976 Time Calculation (min): 40 min  Subjective  Subjective: Pt sedated during hydro session Patient and Family Stated Goals: None stated Date of Onset:  (Unknown) Prior Treatments: I&D 6/27  Pain Score:  Pt grimacing during treatment at times. Woke briefly x2.   Wound Assessment  Wound / Incision (Open or Dehisced) 10/25/15 Incision - Open Hand Right Dorsal aspect - Ulnar side (Active)  Dressing Type Compression wrap;Gauze (Comment);Moist to dry 10/30/2015 11:07 AM  Dressing Changed Changed 10/30/2015 11:07 AM  Dressing Status Clean;Dry;Intact 10/30/2015 11:07 AM  Dressing Change Frequency Daily 10/30/2015 11:07 AM  Site / Wound Assessment Red 10/30/2015 11:07 AM  % Wound base Red or Granulating 100% 10/30/2015 11:07 AM  % Wound base Yellow 0% 10/30/2015 11:07 AM  % Wound base Black 0% 10/30/2015 11:07 AM  % Wound base Other (Comment) 0% 10/30/2015 11:07 AM  Peri-wound Assessment Intact;Erythema (blanchable);Edema 10/30/2015 11:07 AM  Wound Length (cm) 6.5 cm 10/25/2015  9:37 AM  Wound Width (cm) 1.5 cm 10/25/2015  9:37 AM  Wound Depth (cm) 1.2 cm 10/25/2015  9:37 AM  Undermining (cm) 3-1 o'clock 2.9 cm, 7.-10 o'clock 4.2 cm (connecting), 11-12 o'clock 3.5 cm 10/25/2015  9:37 AM  Margins Unattached edges (unapproximated) 10/30/2015 11:07 AM  Closure None 10/30/2015 11:07 AM  Drainage Amount Minimal 10/30/2015 11:07 AM  Drainage Description Serosanguineous 10/30/2015 11:07 AM  Non-staged Wound Description Not applicable 10/29/4191 79:02 AM  Treatment Hydrotherapy (Pulse lavage);Packing (Impregnated strip) 10/30/2015 11:07 AM     Wound / Incision (Open or Dehisced) 10/25/15 Incision - Open Hand Right Dorsal aspect - Radial side (Active)  Dressing Type Compression wrap;Gauze (Comment);Moist to dry 10/30/2015 11:07 AM  Dressing Changed Changed 10/30/2015 11:07 AM  Dressing  Status Clean;Dry;Intact 10/30/2015 11:07 AM  Dressing Change Frequency Daily 10/30/2015 11:07 AM  Site / Wound Assessment Red 10/30/2015 11:07 AM  % Wound base Red or Granulating 100% 10/30/2015 11:07 AM  % Wound base Yellow 0% 10/30/2015 11:07 AM  % Wound base Black 0% 10/30/2015 11:07 AM  % Wound base Other (Comment) 0% 10/30/2015 11:07 AM  Peri-wound Assessment Intact;Erythema (blanchable);Edema 10/30/2015 11:07 AM  Wound Length (cm) 5 cm 10/25/2015  9:37 AM  Wound Width (cm) 1 cm 10/25/2015  9:37 AM  Wound Depth (cm) 1.2 cm 10/25/2015  9:37 AM  Undermining (cm) 1-5 o'clock 4.5 cm (connecting), 9 o'clock 1.6 cm  10/25/2015  9:37 AM  Margins Unattached edges (unapproximated) 10/30/2015 11:07 AM  Closure None 10/30/2015 11:07 AM  Drainage Amount Minimal 10/30/2015 11:07 AM  Drainage Description Serosanguineous 10/30/2015 11:07 AM  Non-staged Wound Description Not applicable 4/0/9735 32:99 AM  Treatment Hydrotherapy (Pulse lavage);Packing (Impregnated strip) 10/30/2015 11:07 AM     Wound / Incision (Open or Dehisced) 10/25/15 Incision - Open Hand Left Dorsal aspect (Active)  Dressing Type Compression wrap;Gauze (Comment);Moist to dry 10/30/2015 11:07 AM  Dressing Changed Changed 10/30/2015 11:07 AM  Dressing Status Clean;Dry;Intact 10/30/2015 11:07 AM  Dressing Change Frequency Daily 10/30/2015 11:07 AM  Site / Wound Assessment Red 10/30/2015 11:07 AM  % Wound base Red or Granulating 100% 10/30/2015 11:07 AM  % Wound base Yellow 0% 10/30/2015 11:07 AM  % Wound base Black 0% 10/30/2015 11:07 AM  % Wound base Other (Comment) 0% 10/30/2015 11:07 AM  Peri-wound Assessment Erythema (blanchable);Intact 10/30/2015 11:07 AM  Wound Length (cm) 4 cm 10/25/2015  9:37 AM  Wound  Width (cm) 0.8 cm 10/25/2015  9:37 AM  Wound Depth (cm) 1.5 cm 10/25/2015  9:37 AM  Undermining (cm) 2-4 o'clock 2.7 cm, 10 o'clock 2.1 cm 10/25/2015  9:37 AM  Margins Unattached edges (unapproximated) 10/30/2015 11:07 AM  Closure None 10/30/2015 11:07 AM  Drainage Amount  Minimal 10/30/2015 11:07 AM  Drainage Description Serosanguineous 10/30/2015 11:07 AM  Non-staged Wound Description Not applicable 11/04/1503 69:79 AM  Treatment Hydrotherapy (Pulse lavage);Packing (Impregnated strip) 10/30/2015 11:07 AM     Wound / Incision (Open or Dehisced) 10/25/15 Incision - Open Hand Left Palmar aspect (Active)  Dressing Type Compression wrap;Gauze (Comment);Moist to dry 10/30/2015 11:07 AM  Dressing Changed Changed 10/30/2015 11:07 AM  Dressing Status Clean;Dry;Intact 10/30/2015 11:07 AM  Dressing Change Frequency Daily 10/30/2015 11:07 AM  Site / Wound Assessment Red 10/30/2015 11:07 AM  % Wound base Red or Granulating 100% 10/30/2015 11:07 AM  % Wound base Yellow 0% 10/30/2015 11:07 AM  % Wound base Black 0% 10/30/2015 11:07 AM  % Wound base Other (Comment) 0% 10/30/2015 11:07 AM  Peri-wound Assessment Intact;Maceration 10/30/2015 11:07 AM  Wound Length (cm) 0.3 cm 10/25/2015  9:37 AM  Wound Width (cm) 4.1 cm 10/25/2015  9:37 AM  Wound Depth (cm) 1.8 cm 10/25/2015  9:37 AM  Undermining (cm) 7 o'clock 3.0 cm, 9 o'clock 2.2 cm, 1 o'clock .7 cm, 3-6 o'clock 1.2 cm 10/25/2015  9:37 AM  Margins Unattached edges (unapproximated) 10/30/2015 11:07 AM  Closure None 10/30/2015 11:07 AM  Drainage Amount Moderate 10/30/2015 11:07 AM  Drainage Description Serosanguineous 10/30/2015 11:07 AM  Non-staged Wound Description Not applicable 08/04/163 53:74 AM  Treatment Hydrotherapy (Pulse lavage);Packing (Impregnated strip) 10/30/2015 11:07 AM   Hydrotherapy Pulsed lavage therapy - wound location: Bilateral hands Pulsed Lavage with Suction (psi): 4 psi Pulsed Lavage with Suction - Normal Saline Used: 500 mL (Between bilaeral hands) Pulsed Lavage Tip: Tunneling tip   Wound Assessment and Plan  Wound Therapy - Assess/Plan/Recommendations Wound Therapy - Clinical Statement: Wound beds remain clean. PROM to fingers and wrist. Wound Therapy - Functional Problem List: Decreased strength/AROM of hands/fingers for ADL's  and fine motor tasks.  Factors Delaying/Impairing Wound Healing: Substance abuse;Multiple medical problems Hydrotherapy Plan: Patient/family education;Pulsatile lavage with suction;Dressing change Wound Therapy - Frequency: 6X / week Wound Therapy - Follow Up Recommendations: Other (comment) (Per hand surgeon) Wound Plan: See above  Wound Therapy Goals- Improve the function of patient's integumentary system by progressing the wound(s) through the phases of wound healing (inflammation - proliferation - remodeling) by: Patient/Family will be able to : complete dressing changes independently prior to d/c Patient/Family Instruction Goal - Progress: Not progressing Goals/treatment plan/discharge plan were made with and agreed upon by patient/family: No, Patient unable to participate in goals/treatment/discharge plan and family unavailable Time For Goal Achievement: 7 days Wound Therapy - Potential for Goals: Excellent  Goals will be updated until maximal potential achieved or discharge criteria met.  Discharge criteria: when goals achieved, discharge from hospital, MD decision/surgical intervention, no progress towards goals, refusal/missing three consecutive treatments without notification or medical reason.  GP     Rolinda Roan 10/30/2015, 11:12 AM   Rolinda Roan, PT, DPT Acute Rehabilitation Services Pager: [phone]

## 2015-10-30 NOTE — Progress Notes (Signed)
[place] for Infectious Disease    Date of Admission:  10/22/2015   Total days of antibiotics 9        Day 9 vancomycin        1 Day of zosyn and fortaz 6/26-6/27  Principal Problem:   MRSA bacteremia Active Problems:   Acute respiratory failure (HCC)   Abscess   Altered mental status   Encounter for central line placement   Bilateral pneumonia   Sternal osteomyelitis ([place])   . antiseptic oral rinse  7 mL Mouth Rinse QID  . chlorhexidine gluconate (SAGE KIT)  15 mL Mouth Rinse BID  . famotidine (PEPCID) IV  20 mg Intravenous Q12H  . feeding supplement (VITAL HIGH PROTEIN)  1,000 mL Per Tube Q24H  . fentaNYL (SUBLIMAZE) injection  50 mcg Intravenous Once  . heparin  5,000 Units Subcutaneous Q8H  . methadone  20 mg Per Tube Q8H  . sodium chloride  1,000 mL Intravenous Once  . vancomycin  1,500 mg Intravenous Q8H    SUBJECTIVE: The patient remains intubated and sedated, following basic commands.  Review of Systems: Review of Systems  Unable to perform ROS: intubated    History reviewed. No pertinent past medical history.  Social History  Substance Use Topics  . Smoking status: None  . Smokeless tobacco: None  . Alcohol Use: None    No family history on file. Allergies  Allergen Reactions  . Ketorolac Nausea Only    Per [facility] records  . Tramadol Nausea Only    Per [facility] records    OBJECTIVE: Filed Vitals:   10/30/15 0900 10/30/15 1000 10/30/15 1100 10/30/15 1155  BP: 140/87 148/94 152/102 152/102  Pulse: 108 114 116 112  Temp:      TempSrc:      Resp: '15 16 19 18  ' Height:      Weight:      SpO2: 100% 97% 95% 100%   Body mass index is 29.68 kg/(m^2).  Physical Exam  HENT:  Head: Normocephalic.  Eyes: Conjunctivae are normal.  Cardiovascular: Normal rate, regular rhythm and normal heart sounds.   Pulmonary/Chest:  Ventilator breath sounds, air movement bilaterally. Fullness with slight fluctuance over his upper sternum.    Abdominal: Soft.  Musculoskeletal:  2+ pitting edema of bilateral lower extremities. Ace wraps on bilateral hand and bilateral foot wounds.  Neurological:  Sedated  Skin: Skin is warm. Petechiae and rash noted. Rash is papular. He is not diaphoretic. No erythema.  Several excoriations    Lab Results Lab Results  Component Value Date   WBC 13.9* 10/30/2015   HGB 7.6* 10/30/2015   HCT 24.1* 10/30/2015   MCV 85.5 10/30/2015   PLT 387 10/30/2015    Lab Results  Component Value Date   CREATININE 0.53* 10/30/2015   BUN 16 10/30/2015   NA 141 10/30/2015   K 3.9 10/30/2015   CL 104 10/30/2015   CO2 31 10/30/2015    Lab Results  Component Value Date   ALT 32 10/30/2015   AST 67* 10/30/2015   ALKPHOS 484* 10/30/2015   BILITOT 2.7* 10/30/2015     Microbiology:  Collection Time: 10/26/15  3:07 PM  Result Value Ref Range Status   Specimen Description BLOOD RIGHT ANTECUBITAL  Final   Special Requests BOTTLES DRAWN AEROBIC ONLY 5CC  Final   Culture NO GROWTH 3 DAYS  Final   Report Status PENDING  Incomplete  Body fluid culture  Status: None (Preliminary result)   Collection Time: 10/27/15  1:57 PM  Result Value Ref Range Status   Specimen Description FLUID ANKLE  Final   Special Requests Immunocompromised  Final   Gram Stain   Final    ABUNDANT WBC PRESENT, PREDOMINANTLY PMN MODERATE GRAM POSITIVE COCCI IN CLUSTERS    Culture   Final    MODERATE METHICILLIN RESISTANT STAPHYLOCOCCUS AUREUS   Report Status PENDING  Incomplete   Organism ID, Bacteria METHICILLIN RESISTANT STAPHYLOCOCCUS AUREUS  Final      Susceptibility   Methicillin resistant staphylococcus aureus - MIC*    CIPROFLOXACIN <=0.5 SENSITIVE Sensitive     ERYTHROMYCIN >=8 RESISTANT Resistant     GENTAMICIN <=0.5 SENSITIVE Sensitive     OXACILLIN >=4 RESISTANT Resistant     TETRACYCLINE <=1 SENSITIVE Sensitive     VANCOMYCIN <=0.5 SENSITIVE Sensitive     TRIMETH/SULFA <=10 SENSITIVE Sensitive      CLINDAMYCIN <=0.25 SENSITIVE Sensitive     RIFAMPIN <=0.5 SENSITIVE Sensitive     Inducible Clindamycin NEGATIVE Sensitive     * MODERATE METHICILLIN RESISTANT STAPHYLOCOCCUS AUREUS    ASSESSMENT: Disseminated MRSA bacteremia: Bacteremia with extensive distribution into all four extremities, sternum, skin, pneumonia. He remains febrile despite appropriate antibiotic coverage with negative blood cultures greater than 4 days ago. Overall his clinical picture is about the same as yesterday. Source control may be problematic in the setting of his numerous abscesses and areas of osteomyelitis. Surgical services and wound care/PT are managing these extensive local complications of his disseminated infection.  PLAN: Continue vancomycin  Collier Salina, MD PGY-I Internal Medicine Resident Pager# [phone] 10/30/2015, 12:46 PM

## 2015-10-30 NOTE — Progress Notes (Signed)
3 Days Post-Op Procedure(s) (LRB): IRRIGATION AND DEBRIDEMENT EXTREMITY (Right) Subjective: reintubated yesterday morning sedated  Objective: Vital signs in last 24 hours: Temp:  [98.9 F (37.2 C)-100.9 F (38.3 C)] 99.4 F (37.4 C) (07/04 0734) Pulse Rate:  [99-135] 107 (07/04 0807) Cardiac Rhythm:  [-] Sinus tachycardia (07/04 0800) Resp:  [14-19] 15 (07/04 0807) BP: (103-172)/(53-99) 145/96 mmHg (07/04 0807) SpO2:  [92 %-100 %] 100 % (07/04 0807) FiO2 (%):  [30 %] 30 % (07/04 0807) Weight:  [212 lb 11.9 oz (96.5 kg)] 212 lb 11.9 oz (96.5 kg) (07/04 0406)  Hemodynamic parameters for last 24 hours:    Intake/Output from previous day: 07/03 0701 - 07/04 0700 In: 4776.5 [I.V.:1681.5; ZO/XW:9604G/GT:1495; IV Piggyback:1600] Out: 2975 [Urine:2975] Intake/Output this shift: Total I/O In: 148.1 [I.V.:73.1; NG/GT:75] Out: 260 [Urine:260]  some soft tissue swelling at sternomanubrial junction, no erythema or fluctuance  Lab Results:  Recent Labs  10/29/15 0425 10/30/15 0400  WBC 12.0* 13.9*  HGB 8.2* 7.6*  HCT 25.8* 24.1*  PLT 317 387   BMET:  Recent Labs  10/29/15 0425 10/30/15 0410  NA 146* 141  K 4.2 3.9  CL 96* 104  CO2 31 31  GLUCOSE 90 115*  BUN 19 16  CREATININE 0.58* 0.53*  CALCIUM 9.3 7.9*    PT/INR: No results for input(s): LABPROT, INR in the last 72 hours. ABG    Component Value Date/Time   PHART 7.446 10/29/2015 0823   HCO3 37.7* 10/29/2015 0823   TCO2 39 10/29/2015 0823   ACIDBASEDEF 1.0 10/22/2015 1832   O2SAT 100.0 10/29/2015 0823   CBG (last 3)   Recent Labs  10/30/15 0030 10/30/15 0415 10/30/15 0732  GLUCAP 110* 109* 104*    Assessment/Plan: S/P Procedure(s) (LRB): IRRIGATION AND DEBRIDEMENT EXTREMITY (Right) -  His fevers and WBC have been trending up. Multiple potential sources Consider repeat Ct chest if continues to spike   LOS: 8 days    Loreli SlotSteven C Jamariya Davidoff 10/30/2015

## 2015-10-30 NOTE — Progress Notes (Signed)
[facility] Progress Note  Subjective  Remains on vent and is sedated  Still spiking fevers even with cooling blanket Tmax: 101.2 at 0426 on 10/29/2015, 100.9 at 0032 this am   WBC count continues to trend up as well. Continued left shift  Remains tachy as well   Cultures from all body sites have grown out MRSA   Review of Systems  Unable to perform ROS: intubated     Objective   BP 145/96 mmHg  Pulse 107  Temp(Src) 99.4 F (37.4 C) (Rectal)  Resp 15  Ht '5\' 11"'  (1.803 m)  Wt 96.5 kg (212 lb 11.9 oz)  BMI 29.68 kg/m2  SpO2 100%  Intake/Output      07/03 0701 - 07/04 0700 07/04 0701 - 07/05 0700   I.V. (mL/kg) 1681.5 (17.4) 73.1 (0.8)   Other     NG/GT 1495 75   IV Piggyback 1600    Total Intake(mL/kg) 4776.5 (49.5) 148.1 (1.5)   Urine (mL/kg/hr) 2975 (1.3) 260 (0.9)   Total Output 2975 260   Net +1801.5 -111.9          Labs  Results for Kevin Austin, Kevin Austin (MRN [id number]) as of 10/30/2015 10:17  Ref. Range 10/30/2015 04:00 10/30/2015 04:10 10/30/2015 04:15  Sodium Latest Ref Range: 135-145 mmol/L  141   Potassium Latest Ref Range: 3.5-5.1 mmol/L  3.9   Chloride Latest Ref Range: 101-111 mmol/L  104   CO2 Latest Ref Range: 22-32 mmol/L  31   BUN Latest Ref Range: 6-20 mg/dL  16   Creatinine Latest Ref Range: 0.61-1.24 mg/dL  0.53 (L)   Calcium Latest Ref Range: 8.9-10.3 mg/dL  7.9 (L)   EGFR (Non-African Amer.) Latest Ref Range: >60 mL/min  >60   EGFR (African American) Latest Ref Range: >60 mL/min  >60   Glucose Latest Ref Range: 65-99 mg/dL  115 (H)   Anion gap Latest Ref Range: 5-15   6   Phosphorus Latest Ref Range: 2.5-4.6 mg/dL  4.8 (H)   Magnesium Latest Ref Range: 1.7-2.4 mg/dL 1.8    Alkaline Phosphatase Latest Ref Range: 38-126 U/L 484 (H)    Albumin Latest Ref Range: 3.5-5.0 g/dL 1.2 (L) 1.2 (L)   AST Latest Ref Range: 15-41 U/L 67 (H)    ALT Latest Ref Range: 17-63 U/L 32    Total Protein Latest Ref Range: 6.5-8.1 g/dL 6.4 (L)    Bilirubin,  Direct Latest Ref Range: 0.1-0.5 mg/dL 1.5 (H)    Indirect Bilirubin Latest Ref Range: 0.3-0.9 mg/dL 1.2 (H)    Total Bilirubin Latest Ref Range: 0.3-1.2 mg/dL 2.7 (H)    Triglycerides Latest Ref Range: <150 mg/dL 393 (H)    WBC Latest Ref Range: 4.0-10.5 K/uL 13.9 (H)    RBC Latest Ref Range: 4.22-5.81 MIL/uL 2.82 (L)    Hemoglobin Latest Ref Range: 13.0-17.0 g/dL 7.6 (L)    HCT Latest Ref Range: 39.0-52.0 % 24.1 (L)    MCV Latest Ref Range: 78.0-100.0 fL 85.5    MCH Latest Ref Range: 26.0-34.0 pg 27.0    MCHC Latest Ref Range: 30.0-36.0 g/dL 31.5    RDW Latest Ref Range: 11.5-15.5 % 16.0 (H)    Platelets Latest Ref Range: 150-400 K/uL 387    Neutrophils Latest Units: % 77    Lymphocytes Latest Units: % 13    Monocytes Relative Latest Units: % 9    Eosinophil Latest Units: % 1    Basophil Latest Units: % 0    NEUT# Latest Ref Range:  1.7-7.7 K/uL 10.7 (H)    Lymphocyte # Latest Ref Range: 0.7-4.0 K/uL 1.8    Monocyte # Latest Ref Range: 0.1-1.0 K/uL 1.3 (H)    Eosinophils Absolute Latest Ref Range: 0.0-0.7 K/uL 0.1    Basophils Absolute Latest Ref Range: 0.0-0.1 K/uL 0.0    RBC Morphology Unknown POLYCHROMASIA PRE...    WBC Morphology Unknown MODERATE LEFT SHI...      Exam  Gen: intubated and sedated  Abd: mild appreciable discomfort with deep palpation R quadrants Pelvis: some mild grimace with compression of pelvis  Ext:       B Lower Extremities                   L foot wound stable               Minimal swelling along L foot, scant drainage. No purulence              No erythema               Right ankle dressing changed: some increased erythema over dorsum of foot and lateral ankle. Some erythema noted medially    No active purulence form incision   Incisions look good    R hip ranged into flexion and abduction. This did not result in a response  L hip ranged into flexion and abduction. Some grimacing noted. This may have been a result of me grabbing his ankle  Soft  tissue around hips grossly stable               No appreciable pain response with axial loading or log rolling of hips              Thigh soft tissue unremarkable             Again lower extremities have multiple punctate scabs             No knee effusions noted, no erythema     Assessment and Plan   POD/HD#: 23  25 y/o white male, IVDU with multiple septic emboli    - Disseminated MRSA bacteremia               S/p multiple I&D's of extremities             Osteomyelitis of sternum              On IV abx             [facility] radiologist made addendum to CT chest/abd/pelvis that was done at [facility] on 10/22/2015, addendum done 10/24/2015 (have to look on PACS to see image). He agreed with infection being more probable at sternum. He also recommended that follow up imaging include MRI (preferably with Contrast) of the sternum and [place] joints   Would also strongly entertain CT abd/pelvis with contrast to eval for abscess (psoas and or iliacus muscles) as well as MRI of pelvis to eval for possible septic SI arthritis, as these are seen with IVDU. Due to the fact that pt is intubated clinical exam is limited to some degree    Considering pts fever persists and WBC count trending back up additional evaluation warranted    Pt will also likely need repeat I&D of R foot/ankle. Likely tomorrow    Would likely place resorbable abx beads at that time   Could also consider wound VAC   - L foot abscess s/p I&D  dressing changed             Wound stable             Continue with current dressing of 4x4's, kerlix and ace                - R ankle septic arthritis, R lower leg abscess s/p I&D             increasing erythema and swelling  Responding with manipulation of ankle and foot   Likely repeat I&D tomorrow  Pt posted but not sure what time and who surgeon will be   See above   - Osteomyelitis of sternum             CVTS             would strongly consider repeat imaging at this  time  Again [facility] radiologist recommended MRI with contrast if able   - Pain management:             Per CCM  - Medical issues               Per CCM   - DVT/PE prophylaxis:             [place]   - ID:               Vancomycin   - Dispo:            continue with current care             probable OR tomorrow for repeat I&D of R ankle +/- foot   [name], PA-C [facility] [phone] (P) [phone] (O) 10/30/2015 9:55 AM

## 2015-10-31 ENCOUNTER — Inpatient Hospital Stay (HOSPITAL_COMMUNITY): Payer: Medicaid Other

## 2015-10-31 DIAGNOSIS — J810 Acute pulmonary edema: Secondary | ICD-10-CM

## 2015-10-31 LAB — CULTURE, BLOOD (ROUTINE X 2)
CULTURE: NO GROWTH
CULTURE: NO GROWTH

## 2015-10-31 LAB — CBC WITH DIFFERENTIAL/PLATELET
BASOS PCT: 0 %
Basophils Absolute: 0 10*3/uL (ref 0.0–0.1)
EOS ABS: 0.1 10*3/uL (ref 0.0–0.7)
Eosinophils Relative: 1 %
HEMATOCRIT: 23.1 % — AB (ref 39.0–52.0)
HEMOGLOBIN: 7.2 g/dL — AB (ref 13.0–17.0)
LYMPHS PCT: 14 %
Lymphs Abs: 1.7 10*3/uL (ref 0.7–4.0)
MCH: 26.9 pg (ref 26.0–34.0)
MCHC: 31.2 g/dL (ref 30.0–36.0)
MCV: 86.2 fL (ref 78.0–100.0)
Monocytes Absolute: 1.1 10*3/uL — ABNORMAL HIGH (ref 0.1–1.0)
Monocytes Relative: 9 %
NEUTROS ABS: 9 10*3/uL — AB (ref 1.7–7.7)
Neutrophils Relative %: 76 %
Platelets: 353 10*3/uL (ref 150–400)
RBC: 2.68 MIL/uL — ABNORMAL LOW (ref 4.22–5.81)
RDW: 16.3 % — ABNORMAL HIGH (ref 11.5–15.5)
WBC: 11.9 10*3/uL — ABNORMAL HIGH (ref 4.0–10.5)

## 2015-10-31 LAB — TRIGLYCERIDES: TRIGLYCERIDES: 352 mg/dL — AB (ref <150)

## 2015-10-31 LAB — RENAL FUNCTION PANEL
ANION GAP: 8 (ref 5–15)
Albumin: 1.2 g/dL — ABNORMAL LOW (ref 3.5–5.0)
BUN: 10 mg/dL (ref 6–20)
CALCIUM: 8.3 mg/dL — AB (ref 8.9–10.3)
CO2: 31 mmol/L (ref 22–32)
Chloride: 102 mmol/L (ref 101–111)
Creatinine, Ser: 0.43 mg/dL — ABNORMAL LOW (ref 0.61–1.24)
GFR calc Af Amer: >60 mL/min (ref >60)
GFR calc non Af Amer: >60 mL/min (ref >60)
GLUCOSE: 101 mg/dL — AB (ref 65–99)
Phosphorus: 4.5 mg/dL (ref 2.5–4.6)
Potassium: 4 mmol/L (ref 3.5–5.1)
SODIUM: 141 mmol/L (ref 135–145)

## 2015-10-31 LAB — GLUCOSE, CAPILLARY
GLUCOSE-CAPILLARY: 91 mg/dL (ref 65–99)
GLUCOSE-CAPILLARY: 87 mg/dL (ref 65–99)
GLUCOSE-CAPILLARY: 104 mg/dL — AB (ref 65–99)
GLUCOSE-CAPILLARY: 87 mg/dL (ref 65–99)
Glucose-Capillary: 107 mg/dL — ABNORMAL HIGH (ref 65–99)
Glucose-Capillary: 91 mg/dL (ref 65–99)

## 2015-10-31 LAB — POTASSIUM: Potassium: 4.2 mmol/L (ref 3.5–5.1)

## 2015-10-31 LAB — PROTIME-INR
INR: 1.13 (ref 0.00–1.49)
PROTHROMBIN TIME: 14.7 s (ref 11.6–15.2)

## 2015-10-31 LAB — APTT: APTT: 34 s (ref 24–37)

## 2015-10-31 LAB — VANCOMYCIN, TROUGH: Vancomycin Tr: 34 ug/mL (ref 15–20)

## 2015-10-31 LAB — MAGNESIUM: Magnesium: 1.7 mg/dL (ref 1.7–2.4)

## 2015-10-31 MED ORDER — CEFAZOLIN SODIUM-DEXTROSE 2-4 GM/100ML-% IV SOLN
2.0000 g | INTRAVENOUS | Status: DC
  Filled 2015-10-31: qty 100

## 2015-10-31 MED ORDER — FUROSEMIDE 10 MG/ML IJ SOLN
20.0000 mg | Freq: Two times a day (BID) | INTRAMUSCULAR | Status: DC
  Administered 2015-10-31 – 2015-11-07 (×14): 20 mg via INTRAVENOUS
  Filled 2015-10-31 (×15): qty 2

## 2015-10-31 MED ORDER — IOPAMIDOL (ISOVUE-300) INJECTION 61%
INTRAVENOUS | Status: AC
  Administered 2015-10-31: 16:00:00
  Filled 2015-10-31: qty 100

## 2015-10-31 NOTE — Progress Notes (Signed)
Pharmacy Antibiotic Note  Kevin Austin is a 25 y.o. male admitted on 10/22/2015 with pneumonia and bacteremia.  Pharmacy has been consulted for vancomycin dosing.  Vancomycin trough = 34 --> supratherapeutic  Plan: Stop current vancomycin and hold next doses Vancomycin random level in AM Will closely watch renal fx and VT on this high dose    Height: 5\' 11"  (180.3 cm) Weight: 218 lb 4.1 oz (99 kg) IBW/kg (Calculated) : 75.3  Temp (24hrs), Avg:99.4 F (37.4 C), Min:98.9 F (37.2 C), Max:99.9 F (37.7 C)   Recent Labs Lab 10/27/15 0540  10/27/15 1847 10/28/15 0402 10/28/15 1420 10/29/15 0425 10/30/15 0400 10/30/15 0410 10/31/15 0353 10/31/15 1408  WBC 13.3*  --   --  10.2  --  12.0* 13.9*  --  11.9*  --   CREATININE 0.49*  0.49*  --  0.54* 0.65  --  0.58*  --  0.53* 0.43*  --   VANCOTROUGH  --   < >  --   --  17  --   --   --   --  34*  < > = values in this interval not displayed.  Estimated Creatinine Clearance: 172.3 mL/min (by C-G formula based on Cr of 0.43).    Allergies  Allergen Reactions  . [facility]ac Nausea Only    [facility]  . Tramadol Nausea Only    Per [facility]    Antimicrobials this admission: Vanc 6/26 >  Zosyn 6/26 >>6/27 Cefepime 6/26 >6/26 Ceftaz 6/27>>6/28  Dose adjustments this admission: 6/28 VT = on 1g q8h 6/30 VT = 14 on 1250 q8 7/1 VT = 20 on 1500 q8 7/2 VT = 17 on 1500 q8 7/5 VT = 34 on 1500 mg Q8 --> stop  Microbiology results: 6/26 BCx2: staph aureus 6/26 BCID: MRSA 6/26 UCx: sent 6/26 Sputum: staph aureus 6/26 MRSA PCR: pos Multiple Wounds/Abscess = MRSA  Thank you Okey RegalLisa Tysen Roesler, PharmD [phone] 10/31/2015 3:19 PM

## 2015-10-31 NOTE — Progress Notes (Signed)
Physical Therapy Wound Treatment Patient Details  Name: Kevin Austin MRN: [id number] Date of Birth: 1990/06/02  Today's Date: 10/31/2015 Time: 0814-0906 Time Calculation (min): 52 min  Subjective  Subjective: Pt sedated during hydro session Patient and Family Stated Goals: None stated Date of Onset:  (Unknown) Prior Treatments: I&D 6/27  Pain Score:  Pt grimacing and moving extremities throughout treatment. Continues to be sedated  Wound Assessment  Wound / Incision (Open or Dehisced) 10/25/15 Incision - Open Hand Right Dorsal aspect - Ulnar side (Active)  Dressing Type Compression wrap;Gauze (Comment);Moist to dry 10/31/2015  9:11 AM  Dressing Changed Changed 10/31/2015  9:11 AM  Dressing Status Clean;Dry;Intact 10/31/2015  9:11 AM  Dressing Change Frequency Daily 10/31/2015  9:11 AM  Site / Wound Assessment Red 10/31/2015  9:11 AM  % Wound base Red or Granulating 100% 10/31/2015  9:11 AM  % Wound base Yellow 0% 10/31/2015  9:11 AM  % Wound base Black 0% 10/31/2015  9:11 AM  % Wound base Other (Comment) 0% 10/31/2015  9:11 AM  Peri-wound Assessment Intact;Erythema (blanchable);Edema 10/31/2015  9:11 AM  Wound Length (cm) 6.5 cm 10/25/2015  9:37 AM  Wound Width (cm) 1.5 cm 10/25/2015  9:37 AM  Wound Depth (cm) 1.2 cm 10/25/2015  9:37 AM  Undermining (cm) 3-1 o'clock 2.9 cm, 7.-10 o'clock 4.2 cm (connecting), 11-12 o'clock 3.5 cm 10/25/2015  9:37 AM  Margins Unattached edges (unapproximated) 10/31/2015  9:11 AM  Closure None 10/31/2015  9:11 AM  Drainage Amount Minimal 10/31/2015  9:11 AM  Drainage Description Serosanguineous 10/31/2015  9:11 AM  Non-staged Wound Description Not applicable 05/31/4495  5:30 AM  Treatment Hydrotherapy (Pulse lavage);Packing (Impregnated strip) 10/31/2015  9:11 AM     Wound / Incision (Open or Dehisced) 10/25/15 Incision - Open Hand Right Dorsal aspect - Radial side (Active)  Dressing Type Compression wrap;Gauze (Comment);Moist to dry 10/31/2015  9:11 AM  Dressing Changed Changed 10/31/2015   9:11 AM  Dressing Status Clean;Dry;Intact 10/31/2015  9:11 AM  Dressing Change Frequency Daily 10/31/2015  9:11 AM  Site / Wound Assessment Red 10/31/2015  9:11 AM  % Wound base Red or Granulating 100% 10/31/2015  9:11 AM  % Wound base Yellow 0% 10/31/2015  9:11 AM  % Wound base Black 0% 10/31/2015  9:11 AM  % Wound base Other (Comment) 0% 10/31/2015  9:11 AM  Peri-wound Assessment Intact;Erythema (blanchable);Edema 10/31/2015  9:11 AM  Wound Length (cm) 5 cm 10/25/2015  9:37 AM  Wound Width (cm) 1 cm 10/25/2015  9:37 AM  Wound Depth (cm) 1.2 cm 10/25/2015  9:37 AM  Undermining (cm) 1-5 o'clock 4.5 cm (connecting), 9 o'clock 1.6 cm  10/25/2015  9:37 AM  Margins Unattached edges (unapproximated) 10/31/2015  9:11 AM  Closure None 10/31/2015  9:11 AM  Drainage Amount Minimal 10/31/2015  9:11 AM  Drainage Description Serosanguineous 10/31/2015  9:11 AM  Non-staged Wound Description Not applicable 0/08/1100  1:11 AM  Treatment Hydrotherapy (Pulse lavage);Packing (Impregnated strip) 10/31/2015  9:11 AM     Wound / Incision (Open or Dehisced) 10/25/15 Incision - Open Hand Left Dorsal aspect (Active)  Dressing Type Compression wrap;Gauze (Comment);Moist to dry 10/31/2015  9:11 AM  Dressing Changed Changed 10/31/2015  9:11 AM  Dressing Status Clean;Dry;Intact 10/31/2015  9:11 AM  Dressing Change Frequency Daily 10/31/2015  9:11 AM  Site / Wound Assessment Red 10/31/2015  9:11 AM  % Wound base Red or Granulating 100% 10/31/2015  9:11 AM  % Wound base Yellow 0% 10/31/2015  9:11  AM  % Wound base Black 0% 10/31/2015  9:11 AM  % Wound base Other (Comment) 0% 10/31/2015  9:11 AM  Peri-wound Assessment Erythema (blanchable);Intact 10/31/2015  9:11 AM  Wound Length (cm) 4 cm 10/25/2015  9:37 AM  Wound Width (cm) 0.8 cm 10/25/2015  9:37 AM  Wound Depth (cm) 1.5 cm 10/25/2015  9:37 AM  Undermining (cm) 2-4 o'clock 2.7 cm, 10 o'clock 2.1 cm 10/25/2015  9:37 AM  Margins Unattached edges (unapproximated) 10/31/2015  9:11 AM  Closure None 10/31/2015  9:11 AM   Drainage Amount Minimal 10/31/2015  9:11 AM  Drainage Description Serosanguineous 10/31/2015  9:11 AM  Non-staged Wound Description Not applicable 05/01/4816  5:63 AM  Treatment Hydrotherapy (Pulse lavage);Packing (Impregnated strip) 10/31/2015  9:11 AM     Wound / Incision (Open or Dehisced) 10/25/15 Incision - Open Hand Left Palmar aspect (Active)  Dressing Type Compression wrap;Gauze (Comment);Moist to dry 10/31/2015  9:11 AM  Dressing Changed Changed 10/31/2015  9:11 AM  Dressing Status Clean;Dry;Intact 10/31/2015  9:11 AM  Dressing Change Frequency Daily 10/31/2015  9:11 AM  Site / Wound Assessment Red 10/31/2015  9:11 AM  % Wound base Red or Granulating 100% 10/31/2015  9:11 AM  % Wound base Yellow 0% 10/31/2015  9:11 AM  % Wound base Black 0% 10/31/2015  9:11 AM  % Wound base Other (Comment) 0% 10/31/2015  9:11 AM  Peri-wound Assessment Intact;Maceration 10/31/2015  9:11 AM  Wound Length (cm) 0.3 cm 10/25/2015  9:37 AM  Wound Width (cm) 4.1 cm 10/25/2015  9:37 AM  Wound Depth (cm) 1.8 cm 10/25/2015  9:37 AM  Undermining (cm) 7 o'clock 3.0 cm, 9 o'clock 2.2 cm, 1 o'clock .7 cm, 3-6 o'clock 1.2 cm 10/25/2015  9:37 AM  Margins Unattached edges (unapproximated) 10/31/2015  9:11 AM  Closure None 10/31/2015  9:11 AM  Drainage Amount Moderate 10/31/2015  9:11 AM  Drainage Description Serosanguineous 10/31/2015  9:11 AM  Non-staged Wound Description Not applicable 05/01/9700  6:37 AM  Treatment Hydrotherapy (Pulse lavage);Packing (Impregnated strip) 10/31/2015  9:11 AM   Hydrotherapy Pulsed lavage therapy - wound location: Bilateral hands Pulsed Lavage with Suction (psi): 4 psi Pulsed Lavage with Suction - Normal Saline Used: 500 mL (Between bilaeral hands) Pulsed Lavage Tip: Tunneling tip   Wound Assessment and Plan  Wound Therapy - Assess/Plan/Recommendations Wound Therapy - Clinical Statement: Wound beds remain clean. PROM to fingers and wrist. Wound Therapy - Functional Problem List: Decreased strength/AROM of  hands/fingers for ADL's and fine motor tasks.  Factors Delaying/Impairing Wound Healing: Substance abuse;Multiple medical problems Hydrotherapy Plan: Patient/family education;Pulsatile lavage with suction;Dressing change Wound Therapy - Frequency: 6X / week Wound Therapy - Follow Up Recommendations: Other (comment) (Per hand surgeon) Wound Plan: See above  Wound Therapy Goals- Improve the function of patient's integumentary system by progressing the wound(s) through the phases of wound healing (inflammation - proliferation - remodeling) by: Patient/Family will be able to : complete dressing changes independently prior to d/c Patient/Family Instruction Goal - Progress: Not progressing Goals/treatment plan/discharge plan were made with and agreed upon by patient/family: No, Patient unable to participate in goals/treatment/discharge plan and family unavailable Time For Goal Achievement: 7 days Wound Therapy - Potential for Goals: Excellent  Goals will be updated until maximal potential achieved or discharge criteria met.  Discharge criteria: when goals achieved, discharge from hospital, MD decision/surgical intervention, no progress towards goals, refusal/missing three consecutive treatments without notification or medical reason.  GP     Rolinda Roan 10/31/2015, 9:31  AM  Rolinda Roan, PT, DPT Acute Rehabilitation Services Pager: [phone]

## 2015-10-31 NOTE — Progress Notes (Signed)
Subjective: 4 Days Post-Op Procedure(s) (LRB): IRRIGATION AND DEBRIDEMENT EXTREMITY (Right) Intubated.    Objective: Vital signs in last 24 hours: Temp:  [98.9 F (37.2 C)-99.9 F (37.7 C)] 98.9 F (37.2 C) (07/05 1213) Pulse Rate:  [101-124] 108 (07/05 1240) Resp:  [14-19] 15 (07/05 1240) BP: (132-176)/(76-111) 144/92 mmHg (07/05 1240) SpO2:  [94 %-100 %] 100 % (07/05 1240) FiO2 (%):  [30 %] 30 % (07/05 1240) Weight:  [99 kg (218 lb 4.1 oz)] 99 kg (218 lb 4.1 oz) (07/05 0121)  Intake/Output from previous day: 07/04 0701 - 07/05 0700 In: 5210.7 [I.V.:1720.7; NG/GT:1890; IV Piggyback:1600] Out: 2730 [Urine:2730] Intake/Output this shift: Total I/O In: 805 [I.V.:380; NG/GT:375; IV Piggyback:50] Out: 900 [Urine:900]   Recent Labs  10/29/15 0425 10/30/15 0400 10/31/15 0353  HGB 8.2* 7.6* 7.2*    Recent Labs  10/30/15 0400 10/31/15 0353  WBC 13.9* 11.9*  RBC 2.82* 2.68*  HCT 24.1* 23.1*  PLT 387 353    Recent Labs  10/30/15 0410 10/31/15 0353  NA 141 141  K 3.9 4.0  CL 104 102  CO2 31 31  BUN 16 10  CREATININE 0.53* 0.43*  GLUCOSE 115* 101*  CALCIUM 7.9* 8.3*    Recent Labs  10/31/15 1106  INR 1.13    no proximal erythema in arms.  edema of digits and forearm on right.    Assessment/Plan: 4 Days Post-Op Procedure(s) (LRB): IRRIGATION AND DEBRIDEMENT EXTREMITY (Right) Continue wound care.    Jarred Purtee R 10/31/2015, 1:12 PM

## 2015-10-31 NOTE — Progress Notes (Signed)
CRITICAL VALUE ALERT  Critical value received:  Vanc troph 34  Date of notification:  10/31/15  Time of notification:  1511  Critical value read back:Yes.    Nurse who received alert:  Lorin PicketLindsey Bairon Klemann  MD notified (1st page):  Sommer  Time of first page:  1512  MD notified (2nd page):  Time of second page:  Responding MD:  Arsenio LoaderSommer  Time MD responded:  [phone]

## 2015-10-31 NOTE — Progress Notes (Signed)
[facility] for Infectious Disease    Date of Admission:  10/22/2015   Total days of antibiotics 10        Day 10 vancomycin  Principal Problem:   MRSA bacteremia Active Problems:   Acute respiratory failure (HCC)   Abscess   Altered mental status   Encounter for central line placement   Bilateral pneumonia   Sternal osteomyelitis ([place])   Acute respiratory failure with hypercapnia (HCC)   HCAP (healthcare-associated pneumonia)   Acute pulmonary edema ([place])   . antiseptic oral rinse  7 mL Mouth Rinse QID  . chlorhexidine gluconate (SAGE KIT)  15 mL Mouth Rinse BID  . famotidine (PEPCID) IV  20 mg Intravenous Q12H  . feeding supplement (VITAL HIGH PROTEIN)  1,000 mL Per Tube Q24H  . furosemide  20 mg Intravenous Q12H  . heparin  5,000 Units Subcutaneous Q8H  . methadone  20 mg Per Tube Q8H  . sodium chloride  1,000 mL Intravenous Once  . vancomycin  1,500 mg Intravenous Q8H    SUBJECTIVE: He is intubated and sedated, but will follow basic commands.  Review of Systems: Review of Systems  Unable to perform ROS: intubated    History reviewed. No pertinent past medical history.  Social History  Substance Use Topics  . Smoking status: None  . Smokeless tobacco: None  . Alcohol Use: None    No family history on file. Allergies  Allergen Reactions  . Ketorolac Nausea Only    Per [facility]  . Tramadol Nausea Only    Per [facility]    OBJECTIVE: Filed Vitals:   10/31/15 1200 10/31/15 1213 10/31/15 1240 10/31/15 1300  BP: 144/92  144/92 140/88  Pulse: 103  108 107  Temp:  98.9 F (37.2 C)    TempSrc:  Oral    Resp: _0 Height:      Weight:      SpO2: 100%  100% 100%   Body mass index is 30.45 kg/(m^2).  Physical Exam  Eyes: Conjunctivae are normal.  Cardiovascular: Normal rate and regular rhythm.   Pulmonary/Chest:  Ventilator assisted breaths, crackles present bilaterally, fluctuance present over upper sternum    Musculoskeletal:  2+ pitting edema of lower extremities below the knees b/l  Skin:  Diffuse folliculitis on chest, arms, legs, some excoriations present    Lab Results Lab Results  Component Value Date   WBC 11.9* 10/31/2015   HGB 7.2* 10/31/2015   HCT 23.1* 10/31/2015   MCV 86.2 10/31/2015   PLT 353 10/31/2015    Lab Results  Component Value Date   CREATININE 0.43* 10/31/2015   BUN 10 10/31/2015   NA 141 10/31/2015   K 4.0 10/31/2015   CL 102 10/31/2015   CO2 31 10/31/2015    Lab Results  Component Value Date   ALT 32 10/30/2015   AST 67* 10/30/2015   ALKPHOS 484* 10/30/2015   BILITOT 2.7* 10/30/2015     Microbiology: Culture, blood (routine x 2)     Status: None (Preliminary result)   Collection Time: 10/26/15  3:00 PM  Result Value Ref Range Status   Specimen Description BLOOD RIGHT ANTECUBITAL  Final   Special Requests BOTTLES DRAWN AEROBIC ONLY 5CC  Final   Culture NO GROWTH 4 DAYS  Final   Report Status PENDING  Incomplete  Culture, blood (routine x 2)     Status: None (Preliminary result)   Collection Time: 10/26/15  3:07 PM  Result Value Ref Range Status   Specimen Description BLOOD RIGHT ANTECUBITAL  Final   Special Requests BOTTLES DRAWN AEROBIC ONLY 5CC  Final   Culture NO GROWTH 4 DAYS  Final   Report Status PENDING  Incomplete  Body fluid culture     Status: None   Collection Time: 10/27/15  1:57 PM  Result Value Ref Range Status   Specimen Description FLUID ANKLE  Final   Special Requests Immunocompromised  Final   Gram Stain   Final    ABUNDANT WBC PRESENT, PREDOMINANTLY PMN MODERATE GRAM POSITIVE COCCI IN CLUSTERS    Culture   Final    MODERATE METHICILLIN RESISTANT STAPHYLOCOCCUS AUREUS   Report Status 10/30/2015 FINAL  Final   Organism ID, Bacteria METHICILLIN RESISTANT STAPHYLOCOCCUS AUREUS  Final      Susceptibility   Methicillin resistant staphylococcus aureus - MIC*    CIPROFLOXACIN <=0.5 SENSITIVE Sensitive     ERYTHROMYCIN >=8  RESISTANT Resistant     GENTAMICIN <=0.5 SENSITIVE Sensitive     OXACILLIN >=4 RESISTANT Resistant     TETRACYCLINE <=1 SENSITIVE Sensitive     VANCOMYCIN <=0.5 SENSITIVE Sensitive     TRIMETH/SULFA <=10 SENSITIVE Sensitive     CLINDAMYCIN <=0.25 SENSITIVE Sensitive     RIFAMPIN <=0.5 SENSITIVE Sensitive     Inducible Clindamycin NEGATIVE Sensitive     * MODERATE METHICILLIN RESISTANT STAPHYLOCOCCUS AUREUS    ASSESSMENT: Disseminated MRSA bacteremia: Mr. Reale is slowly responding to treatment for MRSA bacteremia complicated by numerous sites of abscess, septic arthritis, and possibly osteomyelitis. His recorded fevers have decreased but this is difficult to interpret due to use of a cooling blanket. He has undergone debridement of extremities with ongoing hydrotherapy, and surgical services continue to interventional management of localized complications with plans for additional imaging studies to look for abdominal abscess and sternum osteomyelitis. As an infectious source his central line should be okay with negative blood cultures from 6/28, although with a positive BCID result of unknown significance. He is appropriately treated by vancomycin but certainly going to require prolonged treatment.  PLAN: 1. Continue vancomycin  Collier Salina, MD PGY-II Internal Medicine Resident Pager# [phone] 10/31/2015, 1:17 PM
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
## 2015-10-31 NOTE — Progress Notes (Signed)
   Assessment: 5 Days Post-Op  S/P Procedure(s) (LRB): IRRIGATION AND DEBRIDEMENT FOOT (Left) by Dr. Timothy D. Murphy on 10/24/15  Principal Problem:   MRSA bacteremia Active Problems:   Acute respiratory failure (HCC)   Abscess   Altered mental status   Encounter for central line placement   Bilateral pneumonia   Sternal osteomyelitis (HCC)   Acute respiratory failure with hypercapnia (HCC)   HCAP (healthcare-associated pneumonia)  Disseminated MRSA bacteremia   WBC stable, slightly elevated, w/ mild Left shift.  Needing cooling blanket for Fevers.  Left Foot.  Surgical site/wound looks good.  Loose suture in place - center of would open to drain.  Scant serous drainage.  Left ankle palpation and passive ROM does not illicit pain response.  Non-erythematous. Left knee and thigh without swelling/erythema.   S/p multiple I&D's of extremities.    Osteomyelitis of sternum per CVTS  Right ankle: I/D by Dr. Blackman 7/1.  Increasing erythema/swelling and pain w/ ROM.   Plan: Continue Abx and plan per CCM. Left Foot: Loose suture in place to allow drainage.  Continue pressure gauze dressing / kerlix / ace wrap.  Continue to follow extremities for new/worsening infection.    Repeat Right foot / ankle I/D likely.  Surgeon/time pending.   Weight Bearing: Weight Bearing as Tolerated (WBAT) left foot Dressings: Left foot Gauze / Kerlix / Ace wrap Daily.  Sutures to be removed in ~10-14 days post op. VTE prophylaxis: on sq heparin Dispo: per primary  Subjective: Intubated, sedated.   Objective:   VITALS:   Filed Vitals:   10/31/15 0419 10/31/15 0500 10/31/15 0600 10/31/15 0700  BP: 161/103 153/101 167/102 151/96  Pulse: 118 118 124 108  Temp:      TempSrc:      Resp: 19 15 16 15  Height:      Weight:      SpO2: 98% 94% 95% 98%    Lab Results  Component Value Date   WBC 11.9* 10/31/2015   HGB 7.2* 10/31/2015   HCT 23.1* 10/31/2015   MCV 86.2 10/31/2015   PLT  353 10/31/2015    Physical Exam General: NAD.  Intubated, sedated  MSK:  -Left Foot:  Intact pulses distally.  Foot warm.   Surgical site/wound looks good.  Loose suture in place - center of would open to drain.  Scant serous drainage.  Left ankle palpation and passive ROM does not illicit pain response.  Non-erythematous.  -Left knee and thigh without swelling/erythema. -Right foot/ankle wrapped with increased erythema, swelling.  Pain w/ manipulation/ROM.   Future Yeldell Calvin Martensen III 10/31/2015, 7:41 AM  

## 2015-11-01 ENCOUNTER — Encounter (HOSPITAL_COMMUNITY)
Admission: AD | Disposition: A | Discharge status: Home Health | Payer: Self-pay | Source: Other Acute Inpatient Hospital | Attending: Pulmonary Disease

## 2015-11-01 ENCOUNTER — Inpatient Hospital Stay (HOSPITAL_COMMUNITY): Payer: Medicaid Other | Admitting: Certified Registered Nurse Anesthetist

## 2015-11-01 ENCOUNTER — Inpatient Hospital Stay (HOSPITAL_COMMUNITY): Payer: Medicaid Other

## 2015-11-01 ENCOUNTER — Encounter (HOSPITAL_COMMUNITY): Payer: Self-pay | Admitting: Certified Registered"

## 2015-11-01 DIAGNOSIS — M8619 Other acute osteomyelitis, multiple sites: Secondary | ICD-10-CM

## 2015-11-01 DIAGNOSIS — D6489 Other specified anemias: Secondary | ICD-10-CM

## 2015-11-01 DIAGNOSIS — R740 Nonspecific elevation of levels of transaminase and lactic acid dehydrogenase [LDH]: Secondary | ICD-10-CM

## 2015-11-01 HISTORY — PX: I & D EXTREMITY: SHX5045

## 2015-11-01 LAB — CBC
HCT: 23.3 % — ABNORMAL LOW (ref 39.0–52.0)
HEMATOCRIT: 20.9 % — AB (ref 39.0–52.0)
HEMOGLOBIN: 6.4 g/dL — AB (ref 13.0–17.0)
Hemoglobin: 7.4 g/dL — ABNORMAL LOW (ref 13.0–17.0)
MCH: 26.7 pg (ref 26.0–34.0)
MCH: 27.3 pg (ref 26.0–34.0)
MCHC: 30.6 g/dL (ref 30.0–36.0)
MCHC: 31.8 g/dL (ref 30.0–36.0)
MCV: 87.1 fL (ref 78.0–100.0)
MCV: 86 fL (ref 78.0–100.0)
Platelets: 325 10*3/uL (ref 150–400)
Platelets: 315 10*3/uL (ref 150–400)
RBC: 2.4 MIL/uL — AB (ref 4.22–5.81)
RBC: 2.71 MIL/uL — ABNORMAL LOW (ref 4.22–5.81)
RDW: 16 % — ABNORMAL HIGH (ref 11.5–15.5)
RDW: 15.3 % (ref 11.5–15.5)
WBC: 11.2 10*3/uL — AB (ref 4.0–10.5)
WBC: 11.9 10*3/uL — ABNORMAL HIGH (ref 4.0–10.5)

## 2015-11-01 LAB — GLUCOSE, CAPILLARY
GLUCOSE-CAPILLARY: 103 mg/dL — AB (ref 65–99)
Glucose-Capillary: 83 mg/dL (ref 65–99)
Glucose-Capillary: 86 mg/dL (ref 65–99)
Glucose-Capillary: 106 mg/dL — ABNORMAL HIGH (ref 65–99)
Glucose-Capillary: 89 mg/dL (ref 65–99)

## 2015-11-01 LAB — VANCOMYCIN, RANDOM: Vancomycin Rm: 10

## 2015-11-01 LAB — RENAL FUNCTION PANEL
Albumin: 1.2 g/dL — ABNORMAL LOW (ref 3.5–5.0)
Anion gap: 7 (ref 5–15)
BUN: 9 mg/dL (ref 6–20)
CHLORIDE: 98 mmol/L — AB (ref 101–111)
CO2: 32 mmol/L (ref 22–32)
Calcium: 8.1 mg/dL — ABNORMAL LOW (ref 8.9–10.3)
Creatinine, Ser: 0.5 mg/dL — ABNORMAL LOW (ref 0.61–1.24)
GFR calc Af Amer: >60 mL/min (ref >60)
GFR calc non Af Amer: >60 mL/min (ref >60)
GLUCOSE: 85 mg/dL (ref 65–99)
POTASSIUM: 3.6 mmol/L (ref 3.5–5.1)
Phosphorus: 5 mg/dL — ABNORMAL HIGH (ref 2.5–4.6)
Sodium: 137 mmol/L (ref 135–145)

## 2015-11-01 LAB — MAGNESIUM: Magnesium: 1.6 mg/dL — ABNORMAL LOW (ref 1.7–2.4)

## 2015-11-01 LAB — PREPARE RBC (CROSSMATCH)

## 2015-11-01 LAB — ABO/RH: ABO/RH(D): A NEG

## 2015-11-01 LAB — TRIGLYCERIDES: Triglycerides: 321 mg/dL — ABNORMAL HIGH (ref <150)

## 2015-11-01 SURGERY — IRRIGATION AND DEBRIDEMENT EXTREMITY
Anesthesia: General | Site: Chest | Laterality: Right

## 2015-11-01 MED ORDER — FENTANYL CITRATE (PF) 250 MCG/5ML IJ SOLN
INTRAMUSCULAR | Status: AC
  Filled 2015-11-01: qty 5

## 2015-11-01 MED ORDER — LACTATED RINGERS IV SOLN
INTRAVENOUS | Status: DC | PRN
  Administered 2015-11-01: 16:00:00 via INTRAVENOUS

## 2015-11-01 MED ORDER — ROCURONIUM BROMIDE 100 MG/10ML IV SOLN
INTRAVENOUS | Status: DC | PRN
  Administered 2015-11-01: 20 mg via INTRAVENOUS
  Administered 2015-11-01: 50 mg via INTRAVENOUS

## 2015-11-01 MED ORDER — SODIUM CHLORIDE 0.9 % IV SOLN
Freq: Once | INTRAVENOUS | Status: AC
  Administered 2015-11-01: 06:00:00 via INTRAVENOUS

## 2015-11-01 MED ORDER — VANCOMYCIN HCL 1000 MG IV SOLR
1000.0000 mg | INTRAVENOUS | Status: DC
  Filled 2015-11-01: qty 1000

## 2015-11-01 MED ORDER — VECURONIUM BROMIDE 10 MG IV SOLR
10.0000 mg | Freq: Once | INTRAVENOUS | Status: AC
  Administered 2015-11-01: 10 mg via INTRAVENOUS

## 2015-11-01 MED ORDER — SODIUM CHLORIDE 0.9 % IV SOLN
Freq: Once | INTRAVENOUS | Status: AC
  Administered 2015-11-01: 50 mL via INTRAVENOUS

## 2015-11-01 MED ORDER — SODIUM CHLORIDE 0.9 % IR SOLN
Status: DC | PRN
  Administered 2015-11-01 (×3): 3000 mL

## 2015-11-01 MED ORDER — FENTANYL CITRATE (PF) 100 MCG/2ML IJ SOLN
INTRAMUSCULAR | Status: DC | PRN
  Administered 2015-11-01: 100 ug via INTRAVENOUS
  Administered 2015-11-01: 150 ug via INTRAVENOUS

## 2015-11-01 MED ORDER — MIDAZOLAM HCL 5 MG/5ML IJ SOLN
INTRAMUSCULAR | Status: DC | PRN
  Administered 2015-11-01: 2 mg via INTRAVENOUS

## 2015-11-01 MED ORDER — DEXTROSE 5 % IV SOLN
3.0000 g | Freq: Once | INTRAVENOUS | Status: AC
  Administered 2015-11-01: 3 g via INTRAVENOUS
  Filled 2015-11-01: qty 6

## 2015-11-01 MED ORDER — TOBRAMYCIN SULFATE 1.2 G IJ SOLR
1.2000 g | INTRAMUSCULAR | Status: DC
  Filled 2015-11-01: qty 1.2

## 2015-11-01 MED ORDER — SODIUM CHLORIDE 0.9 % IV SOLN
1500.0000 mg | Freq: Two times a day (BID) | INTRAVENOUS | Status: DC
  Administered 2015-11-01 – 2015-11-03 (×5): 1500 mg via INTRAVENOUS
  Filled 2015-11-01 (×7): qty 1500

## 2015-11-01 MED ORDER — MIDAZOLAM HCL 2 MG/2ML IJ SOLN
INTRAMUSCULAR | Status: AC
  Filled 2015-11-01: qty 2

## 2015-11-01 SURGICAL SUPPLY — 63 items
BANDAGE ELASTIC 4 VELCRO ST LF (GAUZE/BANDAGES/DRESSINGS) ×4 IMPLANT
BANDAGE ELASTIC 6 VELCRO ST LF (GAUZE/BANDAGES/DRESSINGS) ×2 IMPLANT
BLADE SURG 10 STRL SS (BLADE) ×4 IMPLANT
BNDG COHESIVE 4X5 TAN STRL (GAUZE/BANDAGES/DRESSINGS) ×4 IMPLANT
BNDG GAUZE ELAST 4 BULKY (GAUZE/BANDAGES/DRESSINGS) ×6 IMPLANT
CANISTER SUCTION 2500CC (MISCELLANEOUS) ×4 IMPLANT
COVER SURGICAL LIGHT HANDLE (MISCELLANEOUS) ×12 IMPLANT
CUFF TOURNIQUET SINGLE 34IN LL (TOURNIQUET CUFF) ×2 IMPLANT
DRAPE ORTHO SPLIT 77X108 STRL (DRAPES) ×4
DRAPE PROXIMA HALF (DRAPES) ×2 IMPLANT
DRAPE SURG ORHT 6 SPLT 77X108 (DRAPES) IMPLANT
DRAPE U-SHAPE 47X51 STRL (DRAPES) ×2 IMPLANT
DRSG PAD ABDOMINAL 8X10 ST (GAUZE/BANDAGES/DRESSINGS) ×6 IMPLANT
DURAPREP 26ML APPLICATOR (WOUND CARE) ×4 IMPLANT
ELECT REM PT RETURN 9FT ADLT (ELECTROSURGICAL) ×4
ELECTRODE REM PT RTRN 9FT ADLT (ELECTROSURGICAL) ×2 IMPLANT
EVACUATOR 1/8 PVC DRAIN (DRAIN) IMPLANT
FACESHIELD WRAPAROUND (MASK) ×12 IMPLANT
FACESHIELD WRAPAROUND OR TEAM (MASK) ×6 IMPLANT
GAUZE SPONGE 4X4 12PLY STRL (GAUZE/BANDAGES/DRESSINGS) ×12 IMPLANT
GAUZE XEROFORM 1X8 LF (GAUZE/BANDAGES/DRESSINGS) ×4 IMPLANT
GLOVE BIO SURGEON STRL SZ7 (GLOVE) ×4 IMPLANT
GLOVE BIO SURGEON STRL SZ7.5 (GLOVE) ×4 IMPLANT
GLOVE BIOGEL PI IND STRL 6.5 (GLOVE) IMPLANT
GLOVE BIOGEL PI IND STRL 7.0 (GLOVE) ×2 IMPLANT
GLOVE BIOGEL PI INDICATOR 6.5 (GLOVE) ×2
GLOVE BIOGEL PI INDICATOR 7.0 (GLOVE) ×2
GLOVE ECLIPSE 6.0 STRL STRAW (GLOVE) ×2 IMPLANT
GLOVE EUDERMIC 7 POWDERFREE (GLOVE) ×4 IMPLANT
GLOVE SURG SS PI 6.5 STRL IVOR (GLOVE) ×2 IMPLANT
GOWN STRL REUS W/ TWL LRG LVL3 (GOWN DISPOSABLE) ×8 IMPLANT
GOWN STRL REUS W/ TWL XL LVL3 (GOWN DISPOSABLE) ×2 IMPLANT
GOWN STRL REUS W/TWL LRG LVL3 (GOWN DISPOSABLE) ×20
GOWN STRL REUS W/TWL XL LVL3 (GOWN DISPOSABLE) ×4
HANDPIECE INTERPULSE COAX TIP (DISPOSABLE) ×4
KIT BASIN OR (CUSTOM PROCEDURE TRAY) ×8 IMPLANT
KIT ROOM TURNOVER OR (KITS) ×8 IMPLANT
MANIFOLD NEPTUNE II (INSTRUMENTS) ×4 IMPLANT
NS IRRIG 1000ML POUR BTL (IV SOLUTION) ×8 IMPLANT
PACK GENERAL/GYN (CUSTOM PROCEDURE TRAY) ×4 IMPLANT
PACK ORTHO EXTREMITY (CUSTOM PROCEDURE TRAY) ×4 IMPLANT
PACK UNIVERSAL I (CUSTOM PROCEDURE TRAY) ×4 IMPLANT
PAD ARMBOARD 7.5X6 YLW CONV (MISCELLANEOUS) ×16 IMPLANT
PENCIL BUTTON HOLSTER BLD 10FT (ELECTRODE) IMPLANT
SET HNDPC FAN SPRY TIP SCT (DISPOSABLE) IMPLANT
SET IRRIG Y TYPE TUR BLADDER L (SET/KITS/TRAYS/PACK) ×2 IMPLANT
SPONGE LAP 18X18 X RAY DECT (DISPOSABLE) ×4 IMPLANT
STAPLER VISISTAT 35W (STAPLE) IMPLANT
STOCKINETTE IMPERVIOUS 9X36 MD (GAUZE/BANDAGES/DRESSINGS) ×4 IMPLANT
SUT ETHILON 3 0 FSL (SUTURE) ×2 IMPLANT
SUT ETHILON 3 0 PS 1 (SUTURE) ×6 IMPLANT
SUT VIC AB 2-0 CTX 36 (SUTURE) IMPLANT
SUT VIC AB 3-0 SH 27 (SUTURE)
SUT VIC AB 3-0 SH 27X BRD (SUTURE) IMPLANT
SUT VIC AB 3-0 X1 27 (SUTURE) ×2 IMPLANT
TOWEL OR 17X24 6PK STRL BLUE (TOWEL DISPOSABLE) ×6 IMPLANT
TOWEL OR 17X26 10 PK STRL BLUE (TOWEL DISPOSABLE) ×4 IMPLANT
TOWEL OR NON WOVEN STRL DISP B (DISPOSABLE) ×4 IMPLANT
TUBE CONNECTING 12'X1/4 (SUCTIONS) ×1
TUBE CONNECTING 12X1/4 (SUCTIONS) ×3 IMPLANT
UNDERPAD 30X30 INCONTINENT (UNDERPADS AND DIAPERS) ×4 IMPLANT
WATER STERILE IRR 1000ML POUR (IV SOLUTION) ×8 IMPLANT
YANKAUER SUCT BULB TIP NO VENT (SUCTIONS) ×4 IMPLANT

## 2015-11-01 NOTE — Progress Notes (Signed)
eLink Physician-Brief Progress Note Patient Name: Kevin Austin DOB: [birth year] MRN: [id number]   Date of Service  11/01/2015  HPI/Events of Note  Hb 6.4  eICU Interventions  1 u pRBC     Intervention Category Intermediate Interventions: Bleeding - evaluation and treatment with blood products  Shaka Zech V. 11/01/2015, 4:42 AM

## 2015-11-01 NOTE — Procedures (Signed)
PCCM Video Bronchoscopy Procedure Note  The patient was informed of the risks (including but not limited to bleeding, infection, respiratory failure, lung injury, tooth/oral injury) and benefits of the procedure and gave consent, see chart.  Indication: persistent respiratory failure, need for imaging during percutaneous tracheostomy   Post Procedure Diagnosis: Same  Location: 2M13  Condition pre procedure: critically ill, on vent  Medications for procedure: Propofol drip, Fentanyl drip, vecuronium, versed 4mg  IV  Procedure description: The bronchoscope was introduced through the endotracheal tube and passed to the bilateral lungs to the level of the subsegmental bronchi throughout the tracheobronchial tree.  Airway exam revealed thick dried mucus at the end of the endotracheal tube nearly occluding the airway.  This was suctioned successfully.  The scope was then used to provide visualization for the tracheostomy placement.  Procedures performed: therapeutic aspiration of the airways  Specimens sent: None  Condition post procedure: critically ill, on vent  EBL: none  Complications: none  Kevin CarolinaBrent McQuaid, MD [place] [facility] Pager: [phone] Cell: [phone] After 3pm or if no response, call [phone]

## 2015-11-01 NOTE — Brief Op Note (Addendum)
10/22/2015 - 11/01/2015  5:43 PM  PATIENT:  Kevin Austin  25 y.o. male  PRE-OPERATIVE DIAGNOSIS:  Sternal abscess  POST-OPERATIVE DIAGNOSIS:  Sternal abscess  PROCEDURE:  IRRIGATION AND DEBRIDEMENT OF STERNAL ABSCESS  SURGEON:  Surgeon(s) and Role:     * Loreli SlotSteven C Nolita Kutter, MD - Primary  PHYSICIAN ASSISTANT: none  ASSISTANTS: Virgilio FreesKim Hardy RNFA   ANESTHESIA:   general  EBL:  Total I/O In: 1407.2 [I.V.:1077.2; Blood:330] Out: 650 [Urine:550; Blood:100]  BLOOD ADMINISTERED:none  DRAINS: none   LOCAL MEDICATIONS USED:  NONE  SPECIMEN:  Source of Specimen:  sternal abscess  DISPOSITION OF SPECIMEN:  Micro   PATIENT DISPOSITION:  To ICU on ventilator   Delay start of Pharmacological VTE agent (>24hrs) due to surgical blood loss or risk of bleeding: yes  FINDINGS- 4 x 7 cm abscess anterior to sternum, involvement of sternomanubrial joint, smaller abscess posterior to sternum  # [id number]

## 2015-11-01 NOTE — Progress Notes (Signed)
CRITICAL VALUE ALERT  Critical value received: Hemoglobin 6.4 Date of notification:  11/01/2015   Time of notification:  04:40  Critical value read back: yes  Nurse who received alert:  Nechama GuardJennifer Preet Mangano  MD notified (1st page): Dr Vassie LollAlva  Time of first page:  04:41  Responding MD:  Dr Vassie LollAlva  Time MD responded:  04:41

## 2015-11-01 NOTE — H&P (View-Only) (Signed)
   Assessment: 5 Days Post-Op  S/P Procedure(s) (LRB): IRRIGATION AND DEBRIDEMENT FOOT (Left) by Dr. Jewel Baizeimothy D. Eulah PontMurphy on 10/24/15  Principal Problem:   MRSA bacteremia Active Problems:   Acute respiratory failure (HCC)   Abscess   Altered mental status   Encounter for central line placement   Bilateral pneumonia   Sternal osteomyelitis (HCC)   Acute respiratory failure with hypercapnia (HCC)   HCAP (healthcare-associated pneumonia)  Disseminated MRSA bacteremia   WBC stable, slightly elevated, w/ mild Left shift.  Needing cooling blanket for Fevers.  Left Foot.  Surgical site/wound looks good.  Loose suture in place - center of would open to drain.  Scant serous drainage.  Left ankle palpation and passive ROM does not illicit pain response.  Non-erythematous. Left knee and thigh without swelling/erythema.   S/p multiple I&D's of extremities.    Osteomyelitis of sternum per CVTS  Right ankle: I/D by Dr. Magnus IvanBlackman 7/1.  Increasing erythema/swelling and pain w/ ROM.   Plan: Continue Abx and plan per CCM. Left Foot: Loose suture in place to allow drainage.  Continue pressure gauze dressing / kerlix / ace wrap.  Continue to follow extremities for new/worsening infection.    Repeat Right foot / ankle I/D likely.  Surgeon/time pending.   Weight Bearing: Weight Bearing as Tolerated (WBAT) left foot Dressings: Left foot Gauze / Kerlix / Ace wrap Daily.  Sutures to be removed in ~10-14 days post op. VTE prophylaxis: on sq heparin Dispo: per primary  Subjective: Intubated, sedated.   Objective:   VITALS:   Filed Vitals:   10/31/15 0419 10/31/15 0500 10/31/15 0600 10/31/15 0700  BP: 161/103 153/101 167/102 151/96  Pulse: 118 118 124 108  Temp:      TempSrc:      Resp: 19 15 16 15   Height:      Weight:      SpO2: 98% 94% 95% 98%    Lab Results  Component Value Date   WBC 11.9* 10/31/2015   HGB 7.2* 10/31/2015   HCT 23.1* 10/31/2015   MCV 86.2 10/31/2015   PLT  353 10/31/2015    Physical Exam General: NAD.  Intubated, sedated  MSK:  -Left Foot:  Intact pulses distally.  Foot warm.   Surgical site/wound looks good.  Loose suture in place - center of would open to drain.  Scant serous drainage.  Left ankle palpation and passive ROM does not illicit pain response.  Non-erythematous.  -Left knee and thigh without swelling/erythema. -Right foot/ankle wrapped with increased erythema, swelling.  Pain w/ manipulation/ROM.   Lucretia KernHenry Calvin Martensen III 10/31/2015, 7:41 AM

## 2015-11-01 NOTE — Op Note (Signed)
[name]:  Hutzler, [place]                   ACCOUNT NO.:  [id number]  MEDICAL RECORD NO.:  [id number]  LOCATION:  2M13C                        FACILITY:  [facility]  PHYSICIAN:  Salvatore DecentSteven C. Dorris FetchHendrickson, M.D.DATE OF BIRTH:  [birth year]  DATE OF PROCEDURE:  11/01/2015 DATE OF DISCHARGE:                              OPERATIVE REPORT   PREOPERATIVE DIAGNOSIS:  Sternal abscess.  POSTOPERATIVE DIAGNOSIS:  Sternal abscess.  PROCEDURE:  Irrigation and debridement of sternal abscess.  SURGEON:  Salvatore DecentSteven C. Dorris FetchHendrickson, M.D.  ASSISTANT:  Virgilio FreesKim Hardy, RNFA.  ANESTHESIA:  General.  FINDINGS:  A 4 x 7 cm abscess anterior to the sternum with frank pus, smaller abscess posterior to sternum, involvement of sternal manubrial joint.  CLINICAL NOTE:  Kevin Austin is a 25 year old with a history of intravenous drug abuse who was admitted with sepsis and multiple abscesses.  He has had multiple incision and drainage procedures for abscesses.  On CT of the chest, he had evidence of sternal osteomyelitis and appeared to have a phlegmon around the sternomanubrial joint.  He continued to have fevers and MR of the chest showed a probable abscess, both in front of and behind the sternum.  The patient was scheduled for an irrigation and debridement of the left ankle wound by Dr. Eulah PontMurphy.  I talked to the patient's mother about doing an incision and drainage of the chest wall abscess at the same time.  The indications, risks, benefits, and alternatives were discussed and she gave consent to the procedure.  DESCRIPTION OF PROCEDURE:  Kevin Austin was brought to the operating room on November 01, 2015.  He was already sedated and on a ventilator, he was given general anesthesia.  Dr. Eulah PontMurphy performed his procedure on the ankle.  As he was finishing, the chest was prepped and draped in the usual sterile fashion.  An incision was made overlying the soft tissue swelling anterior to the sternum.  The abscess was opened which contained  frank pus, specimens were sent for culture )the patient has had multiple prior positive cultures for MRSA).  The tissue was friable and bled easily.  There was necrotic soft tissue overlying the sternum, this was debrided with a rongeur.  There was a small pocket tracking superiorly and to the left side.  The intercostal space below the sternomanubrial junction was entered and some additional pus was drained.  There was pus and necrotic material in the sternomanubrial joint, this was debrided with a rongeur as was a portion of the anterior table of the sternum.  The wound was cleaned with a pulse lavage using 3 L of saline.  Final inspection was made for hemostasis. A roll of Kerlix with normal saline was used to pack the wound.  It was then covered with ABD pads.  The patient then was transported back to the Medical Intensive Care Unit in critical, but stable condition.     Salvatore DecentSteven C. Dorris FetchHendrickson, M.D.     SCH/MEDQ  D:  11/01/2015  T:  11/01/2015  Job:  [id number]

## 2015-11-01 NOTE — Progress Notes (Signed)
Physical Therapy Wound Treatment Patient Details  Name: Kevin Austin MRN: [id number] Date of Birth: December 04, 1990  Today's Date: 11/01/2015 Time: 7893-8101 Time Calculation (min): 48 min  Subjective  Subjective: Pt sedated during hydro session Patient and Family Stated Goals: None stated Date of Onset:  (Unknown) Prior Treatments: I&D 6/27  Pain Score:  Pt grimacing during treatment but sedated.   Wound Assessment  Wound / Incision (Open or Dehisced) 10/25/15 Incision - Open Hand Right Dorsal aspect - Ulnar side (Active)  Dressing Type Compression wrap;Gauze (Comment);Moist to dry 11/01/2015 12:00 PM  Dressing Changed Changed 11/01/2015 12:00 PM  Dressing Status Clean;Dry;Intact 11/01/2015 12:00 PM  Dressing Change Frequency Daily 11/01/2015 12:00 PM  Site / Wound Assessment Red 11/01/2015 12:00 PM  % Wound base Red or Granulating 90% 11/01/2015 12:00 PM  % Wound base Yellow 10% 11/01/2015 12:00 PM  % Wound base Black 0% 11/01/2015 12:00 PM  % Wound base Other (Comment) 0% 11/01/2015 12:00 PM  Peri-wound Assessment Intact;Erythema (blanchable);Edema 11/01/2015 12:00 PM  Wound Length (cm) 6.5 cm 10/25/2015  9:37 AM  Wound Width (cm) 1.5 cm 10/25/2015  9:37 AM  Wound Depth (cm) 1.2 cm 10/25/2015  9:37 AM  Undermining (cm) 3-1 o'clock 2.9 cm, 7.-10 o'clock 4.2 cm (connecting), 11-12 o'clock 3.5 cm 10/25/2015  9:37 AM  Margins Unattached edges (unapproximated) 11/01/2015 12:00 PM  Closure None 11/01/2015 12:00 PM  Drainage Amount Minimal 11/01/2015 12:00 PM  Drainage Description Serosanguineous 11/01/2015 12:00 PM  Non-staged Wound Description Not applicable 10/31/1023 85:27 PM  Treatment Hydrotherapy (Pulse lavage);Packing (Impregnated strip) 11/01/2015 12:00 PM     Wound / Incision (Open or Dehisced) 10/25/15 Incision - Open Hand Right Dorsal aspect - Radial side (Active)  Dressing Type Compression wrap;Gauze (Comment);Moist to dry 11/01/2015 12:00 PM  Dressing Changed Changed 11/01/2015 12:00 PM  Dressing Status  Clean;Dry;Intact 11/01/2015 12:00 PM  Dressing Change Frequency Daily 11/01/2015 12:00 PM  Site / Wound Assessment Red 11/01/2015 12:00 PM  % Wound base Red or Granulating 100% 11/01/2015 12:00 PM  % Wound base Yellow 0% 11/01/2015 12:00 PM  % Wound base Black 0% 11/01/2015 12:00 PM  % Wound base Other (Comment) 0% 11/01/2015 12:00 PM  Peri-wound Assessment Intact;Erythema (blanchable);Edema 11/01/2015 12:00 PM  Wound Length (cm) 5 cm 10/25/2015  9:37 AM  Wound Width (cm) 1 cm 10/25/2015  9:37 AM  Wound Depth (cm) 1.2 cm 10/25/2015  9:37 AM  Undermining (cm) 1-5 o'clock 4.5 cm (connecting), 9 o'clock 1.6 cm  10/25/2015  9:37 AM  Margins Unattached edges (unapproximated) 11/01/2015 12:00 PM  Closure None 11/01/2015 12:00 PM  Drainage Amount Minimal 11/01/2015 12:00 PM  Drainage Description Serosanguineous 11/01/2015 12:00 PM  Non-staged Wound Description Not applicable 11/02/2421 53:61 PM  Treatment Hydrotherapy (Pulse lavage);Packing (Impregnated strip) 11/01/2015 12:00 PM     Wound / Incision (Open or Dehisced) 10/25/15 Incision - Open Hand Left Dorsal aspect (Active)  Dressing Type Compression wrap;Gauze (Comment);Moist to dry 11/01/2015 12:00 PM  Dressing Changed Changed 11/01/2015 12:00 PM  Dressing Status Clean;Dry;Intact 11/01/2015 12:00 PM  Dressing Change Frequency Daily 11/01/2015 12:00 PM  Site / Wound Assessment Red 11/01/2015 12:00 PM  % Wound base Red or Granulating 100% 11/01/2015 12:00 PM  % Wound base Yellow 0% 11/01/2015 12:00 PM  % Wound base Black 0% 11/01/2015 12:00 PM  % Wound base Other (Comment) 0% 11/01/2015 12:00 PM  Peri-wound Assessment Erythema (blanchable);Intact 11/01/2015 12:00 PM  Wound Length (cm) 4 cm 10/25/2015  9:37 AM  Wound Width (cm) 0.8  cm 10/25/2015  9:37 AM  Wound Depth (cm) 1.5 cm 10/25/2015  9:37 AM  Undermining (cm) 2-4 o'clock 2.7 cm, 10 o'clock 2.1 cm 10/25/2015  9:37 AM  Margins Unattached edges (unapproximated) 11/01/2015 12:00 PM  Closure None 11/01/2015 12:00 PM  Drainage Amount Minimal  11/01/2015 12:00 PM  Drainage Description Serosanguineous 11/01/2015 12:00 PM  Non-staged Wound Description Not applicable 10/03/3417 62:22 PM  Treatment Hydrotherapy (Pulse lavage);Packing (Impregnated strip) 11/01/2015 12:00 PM     Wound / Incision (Open or Dehisced) 10/25/15 Incision - Open Hand Left Palmar aspect (Active)  Dressing Type Compression wrap;Gauze (Comment);Moist to dry 11/01/2015 12:00 PM  Dressing Changed Changed 11/01/2015 12:00 PM  Dressing Status Clean;Dry;Intact 11/01/2015 12:00 PM  Dressing Change Frequency Daily 11/01/2015 12:00 PM  Site / Wound Assessment Red 11/01/2015 12:00 PM  % Wound base Red or Granulating 100% 11/01/2015 12:00 PM  % Wound base Yellow 0% 11/01/2015 12:00 PM  % Wound base Black 0% 11/01/2015 12:00 PM  % Wound base Other (Comment) 0% 11/01/2015 12:00 PM  Peri-wound Assessment Intact;Maceration 11/01/2015 12:00 PM  Wound Length (cm) 0.3 cm 10/25/2015  9:37 AM  Wound Width (cm) 4.1 cm 10/25/2015  9:37 AM  Wound Depth (cm) 1.8 cm 10/25/2015  9:37 AM  Undermining (cm) 7 o'clock 3.0 cm, 9 o'clock 2.2 cm, 1 o'clock .7 cm, 3-6 o'clock 1.2 cm 10/25/2015  9:37 AM  Margins Unattached edges (unapproximated) 11/01/2015 12:00 PM  Closure None 11/01/2015 12:00 PM  Drainage Amount Moderate 11/01/2015 12:00 PM  Drainage Description Serosanguineous 11/01/2015 12:00 PM  Non-staged Wound Description Not applicable 01/03/9891 11:94 PM  Treatment Hydrotherapy (Pulse lavage);Packing (Impregnated strip) 11/01/2015 12:00 PM   Hydrotherapy Pulsed lavage therapy - wound location: Bilateral hands Pulsed Lavage with Suction (psi): 8 psi Pulsed Lavage with Suction - Normal Saline Used: 1000 mL (Between bilaeral hands) Pulsed Lavage Tip: Tunneling tip   Wound Assessment and Plan  Wound Therapy - Assess/Plan/Recommendations Wound Therapy - Clinical Statement: PROM to fingers and wrist. Noted some dark yellow drainage on the R ulnar wound.  Wound Therapy - Functional Problem List: Decreased strength/AROM of  hands/fingers for ADL's and fine motor tasks.  Factors Delaying/Impairing Wound Healing: Substance abuse;Multiple medical problems Hydrotherapy Plan: Patient/family education;Pulsatile lavage with suction;Dressing change Wound Therapy - Frequency: 6X / week Wound Therapy - Follow Up Recommendations: Other (comment) (Per hand surgeon) Wound Plan: See above  Wound Therapy Goals- Improve the function of patient's integumentary system by progressing the wound(s) through the phases of wound healing (inflammation - proliferation - remodeling) by: Patient/Family will be able to : complete dressing changes independently prior to d/c Patient/Family Instruction Goal - Progress: Not progressing Goals/treatment plan/discharge plan were made with and agreed upon by patient/family: No, Patient unable to participate in goals/treatment/discharge plan and family unavailable Time For Goal Achievement: 7 days Wound Therapy - Potential for Goals: Excellent  Goals will be updated until maximal potential achieved or discharge criteria met.  Discharge criteria: when goals achieved, discharge from hospital, MD decision/surgical intervention, no progress towards goals, refusal/missing three consecutive treatments without notification or medical reason.  GP     Rolinda Roan 11/01/2015, 12:09 PM   Rolinda Roan, PT, DPT Acute Rehabilitation Services Pager: [phone]
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
## 2015-11-01 NOTE — Transfer of Care (Signed)
Immediate Anesthesia Transfer of Care Note  Patient: Jolene Schimkerey Spicher  Procedure(s) Performed: Procedure(s): IRRIGATION AND DEBRIDEMENT OF ANKLE AND SOFT TISSUE (Right) IRRIGATION AND DEBRIDEMENT OF STERNUM W/ POSSIBLE WOUND VAC PLACEMENT (N/A)  Patient Location: ICU  Anesthesia Type:General  Level of Consciousness: Patient remains intubated per anesthesia plan  Airway & Oxygen Therapy: Patient remains intubated per anesthesia plan  Post-op Assessment: Report given to RN and Post -op Vital signs reviewed and stable  Post vital signs: Reviewed and stable  Last Vitals:  Filed Vitals:   11/01/15 1500 11/01/15 1544  BP: 137/89 150/97  Pulse: 103 120  Temp:    Resp: 20 20    Last Pain:  Filed Vitals:   11/01/15 1559  PainSc: Asleep         Complications: No apparent anesthesia complications

## 2015-11-01 NOTE — Op Note (Signed)
10/22/2015 - 11/01/2015  4:47 PM  PATIENT:  Kevin Austin    PRE-OPERATIVE DIAGNOSIS:  acute resp failure  POST-OPERATIVE DIAGNOSIS:  Same  PROCEDURE:  IRRIGATION AND DEBRIDEMENT OF ANKLE AND SOFT TISSUE  SURGEON:  Harli Engelken, Jewel BaizeIMOTHY D, MD  ASSISTANT: Aquilla HackerHenry Martensen, PA-C, She was present and scrubbed throughout the case, critical for completion in a timely fashion, and for retraction, instrumentation, and closure.   ANESTHESIA:   gen  PREOPERATIVE INDICATIONS:  Kevin Austin is a  25 y.o. male with a diagnosis of acute resp failure who failed conservative measures and elected for surgical management.    The risks benefits and alternatives were discussed with the patient preoperatively including but not limited to the risks of infection, bleeding, nerve injury, cardiopulmonary complications, the need for revision surgery, among others, and the patient was willing to proceed.  OPERATIVE IMPLANTS: none  OPERATIVE FINDINGS: purulent fluid  BLOOD LOSS: 40cc  COMPLICATIONS: none  TOURNIQUET TIME: none  OPERATIVE PROCEDURE:  Patient was identified in the preoperative holding area and site was marked by me He was transported to the operating theater and placed on the table in supine position taking care to pad all bony prominences. After a preincinduction time out anesthesia was induced. The right lower extremity was prepped and draped in normal sterile fashion and a pre-incision timeout was performed. He received vanc for preoperative antibiotics.   I opened his previous incisions medially had noted purulent fluid from the posterior aspect of his tibia.  I performed 18 a lyses of his posterior tibial tendon and his flexor digitorum. Identified his posterior tibial nerve and protected this.  I used a Cobb to strip further up into the compartment of purulence expressed a little more purulence from proximal. I then thoroughly irrigated this area. I opened up his anterior incisions I did not find any  purulence in his joint I did spine some laterally around the lateral aspect of the ankle. I performed 18 a lyses of his anterior tibialis tendon. I did provide performed an excisional debridement of the necrotic tissue on this lateral aspect as well as a thorough irrigation of all wounds using 3 L of saline each.  Once as satisfied with the debridement which was excisional using a scissor curet and knife. And I was also satisfied with the irrigation that there are no additional pockets of purulence I placed a Hemovac drain across the anterior aspect of the joint as well as on the posterior tibia. I loosely closed his incisions with nylon stitches. Sterile dressing was applied case was turned over to Dr. Dorris FetchHendrickson who was working on his sternal debridement.  POST OPERATIVE PLAN: NWB RLE, chemical DVT prophylaxis per the primary team    This note was generated using a template and dragon dictation system. In light of that, I have reviewed the note and all aspects of it are applicable to this case. Any dictation errors are due to the computerized dictation system.

## 2015-11-01 NOTE — Progress Notes (Signed)
[facility]ious [facility]    Date of Admission:  10/22/2015   Total days of antibiotics 11 Day 11 vancomycin  Principal Problem:   MRSA bacteremia Active Problems:   Acute respiratory failure (HCC)   Abscess   Altered mental status   Encounter for central line placement   Bilateral pneumonia   Sternal osteomyelitis ([place])   HCAP (healthcare-associated pneumonia)   Acute pulmonary edema ([place])   . antiseptic oral rinse  7 mL Mouth Rinse QID  .  ceFAZolin (ANCEF) IV  2 g Intravenous To SS-Surg  . chlorhexidine gluconate (SAGE KIT)  15 mL Mouth Rinse BID  . famotidine (PEPCID) IV  20 mg Intravenous Q12H  . feeding supplement (VITAL HIGH PROTEIN)  1,000 mL Per Tube Q24H  . furosemide  20 mg Intravenous Q12H  . heparin  5,000 Units Subcutaneous Q8H  . methadone  20 mg Per Tube Q8H  . sodium chloride  1,000 mL Intravenous Once  . tobramycin  1.2 g Topical To OR  . vancomycin  1,500 mg Intravenous Q12H  . vancomycin  1,000 mg Other To OR    SUBJECTIVE: He is intubated and sedated, and follows basic commands.  Review of Systems: Review of Systems  Unable to perform ROS: intubated    History reviewed. No pertinent past medical history.  Social History  Substance Use Topics  . Smoking status: None  . Smokeless tobacco: None  . Alcohol Use: None    History reviewed. No pertinent family history. Allergies  Allergen Reactions  . Ketorolac Nausea Only    Per [facility] records  . Tramadol Nausea Only    Per [facility] records    OBJECTIVE: Filed Vitals:   11/01/15 1200 11/01/15 1300 11/01/15 1350 11/01/15 1400  BP: 135/85 130/84 134/86 145/94  Pulse: 98 100 115 107  Temp:   99 F (37.2 C)   TempSrc:   Core (Comment)   Resp: '20 20 20 15  ' Height:      Weight:      SpO2: 100% 100% 100% 100%   Body mass index is 29.07 kg/(m^2).  Physical Exam  Eyes:  Conjunctivae are normal.  Cardiovascular: Normal rate and regular rhythm.   Pulmonary/Chest:  Bilateral crackles, increased fluctuance present over upper half of sternum  Musculoskeletal:  2+ pitting edema of lower extremities below the knees b/l, hands and feet in ACE wraps  Skin:  Diffuse folliculitis on chest, arms, and legs   Lab Results Lab Results  Component Value Date   WBC 11.9* 11/01/2015   HGB 7.4* 11/01/2015   HCT 23.3* 11/01/2015   MCV 86.0 11/01/2015   PLT 315 11/01/2015    Lab Results  Component Value Date   CREATININE 0.50* 11/01/2015   BUN 9 11/01/2015   NA 137 11/01/2015   K 3.6 11/01/2015   CL 98* 11/01/2015   CO2 32 11/01/2015    Lab Results  Component Value Date   ALT 32 10/30/2015   AST 67* 10/30/2015   ALKPHOS 484* 10/30/2015   BILITOT 2.7* 10/30/2015     Microbiology: Recent Results (from the past 240 hour(s))  MRSA PCR Screening     Status: Abnormal   Collection Time: 10/22/15  5:41 PM  Result Value Ref Range Status   MRSA by PCR POSITIVE (A) NEGATIVE Final    Comment:        The GeneXpert MRSA Assay (FDA approved for NASAL specimens only), is one component of  a comprehensive MRSA colonization surveillance program. It is not intended to diagnose MRSA infection nor to guide or monitor treatment for MRSA infections. RESULT CALLED TO, READ BACK BY AND VERIFIED WITH: A THOMPSON,RN '@[id number]'  10/23/15 MKELLY   Urine culture     Status: Abnormal   Collection Time: 10/22/15  6:38 PM  Result Value Ref Range Status   Specimen Description URINE, CATHETERIZED  Final   Special Requests NONE  Final   Culture 2,000 COLONIES/mL INSIGNIFICANT GROWTH (A)  Final   Report Status 10/24/2015 FINAL  Final  Culture, blood (Routine X 2) w Reflex to ID Panel     Status: Abnormal   Collection Time: 10/22/15  6:44 PM  Result Value Ref Range Status   Specimen Description BLOOD LEFT HAND  Final   Special Requests BOTTLES DRAWN AEROBIC AND ANAEROBIC 5 CC   Final     Culture  Setup Time   Final    GRAM POSITIVE COCCI IN CLUSTERS IN BOTH AEROBIC AND ANAEROBIC BOTTLES CRITICAL RESULT CALLED TO, READ BACK BY AND VERIFIED WITH: C STEWART,PHARMD AT 1040 10/23/15 BY L BENFIELD    Culture (A)  Final    STAPHYLOCOCCUS AUREUS SUSCEPTIBILITIES PERFORMED ON PREVIOUS CULTURE WITHIN THE LAST 5 DAYS.    Report Status 10/25/2015 FINAL  Final  Culture, blood (Routine X 2) w Reflex to ID Panel     Status: Abnormal   Collection Time: 10/22/15  7:02 PM  Result Value Ref Range Status   Specimen Description BLOOD LEFT HAND  Final   Special Requests BOTTLES DRAWN AEROBIC AND ANAEROBIC 5 CC   Final   Culture  Setup Time   Final    GRAM POSITIVE COCCI IN CLUSTERS IN BOTH AEROBIC AND ANAEROBIC BOTTLES CRITICAL RESULT CALLED TO, READ BACK BY AND VERIFIED WITH: C STEWART,PHARMD AT 1040 10/23/15 BY L BENFIELD    Culture METHICILLIN RESISTANT STAPHYLOCOCCUS AUREUS (A)  Final   Report Status 10/25/2015 FINAL  Final   Organism ID, Bacteria METHICILLIN RESISTANT STAPHYLOCOCCUS AUREUS  Final      Susceptibility   Methicillin resistant staphylococcus aureus - MIC*    CIPROFLOXACIN <=0.5 SENSITIVE Sensitive     ERYTHROMYCIN >=8 RESISTANT Resistant     GENTAMICIN <=0.5 SENSITIVE Sensitive     OXACILLIN >=4 RESISTANT Resistant     TETRACYCLINE <=1 SENSITIVE Sensitive     VANCOMYCIN <=0.5 SENSITIVE Sensitive     TRIMETH/SULFA <=10 SENSITIVE Sensitive     CLINDAMYCIN <=0.25 SENSITIVE Sensitive     RIFAMPIN <=0.5 SENSITIVE Sensitive     Inducible Clindamycin NEGATIVE Sensitive     * METHICILLIN RESISTANT STAPHYLOCOCCUS AUREUS  Blood Culture ID Panel (Reflexed)     Status: Abnormal   Collection Time: 10/22/15  7:02 PM  Result Value Ref Range Status   Enterococcus species NOT DETECTED NOT DETECTED Final   Vancomycin resistance NOT DETECTED NOT DETECTED Final   Listeria monocytogenes NOT DETECTED NOT DETECTED Final   Staphylococcus species DETECTED (A) NOT DETECTED Final     Comment: CRITICAL RESULT CALLED TO, READ BACK BY AND VERIFIED WITH: C STEWART,PHARMD AT 1040 10/23/15 BY L BENFIELD    Staphylococcus aureus DETECTED (A) NOT DETECTED Final    Comment: CRITICAL RESULT CALLED TO, READ BACK BY AND VERIFIED WITH: C STEWART,PHARMD AT 1040 10/23/15 BY L BENFIELD    Methicillin resistance DETECTED (A) NOT DETECTED Final    Comment: CRITICAL RESULT CALLED TO, READ BACK BY AND VERIFIED WITH: C STEWART,PHARMD AT 1040 10/23/15 BY L BENFIELD  Streptococcus species NOT DETECTED NOT DETECTED Final   Streptococcus agalactiae NOT DETECTED NOT DETECTED Final   Streptococcus pneumoniae NOT DETECTED NOT DETECTED Final   Streptococcus pyogenes NOT DETECTED NOT DETECTED Final   Acinetobacter baumannii NOT DETECTED NOT DETECTED Final   Enterobacteriaceae species NOT DETECTED NOT DETECTED Final   Enterobacter cloacae complex NOT DETECTED NOT DETECTED Final   Escherichia coli NOT DETECTED NOT DETECTED Final   Klebsiella oxytoca NOT DETECTED NOT DETECTED Final   Klebsiella pneumoniae NOT DETECTED NOT DETECTED Final   Proteus species NOT DETECTED NOT DETECTED Final   Serratia marcescens NOT DETECTED NOT DETECTED Final   Carbapenem resistance NOT DETECTED NOT DETECTED Final   Haemophilus influenzae NOT DETECTED NOT DETECTED Final   Neisseria meningitidis NOT DETECTED NOT DETECTED Final   Pseudomonas aeruginosa NOT DETECTED NOT DETECTED Final   Candida albicans NOT DETECTED NOT DETECTED Final   Candida glabrata NOT DETECTED NOT DETECTED Final   Candida krusei NOT DETECTED NOT DETECTED Final   Candida parapsilosis NOT DETECTED NOT DETECTED Final   Candida tropicalis NOT DETECTED NOT DETECTED Final  Fungus Culture With Stain (Not @ [facility])     Status: None (Preliminary result)   Collection Time: 10/23/15  7:09 PM  Result Value Ref Range Status   Fungus Stain Final report  Final    Comment: (NOTE) Performed At: [facility] [place], [place]  [id number] Lindon Romp MD OV:[id number]    Fungus (Mycology) Culture PENDING  Incomplete   Fungal Source ABSCESS  Final    Comment: RIGHT HAND   Aerobic/Anaerobic Culture (surgical/deep wound)     Status: None   Collection Time: 10/23/15  7:09 PM  Result Value Ref Range Status   Specimen Description ABSCESS HAND RIGHT  Final   Special Requests PT ON VANC FORTAZ ZOSYN  Final   Gram Stain   Final    MODERATE WBC PRESENT,BOTH PMN AND MONONUCLEAR FEW GRAM POSITIVE COCCI IN PAIRS IN CLUSTERS    Culture   Final    MODERATE METHICILLIN RESISTANT STAPHYLOCOCCUS AUREUS NO ANAEROBES ISOLATED    Report Status 10/28/2015 FINAL  Final   Organism ID, Bacteria METHICILLIN RESISTANT STAPHYLOCOCCUS AUREUS  Final      Susceptibility   Methicillin resistant staphylococcus aureus - MIC*    CIPROFLOXACIN <=0.5 SENSITIVE Sensitive     ERYTHROMYCIN >=8 RESISTANT Resistant     GENTAMICIN <=0.5 SENSITIVE Sensitive     OXACILLIN >=4 RESISTANT Resistant     TETRACYCLINE <=1 SENSITIVE Sensitive     VANCOMYCIN <=0.5 SENSITIVE Sensitive     TRIMETH/SULFA <=10 SENSITIVE Sensitive     CLINDAMYCIN <=0.25 SENSITIVE Sensitive     RIFAMPIN <=0.5 SENSITIVE Sensitive     Inducible Clindamycin NEGATIVE Sensitive     * MODERATE METHICILLIN RESISTANT STAPHYLOCOCCUS AUREUS  Fungus Culture Result     Status: None   Collection Time: 10/23/15  7:09 PM  Result Value Ref Range Status   Result 1 Comment  Final    Comment: (NOTE) KOH/Calcofluor preparation:  no fungus observed. Performed At: [facility] [place], [place] [id number] Lindon Romp MD HM:[id number]   Aerobic/Anaerobic Culture (surgical/deep wound)     Status: None   Collection Time: 10/23/15  7:17 PM  Result Value Ref Range Status   Specimen Description ABSCESS LEFT FOOT  Final   Special Requests PT ON VANC FORTAZ ZOSYN  Final   Gram Stain   Final    MODERATE WBC PRESENT,BOTH PMN AND  MONONUCLEAR FEW GRAM POSITIVE  COCCI IN CLUSTERS    Culture   Final    FEW METHICILLIN RESISTANT STAPHYLOCOCCUS AUREUS NO ANAEROBES ISOLATED    Report Status 10/28/2015 FINAL  Final   Organism ID, Bacteria METHICILLIN RESISTANT STAPHYLOCOCCUS AUREUS  Final      Susceptibility   Methicillin resistant staphylococcus aureus - MIC*    CIPROFLOXACIN <=0.5 SENSITIVE Sensitive     ERYTHROMYCIN >=8 RESISTANT Resistant     GENTAMICIN <=0.5 SENSITIVE Sensitive     OXACILLIN >=4 RESISTANT Resistant     TETRACYCLINE <=1 SENSITIVE Sensitive     VANCOMYCIN <=0.5 SENSITIVE Sensitive     TRIMETH/SULFA <=10 SENSITIVE Sensitive     CLINDAMYCIN <=0.25 SENSITIVE Sensitive     RIFAMPIN <=0.5 SENSITIVE Sensitive     Inducible Clindamycin NEGATIVE Sensitive     * FEW METHICILLIN RESISTANT STAPHYLOCOCCUS AUREUS  Culture, respiratory (NON-Expectorated)     Status: None   Collection Time: 10/24/15  3:29 AM  Result Value Ref Range Status   Specimen Description TRACHEAL ASPIRATE  Final   Special Requests Normal  Final   Gram Stain   Final    ABUNDANT WBC PRESENT, PREDOMINANTLY PMN RARE SQUAMOUS EPITHELIAL CELLS PRESENT RARE GRAM POSITIVE COCCI IN CLUSTERS    Culture FEW METHICILLIN RESISTANT STAPHYLOCOCCUS AUREUS  Final   Report Status 10/26/2015 FINAL  Final   Organism ID, Bacteria METHICILLIN RESISTANT STAPHYLOCOCCUS AUREUS  Final      Susceptibility   Methicillin resistant staphylococcus aureus - MIC*    CIPROFLOXACIN <=0.5 SENSITIVE Sensitive     ERYTHROMYCIN >=8 RESISTANT Resistant     GENTAMICIN <=0.5 SENSITIVE Sensitive     OXACILLIN >=4 RESISTANT Resistant     TETRACYCLINE <=1 SENSITIVE Sensitive     VANCOMYCIN 1 SENSITIVE Sensitive     TRIMETH/SULFA <=10 SENSITIVE Sensitive     CLINDAMYCIN <=0.25 SENSITIVE Sensitive     RIFAMPIN <=0.5 SENSITIVE Sensitive     Inducible Clindamycin NEGATIVE Sensitive     * FEW METHICILLIN RESISTANT STAPHYLOCOCCUS AUREUS  Culture, blood (routine x 2)     Status: None   Collection Time:  10/24/15  1:51 PM  Result Value Ref Range Status   Specimen Description BLOOD RIGHT ARM  Final   Special Requests BOTTLES DRAWN AEROBIC AND ANAEROBIC  Soso   Final   Culture NO GROWTH 5 DAYS  Final   Report Status 10/29/2015 FINAL  Final  Culture, blood (routine x 2)     Status: None   Collection Time: 10/24/15  2:05 PM  Result Value Ref Range Status   Specimen Description BLOOD RIGHT ARM  Final   Special Requests BOTTLES DRAWN AEROBIC AND ANAEROBIC 10CC   Final   Culture NO GROWTH 5 DAYS  Final   Report Status 10/29/2015 FINAL  Final  Blood Culture ID Panel (Reflexed)     Status: Abnormal   Collection Time: 10/24/15  2:05 PM  Result Value Ref Range Status   Enterococcus species NOT DETECTED NOT DETECTED Final   Vancomycin resistance NOT DETECTED NOT DETECTED Final   Listeria monocytogenes NOT DETECTED NOT DETECTED Final   Staphylococcus species DETECTED (A) NOT DETECTED Final    Comment: CRITICAL RESULT CALLED TO, READ BACK BY AND VERIFIED WITH: DR Baxter Flattery 10/24/15 @ 1607 M VESTAL    Staphylococcus aureus DETECTED (A) NOT DETECTED Final    Comment: CRITICAL RESULT CALLED TO, READ BACK BY AND VERIFIED WITH: DR Baxter Flattery 10/24/15 @ 1607 M VESTAL    Methicillin resistance NOT DETECTED  NOT DETECTED Final   Streptococcus species NOT DETECTED NOT DETECTED Final   Streptococcus agalactiae NOT DETECTED NOT DETECTED Final   Streptococcus pneumoniae NOT DETECTED NOT DETECTED Final   Streptococcus pyogenes NOT DETECTED NOT DETECTED Final   Acinetobacter baumannii NOT DETECTED NOT DETECTED Final   Enterobacteriaceae species NOT DETECTED NOT DETECTED Final   Enterobacter cloacae complex NOT DETECTED NOT DETECTED Final   Escherichia coli NOT DETECTED NOT DETECTED Final   Klebsiella oxytoca NOT DETECTED NOT DETECTED Final   Klebsiella pneumoniae NOT DETECTED NOT DETECTED Final   Proteus species NOT DETECTED NOT DETECTED Final   Serratia marcescens NOT DETECTED NOT DETECTED Final   Carbapenem  resistance NOT DETECTED NOT DETECTED Final   Haemophilus influenzae NOT DETECTED NOT DETECTED Final   Neisseria meningitidis NOT DETECTED NOT DETECTED Final   Pseudomonas aeruginosa NOT DETECTED NOT DETECTED Final   Candida albicans NOT DETECTED NOT DETECTED Final   Candida glabrata NOT DETECTED NOT DETECTED Final   Candida krusei NOT DETECTED NOT DETECTED Final   Candida parapsilosis NOT DETECTED NOT DETECTED Final   Candida tropicalis NOT DETECTED NOT DETECTED Final  Culture, blood (routine x 2)     Status: None   Collection Time: 10/26/15  3:00 PM  Result Value Ref Range Status   Specimen Description BLOOD RIGHT ANTECUBITAL  Final   Special Requests BOTTLES DRAWN AEROBIC ONLY 5CC  Final   Culture NO GROWTH 5 DAYS  Final   Report Status 10/31/2015 FINAL  Final  Culture, blood (routine x 2)     Status: None   Collection Time: 10/26/15  3:07 PM  Result Value Ref Range Status   Specimen Description BLOOD RIGHT ANTECUBITAL  Final   Special Requests BOTTLES DRAWN AEROBIC ONLY 5CC  Final   Culture NO GROWTH 5 DAYS  Final   Report Status 10/31/2015 FINAL  Final  Body fluid culture     Status: None   Collection Time: 10/27/15  1:57 PM  Result Value Ref Range Status   Specimen Description FLUID ANKLE  Final   Special Requests Immunocompromised  Final   Gram Stain   Final    ABUNDANT WBC PRESENT, PREDOMINANTLY PMN MODERATE GRAM POSITIVE COCCI IN CLUSTERS    Culture   Final    MODERATE METHICILLIN RESISTANT STAPHYLOCOCCUS AUREUS   Report Status 10/30/2015 FINAL  Final   Organism ID, Bacteria METHICILLIN RESISTANT STAPHYLOCOCCUS AUREUS  Final      Susceptibility   Methicillin resistant staphylococcus aureus - MIC*    CIPROFLOXACIN <=0.5 SENSITIVE Sensitive     ERYTHROMYCIN >=8 RESISTANT Resistant     GENTAMICIN <=0.5 SENSITIVE Sensitive     OXACILLIN >=4 RESISTANT Resistant     TETRACYCLINE <=1 SENSITIVE Sensitive     VANCOMYCIN <=0.5 SENSITIVE Sensitive     TRIMETH/SULFA <=10  SENSITIVE Sensitive     CLINDAMYCIN <=0.25 SENSITIVE Sensitive     RIFAMPIN <=0.5 SENSITIVE Sensitive     Inducible Clindamycin NEGATIVE Sensitive     * MODERATE METHICILLIN RESISTANT STAPHYLOCOCCUS AUREUS     ASSESSMENT: Disseminated MRSA bacteremia: Mr. Flamenco infection is complicated by multiple abscess formation in bilateral extremities, at sternum, and now possibly in iliopsoas on abdominal CT. He is 11 days into treatment with vancomycin and there has been partial improvement in his fever curve and hemodynamic status. His vancomycin levels have been more variable over the past 2 days so continuing close monitoring may be appropriate for now and pharmacy's help in this is appreciated. At this point  antibiotic penetrance is also a limitation and he still requires surgical drainage of well organized abscesses, particularly his sternum has become increasingly fluctuant with large fluid collections seen on MRI.  PLAN: 1. Continue vancomycin  Collier Salina, MD PGY-II Internal Medicine Resident Pager# [phone] 11/01/2015, 3:19 PM
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
## 2015-11-01 NOTE — Procedures (Signed)
Bedside Tracheostomy Insertion Procedure Note   Patient Details:   Name: Kevin Austin DOB: Mar 27, 1991 MRN: [id number]  Procedure: Tracheostomy  Pre Procedure Assessment: ET Tube Size:7.5  ET Tube secured at lip (cm): 24  Bite block in place: Yes Breath Sounds: Clear  Post Procedure Assessment: BP 133/93 mmHg  Pulse 96  Temp(Src) 99 F (37.2 C) (Core (Comment))  Resp 18  Ht 5\' 11"  (1.803 m)  Wt 208 lb 5.4 oz (94.5 kg)  BMI 29.07 kg/m2  SpO2 100% O2 sats: stable throughout Complications: No apparent complications Patient did tolerate procedure well Tracheostomy Brand:Shiley Tracheostomy Style:Cuffed Tracheostomy Size: 8.0 Tracheostomy Secured HQI:ONGEXBMvia:Sutures Tracheostomy Placement Confirmation:Trach cuff visualized and in place and Chest X ray ordered for placement    Loletta SpecterSmith, Jazmen Lindenbaum Sands 11/01/2015, 9:43 AM

## 2015-11-01 NOTE — Interval H&P Note (Signed)
History and Physical Interval Note:  Mr. Kevin Austin had a MR of the chest yesterday which showed some progression of the fluid collection around the sternomanubrial joint. There also is likely progression of the sternal osteomyelitis as well. On exam his anterior chest wall has more swelling and is now fluctuant as well. I think the best thing at this point is to I & D the sternomanubrial joint/ sternum to make sure there is no undrained abscess. I discussed the procedure including the indications, risks, benefits and alternatives with his mother, Mrs. Kevin Austin. She gives consent to proceed. Will do concurrently with Dr. Greig RightMurphy's I & D of the ankle  Salvatore DecentSteven C. Dorris FetchHendrickson, MD [facility] [phone]\  11/01/2015 3:51 PM  Kevin Austin  has presented today for surgery, with the diagnosis of acute resp failure  The various methods of treatment have been discussed with the patient and family. After consideration of risks, benefits and other options for treatment, the patient has consented to  Procedure(s): IRRIGATION AND DEBRIDEMENT OF ANKLE AND SOFT TISSUE (Right) IRRIGATION AND DEBRIDEMENT OF STERNUM W/ POSSIBLE WOUND VAC PLACEMENT (N/A) as a surgical intervention .  The patient's history has been reviewed, patient examined, no change in status, stable for surgery.  I have reviewed the patient's chart and labs.  Questions were answered to the patient's satisfaction.     Loreli SlotSteven C Hendrickson

## 2015-11-01 NOTE — Interval H&P Note (Signed)
History and Physical Interval Note:  11/01/2015 2:26 PM  Kevin Austin  has presented today for surgery, with the diagnosis of acute resp failure  The various methods of treatment have been discussed with the patient and family. After consideration of risks, benefits and other options for treatment, the patient has consented to  Procedure(s): IRRIGATION AND DEBRIDEMENT OF ANKLE AND SOFT TISSUE (Right) IRRIGATION AND DEBRIDEMENT OF STERNUM W/ POSSIBLE WOUND VAC PLACEMENT (N/A) as a surgical intervention .  The patient's history has been reviewed, patient examined, no change in status, stable for surgery.  I have reviewed the patient's chart and labs.  Questions were answered to the patient's satisfaction.     [name] D

## 2015-11-01 NOTE — Anesthesia Preprocedure Evaluation (Addendum)
Anesthesia Evaluation  Patient identified by MRN, date of birth, ID band Patient unresponsive    Reviewed: Allergy & Precautions, NPO status , Patient's Chart, lab work & pertinent test results  Airway Mallampati: Trach  TM Distance: >3 FB Neck ROM: Full    Dental no notable dental hx.    Pulmonary neg pulmonary ROS,    Pulmonary exam normal breath sounds clear to auscultation       Cardiovascular negative cardio ROS Normal cardiovascular exam Rhythm:Regular Rate:Normal  Echo 10/24/2015 - Left ventricle: The cavity size was normal. Wall thickness was normal. Systolic function was normal. The estimated ejection fraction was in the range of 60% to 65%. Wall motion was normal; there were no regional wall motion abnormalities. - Aortic valve: No evidence of vegetation. There was no stenosis. - Aorta: Normal caliber thoracic aorta. - Mitral valve: No evidence of vegetation. - Left atrium: No evidence of thrombus in the atrial cavity or appendage. - Right ventricle: The cavity size was normal. Systolic function was normal. - Right atrium: No evidence of thrombus in the atrial cavity or appendage. - Atrial septum: No defect or patent foramen ovale was identified. - Tricuspid valve: No evidence of vegetation. - Pulmonic valve: No evidence of vegetation.  Impressions: - No evidence of endocarditis.   Neuro/Psych negative neurological ROS  negative psych ROS   GI/Hepatic negative GI ROS, Neg liver ROS,   Endo/Other  negative endocrine ROS  Renal/GU negative Renal ROS  negative genitourinary   Musculoskeletal negative musculoskeletal ROS (+)   Abdominal   Peds negative pediatric ROS (+)  Hematology negative hematology ROS (+)   Anesthesia Other Findings   Reproductive/Obstetrics negative OB ROS                           Anesthesia Physical  Anesthesia Plan  ASA: III  Anesthesia Plan: General    Post-op Pain Management:    Induction: Inhalational  Airway Management Planned: Tracheostomy  Additional Equipment:   Intra-op Plan:   Post-operative Plan: Post-operative intubation/ventilation  Informed Consent: I have reviewed the patients History and Physical, chart, labs and discussed the procedure including the risks, benefits and alternatives for the proposed anesthesia with the patient or authorized representative who has indicated his/her understanding and acceptance.   Dental advisory given  Plan Discussed with: CRNA  Anesthesia Plan Comments:        Anesthesia Quick Evaluation

## 2015-11-01 NOTE — Progress Notes (Signed)
Pharmacy Antibiotic Note  Kevin Austin is a 25 y.o. male admitted on 10/22/2015 with pneumonia, bacteremia and wound infection.  Pharmacy has been consulted for Vancomycin dosing. Pt had a previously elevated vancomycin trough of 34 on 7/5, vancomycin random level this AM is 10, renal function appears stable  Plan: -Re-start vancomycin at 1500 mg IV q12h -Re-check vancomycin trough at steady state or with any significant clinical changes  Height: 5\' 11"  (180.3 cm) Weight: 208 lb 5.4 oz (94.5 kg) IBW/kg (Calculated) : 75.3  Temp (24hrs), Avg:99.6 F (37.6 C), Min:98.6 F (37 C), Max:101 F (38.3 C)   Recent Labs Lab 10/28/15 0402 10/28/15 1420 10/29/15 0425 10/30/15 0400 10/30/15 0410 10/31/15 0353 10/31/15 1408 11/01/15 0355  WBC 10.2  --  12.0* 13.9*  --  11.9*  --  11.2*  CREATININE 0.65  --  0.58*  --  0.53* 0.43*  --  0.50*  VANCOTROUGH  --  17  --   --   --   --  34*  --   VANCORANDOM  --   --   --   --   --   --   --  10    Estimated Creatinine Clearance: 168.6 mL/min (by C-G formula based on Cr of 0.5).    Allergies  Allergen Reactions  . Ketorolac Nausea Only    [facility]  . Tramadol Nausea Only    Per [facility]    Abran DukeLedford, Jericha Bryden 11/01/2015 5:16 AM

## 2015-11-01 NOTE — Progress Notes (Signed)
4 mg Versed given Per Dr. Kendrick FriesMcQuaid. Propofol increased to 65, to stay on for at least 45 min post procedure per Dr. Tyson AliasFeinstein.

## 2015-11-01 NOTE — Progress Notes (Signed)
Orthopaedic Trauma Service Follow Up  CT abd/pelvis reviewed  Concern for L iliacus muscle abscess  Requested review by [facility] radiologist  This was completed by Dr. Pecolia AdesGallerani, greatly appreciated Agrees that there is likely fluid collection present No evidence SI joint involvement Recommend MRI abd/pelvis for further evaluation  May be still amenable to perc drainage by IR   Pt scheduled for OR today for I&D R ankle and possibly sterum (intrathoracic and extrathoracic abscess)  Mearl LatinKeith W. Reggie Bise, PA-C [facility] [phone] (P) 11/01/2015 12:22 PM

## 2015-11-01 NOTE — Care Management Note (Signed)
Case Management Note  Patient Details  Name: Kevin Austin MRN: [id number] Date of Birth: 09-20-1990  Subjective/Objective:   Pt [facility] with pain all over the body. Found to have multiple septic emboli in the lungs, kidneys, dorsum of right hand - possible endocarditis (hx of IV drug use). He was found to be in sinus tachycardia with a temperature 104.6. [facility]ub[facility].                 Action/Plan:  Pt may need discharge to SNF if continued IV antibiotics are required.  CM will continue to monitor for discharge needs   Expected Discharge Date:                  Expected Discharge Plan:  Skilled Nursing Facility  In-House Referral:  Clinical Social Work  Discharge planning Services  CM Consult  Post Acute Care Choice:    Choice offered to:     DME Arranged:    DME Agency:     HH Arranged:    HH Agency:     Status of Service:  In process, will continue to follow  If discussed at Long Length of Stay Meetings, dates discussed:    Additional Comments: 11/01/2015 Pt trached this am.  CM requested LTACH appropriateness determination during LOS 11/01/14;  Pt not considered appropriate as of yet.  CSW following for discharge needs Cherylann ParrClaxton, Shanya Ferriss S, RN 11/01/2015, 10:04 AM

## 2015-11-01 NOTE — Procedures (Signed)
Name:  Kevin Austin MRN:  [id number] DOB:  Sep 15, 1990  OPERATIVE NOTE  Procedure:  Percutaneous tracheostomy.  Indications:  Ventilator-dependent respiratory failure.  Consent:  Procedure, alternatives, risks and benefits discussed with medical POA.  Questions answered.  Consent obtained.  Anesthesia:  Prop drip and pushes ( i pushed), versed, fent, vec  Procedure summary:  Appropriate equipment was assembled.  The patient was identified as Kevin Austin and safety timeout was performed. The patient was placed in supine position with a towel roll behind shoulder blades and neck extended.  Sterile technique was used. The patient's neck and upper chest were prepped using chlorhexidine / alcohol scrub and the field was draped in usual sterile fashion with full body drape. After the adequate sedation / anesthesia was achieved, attention was directed at the midline trachea, where the cricothyroid membrane was palpated. Approximately two fingerbreadths above the sternal notch, a verticle incision was created with a scalpel after local infiltration with 0.2% Lidocaine. Then, using Seldinger technique and a percutaneous tracheostomy set, the trachea was entered with a 14 gauge needle with an overlying sheath. This was all confirmed under direct visualization of a fiberoptic flexible bronchoscope. Entrance into the trachea was identified through the third tracheal ring interspace. Following this, a guidewire was inserted. The needle was removed, leaving the sheath and the guidewire intact. Next, the sheath was removed and a small dilator was inserted. The tracheal rings were then dilated. A #8 Shiley was then opened. The balloon was checked. It was placed over a tracheal dilator, which was then advanced over the guidewire and through the previously dilated tract. The Shiley tracheostomy tube was noted to pass in the trachea with little resistance. The guidewire and dilator tubes were removed from the trachea. An inner  cannula was placed through the tracheostomy tube. The tracheostomy was then secured at the anterior neck with 4 monofilament sutures. The oral endotracheal tube was removed and the ventilator was attached to the newly placed tracheostomy tube. Adequate tidal volumes were noted. The cuff was inflated and no evidence of air leak was noted. No evidence of bleeding was noted. At this point, the procedure was concluded. Post-procedure chest x-ray was ordered.  Complications:  No immediate complications were noted.  Hemodynamic parameters and oxygenation remained stable throughout the procedure.  Estimated blood loss:  Less then 5 mL.  Nelda BucksFEINSTEIN,DANIEL J., MD Pulmonary and Critical Care Medicine [facility] Pager: [phone]  11/01/2015, 9:38 AM   Can follow up in [facility] [phone]

## 2015-11-02 ENCOUNTER — Encounter (HOSPITAL_COMMUNITY): Payer: Self-pay | Admitting: Orthopedic Surgery

## 2015-11-02 ENCOUNTER — Inpatient Hospital Stay (HOSPITAL_COMMUNITY): Payer: Medicaid Other

## 2015-11-02 LAB — GLUCOSE, CAPILLARY
GLUCOSE-CAPILLARY: 81 mg/dL (ref 65–99)
GLUCOSE-CAPILLARY: 86 mg/dL (ref 65–99)
GLUCOSE-CAPILLARY: 118 mg/dL — AB (ref 65–99)
GLUCOSE-CAPILLARY: 111 mg/dL — AB (ref 65–99)
Glucose-Capillary: 96 mg/dL (ref 65–99)
Glucose-Capillary: 93 mg/dL (ref 65–99)

## 2015-11-02 LAB — COMPREHENSIVE METABOLIC PANEL
ALK PHOS: 218 U/L — AB (ref 38–126)
ALT: 18 U/L (ref 17–63)
AST: 32 U/L (ref 15–41)
Albumin: 1.4 g/dL — ABNORMAL LOW (ref 3.5–5.0)
Anion gap: 8 (ref 5–15)
BUN: 9 mg/dL (ref 6–20)
CHLORIDE: 96 mmol/L — AB (ref 101–111)
CO2: 30 mmol/L (ref 22–32)
CREATININE: 0.67 mg/dL (ref 0.61–1.24)
Calcium: 7.6 mg/dL — ABNORMAL LOW (ref 8.9–10.3)
GFR calc non Af Amer: >60 mL/min (ref >60)
Glucose, Bld: 90 mg/dL (ref 65–99)
POTASSIUM: 3.7 mmol/L (ref 3.5–5.1)
SODIUM: 134 mmol/L — AB (ref 135–145)
Total Bilirubin: 1.9 mg/dL — ABNORMAL HIGH (ref 0.3–1.2)
Total Protein: 6.7 g/dL (ref 6.5–8.1)

## 2015-11-02 LAB — CBC WITH DIFFERENTIAL/PLATELET
BASOS ABS: 0 10*3/uL (ref 0.0–0.1)
BASOS PCT: 0 %
EOS PCT: 0 %
Eosinophils Absolute: 0 10*3/uL (ref 0.0–0.7)
HEMATOCRIT: 21.3 % — AB (ref 39.0–52.0)
HEMOGLOBIN: 7 g/dL — AB (ref 13.0–17.0)
LYMPHS PCT: 13 %
Lymphs Abs: 1.7 10*3/uL (ref 0.7–4.0)
MCH: 27.7 pg (ref 26.0–34.0)
MCHC: 32.9 g/dL (ref 30.0–36.0)
MCV: 84.2 fL (ref 78.0–100.0)
MONOS PCT: 6 %
Monocytes Absolute: 0.8 10*3/uL (ref 0.1–1.0)
NEUTROS ABS: 10.4 10*3/uL — AB (ref 1.7–7.7)
Neutrophils Relative %: 81 %
Platelets: 341 10*3/uL (ref 150–400)
RBC: 2.53 MIL/uL — ABNORMAL LOW (ref 4.22–5.81)
RDW: 15.5 % (ref 11.5–15.5)
WBC: 12.9 10*3/uL — ABNORMAL HIGH (ref 4.0–10.5)

## 2015-11-02 LAB — MAGNESIUM: Magnesium: 1.6 mg/dL — ABNORMAL LOW (ref 1.7–2.4)

## 2015-11-02 LAB — HEMOGLOBIN AND HEMATOCRIT, BLOOD
HEMATOCRIT: 21.2 % — AB (ref 39.0–52.0)
HEMOGLOBIN: 6.7 g/dL — AB (ref 13.0–17.0)

## 2015-11-02 LAB — TRIGLYCERIDES: TRIGLYCERIDES: 320 mg/dL — AB (ref <150)

## 2015-11-02 MED ORDER — VITAL HIGH PROTEIN PO LIQD
1000.0000 mL | ORAL | Status: DC
  Administered 2015-11-02: 1000 mL
  Administered 2015-11-03 (×4)
  Administered 2015-11-03: 1000 mL
  Administered 2015-11-03 – 2015-11-04 (×9)
  Filled 2015-11-02 (×2): qty 1000

## 2015-11-02 MED ORDER — MAGNESIUM SULFATE 50 % IJ SOLN
3.0000 g | Freq: Once | INTRAVENOUS | Status: AC
  Administered 2015-11-02: 3 g via INTRAVENOUS
  Filled 2015-11-02: qty 6

## 2015-11-02 MED ORDER — ROCURONIUM BROMIDE 50 MG/5ML IV SOLN
75.0000 mg | Freq: Once | INTRAVENOUS | Status: AC
  Administered 2015-11-02: 75 mg via INTRAVENOUS
  Filled 2015-11-02: qty 7.5

## 2015-11-02 MED ORDER — BISACODYL 10 MG RE SUPP
10.0000 mg | Freq: Once | RECTAL | Status: AC
  Administered 2015-11-02: 10 mg via RECTAL
  Filled 2015-11-02: qty 1

## 2015-11-02 NOTE — Progress Notes (Signed)
1 Day Post-Op Procedure(s) (LRB): IRRIGATION AND DEBRIDEMENT OF ANKLE AND SOFT TISSUE (Right) IRRIGATION AND DEBRIDEMENT OF STERNUM W/ POSSIBLE WOUND VAC PLACEMENT (N/A) Subjective: Intubated and sedated More cooperative with RN when sedation lightened today  Objective: Vital signs in last 24 hours: Temp:  [98.2 F (36.8 C)-101.8 F (38.8 C)] 101.8 F (38.8 C) (07/07 1615) Pulse Rate:  [104-222] 125 (07/07 1612) Cardiac Rhythm:  [-] Sinus tachycardia (07/07 0800) Resp:  [18-30] 20 (07/07 1612) BP: (129-148)/(73-103) 133/79 mmHg (07/07 1612) SpO2:  [99 %-100 %] 100 % (07/07 1612) FiO2 (%):  [40 %] 40 % (07/07 1612) Weight:  [209 lb 3.5 oz (94.9 kg)] 209 lb 3.5 oz (94.9 kg) (07/07 0429)  Hemodynamic parameters for last 24 hours: CVP:  [4 mmHg-7 mmHg] 4 mmHg  Intake/Output from previous day: 07/06 0701 - 07/07 0700 In: 3828.5 [I.V.:2378.5; Blood:330; IV Piggyback:1100] Out: 4150 [Urine:4050; Blood:100] Intake/Output this shift: Total I/O In: 704.7 [I.V.:548.7; IV Piggyback:156] Out: 2050 [Urine:2050]  General appearance: on vent Neurologic: sedated Wound: sternal wound clean with mild bleeding at base  Lab Results:  Recent Labs  11/01/15 1447 11/02/15 0416  WBC 11.9* 12.9*  HGB 7.4* 7.0*  HCT 23.3* 21.3*  PLT 315 341   BMET:  Recent Labs  11/01/15 0355 11/02/15 0416  NA 137 134*  K 3.6 3.7  CL 98* 96*  CO2 32 30  GLUCOSE 85 90  BUN 9 9  CREATININE 0.50* 0.67  CALCIUM 8.1* 7.6*    PT/INR:  Recent Labs  10/31/15 1106  LABPROT 14.7  INR 1.13   ABG    Component Value Date/Time   PHART 7.446 10/29/2015 0823   HCO3 37.7* 10/29/2015 0823   TCO2 39 10/29/2015 0823   ACIDBASEDEF 1.0 10/22/2015 1832   O2SAT 100.0 10/29/2015 0823   CBG (last 3)   Recent Labs  11/02/15 0806 11/02/15 1134 11/02/15 1532  GLUCAP 86 93 118*    Assessment/Plan: S/P Procedure(s) (LRB): IRRIGATION AND DEBRIDEMENT OF ANKLE AND SOFT TISSUE (Right) IRRIGATION AND  DEBRIDEMENT OF STERNUM W/ POSSIBLE WOUND VAC PLACEMENT (N/A) --  Dressing changed after giving 2 mg versed IV and a bolus of fentanyl Tolerated well Needs daily dressing changes with NS wet to dry   LOS: 11 days    Loreli SlotSteven C Sabine Tenenbaum 11/02/2015

## 2015-11-02 NOTE — Progress Notes (Signed)
CSW continues to follow for substance abuse assessment/resources and SNF placement once medically appropriate.          Lance MussAshley Gardner,MSW, LCSW [facility] Clinical Social Worker [phone]

## 2015-11-02 NOTE — Progress Notes (Signed)
Physical Therapy Wound Treatment Patient Details  Name: Kevin Austin MRN: [id number] Date of Birth: 1990/07/26  Today's Date: 11/02/2015 Time: 7017-7939 Time Calculation (min): 68 min  Subjective  Subjective: Pt sedated during hydro session Patient and Family Stated Goals: None stated Prior Treatments: I&D 6/27  Pain Score: Pt sedated during session. Minimal grimacing during session.   Wound Assessment  Wound / Incision (Open or Dehisced) 10/25/15 Incision - Open Hand Right Dorsal aspect - Ulnar side (Active)  Dressing Type Compression wrap;Gauze (Comment);ABD;Moist to dry 11/02/2015 12:00 PM  Dressing Changed Changed 11/02/2015 12:00 PM  Dressing Status Clean;Dry;Intact 11/02/2015 12:00 PM  Dressing Change Frequency Daily 11/02/2015 12:00 PM  Site / Wound Assessment Red;Painful;Yellow 11/02/2015 12:00 PM  % Wound base Red or Granulating 90% 11/02/2015 12:00 PM  % Wound base Yellow 10% 11/02/2015 12:00 PM  % Wound base Black 0% 11/02/2015 12:00 PM  % Wound base Other (Comment) 0% 11/02/2015 12:00 PM  Peri-wound Assessment Intact;Erythema (blanchable) 11/02/2015 12:00 PM  Wound Length (cm) 6.5 cm 10/25/2015  9:37 AM  Wound Width (cm) 1.5 cm 10/25/2015  9:37 AM  Wound Depth (cm) 1.2 cm 10/25/2015  9:37 AM  Undermining (cm) 3-1 o'clock 2.9 cm, 7.-10 o'clock 4.2 cm (connecting), 11-12 o'clock 3.5 cm 10/25/2015  9:37 AM  Margins Unattached edges (unapproximated) 11/02/2015 12:00 PM  Closure None 11/02/2015 12:00 PM  Drainage Amount Minimal 11/02/2015 12:00 PM  Drainage Description Serosanguineous 11/02/2015 12:00 PM  Non-staged Wound Description Not applicable 0/06/90 33:00 PM  Treatment Hydrotherapy (Pulse lavage);Packing (Impregnated strip) 11/02/2015 12:00 PM     Wound / Incision (Open or Dehisced) 10/25/15 Incision - Open Hand Right Dorsal aspect - Radial side (Active)  Dressing Type Compression wrap;Gauze (Comment);Moist to dry 11/02/2015 12:00 PM  Dressing Changed Changed 11/02/2015 12:00 PM  Dressing Status  Clean;Dry;Intact 11/02/2015 12:00 PM  Dressing Change Frequency Daily 11/02/2015 12:00 PM  Site / Wound Assessment Red 11/02/2015 12:00 PM  % Wound base Red or Granulating 100% 11/02/2015 12:00 PM  % Wound base Yellow 0% 11/02/2015 12:00 PM  % Wound base Black 0% 11/02/2015 12:00 PM  % Wound base Other (Comment) 0% 11/02/2015 12:00 PM  Peri-wound Assessment Intact;Edema;Erythema (blanchable) 11/02/2015 12:00 PM  Wound Length (cm) 5 cm 10/25/2015  9:37 AM  Wound Width (cm) 1 cm 10/25/2015  9:37 AM  Wound Depth (cm) 1.2 cm 10/25/2015  9:37 AM  Undermining (cm) 1-5 o'clock 4.5 cm (connecting), 9 o'clock 1.6 cm  10/25/2015  9:37 AM  Margins Unattached edges (unapproximated) 11/02/2015 12:00 PM  Closure None 11/02/2015 12:00 PM  Drainage Amount Minimal 11/02/2015 12:00 PM  Drainage Description Serosanguineous 11/02/2015 12:00 PM  Non-staged Wound Description Not applicable 10/31/2261 33:54 PM  Treatment Hydrotherapy (Pulse lavage);Packing (Impregnated strip) 11/02/2015 12:00 PM     Wound / Incision (Open or Dehisced) 10/25/15 Incision - Open Hand Left Dorsal aspect (Active)  Dressing Type Compression wrap;Gauze (Comment);Moist to dry 11/02/2015  1:00 PM  Dressing Changed Changed 11/02/2015 12:00 PM  Dressing Status Clean;Dry;Intact 11/02/2015  1:00 PM  Dressing Change Frequency Daily 11/02/2015  1:00 PM  Site / Wound Assessment Pink;Yellow 11/02/2015  1:00 PM  % Wound base Red or Granulating 95% 11/02/2015  1:00 PM  % Wound base Yellow 5% 11/02/2015  1:00 PM  % Wound base Black 0% 11/02/2015  1:00 PM  % Wound base Other (Comment) 0% 11/02/2015  1:00 PM  Peri-wound Assessment Intact;Edema;Erythema (blanchable) 11/02/2015  1:00 PM  Wound Length (cm) 4 cm 10/25/2015  9:37 AM  Wound Width (cm) 0.8 cm 10/25/2015  9:37 AM  Wound Depth (cm) 1.5 cm 10/25/2015  9:37 AM  Undermining (cm) 2-4 o'clock 2.7 cm, 10 o'clock 2.1 cm 10/25/2015  9:37 AM  Margins Unattached edges (unapproximated) 11/02/2015  1:00 PM  Closure None 11/02/2015  1:00 PM  Drainage  Amount Minimal 11/02/2015  1:00 PM  Drainage Description Serosanguineous 11/02/2015  1:00 PM  Non-staged Wound Description Not applicable 0/0/3491  7:91 PM  Treatment Hydrotherapy (Pulse lavage);Packing (Impregnated strip) 11/02/2015  1:00 PM     Wound / Incision (Open or Dehisced) 10/25/15 Incision - Open Hand Left Palmar aspect (Active)  Dressing Type Compression wrap;Gauze (Comment);Moist to dry 11/02/2015  1:00 PM  Dressing Changed Changed 11/02/2015  1:00 PM  Dressing Status Clean;Dry;Intact 11/02/2015  1:00 PM  Dressing Change Frequency Daily 11/02/2015  1:00 PM  Site / Wound Assessment Red 11/02/2015  1:00 PM  % Wound base Red or Granulating 100% 11/02/2015  1:00 PM  % Wound base Yellow 0% 11/02/2015  1:00 PM  % Wound base Black 0% 11/02/2015  1:00 PM  % Wound base Other (Comment) 0% 11/02/2015  1:00 PM  Peri-wound Assessment Intact;Maceration 11/02/2015  1:00 PM  Wound Length (cm) 0.3 cm 10/25/2015  9:37 AM  Wound Width (cm) 4.1 cm 10/25/2015  9:37 AM  Wound Depth (cm) 1.8 cm 10/25/2015  9:37 AM  Undermining (cm) 7 o'clock 3.0 cm, 9 o'clock 2.2 cm, 1 o'clock .7 cm, 3-6 o'clock 1.2 cm 10/25/2015  9:37 AM  Margins Unattached edges (unapproximated) 11/02/2015  1:00 PM  Closure None 11/02/2015  1:00 PM  Drainage Amount Minimal 11/02/2015  1:00 PM  Drainage Description Serosanguineous 11/02/2015  1:00 PM  Non-staged Wound Description Not applicable 5/0/5697  9:48 PM  Treatment Hydrotherapy (Pulse lavage);Packing (Impregnated strip) 11/02/2015  1:00 PM   Hydrotherapy Pulsed lavage therapy - wound location: Bilateral hands Pulsed Lavage with Suction (psi): 12 psi Pulsed Lavage with Suction - Normal Saline Used: 1000 mL (Between all wounds) Pulsed Lavage Tip: Tunneling tip   Wound Assessment and Plan  Wound Therapy - Assess/Plan/Recommendations Wound Therapy - Clinical Statement: PROM to fingers and wrist. Noted some dark yellow drainage on the R ulnar wound.  Wound Therapy - Functional Problem List: Decreased  strength/AROM of hands/fingers for ADL's and fine motor tasks.  Factors Delaying/Impairing Wound Healing: Substance abuse;Multiple medical problems Hydrotherapy Plan: Debridement;Dressing change;Patient/family education;Pulsatile lavage with suction Wound Therapy - Frequency: 6X / week Wound Therapy - Follow Up Recommendations: Other (comment) (LTAC vs SNF) Wound Plan: See above  Wound Therapy Goals- Improve the function of patient's integumentary system by progressing the wound(s) through the phases of wound healing (inflammation - proliferation - remodeling) by: Patient/Family will be able to : complete dressing changes independently prior to d/c Patient/Family Instruction Goal - Progress: Not progressing Goals/treatment plan/discharge plan were made with and agreed upon by patient/family: No, Patient unable to participate in goals/treatment/discharge plan and family unavailable Time For Goal Achievement: 7 days Wound Therapy - Potential for Goals: Good  Goals will be updated until maximal potential achieved or discharge criteria met.  Discharge criteria: when goals achieved, discharge from hospital, MD decision/surgical intervention, no progress towards goals, refusal/missing three consecutive treatments without notification or medical reason.  GP     Rolinda Roan 11/02/2015, 1:50 PM   Rolinda Roan, PT, DPT Acute Rehabilitation Services Pager: [phone]

## 2015-11-02 NOTE — Progress Notes (Signed)
CRITICAL VALUE ALERT  Critical value received:  Hemoglobin 6.7  Date of notification:  11/02/15  Time of notification:  1847  Critical value read back:Yes.    Nurse who received alert:  Tory EmeraldMichelle Deaira Leckey, RN  MD notified (1st page):  Dr Vassie LollAlva  Time of first page:  1847  MD notified (2nd page):  Time of second page:  Responding MD:  Dr Vassie LollAlva  Time MD responded:  [phone]

## 2015-11-02 NOTE — Progress Notes (Signed)
Pt tolerated Cortrak placement well. Tube was inserted into left nare to 81cm. Xray has been ordered and feeding tube has been added to assessment. Please contact Cortrak team with any questions or concerns.

## 2015-11-02 NOTE — Progress Notes (Signed)
Nutrition Follow-up  DOCUMENTATION CODES:   Not applicable  INTERVENTION:    Resume TF via Cortrak tube with Vital High Protein at new goal rate of 70 ml/h (1680 ml per day) to provide 1680 kcal, 147 gm protein, 1404 ml free water daily  Total calorie intake with TF + Propofol will be 2443 kcal (103% of estimated needs)  NUTRITION DIAGNOSIS:   Inadequate oral intake related to inability to eat as evidenced by NPO status.  Ongoing  GOAL:   Patient will meet greater than or equal to 90% of their needs  Progressing.  MONITOR:   Vent status, Labs, Weight trends, TF tolerance, Skin, I & O's  REASON FOR ASSESSMENT:   Consult Enteral/tube feeding initiation and management  ASSESSMENT:   25 year old admitted with severe sepsis, multiple septic emboli. 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   6/27 self-extubated 6/27 I&D bil hands and left foot in OR; remained on vent post-op 7/01 I&D right foot 7/02 self-extubated 7/03 re-intubated 7/06 trach placed 7/07 Cortrak placed (tip in stomach)  Labs reviewed: sodium and magnesium low. Medications reviewed. Discussed patient in ICU rounds and with RN today. Another potential abscess has been found, plans for MRI of hip to evaluate. Patient requiring some ventilator support. On trach collar some this morning. MV: 12.3 L/min Temp (24hrs), Avg:98.9 F (37.2 C), Min:98.2 F (36.8 C), Max:99.3 F (37.4 C)  Propofol: 28.9 ml/hr providing 763 kcals from lipids per day.   Diet Order:  Diet NPO time specified  Skin:  Wound (see comment) (abscesses to foot and hand, s/p I&D 6/27)  Last BM:  PTA  Height:   Ht Readings from Last 1 Encounters:  10/22/15 5\' 11"  (1.803 m)    Weight:   Wt Readings from Last 1 Encounters:  11/02/15 209 lb 3.5 oz (94.9 kg)    Ideal Body Weight:  78.2 kg  BMI:  Body mass index is 29.19 kg/(m^2).  Estimated Nutritional Needs:   Kcal:  2380  Protein:  140-160 gm  Fluid:   2.5 L  EDUCATION NEEDS:   No education needs identified at this time  Joaquin CourtsKimberly Harris, RD, LDN, CNSC Pager [phone] After Hours Pager [phone]

## 2015-11-02 NOTE — Progress Notes (Signed)
[facility] for Infectious Disease    Date of Admission:  10/22/2015   Total days of antibiotics 12 Day 12 vancomycin  Principal Problem:   MRSA bacteremia Active Problems:   Acute respiratory failure (HCC)   Abscess   Altered mental status   Encounter for central line placement   Bilateral pneumonia   Sternal osteomyelitis ([place])   HCAP (healthcare-associated pneumonia)   Acute pulmonary edema ([place])   . antiseptic oral rinse  7 mL Mouth Rinse QID  . chlorhexidine gluconate (SAGE KIT)  15 mL Mouth Rinse BID  . famotidine (PEPCID) IV  20 mg Intravenous Q12H  . feeding supplement (VITAL HIGH PROTEIN)  1,000 mL Per Tube Q24H  . furosemide  20 mg Intravenous Q12H  . heparin  5,000 Units Subcutaneous Q8H  . methadone  20 mg Per Tube Q8H  . rocuronium  75 mg Intravenous Once  . sodium chloride  1,000 mL Intravenous Once  . vancomycin  1,500 mg Intravenous Q12H    SUBJECTIVE: He is intubated and sedated, and follows basic commands.  Review of Systems: Review of Systems  Unable to perform ROS: intubated    History reviewed. No pertinent past medical history.  Social History  Substance Use Topics  . Smoking status: None  . Smokeless tobacco: None  . Alcohol Use: None    History reviewed. No pertinent family history. Allergies  Allergen Reactions  . Ketorolac Nausea Only    Per [facility] records  . Tramadol Nausea Only    Per [facility] records    OBJECTIVE: Filed Vitals:   11/02/15 0900 11/02/15 1000 11/02/15 1100 11/02/15 1135  BP: 143/91 134/89 136/85   Pulse: 116 108 104   Temp:    99.3 F (37.4 C)  TempSrc:    Oral  Resp:      Height:      Weight:      SpO2: 100% 100% 100%    Body mass index is 29.19 kg/(m^2).  Physical Exam  Eyes: Conjunctivae are normal.  Cardiovascular: Normal rate and regular rhythm.   Pulmonary/Chest:  Bilateral crackles, wound  dressing in place over sternum  Musculoskeletal:  2+ pitting edema of lower extremities below the knees b/l, feet in ACE wraps, hand surgical incisions no longer tracking together beneath fascia on his R hand  Neurological:  Heavily sedated during wound care, unable to follow commands  Skin:  Diffuse folliculitis on chest, arms, and legs   Lab Results Lab Results  Component Value Date   WBC 12.9* 11/02/2015   HGB 7.0* 11/02/2015   HCT 21.3* 11/02/2015   MCV 84.2 11/02/2015   PLT 341 11/02/2015    Lab Results  Component Value Date   CREATININE 0.67 11/02/2015   BUN 9 11/02/2015   NA 134* 11/02/2015   K 3.7 11/02/2015   CL 96* 11/02/2015   CO2 30 11/02/2015    Lab Results  Component Value Date   ALT 18 11/02/2015   AST 32 11/02/2015   ALKPHOS 218* 11/02/2015   BILITOT 1.9* 11/02/2015     Microbiology: Recent Results (from the past 240 hour(s))  Fungus Culture With Stain (Not @ [facility])     Status: None (Preliminary result)   Collection Time: 10/23/15  7:09 PM  Result Value Ref Range Status   Fungus Stain Final report  Final    Comment: (NOTE) Performed At: [facility] [place], [place] [id number] Lindon Romp MD YD:[id number]  Fungus (Mycology) Culture PENDING  Incomplete   Fungal Source ABSCESS  Final    Comment: RIGHT HAND   Aerobic/Anaerobic Culture (surgical/deep wound)     Status: None   Collection Time: 10/23/15  7:09 PM  Result Value Ref Range Status   Specimen Description ABSCESS HAND RIGHT  Final   Special Requests PT ON VANC FORTAZ ZOSYN  Final   Gram Stain   Final    MODERATE WBC PRESENT,BOTH PMN AND MONONUCLEAR FEW GRAM POSITIVE COCCI IN PAIRS IN CLUSTERS    Culture   Final    MODERATE METHICILLIN RESISTANT STAPHYLOCOCCUS AUREUS NO ANAEROBES ISOLATED    Report Status 10/28/2015 FINAL  Final   Organism ID, Bacteria METHICILLIN RESISTANT STAPHYLOCOCCUS AUREUS  Final      Susceptibility   Methicillin resistant  staphylococcus aureus - MIC*    CIPROFLOXACIN <=0.5 SENSITIVE Sensitive     ERYTHROMYCIN >=8 RESISTANT Resistant     GENTAMICIN <=0.5 SENSITIVE Sensitive     OXACILLIN >=4 RESISTANT Resistant     TETRACYCLINE <=1 SENSITIVE Sensitive     VANCOMYCIN <=0.5 SENSITIVE Sensitive     TRIMETH/SULFA <=10 SENSITIVE Sensitive     CLINDAMYCIN <=0.25 SENSITIVE Sensitive     RIFAMPIN <=0.5 SENSITIVE Sensitive     Inducible Clindamycin NEGATIVE Sensitive     * MODERATE METHICILLIN RESISTANT STAPHYLOCOCCUS AUREUS  Fungus Culture Result     Status: None   Collection Time: 10/23/15  7:09 PM  Result Value Ref Range Status   Result 1 Comment  Final    Comment: (NOTE) KOH/Calcofluor preparation:  no fungus observed. Performed At: [facility] [place], [place] [id number] Lindon Romp MD VF:[id number]   Aerobic/Anaerobic Culture (surgical/deep wound)     Status: None   Collection Time: 10/23/15  7:17 PM  Result Value Ref Range Status   Specimen Description ABSCESS LEFT FOOT  Final   Special Requests PT ON VANC FORTAZ ZOSYN  Final   Gram Stain   Final    MODERATE WBC PRESENT,BOTH PMN AND MONONUCLEAR FEW GRAM POSITIVE COCCI IN CLUSTERS    Culture   Final    FEW METHICILLIN RESISTANT STAPHYLOCOCCUS AUREUS NO ANAEROBES ISOLATED    Report Status 10/28/2015 FINAL  Final   Organism ID, Bacteria METHICILLIN RESISTANT STAPHYLOCOCCUS AUREUS  Final      Susceptibility   Methicillin resistant staphylococcus aureus - MIC*    CIPROFLOXACIN <=0.5 SENSITIVE Sensitive     ERYTHROMYCIN >=8 RESISTANT Resistant     GENTAMICIN <=0.5 SENSITIVE Sensitive     OXACILLIN >=4 RESISTANT Resistant     TETRACYCLINE <=1 SENSITIVE Sensitive     VANCOMYCIN <=0.5 SENSITIVE Sensitive     TRIMETH/SULFA <=10 SENSITIVE Sensitive     CLINDAMYCIN <=0.25 SENSITIVE Sensitive     RIFAMPIN <=0.5 SENSITIVE Sensitive     Inducible Clindamycin NEGATIVE Sensitive     * FEW METHICILLIN RESISTANT STAPHYLOCOCCUS  AUREUS  Culture, respiratory (NON-Expectorated)     Status: None   Collection Time: 10/24/15  3:29 AM  Result Value Ref Range Status   Specimen Description TRACHEAL ASPIRATE  Final   Special Requests Normal  Final   Gram Stain   Final    ABUNDANT WBC PRESENT, PREDOMINANTLY PMN RARE SQUAMOUS EPITHELIAL CELLS PRESENT RARE GRAM POSITIVE COCCI IN CLUSTERS    Culture FEW METHICILLIN RESISTANT STAPHYLOCOCCUS AUREUS  Final   Report Status 10/26/2015 FINAL  Final   Organism ID, Bacteria METHICILLIN RESISTANT STAPHYLOCOCCUS AUREUS  Final      Susceptibility  Methicillin resistant staphylococcus aureus - MIC*    CIPROFLOXACIN <=0.5 SENSITIVE Sensitive     ERYTHROMYCIN >=8 RESISTANT Resistant     GENTAMICIN <=0.5 SENSITIVE Sensitive     OXACILLIN >=4 RESISTANT Resistant     TETRACYCLINE <=1 SENSITIVE Sensitive     VANCOMYCIN 1 SENSITIVE Sensitive     TRIMETH/SULFA <=10 SENSITIVE Sensitive     CLINDAMYCIN <=0.25 SENSITIVE Sensitive     RIFAMPIN <=0.5 SENSITIVE Sensitive     Inducible Clindamycin NEGATIVE Sensitive     * FEW METHICILLIN RESISTANT STAPHYLOCOCCUS AUREUS  Culture, blood (routine x 2)     Status: None   Collection Time: 10/24/15  1:51 PM  Result Value Ref Range Status   Specimen Description BLOOD RIGHT ARM  Final   Special Requests BOTTLES DRAWN AEROBIC AND ANAEROBIC  [place]   Final   Culture NO GROWTH 5 DAYS  Final   Report Status 10/29/2015 FINAL  Final  Culture, blood (routine x 2)     Status: None   Collection Time: 10/24/15  2:05 PM  Result Value Ref Range Status   Specimen Description BLOOD RIGHT ARM  Final   Special Requests BOTTLES DRAWN AEROBIC AND ANAEROBIC 10CC   Final   Culture NO GROWTH 5 DAYS  Final   Report Status 10/29/2015 FINAL  Final  Blood Culture ID Panel (Reflexed)     Status: Abnormal   Collection Time: 10/24/15  2:05 PM  Result Value Ref Range Status   Enterococcus species NOT DETECTED NOT DETECTED Final   Vancomycin resistance NOT DETECTED NOT  DETECTED Final   Listeria monocytogenes NOT DETECTED NOT DETECTED Final   Staphylococcus species DETECTED (A) NOT DETECTED Final    Comment: CRITICAL RESULT CALLED TO, READ BACK BY AND VERIFIED WITH: DR Baxter Flattery 10/24/15 @ 1607 M VESTAL    Staphylococcus aureus DETECTED (A) NOT DETECTED Final    Comment: CRITICAL RESULT CALLED TO, READ BACK BY AND VERIFIED WITH: DR Baxter Flattery 10/24/15 @ 1607 M VESTAL    Methicillin resistance NOT DETECTED NOT DETECTED Final   Streptococcus species NOT DETECTED NOT DETECTED Final   Streptococcus agalactiae NOT DETECTED NOT DETECTED Final   Streptococcus pneumoniae NOT DETECTED NOT DETECTED Final   Streptococcus pyogenes NOT DETECTED NOT DETECTED Final   Acinetobacter baumannii NOT DETECTED NOT DETECTED Final   Enterobacteriaceae species NOT DETECTED NOT DETECTED Final   Enterobacter cloacae complex NOT DETECTED NOT DETECTED Final   Escherichia coli NOT DETECTED NOT DETECTED Final   Klebsiella oxytoca NOT DETECTED NOT DETECTED Final   Klebsiella pneumoniae NOT DETECTED NOT DETECTED Final   Proteus species NOT DETECTED NOT DETECTED Final   Serratia marcescens NOT DETECTED NOT DETECTED Final   Carbapenem resistance NOT DETECTED NOT DETECTED Final   Haemophilus influenzae NOT DETECTED NOT DETECTED Final   Neisseria meningitidis NOT DETECTED NOT DETECTED Final   Pseudomonas aeruginosa NOT DETECTED NOT DETECTED Final   Candida albicans NOT DETECTED NOT DETECTED Final   Candida glabrata NOT DETECTED NOT DETECTED Final   Candida krusei NOT DETECTED NOT DETECTED Final   Candida parapsilosis NOT DETECTED NOT DETECTED Final   Candida tropicalis NOT DETECTED NOT DETECTED Final  Culture, blood (routine x 2)     Status: None   Collection Time: 10/26/15  3:00 PM  Result Value Ref Range Status   Specimen Description BLOOD RIGHT ANTECUBITAL  Final   Special Requests BOTTLES DRAWN AEROBIC ONLY 5CC  Final   Culture NO GROWTH 5 DAYS  Final   Report Status  10/31/2015 FINAL   Final  Culture, blood (routine x 2)     Status: None   Collection Time: 10/26/15  3:07 PM  Result Value Ref Range Status   Specimen Description BLOOD RIGHT ANTECUBITAL  Final   Special Requests BOTTLES DRAWN AEROBIC ONLY 5CC  Final   Culture NO GROWTH 5 DAYS  Final   Report Status 10/31/2015 FINAL  Final  Body fluid culture     Status: None   Collection Time: 10/27/15  1:57 PM  Result Value Ref Range Status   Specimen Description FLUID ANKLE  Final   Special Requests Immunocompromised  Final   Gram Stain   Final    ABUNDANT WBC PRESENT, PREDOMINANTLY PMN MODERATE GRAM POSITIVE COCCI IN CLUSTERS    Culture   Final    MODERATE METHICILLIN RESISTANT STAPHYLOCOCCUS AUREUS   Report Status 10/30/2015 FINAL  Final   Organism ID, Bacteria METHICILLIN RESISTANT STAPHYLOCOCCUS AUREUS  Final      Susceptibility   Methicillin resistant staphylococcus aureus - MIC*    CIPROFLOXACIN <=0.5 SENSITIVE Sensitive     ERYTHROMYCIN >=8 RESISTANT Resistant     GENTAMICIN <=0.5 SENSITIVE Sensitive     OXACILLIN >=4 RESISTANT Resistant     TETRACYCLINE <=1 SENSITIVE Sensitive     VANCOMYCIN <=0.5 SENSITIVE Sensitive     TRIMETH/SULFA <=10 SENSITIVE Sensitive     CLINDAMYCIN <=0.25 SENSITIVE Sensitive     RIFAMPIN <=0.5 SENSITIVE Sensitive     Inducible Clindamycin NEGATIVE Sensitive     * MODERATE METHICILLIN RESISTANT STAPHYLOCOCCUS AUREUS  Aerobic/Anaerobic Culture (surgical/deep wound)     Status: None (Preliminary result)   Collection Time: 11/01/15  4:39 PM  Result Value Ref Range Status   Specimen Description ABSCESS  Final   Special Requests LEFT STERNUM PATIENT ON FOLLOWING VANC  Final   Gram Stain   Final    ABUNDANT WBC PRESENT,BOTH PMN AND MONONUCLEAR FEW GRAM POSITIVE COCCI IN CLUSTERS    Culture CULTURE REINCUBATED FOR BETTER GROWTH  Final   Report Status PENDING  Incomplete     ASSESSMENT: Disseminated MRSA bacteremia: Mr. Schoon infection is complicated by multiple abscess  formation in bilateral extremities, at sternum, and now possibly in iliopsoas on abdominal CT. He has undergone I&D drainage of his right ankle again today as well as the sternomanubrial joint with surrounding phlegmon seen on MRI yesterday. He is showing signs of wound healing and a deceasing fever curve so hopefully we will see sustained improvement with less local areas of wall off purulence remaining. Agree with IR evaluation of possible iliopsoas abscess given there is disseminated MRSA that could involve anywhere.  PLAN: 1. Continue vancomycin  Collier Salina, MD PGY-II Internal Medicine Resident Pager# [phone] 11/02/2015, 12:01 PM

## 2015-11-02 NOTE — Progress Notes (Signed)
eLink Physician-Brief Progress Note Patient Name: Kevin Austin DOB: Sep 17, 1990 MRN: [id number]   Date of Service  11/02/2015  HPI/Events of Note  Hb 6.7  eICU Interventions  1 U PRBC     Intervention Category Intermediate Interventions: Bleeding - evaluation and treatment with blood products  ALVA,RAKESH V. 11/02/2015, 6:48 PM

## 2015-11-02 NOTE — Anesthesia Postprocedure Evaluation (Addendum)
Anesthesia Post Note  Patient: Kevin Austin  Procedure(s) Performed: Procedure(s) (LRB): IRRIGATION AND DEBRIDEMENT OF ANKLE AND SOFT TISSUE (Right) IRRIGATION AND DEBRIDEMENT OF STERNUM W/ POSSIBLE WOUND VAC PLACEMENT (N/A)  Patient location during evaluation: SICU Anesthesia Type: General Level of consciousness: sedated Pain management: pain level controlled Vital Signs Assessment: post-procedure vital signs reviewed and stable Respiratory status: patient on ventilator - see flowsheet for VS Cardiovascular status: stable Anesthetic complications: no            Reino KentJudd, Naol Ontiveros J

## 2015-11-02 NOTE — Progress Notes (Addendum)
At approximately 04:15 this morning, patient became very agitated kicking his legs and beating both hands on the side rails despite being on a continuous fentanyl infusion at 400 mcg/hr, plus a 50 mcg bolus, Propofol at 50 mcg/kg/hr, and had just received mg Versed push. During kicking fit patient was able to wrap Hemovac drain from posterior RLE around his L-foot and pull apart/tear drain tubing. Remaining tubing coming from RLE cleaned with chlorhexidine and clamped. Additional mg Versed push and mg Haldol given for agitation. CCMD Dr Donette LarrySomer notified, Dr Laneta SimmersBartle w/ vascular notified, Dr Lajoyce Cornersuda on call for ortho notified, and Dr Wandra Feinstein Murphy was attempted and voicemail left to return call.
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
## 2015-11-02 NOTE — Progress Notes (Signed)
   Assessment: S/P Procedure(s) (LRB): IRRIGATION AND DEBRIDEMENT FOOT (Left) and Right Left Foot on 10/24/15 and Right Ankle 11/01/15 by Dr. Marcial Pacasimothy D. Eulah PontMurphy   Principal Problem:   MRSA bacteremia Active Problems:   Acute respiratory failure (HCC)   Abscess   Altered mental status   Encounter for central line placement   Bilateral pneumonia   Sternal osteomyelitis (HCC)   HCAP (healthcare-associated pneumonia)   Acute pulmonary edema (HCC)  Disseminated MRSA bacteremia   Right Foot/Ankle: warm, expected post op swelling, no purulence.  Patient agitated overnight and pulled Hemovac drain from apparatus.  Minimal drainage postoperatively.  This was pulled today.  New gauze, kerlix, ace placed.  Manipulation of ankle does not illicit pain response.  Mild response to pulling drains.  Left Foot.  Surgical site/wound looks good.  Loose suture in place - center of would open to drain.  Scant serous drainage.  Left ankle palpation and passive ROM does not illicit pain response.  Non-erythematous. Left knee and thigh without swelling/erythema.   S/p multiple I&D's of extremities and sternum.    Plan: Continue Abx and plan per CCM. Left Foot: Loose suture in place to allow drainage.   B/L Feet: Continue pressure gauze dressing / kerlix / ace wrap.  Continue to follow extremities for new/worsening infection.    Weight Bearing: Weight Bearing as Tolerated (WBAT) Dressings: B/L Feet/Ankles Gauze / Kerlix / Ace wrap Daily.  Left foot remaining Sutures to be removed in ~10-14 days post op. VTE prophylaxis: on sq heparin Dispo: per primary  Subjective: Intubated, sedated.   Objective:   VITALS:   Filed Vitals:   11/02/15 0435 11/02/15 0500 11/02/15 0600 11/02/15 0700  BP: 143/87 142/100 142/96 145/103  Pulse: 115 105 104 107  Temp:      TempSrc:      Resp:      Height:      Weight:      SpO2: 100% 100% 100% 100%    Lab Results  Component Value Date   WBC 12.9* 11/02/2015   HGB 7.0* 11/02/2015   HCT 21.3* 11/02/2015   MCV 84.2 11/02/2015   PLT 341 11/02/2015    Physical Exam General: NAD.  Intubated, sedated  MSK:  -Left Foot:  Intact pulses distally.  Foot warm.   Surgical site/wound looks good.  Loose suture in place - center of would open to drain.  Scant serous drainage.  Left ankle palpation and passive ROM does not illicit pain response.  Non-erythematous.  -Right foot/ankle s/p I/D 11/01/15 w/ expected swelling.  Mild Pain w/ manipulation of Hemovac. No purulence.  Lucretia KernHenry Calvin Martensen III 11/02/2015, 8:12 AM

## 2015-11-03 LAB — CBC WITH DIFFERENTIAL/PLATELET
BASOS PCT: 0 %
Basophils Absolute: 0 10*3/uL (ref 0.0–0.1)
EOS ABS: 0.1 10*3/uL (ref 0.0–0.7)
Eosinophils Relative: 1 %
HCT: 25.8 % — ABNORMAL LOW (ref 39.0–52.0)
HEMOGLOBIN: 8.2 g/dL — AB (ref 13.0–17.0)
LYMPHS ABS: 1.7 10*3/uL (ref 0.7–4.0)
Lymphocytes Relative: 13 %
MCH: 27.2 pg (ref 26.0–34.0)
MCHC: 31.8 g/dL (ref 30.0–36.0)
MCV: 85.4 fL (ref 78.0–100.0)
MONO ABS: 1 10*3/uL (ref 0.1–1.0)
MONOS PCT: 8 %
NEUTROS ABS: 10.2 10*3/uL — AB (ref 1.7–7.7)
Neutrophils Relative %: 78 %
Platelets: 362 10*3/uL (ref 150–400)
RBC: 3.02 MIL/uL — ABNORMAL LOW (ref 4.22–5.81)
RDW: 15 % (ref 11.5–15.5)
WBC: 13 10*3/uL — AB (ref 4.0–10.5)

## 2015-11-03 LAB — COMPREHENSIVE METABOLIC PANEL
ALBUMIN: 1.4 g/dL — AB (ref 3.5–5.0)
ALK PHOS: 190 U/L — AB (ref 38–126)
ALT: 19 U/L (ref 17–63)
ANION GAP: 6 (ref 5–15)
AST: 33 U/L (ref 15–41)
BILIRUBIN TOTAL: 1.5 mg/dL — AB (ref 0.3–1.2)
BUN: 8 mg/dL (ref 6–20)
CALCIUM: 8.1 mg/dL — AB (ref 8.9–10.3)
CO2: 33 mmol/L — AB (ref 22–32)
Chloride: 96 mmol/L — ABNORMAL LOW (ref 101–111)
Creatinine, Ser: 0.49 mg/dL — ABNORMAL LOW (ref 0.61–1.24)
GFR calc non Af Amer: >60 mL/min (ref >60)
GLUCOSE: 118 mg/dL — AB (ref 65–99)
POTASSIUM: 3.5 mmol/L (ref 3.5–5.1)
SODIUM: 135 mmol/L (ref 135–145)
TOTAL PROTEIN: 7.1 g/dL (ref 6.5–8.1)

## 2015-11-03 LAB — TYPE AND SCREEN
ABO/RH(D): A NEG
ANTIBODY SCREEN: NEGATIVE
UNIT DIVISION: 0
Unit division: 0
Unit division: 0

## 2015-11-03 LAB — GLUCOSE, CAPILLARY
GLUCOSE-CAPILLARY: 108 mg/dL — AB (ref 65–99)
GLUCOSE-CAPILLARY: 109 mg/dL — AB (ref 65–99)
GLUCOSE-CAPILLARY: 134 mg/dL — AB (ref 65–99)
Glucose-Capillary: 110 mg/dL — ABNORMAL HIGH (ref 65–99)
Glucose-Capillary: 112 mg/dL — ABNORMAL HIGH (ref 65–99)
Glucose-Capillary: 115 mg/dL — ABNORMAL HIGH (ref 65–99)

## 2015-11-03 LAB — TRIGLYCERIDES: TRIGLYCERIDES: 324 mg/dL — AB (ref <150)

## 2015-11-03 LAB — VANCOMYCIN, TROUGH: VANCOMYCIN TR: 10 ug/mL — AB (ref 15–20)

## 2015-11-03 LAB — MAGNESIUM: Magnesium: 1.8 mg/dL (ref 1.7–2.4)

## 2015-11-03 MED ORDER — VANCOMYCIN HCL 10 G IV SOLR
1750.0000 mg | Freq: Two times a day (BID) | INTRAVENOUS | Status: DC
  Administered 2015-11-04 – 2015-11-05 (×4): 1750 mg via INTRAVENOUS
  Filled 2015-11-03 (×5): qty 1750

## 2015-11-03 MED ORDER — QUETIAPINE FUMARATE 25 MG PO TABS
25.0000 mg | ORAL_TABLET | Freq: Two times a day (BID) | ORAL | Status: DC
  Administered 2015-11-03 – 2015-11-04 (×4): 25 mg
  Filled 2015-11-03 (×4): qty 1

## 2015-11-03 MED ORDER — DIAZEPAM 1 MG/ML PO SOLN
2.0000 mg | Freq: Three times a day (TID) | ORAL | Status: DC
  Administered 2015-11-03 – 2015-11-11 (×25): 2 mg
  Filled 2015-11-03 (×26): qty 5

## 2015-11-03 MED ORDER — SENNOSIDES 8.8 MG/5ML PO SYRP
5.0000 mL | ORAL_SOLUTION | Freq: Two times a day (BID) | ORAL | Status: DC
  Administered 2015-11-03 – 2015-11-11 (×13): 5 mL
  Filled 2015-11-03 (×19): qty 5

## 2015-11-03 MED ORDER — SODIUM CHLORIDE 0.9 % IV SOLN
Freq: Once | INTRAVENOUS | Status: AC
  Administered 2015-11-03: 01:00:00 via INTRAVENOUS

## 2015-11-03 MED ORDER — POTASSIUM CHLORIDE 20 MEQ/15ML (10%) PO SOLN
20.0000 meq | ORAL | Status: AC
  Administered 2015-11-03 (×2): 20 meq
  Filled 2015-11-03 (×2): qty 15

## 2015-11-03 MED ORDER — SODIUM CHLORIDE 0.9 % IV SOLN
4.0000 mg/h | INTRAVENOUS | Status: DC
  Administered 2015-11-03: 2 mg/h via INTRAVENOUS
  Administered 2015-11-03 – 2015-11-04 (×2): 1 mg/h via INTRAVENOUS
  Administered 2015-11-04: 5 mg/h via INTRAVENOUS
  Administered 2015-11-05 (×3): 6 mg/h via INTRAVENOUS
  Administered 2015-11-06 (×3): 8 mg/h via INTRAVENOUS
  Administered 2015-11-06: 1 mg/h via INTRAVENOUS
  Administered 2015-11-06: 8 mg/h via INTRAVENOUS
  Filled 2015-11-03 (×12): qty 10

## 2015-11-03 MED ORDER — VANCOMYCIN HCL 10 G IV SOLR
2000.0000 mg | Freq: Once | INTRAVENOUS | Status: AC
  Administered 2015-11-03: 2000 mg via INTRAVENOUS
  Filled 2015-11-03: qty 2000

## 2015-11-03 MED ORDER — MIDAZOLAM BOLUS VIA INFUSION
1.0000 mg | INTRAVENOUS | Status: DC | PRN
  Administered 2015-11-04 (×3): 4 mg via INTRAVENOUS
  Filled 2015-11-03 (×4): qty 4

## 2015-11-03 NOTE — Progress Notes (Signed)
[facility] LAB RE[facility]ACEMENT ONLY  The [facility] does [facility] Protocol based on the criteria listed below:   1. Is GFR >/= 40 ml/min? Yes.    Patient's GFR today is >60 2. Is urine output >/= 0.5 ml/kg/hr for the last 6 hours? Yes.   Patient's UOP is 4 ml/kg/hr 3. Is BUN < 60 mg/dL? Yes.    Patient's BUN today is 8 4. Abnormal electrolyte(s):K 3.5 5. Ordered repletion with: per protocol 6. If a panic level lab has been reported, has the CCM MD in charge been notified? No..   Physician:    Markus DaftWHELAN, Daniele Dillow A 11/03/2015 5:42 AM

## 2015-11-03 NOTE — Progress Notes (Signed)
Physical Therapy Wound Treatment Patient Details  Name: Kevin Austin MRN: [id number] Date of Birth: 1990/07/15  Today's Date: 11/03/2015 Time: 0830-0929 Time Calculation (min): 59 min  Subjective  Subjective: Pt sedated during hydro session Patient and Family Stated Goals: None stated Prior Treatments: I&D 6/27  Pain Score:  RN bolused sedating meds prior and during session.  Wound Assessment  Wound / Incision (Open or Dehisced) 10/25/15 Incision - Open Hand Right Dorsal aspect - Ulnar side (Active)  Dressing Type Compression wrap;Gauze (Comment);ABD;Moist to dry 11/03/2015 11:00 AM  Dressing Changed Changed 11/03/2015 11:00 AM  Dressing Status Clean;Dry;Intact 11/03/2015 11:00 AM  Dressing Change Frequency Daily 11/03/2015 11:00 AM  Site / Wound Assessment Red;Painful;Yellow 11/03/2015 11:00 AM  % Wound base Red or Granulating 90% 11/03/2015 11:00 AM  % Wound base Yellow 10% 11/03/2015 11:00 AM  % Wound base Black 0% 11/03/2015 11:00 AM  % Wound base Other (Comment) 0% 11/03/2015 11:00 AM  Peri-wound Assessment Intact;Erythema (blanchable) 11/03/2015 11:00 AM  Wound Length (cm) 5.5 cm 11/03/2015 11:00 AM  Wound Width (cm) 2.7 cm 11/03/2015 11:00 AM  Wound Depth (cm) 0.5 cm 11/03/2015 11:00 AM  Undermining (cm) Undermines at 3:00 to a depth of 1.2 11/03/2015 11:00 AM  Margins Unattached edges (unapproximated) 11/03/2015 11:00 AM  Closure None 11/03/2015 11:00 AM  Drainage Amount Minimal 11/03/2015 11:00 AM  Drainage Description Serosanguineous;Sanguineous 11/03/2015 11:00 AM  Non-staged Wound Description Not applicable 0/12/3233 57:32 AM  Treatment Hydrotherapy (Pulse lavage);Packing (Impregnated strip) 11/03/2015 11:00 AM     Wound / Incision (Open or Dehisced) 10/25/15 Incision - Open Hand Right Dorsal aspect - Radial side (Active)  Dressing Type Compression wrap;Gauze (Comment);Moist to dry 11/03/2015 11:00 AM  Dressing Changed Changed 11/03/2015 11:00 AM  Dressing Status Clean;Dry;Intact 11/03/2015 11:00 AM  Dressing  Change Frequency Daily 11/03/2015 11:00 AM  Site / Wound Assessment Red 11/03/2015 11:00 AM  % Wound base Red or Granulating 100% 11/03/2015 11:00 AM  % Wound base Yellow 0% 11/03/2015 11:00 AM  % Wound base Black 0% 11/03/2015 11:00 AM  % Wound base Other (Comment) 0% 11/03/2015 11:00 AM  Peri-wound Assessment Intact;Edema;Erythema (blanchable) 11/03/2015 11:00 AM  Wound Length (cm) 4.3 cm 11/03/2015 11:00 AM  Wound Width (cm) 1.5 cm 11/03/2015 11:00 AM  Wound Depth (cm) 0.5 cm 11/03/2015 11:00 AM  Undermining (cm) Undermining at 9:00 to a depth of 1.0.   11/03/2015 11:00 AM  Margins Unattached edges (unapproximated) 11/03/2015 11:00 AM  Closure None 11/03/2015 11:00 AM  Drainage Amount Minimal 11/03/2015 11:00 AM  Drainage Description Serosanguineous;Sanguineous 11/03/2015 11:00 AM  Non-staged Wound Description Not applicable 2/0/2542 70:62 AM  Treatment Hydrotherapy (Pulse lavage);Packing (Impregnated strip) 11/03/2015 11:00 AM     Wound / Incision (Open or Dehisced) 10/25/15 Incision - Open Hand Left Dorsal aspect (Active)  Dressing Type Compression wrap;Gauze (Comment);Moist to dry 11/03/2015 11:00 AM  Dressing Changed Changed 11/03/2015 11:00 AM  Dressing Status Clean;Dry;Intact 11/03/2015 11:00 AM  Dressing Change Frequency Daily 11/03/2015 11:00 AM  Site / Wound Assessment Pink;Yellow 11/03/2015 11:00 AM  % Wound base Red or Granulating 80% 11/03/2015 11:00 AM  % Wound base Yellow 20% 11/03/2015 11:00 AM  % Wound base Black 0% 11/03/2015 11:00 AM  % Wound base Other (Comment) 0% 11/03/2015 11:00 AM  Peri-wound Assessment Intact;Edema;Erythema (blanchable) 11/03/2015 11:00 AM  Wound Length (cm) 3.3 cm 11/03/2015 11:00 AM  Wound Width (cm) 1.5 cm 11/03/2015 11:00 AM  Wound Depth (cm) 1.5 cm 11/03/2015 11:00 AM  Undermining (cm) Undermining at  4:00 1.0cm and at 9:00 2.0cm. 11/03/2015 11:00 AM  Margins Unattached edges (unapproximated) 11/03/2015 11:00 AM  Closure None 11/03/2015 11:00 AM  Drainage Amount Moderate 11/03/2015 11:00 AM   Drainage Description Serosanguineous;Purulent 11/03/2015 11:00 AM  Non-staged Wound Description Not applicable 09/30/4648 35:46 AM  Treatment Hydrotherapy (Pulse lavage);Packing (Impregnated strip) 11/03/2015 11:00 AM     Wound / Incision (Open or Dehisced) 10/25/15 Incision - Open Hand Left Palmar aspect (Active)  Dressing Type Compression wrap;Gauze (Comment);Moist to dry 11/03/2015 11:00 AM  Dressing Changed Changed 11/03/2015 11:00 AM  Dressing Status Clean;Dry;Intact 11/03/2015 11:00 AM  Dressing Change Frequency Daily 11/03/2015 11:00 AM  Site / Wound Assessment Red 11/03/2015 11:00 AM  % Wound base Red or Granulating 100% 11/03/2015 11:00 AM  % Wound base Yellow 0% 11/03/2015 11:00 AM  % Wound base Black 0% 11/03/2015 11:00 AM  % Wound base Other (Comment) 0% 11/03/2015 11:00 AM  Peri-wound Assessment Intact;Maceration 11/03/2015 11:00 AM  Wound Length (cm) 0.4 cm 11/03/2015 11:00 AM  Wound Width (cm) 3.5 cm 11/03/2015 11:00 AM  Wound Depth (cm) 1.8 cm 11/03/2015 11:00 AM  Undermining (cm) Undermining 4:00 - 7:00 2.0cm. 11/03/2015 11:00 AM  Margins Unattached edges (unapproximated) 11/03/2015 11:00 AM  Closure None 11/03/2015 11:00 AM  Drainage Amount Minimal 11/03/2015 11:00 AM  Drainage Description Serosanguineous 11/03/2015 11:00 AM  Non-staged Wound Description Not applicable 08/31/8125 51:70 AM  Treatment Hydrotherapy (Pulse lavage);Packing (Impregnated strip) 11/03/2015 11:00 AM     Incision (Closed) 10/23/15 Arm Right (Active)  Dressing Type Compression wrap;Gauze (Comment) 11/03/2015  9:00 AM  Dressing Clean;Dry;Intact 11/03/2015  9:00 AM  Dressing Change Frequency PRN 10/31/2015  4:00 AM  Site / Wound Assessment Dressing in place / Unable to assess 11/03/2015  9:00 AM  Drainage Amount None 11/03/2015  9:00 AM     Incision (Closed) 10/23/15 Foot Left (Active)  Dressing Type Compression wrap;Gauze (Comment) 11/03/2015  9:00 AM  Dressing Clean;Dry;Intact 11/03/2015  9:00 AM  Dressing Change Frequency PRN 10/31/2015  4:00 AM   Site / Wound Assessment Dressing in place / Unable to assess 11/03/2015  9:00 AM  Closure Sutures 11/03/2015  9:00 AM  Drainage Amount Scant 11/03/2015  9:00 AM  Drainage Description Serosanguineous 11/03/2015  9:00 AM  Treatment Cleansed 10/25/2015  8:00 AM     Incision (Closed) 11/01/15 Ankle Right (Active)  Dressing Type Compression wrap;Gauze (Comment) 11/03/2015  9:00 AM  Dressing Clean;Dry;Intact 11/03/2015  9:00 AM  Dressing Change Frequency Daily 11/03/2015  9:00 AM  Site / Wound Assessment Dressing in place / Unable to assess 11/03/2015  9:00 AM  Closure Sutures 11/03/2015  9:00 AM  Drainage Amount Scant 11/03/2015  9:00 AM  Drainage Description Serosanguineous 11/03/2015  9:00 AM     Incision (Closed) 11/01/15 Breast Other (Comment) (Active)  Dressing Type ABD;Gauze (Comment);Moist to dry 11/03/2015  9:00 AM  Dressing Dry;Intact;Old drainage (marked) 11/03/2015  9:00 AM  Site / Wound Assessment Dressing in place / Unable to assess 11/03/2015  9:00 AM  Closure None 11/03/2015  9:00 AM  Drainage Amount Moderate 11/03/2015  9:00 AM  Drainage Description Serosanguineous 11/03/2015  9:00 AM   Hydrotherapy Pulsed lavage therapy - wound location: Bilateral hands Pulsed Lavage with Suction (psi): 12 psi Pulsed Lavage with Suction - Normal Saline Used: 1000 mL (Between all wounds) Pulsed Lavage Tip: Tunneling tip   Wound Assessment and Plan  Wound Therapy - Assess/Plan/Recommendations Wound Therapy - Clinical Statement: pt with increased sanguanous drainage today and noted yellow purulance from  dorsum of L hand.  RN aware. Wound Therapy - Functional Problem List: Decreased strength/AROM of hands/fingers for ADL's and fine motor tasks.  Factors Delaying/Impairing Wound Healing: Substance abuse;Multiple medical problems Hydrotherapy Plan: Debridement;Dressing change;Patient/family education;Pulsatile lavage with suction Wound Therapy - Frequency: 6X / week Wound Therapy - Follow Up Recommendations: Other (comment)  (LTAC vs SNF) Wound Plan: See above  Wound Therapy Goals- Improve the function of patient's integumentary system by progressing the wound(s) through the phases of wound healing (inflammation - proliferation - remodeling) by: Decrease Necrotic Tissue to: 0 Decrease Necrotic Tissue - Progress: Goal set today Increase Granulation Tissue to: 100 Increase Granulation Tissue - Progress: Goal set today Patient/Family will be able to : complete dressing changes independently prior to d/c Patient/Family Instruction Goal - Progress: Not progressing Goals/treatment plan/discharge plan were made with and agreed upon by patient/family: No, Patient unable to participate in goals/treatment/discharge plan and family unavailable Time For Goal Achievement: 7 days Wound Therapy - Potential for Goals: Good  Goals will be updated until maximal potential achieved or discharge criteria met.  Discharge criteria: when goals achieved, discharge from hospital, MD decision/surgical intervention, no progress towards goals, refusal/missing three consecutive treatments without notification or medical reason.  GP     Catarina Hartshorn, [place] 11/03/2015, 12:04 PM

## 2015-11-03 NOTE — Progress Notes (Signed)
Patient placed on 35% trach collar.  Patient is currently tolerating well.  Will continue to monitor.

## 2015-11-03 NOTE — Progress Notes (Signed)
Subjective: 2 Days Post-Op Procedure(s) (LRB): IRRIGATION AND DEBRIDEMENT OF ANKLE AND SOFT TISSUE (Right) IRRIGATION AND DEBRIDEMENT OF STERNUM W/ POSSIBLE WOUND VAC PLACEMENT (N/A) Tracheostomy.  Sleeping.  Objective: Vital signs in last 24 hours: Temp:  [99.4 F (37.4 C)-100.4 F (38 C)] 99.4 F (37.4 C) (07/08 1946) Pulse Rate:  [92-134] 112 (07/08 2030) Resp:  [12-24] 22 (07/08 2000) BP: (131-165)/(80-131) 147/104 mmHg (07/08 2130) SpO2:  [96 %-100 %] 100 % (07/08 2030) FiO2 (%):  [35 %-40 %] 40 % (07/08 2026) Weight:  [92.8 kg (204 lb 9.4 oz)] 92.8 kg (204 lb 9.4 oz) (07/08 0432)  Intake/Output from previous day: 07/07 0701 - 07/08 0700 In: 4585.1 [I.V.:1860.3; Blood:335; NG/GT:1163.8; IV Piggyback:1206] Out: 5230 [Urine:5230] Intake/Output this shift: Total I/O In: 742 [I.V.:72; NG/GT:170; IV Piggyback:500] Out: 0    Recent Labs  11/01/15 0355 11/01/15 1447 11/02/15 0416 11/02/15 1821 11/03/15 0442  HGB 6.4* 7.4* 7.0* 6.7* 8.2*    Recent Labs  11/02/15 0416 11/02/15 1821 11/03/15 0442  WBC 12.9*  --  13.0*  RBC 2.53*  --  3.02*  HCT 21.3* 21.2* 25.8*  PLT 341  --  362    Recent Labs  11/02/15 0416 11/03/15 0442  NA 134* 135  K 3.7 3.5  CL 96* 96*  CO2 30 33*  BUN 9 8  CREATININE 0.67 0.49*  GLUCOSE 90 118*  CALCIUM 7.6* 8.1*   No results for input(s): LABPT, INR in the last 72 hours.  Dressings c/d/i.  No proximal erythema.  Dependent edema of digits.  Irritational rubor at distal wound edges visible under dressing.  Assessment/Plan: 2 Days Post-Op Procedure(s) (LRB): IRRIGATION AND DEBRIDEMENT OF ANKLE AND SOFT TISSUE (Right) IRRIGATION AND DEBRIDEMENT OF STERNUM W/ POSSIBLE WOUND VAC PLACEMENT (N/A) Continue hydrotherapy.  Per therapy notes, no purulence encountered during wound care.  Kevin Austin R 11/03/2015, 10:10 PM

## 2015-11-03 NOTE — Progress Notes (Signed)
Pharmacy Antibiotic Note  Kevin Austin is a 25 y.o. male continues on vancomycin for MRSA pneumonia, bacteremia and wound infection.  Vancomycin trough is below goal at 10 on 1500mg  IV q12. Renal function is stable.  Vancomycin trough goal 15-20  Plan: 1) Give Vancomycin 2g IV x 1 now then increase maintenance to 1750mg  IV q12 2) Check another trough at new steady state  Vancomycin 6/26 >  Zosyn 6/26 >>6/27 Cefepime 6/26 >6/26 Ceftaz 6/27>>6/28  6/26 BCx2: staph aureus 6/26 BCID: MRSA 6/26 UCx: sent 6/26 Sputum: MRSA 6/26 MRSA PCR: pos Multiple Wounds/Abscess = MRSA 6/28 TA: few MRSA 6/28 BCx2: ngtd 6/28 BCID: MSSA 6/30 BCx2: ngtd 7/1 ankle fluid: mod MRSA  6/28 VT = 9 on 1g q8h 6/30 VT = 14 on 1250 q8 7/1 VT = 20 on 1500 q8 (drawn 3 hrs after dose) 7/2 VT = 17 on 1500 q8 7/5 VT = 34 on 1500 mg iv Q 8 hours 7/6 VR = 10, resume at reduced dose of 1500 mg q12h 7/8 VT = 10 on 1500mg  q12 - increase to 1750mg  q12    Height: 5\' 11"  (180.3 cm) Weight: 204 lb 9.4 oz (92.8 kg) IBW/kg (Calculated) : 75.3  Temp (24hrs), Avg:99.9 F (37.7 C), Min:98.6 F (37 C), Max:100.4 F (38 C)   Recent Labs Lab 10/30/15 0410 10/31/15 0353 10/31/15 1408 11/01/15 0355 11/01/15 1447 11/02/15 0416 11/03/15 0442 11/03/15 1735  WBC  --  11.9*  --  11.2* 11.9* 12.9* 13.0*  --   CREATININE 0.53* 0.43*  --  0.50*  --  0.67 0.49*  --   VANCOTROUGH  --   --  34*  --   --   --   --  10*  VANCORANDOM  --   --   --  10  --   --   --   --     Estimated Creatinine Clearance: 167.2 mL/min (by C-G formula based on Cr of 0.49).     Kevin Austin, Kevin Austin 11/03/2015 6:18 PM

## 2015-11-03 NOTE — Progress Notes (Addendum)
      [address]       [place] [id number]             [phone]      2 Days Post-Op Procedure(s) (LRB): IRRIGATION AND DEBRIDEMENT OF ANKLE AND SOFT TISSUE (Right) IRRIGATION AND DEBRIDEMENT OF STERNUM W/ POSSIBLE WOUND VAC PLACEMENT (N/A)   Subjective:  Remains on vent, mildly sedated as PT at bedside performing dressing changes on hands.  Objective: Vital signs in last 24 hours: Temp:  [98.6 F (37 C)-101.8 F (38.8 C)] 99.8 F (37.7 C) (07/08 0824) Pulse Rate:  [92-129] 102 (07/08 0800) Cardiac Rhythm:  [-] Normal sinus rhythm (07/08 0400) Resp:  [12-24] 12 (07/08 0418) BP: (126-165)/(65-104) 144/98 mmHg (07/08 0800) SpO2:  [96 %-100 %] 100 % (07/08 0800) FiO2 (%):  [40 %] 40 % (07/08 0800) Weight:  [204 lb 9.4 oz (92.8 kg)] 204 lb 9.4 oz (92.8 kg) (07/08 0432)  Intake/Output from previous day: 07/07 0701 - 07/08 0700 In: 4585.1 [I.V.:1860.3; Blood:335; NG/GT:1163.8; IV Piggyback:1206] Out: 5230 [Urine:5230] Intake/Output this shift: Total I/O In: -  Out: 250 [Urine:250]  General appearance: mild agitation, improved after bolus via nurse, on vent Heart: regular rate and rhythm Lungs: clear to auscultation bilaterally Abdomen: soft, non-tender; bowel sounds normal; no masses,  no organomegaly Wound: dressing saturated when removed, wound bed with bleeding at base, red tissue, no frank purulence noted  Lab Results:  Recent Labs  11/02/15 0416 11/02/15 1821 11/03/15 0442  WBC 12.9*  --  13.0*  HGB 7.0* 6.7* 8.2*  HCT 21.3* 21.2* 25.8*  PLT 341  --  362   BMET:  Recent Labs  11/02/15 0416 11/03/15 0442  NA 134* 135  K 3.7 3.5  CL 96* 96*  CO2 30 33*  GLUCOSE 90 118*  BUN 9 8  CREATININE 0.67 0.49*  CALCIUM 7.6* 8.1*    PT/INR:  Recent Labs  10/31/15 1106  LABPROT 14.7  INR 1.13   ABG    Component Value Date/Time   PHART 7.446 10/29/2015 0823   HCO3 37.7* 10/29/2015 0823   TCO2 39 10/29/2015 0823   ACIDBASEDEF 1.0  10/22/2015 1832   O2SAT 100.0 10/29/2015 0823   CBG (last 3)   Recent Labs  11/02/15 2348 11/03/15 0342 11/03/15 0828  GLUCAP 112* 108* 109*    Assessment/Plan: S/P Procedure(s) (LRB): IRRIGATION AND DEBRIDEMENT OF ANKLE AND SOFT TISSUE (Right) IRRIGATION AND DEBRIDEMENT OF STERNUM W/ POSSIBLE WOUND VAC PLACEMENT (N/A)  1. S/P Sternal Abscess Debridement- dressing changed today, no frank purulence noted 2. Continue daily dressing changes, if dressing saturates told nurse can change more than once a day 3. OR culture showing GPC, however re-incubated for better growth 3. Care per ccm   LOS: 12 days    [name], ERIN 11/03/2015  patient examined and medical record reviewed,agree with above note. Kathlee Nationseter Van Trigt III 11/04/2015

## 2015-11-03 NOTE — Progress Notes (Signed)
Subjective: 2 Days Post-Op Procedure(s) (LRB): IRRIGATION AND DEBRIDEMENT OF ANKLE AND SOFT TISSUE (Right) IRRIGATION AND DEBRIDEMENT OF STERNUM W/ POSSIBLE WOUND VAC PLACEMENT (N/A) Patient reports pain as patient intubated.    Objective: Vital signs in last 24 hours: Temp:  [98.6 F (37 C)-101.8 F (38.8 C)] 99.8 F (37.7 C) (07/08 0824) Pulse Rate:  [92-129] 115 (07/08 1100) Resp:  [12-24] 12 (07/08 0418) BP: (126-165)/(65-104) 151/101 mmHg (07/08 1100) SpO2:  [96 %-100 %] 100 % (07/08 1100) FiO2 (%):  [40 %] 40 % (07/08 0937) Weight:  [92.8 kg (204 lb 9.4 oz)] 92.8 kg (204 lb 9.4 oz) (07/08 0432)  Intake/Output from previous day: 07/07 0701 - 07/08 0700 In: 4585.1 [I.V.:1860.3; Blood:335; NG/GT:1163.8; IV Piggyback:1206] Out: 5230 [Urine:5230] Intake/Output this shift: Total I/O In: 412 [I.V.:82; NG/GT:280; IV Piggyback:50] Out: 750 [Urine:750]   Recent Labs  11/01/15 0355 11/01/15 1447 11/02/15 0416 11/02/15 1821 11/03/15 0442  HGB 6.4* 7.4* 7.0* 6.7* 8.2*    Recent Labs  11/02/15 0416 11/02/15 1821 11/03/15 0442  WBC 12.9*  --  13.0*  RBC 2.53*  --  3.02*  HCT 21.3* 21.2* 25.8*  PLT 341  --  362    Recent Labs  11/02/15 0416 11/03/15 0442  NA 134* 135  K 3.7 3.5  CL 96* 96*  CO2 30 33*  BUN 9 8  CREATININE 0.67 0.49*  GLUCOSE 90 118*  CALCIUM 7.6* 8.1*   No results for input(s): LABPT, INR in the last 72 hours.  Intact pulses distally Compartment soft  R foot/ankle- moderate serosang drainage noted thru dressing.  Patient moving feet while dressing being changed.   L foot- nylon suture in place.  No swelling/erythema drainage noted. Again, patient moving this foot while dressing being changed.    Assessment/Plan: 2 Days Post-Op Procedure(s) (LRB): IRRIGATION AND DEBRIDEMENT OF ANKLE AND SOFT TISSUE (Right) IRRIGATION AND DEBRIDEMENT OF STERNUM W/ POSSIBLE WOUND VAC PLACEMENT (N/A)  PLAN: WBAT BLE Daily dressing changes with  gauze/kerlix/ace Left foot suture to come out in 1-2 weeks Continue plan per ccm  Otilio SaberM Lindsey Stanbery 11/03/2015, 11:47 AM
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
## 2015-11-04 ENCOUNTER — Inpatient Hospital Stay (HOSPITAL_COMMUNITY): Payer: Medicaid Other

## 2015-11-04 DIAGNOSIS — R319 Hematuria, unspecified: Secondary | ICD-10-CM

## 2015-11-04 LAB — APTT: APTT: 34 s (ref 24–37)

## 2015-11-04 LAB — GLUCOSE, CAPILLARY
GLUCOSE-CAPILLARY: 100 mg/dL — AB (ref 65–99)
GLUCOSE-CAPILLARY: 98 mg/dL (ref 65–99)
Glucose-Capillary: 103 mg/dL — ABNORMAL HIGH (ref 65–99)
Glucose-Capillary: 131 mg/dL — ABNORMAL HIGH (ref 65–99)
Glucose-Capillary: 118 mg/dL — ABNORMAL HIGH (ref 65–99)
Glucose-Capillary: 113 mg/dL — ABNORMAL HIGH (ref 65–99)

## 2015-11-04 LAB — COMPREHENSIVE METABOLIC PANEL
ALT: 19 U/L (ref 17–63)
ANION GAP: 7 (ref 5–15)
AST: 32 U/L (ref 15–41)
Albumin: 1.4 g/dL — ABNORMAL LOW (ref 3.5–5.0)
Alkaline Phosphatase: 146 U/L — ABNORMAL HIGH (ref 38–126)
BUN: 8 mg/dL (ref 6–20)
CHLORIDE: 97 mmol/L — AB (ref 101–111)
CO2: 29 mmol/L (ref 22–32)
Calcium: 8.3 mg/dL — ABNORMAL LOW (ref 8.9–10.3)
Creatinine, Ser: 0.5 mg/dL — ABNORMAL LOW (ref 0.61–1.24)
GFR calc non Af Amer: >60 mL/min (ref >60)
Glucose, Bld: 112 mg/dL — ABNORMAL HIGH (ref 65–99)
Potassium: 3.8 mmol/L (ref 3.5–5.1)
SODIUM: 133 mmol/L — AB (ref 135–145)
Total Bilirubin: 1.5 mg/dL — ABNORMAL HIGH (ref 0.3–1.2)
Total Protein: 7.3 g/dL (ref 6.5–8.1)

## 2015-11-04 LAB — CBC WITH DIFFERENTIAL/PLATELET
BASOS PCT: 0 %
Basophils Absolute: 0 10*3/uL (ref 0.0–0.1)
EOS ABS: 0.1 10*3/uL (ref 0.0–0.7)
EOS PCT: 1 %
HCT: 26.8 % — ABNORMAL LOW (ref 39.0–52.0)
Hemoglobin: 8.5 g/dL — ABNORMAL LOW (ref 13.0–17.0)
LYMPHS ABS: 1.7 10*3/uL (ref 0.7–4.0)
Lymphocytes Relative: 12 %
MCH: 27.3 pg (ref 26.0–34.0)
MCHC: 31.7 g/dL (ref 30.0–36.0)
MCV: 86.2 fL (ref 78.0–100.0)
Monocytes Absolute: 1.2 10*3/uL — ABNORMAL HIGH (ref 0.1–1.0)
Monocytes Relative: 9 %
Neutro Abs: 10.7 10*3/uL — ABNORMAL HIGH (ref 1.7–7.7)
Neutrophils Relative %: 78 %
PLATELETS: 365 10*3/uL (ref 150–400)
RBC: 3.11 MIL/uL — AB (ref 4.22–5.81)
RDW: 14.7 % (ref 11.5–15.5)
WBC: 13.7 10*3/uL — AB (ref 4.0–10.5)

## 2015-11-04 LAB — PROTIME-INR
INR: 1.16 (ref 0.00–1.49)
Prothrombin Time: 15 seconds (ref 11.6–15.2)

## 2015-11-04 LAB — MAGNESIUM: MAGNESIUM: 1.6 mg/dL — AB (ref 1.7–2.4)

## 2015-11-04 LAB — TRIGLYCERIDES: Triglycerides: 245 mg/dL — ABNORMAL HIGH (ref <150)

## 2015-11-04 MED ORDER — MIDAZOLAM BOLUS VIA INFUSION
1.0000 mg | INTRAVENOUS | Status: DC | PRN
  Administered 2015-11-04: 2 mg via INTRAVENOUS
  Administered 2015-11-04: 4 mg via INTRAVENOUS
  Administered 2015-11-05: 1 mg via INTRAVENOUS
  Administered 2015-11-05: 2 mg via INTRAVENOUS
  Administered 2015-11-05: 4 mg via INTRAVENOUS
  Administered 2015-11-07 – 2015-11-08 (×2): 2 mg via INTRAVENOUS
  Filled 2015-11-04 (×8): qty 4

## 2015-11-04 MED ORDER — ROCURONIUM BROMIDE 50 MG/5ML IV SOLN
75.0000 mg | Freq: Once | INTRAVENOUS | Status: DC
  Filled 2015-11-04: qty 7.5

## 2015-11-04 MED ORDER — GADOBENATE DIMEGLUMINE 529 MG/ML IV SOLN
20.0000 mL | Freq: Once | INTRAVENOUS | Status: AC | PRN
  Administered 2015-11-04: 20 mL via INTRAVENOUS

## 2015-11-04 MED ORDER — DEXTROSE 5 % IV SOLN
3.0000 g | Freq: Once | INTRAVENOUS | Status: AC
  Administered 2015-11-04: 3 g via INTRAVENOUS
  Filled 2015-11-04: qty 6

## 2015-11-04 NOTE — Progress Notes (Signed)
Subjective: 3 Days Post-Op Procedure(s) (LRB): IRRIGATION AND DEBRIDEMENT OF ANKLE AND SOFT TISSUE (Right) IRRIGATION AND DEBRIDEMENT OF STERNUM W/ POSSIBLE WOUND VAC PLACEMENT (N/A) Patient reports pain as patient still intubated, but not in room.  Likely in MRI.    Objective: Vital signs in last 24 hours: Temp:  [98.2 F (36.8 C)-100.2 F (37.9 C)] 98.2 F (36.8 C) (07/09 0744) Pulse Rate:  [106-155] 155 (07/09 0845) Resp:  [20-30] 20 (07/09 0845) BP: (143-169)/(82-131) 163/102 mmHg (07/09 0845) SpO2:  [94 %-100 %] 100 % (07/09 0845) FiO2 (%):  [35 %-40 %] 40 % (07/09 0845) Weight:  [89 kg (196 lb 3.4 oz)] 89 kg (196 lb 3.4 oz) (07/09 0500)  Intake/Output from previous day: 07/08 0701 - 07/09 0700 In: 3754.9 [I.V.:884.9; NG/GT:1770; IV Piggyback:1100] Out: 7450 [Urine:7450] Intake/Output this shift:     Recent Labs  11/01/15 1447 11/02/15 0416 11/02/15 1821 11/03/15 0442 11/04/15 0522  HGB 7.4* 7.0* 6.7* 8.2* 8.5*    Recent Labs  11/03/15 0442 11/04/15 0522  WBC 13.0* 13.7*  RBC 3.02* 3.11*  HCT 25.8* 26.8*  PLT 362 365    Recent Labs  11/03/15 0442 11/04/15 0522  NA 135 133*  K 3.5 3.8  CL 96* 97*  CO2 33* 29  BUN 8 8  CREATININE 0.49* 0.50*  GLUCOSE 118* 112*  CALCIUM 8.1* 8.3*    Recent Labs  11/04/15 0844  INR 1.16    unable to obtain as patient not in room  Assessment/Plan: 3 Days Post-Op Procedure(s) (LRB): IRRIGATION AND DEBRIDEMENT OF ANKLE AND SOFT TISSUE (Right) IRRIGATION AND DEBRIDEMENT OF STERNUM W/ POSSIBLE WOUND VAC PLACEMENT (N/A) Up with therapy  WBAT BLE Dressings changed by me yesterday, but unable to do this today as patient not in room Please change dressings with gauze, kerlix and ace wraps (new supply in room) Nylon sutures to remain in left foot for another 10 days Continue plan per ccs  Kevin SaberM Lindsey Jahnya Austin 11/04/2015, 11:04 AM

## 2015-11-04 NOTE — Progress Notes (Signed)
      [address]       [place] [id number]             [phone]      3 Days Post-Op Procedure(s) (LRB): IRRIGATION AND DEBRIDEMENT OF ANKLE AND SOFT TISSUE (Right) IRRIGATION AND DEBRIDEMENT OF STERNUM W/ POSSIBLE WOUND VAC PLACEMENT (N/A)   Subjective:  Febrile at times, continues to have hematuria, remains on vent   Objective: Vital signs in last 24 hours: Temp:  [99.8 F (37.7 C)-101.5 F (38.6 C)] 100.5 F (38.1 C) (07/10 0426) Pulse Rate:  [110-155] 119 (07/10 0700) Cardiac Rhythm:  [-] Sinus tachycardia (07/10 0400) Resp:  [18-21] 20 (07/10 0700) BP: (114-163)/(63-102) 140/71 mmHg (07/10 0700) SpO2:  [95 %-100 %] 99 % (07/10 0700) FiO2 (%):  [40 %] 40 % (07/10 0355) Weight:  [185 lb 10 oz (84.2 kg)] 185 lb 10 oz (84.2 kg) (07/10 0500)  Intake/Output from previous day: 07/09 0701 - 07/10 0700 In: 3827.1 [I.V.:1287.1; NG/GT:1640; IV Piggyback:900] Out: 6000 [Urine:6000]  General appearance: on vent Heart: regular rate and rhythm Lungs: clear to auscultation bilaterally Abdomen: soft, non-tender; bowel sounds normal; no masses,  no organomegaly Wound: packing in place, no pururlence noted  Lab Results:  Recent Labs  11/04/15 0522 11/05/15 0625  WBC 13.7* 11.6*  HGB 8.5* 7.4*  HCT 26.8* 23.6*  PLT 365 363   BMET:   Recent Labs  11/04/15 0522 11/05/15 0625  NA 133* 134*  K 3.8 3.8  CL 97* 97*  CO2 29 30  GLUCOSE 112* 94  BUN 8 12  CREATININE 0.50* 0.56*  CALCIUM 8.3* 8.2*    PT/INR:   Recent Labs  11/04/15 0844  LABPROT 15.0  INR 1.16   ABG    Component Value Date/Time   PHART 7.446 10/29/2015 0823   HCO3 37.7* 10/29/2015 0823   TCO2 39 10/29/2015 0823   ACIDBASEDEF 1.0 10/22/2015 1832   O2SAT 100.0 10/29/2015 0823   CBG (last 3)   Recent Labs  11/04/15 1936 11/04/15 2350 11/05/15 0421  GLUCAP 113* 108* 107*    Assessment/Plan: S/P Procedure(s) (LRB): IRRIGATION AND DEBRIDEMENT OF ANKLE AND SOFT  TISSUE (Right) IRRIGATION AND DEBRIDEMENT OF STERNUM W/ POSSIBLE WOUND VAC PLACEMENT (N/A)  1. Sternal abscess- remains unchanged, continue daily dressing changes 2. ID- OR cultures positive for MRSA, continue Vanc, recs per ID 3. Dispo- care per CCM   LOS: 14 days    [name], ERIN 11/05/2015
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
## 2015-11-04 NOTE — Progress Notes (Signed)
Dr Celene SkeenNester notified of patient's bloody urine.

## 2015-11-05 DIAGNOSIS — Z9981 Dependence on supplemental oxygen: Secondary | ICD-10-CM

## 2015-11-05 DIAGNOSIS — Z930 Tracheostomy status: Secondary | ICD-10-CM

## 2015-11-05 DIAGNOSIS — M461 Sacroiliitis, not elsewhere classified: Secondary | ICD-10-CM

## 2015-11-05 DIAGNOSIS — M868X0 Other osteomyelitis, multiple sites: Secondary | ICD-10-CM

## 2015-11-05 DIAGNOSIS — L02212 Cutaneous abscess of back [any part, except buttock]: Secondary | ICD-10-CM

## 2015-11-05 DIAGNOSIS — R509 Fever, unspecified: Secondary | ICD-10-CM

## 2015-11-05 LAB — COMPREHENSIVE METABOLIC PANEL
ALBUMIN: 1.5 g/dL — AB (ref 3.5–5.0)
ALK PHOS: 126 U/L (ref 38–126)
ALT: 25 U/L (ref 17–63)
AST: 41 U/L (ref 15–41)
Anion gap: 7 (ref 5–15)
BILIRUBIN TOTAL: 1.4 mg/dL — AB (ref 0.3–1.2)
BUN: 12 mg/dL (ref 6–20)
CALCIUM: 8.2 mg/dL — AB (ref 8.9–10.3)
CO2: 30 mmol/L (ref 22–32)
CREATININE: 0.56 mg/dL — AB (ref 0.61–1.24)
Chloride: 97 mmol/L — ABNORMAL LOW (ref 101–111)
GFR calc Af Amer: >60 mL/min (ref >60)
GLUCOSE: 94 mg/dL (ref 65–99)
POTASSIUM: 3.8 mmol/L (ref 3.5–5.1)
Sodium: 134 mmol/L — ABNORMAL LOW (ref 135–145)
TOTAL PROTEIN: 7.3 g/dL (ref 6.5–8.1)

## 2015-11-05 LAB — CBC
HCT: 22.9 % — ABNORMAL LOW (ref 39.0–52.0)
Hemoglobin: 7.2 g/dL — ABNORMAL LOW (ref 13.0–17.0)
MCH: 26.9 pg (ref 26.0–34.0)
MCHC: 31.4 g/dL (ref 30.0–36.0)
MCV: 85.4 fL (ref 78.0–100.0)
PLATELETS: 422 10*3/uL — AB (ref 150–400)
RBC: 2.68 MIL/uL — AB (ref 4.22–5.81)
RDW: 14.8 % (ref 11.5–15.5)
WBC: 12.6 10*3/uL — AB (ref 4.0–10.5)

## 2015-11-05 LAB — CBC WITH DIFFERENTIAL/PLATELET
BASOS ABS: 0 10*3/uL (ref 0.0–0.1)
BASOS PCT: 0 %
Eosinophils Absolute: 0.1 10*3/uL (ref 0.0–0.7)
Eosinophils Relative: 1 %
HEMATOCRIT: 23.6 % — AB (ref 39.0–52.0)
HEMOGLOBIN: 7.4 g/dL — AB (ref 13.0–17.0)
LYMPHS PCT: 20 %
Lymphs Abs: 2.3 10*3/uL (ref 0.7–4.0)
MCH: 26.7 pg (ref 26.0–34.0)
MCHC: 31.4 g/dL (ref 30.0–36.0)
MCV: 85.2 fL (ref 78.0–100.0)
MONO ABS: 0.9 10*3/uL (ref 0.1–1.0)
Monocytes Relative: 8 %
NEUTROS ABS: 8.2 10*3/uL — AB (ref 1.7–7.7)
NEUTROS PCT: 71 %
Platelets: 363 10*3/uL (ref 150–400)
RBC: 2.77 MIL/uL — ABNORMAL LOW (ref 4.22–5.81)
RDW: 14.8 % (ref 11.5–15.5)
WBC: 11.6 10*3/uL — ABNORMAL HIGH (ref 4.0–10.5)

## 2015-11-05 LAB — VANCOMYCIN, TROUGH: Vancomycin Tr: 11 ug/mL — ABNORMAL LOW (ref 15–20)

## 2015-11-05 LAB — GLUCOSE, CAPILLARY
GLUCOSE-CAPILLARY: 108 mg/dL — AB (ref 65–99)
GLUCOSE-CAPILLARY: 108 mg/dL — AB (ref 65–99)
GLUCOSE-CAPILLARY: 96 mg/dL (ref 65–99)
Glucose-Capillary: 107 mg/dL — ABNORMAL HIGH (ref 65–99)
Glucose-Capillary: 107 mg/dL — ABNORMAL HIGH (ref 65–99)
Glucose-Capillary: 107 mg/dL — ABNORMAL HIGH (ref 65–99)

## 2015-11-05 LAB — MAGNESIUM: MAGNESIUM: 1.8 mg/dL (ref 1.7–2.4)

## 2015-11-05 MED ORDER — VANCOMYCIN HCL 10 G IV SOLR
1250.0000 mg | Freq: Three times a day (TID) | INTRAVENOUS | Status: DC
  Administered 2015-11-06 (×2): 1250 mg via INTRAVENOUS
  Filled 2015-11-05 (×3): qty 1250

## 2015-11-05 MED ORDER — POLYETHYLENE GLYCOL 3350 17 G PO PACK
17.0000 g | PACK | Freq: Every day | ORAL | Status: DC
  Administered 2015-11-05 – 2015-12-14 (×30): 17 g via ORAL
  Filled 2015-11-05 (×38): qty 1

## 2015-11-05 MED ORDER — LACTULOSE 10 GM/15ML PO SOLN
30.0000 g | Freq: Once | ORAL | Status: AC
  Administered 2015-11-05: 30 g via ORAL
  Filled 2015-11-05: qty 45

## 2015-11-05 MED ORDER — VITAL HIGH PROTEIN PO LIQD
1000.0000 mL | ORAL | Status: DC
  Administered 2015-11-05: 1000 mL

## 2015-11-05 MED ORDER — QUETIAPINE FUMARATE 50 MG PO TABS
50.0000 mg | ORAL_TABLET | Freq: Two times a day (BID) | ORAL | Status: DC
Start: 2015-11-05 — End: 2015-11-11
  Administered 2015-11-05 – 2015-11-11 (×13): 50 mg
  Filled 2015-11-05 (×14): qty 1

## 2015-11-05 NOTE — Progress Notes (Signed)
   Assessment: S/P Procedure(s) (LRB): IRRIGATION AND DEBRIDEMENT FOOT (Left) and Right Left Foot on 10/24/15 and Right Ankle 11/01/15 by Dr. Marcial Pacasimothy D. Eulah PontMurphy   Principal Problem:   MRSA bacteremia Active Problems:   Acute respiratory failure (HCC)   Abscess   Altered mental status   Encounter for central line placement   Bilateral pneumonia   Sternal osteomyelitis (HCC)   HCAP (healthcare-associated pneumonia)   Acute pulmonary edema (HCC)  Disseminated MRSA bacteremia   MRI spine suggests lumbosacral osteo and possible septic right hip.    Right Foot/Ankle: warm, expected post op swelling, no purulence. New gauze, kerlix, ace placed.  Manipulation of ankle does not illicit pain response.  Nylon sutures in place likely for ~2wk postop.  Left Foot.  Stable.  Surgical site/wound looks good. Remaining suture removed - center of would open to drain.  Scant serous drainage.  Left ankle palpation and passive ROM does not illicit pain response.     S/p multiple I&D's of extremities and sternum.    Plan: Right hip may require I/D in the near future.  Dr. Eulah PontMurphy to discuss with primary team in light of lumbrosacral osteo - Neurosugery may be involved.  Continue Abx and plan per CCM. B/L Feet: Continue pressure gauze dressing / kerlix / ace wrap.  Weight Bearing: Weight Bearing as Tolerated (WBAT) Dressings: B/L Feet/Ankles Gauze / Kerlix / Ace wrap Daily.  VTE prophylaxis: per primary. Dispo: per primary  Subjective: Intubated, sedated.   Objective:   VITALS:   Filed Vitals:   11/05/15 0426 11/05/15 0500 11/05/15 0600 11/05/15 0700  BP:  145/76 136/83 140/71  Pulse:  119 116 119  Temp: 100.5 F (38.1 C)     TempSrc: Rectal     Resp:  20 20 20   Height:      Weight:  84.2 kg (185 lb 10 oz)    SpO2:  98% 97% 99%    Lab Results  Component Value Date   WBC 11.6* 11/05/2015   HGB 7.4* 11/05/2015   HCT 23.6* 11/05/2015   MCV 85.2 11/05/2015   PLT 363 11/05/2015     Physical Exam General: NAD.  Intubated, sedated  MSK:  Right Foot/Ankle: warm, expected post op swelling, no purulence. New gauze, kerlix, ace placed.  Manipulation of ankle does not illicit pain response.  Nylon sutures in place. Left Foot.  Stable.  Surgical site/wound looks good. Remaining suture removed - center of would open to drain.  Scant serous drainage.  Left ankle palpation and passive ROM does not illicit pain response.    Right hip non-erythematous.  Exam limited dt sedation.  Passive ROM w/o pain response.    Kevin Austin 11/05/2015, 8:10 AM

## 2015-11-05 NOTE — Progress Notes (Signed)
Dr Molli KnockYacoub notified of CBC results at 1800 no orders at this time

## 2015-11-05 NOTE — Progress Notes (Signed)
Patient placed on 40% trach collar.  Currently tolerating well.  Will continue to monitor.  

## 2015-11-05 NOTE — Progress Notes (Signed)
Pharmacy Antibiotic Note  Kevin Austin is a 25 y.o. male continues on vancomycin for MRSA pneumonia, bacteremia and wound infection.  Vancomycin trough is below goal at 10 on 1500mg  IV q12. Renal function is stable.  Vancomycin trough goal 15-20  Plan: 1) Change vancomycin to 1250 mg q 8 hrs. 2) Check another trough at new steady state  Vancomycin 6/26 >  Zosyn 6/26 >>6/27 Cefepime 6/26 >6/26 Ceftaz 6/27>>6/28  6/26 BCx2: staph aureus 6/26 BCID: MRSA 6/26 UCx: sent 6/26 Sputum: MRSA 6/26 MRSA PCR: pos Multiple Wounds/Abscess = MRSA 6/28 TA: few MRSA 6/28 BCx2: ngtd 6/28 BCID: MSSA 6/30 BCx2: ngtd 7/1 ankle fluid: mod MRSA  6/28 VT = 9 on 1g q8h 6/30 VT = 14 on 1250 q8 7/1 VT = 20 on 1500 q8 (drawn 3 hrs after dose) 7/2 VT = 17 on 1500 q8 7/5 VT = 34 on 1500 mg iv Q 8 hours 7/6 VR = 10, resume at reduced dose of 1500 mg q12h 7/8 VT = 10 on 1500mg  q12 - increase to 1750mg  q12 7/10 VT = 11 on 1750 q 12 hrs - change to 1250 mg q 8 hrs   Height: 5\' 11"  (180.3 cm) Weight: 185 lb 10 oz (84.2 kg) IBW/kg (Calculated) : 75.3  Temp (24hrs), Avg:101.4 F (38.6 C), Min:100.5 F (38.1 C), Max:103.2 F (39.6 C)   Recent Labs Lab 11/01/15 0355  11/02/15 0416 11/03/15 0442 11/03/15 1735 11/04/15 0522 11/05/15 0625 11/05/15 1747 11/05/15 1750  WBC 11.2*  < > 12.9* 13.0*  --  13.7* 11.6*  --  12.6*  CREATININE 0.50*  --  0.67 0.49*  --  0.50* 0.56*  --   --   VANCOTROUGH  --   --   --   --  10*  --   --  11*  --   VANCORANDOM 10  --   --   --   --   --   --   --   --   < > = values in this interval not displayed.  Estimated Creatinine Clearance: 153 mL/min (by C-G formula based on Cr of 0.56).    Tad MooreJessica Mariano Doshi, Pharm D, BCPS  Clinical Pharmacist Pager [phone]  11/05/2015 7:37 PM

## 2015-11-05 NOTE — Progress Notes (Signed)
Kevin Austin for Infectious Disease    Date of Admission:  10/22/2015   Day 15 on vancomycin          Principal Problem:   MRSA bacteremia Active Problems:   Acute respiratory failure (HCC)   Abscess   Altered mental status   Encounter for central line placement   Bilateral pneumonia   Sternal osteomyelitis ([place])   HCAP (healthcare-associated pneumonia)   Acute pulmonary edema ([place])   . antiseptic oral rinse  7 mL Mouth Rinse QID  . chlorhexidine gluconate (SAGE KIT)  15 mL Mouth Rinse BID  . diazepam  2 mg Per Tube Q8H  . famotidine (PEPCID) IV  20 mg Intravenous Q12H  . furosemide  20 mg Intravenous Q12H  . methadone  20 mg Per Tube Q8H  . polyethylene glycol  17 g Oral Daily  . QUEtiapine  50 mg Per Tube Q12H  . rocuronium  75 mg Intravenous Once  . sennosides  5 mL Per Tube BID  . sodium chloride  1,000 mL Intravenous Once  . vancomycin  1,750 mg Intravenous Q12H    SUBJECTIVE: Patient is sedated. No new major events were reported today.  Review of Systems: Review of Systems  Unable to perform ROS: medical condition    History reviewed. No pertinent past medical history.  Social History  Substance Use Topics  . Smoking status: None  . Smokeless tobacco: None  . Alcohol Use: None    History reviewed. No pertinent family history. Allergies  Allergen Reactions  . Ketorolac Nausea Only    Per [facility] records  . Tramadol Nausea Only    Per [facility] records    OBJECTIVE: Filed Vitals:   11/05/15 0800 11/05/15 0825 11/05/15 0900 11/05/15 0901  BP: 135/72 135/72 135/73   Pulse: 113 113 132   Temp:    101.9 F (38.8 C)  TempSrc:    Rectal  Resp: 20 19    Height:      Weight:      SpO2: 100% 100% 92%    Body mass index is 25.9 kg/(m^2).  Physical Exam GENERAL- Sedated, attends to voice and follows commands HEENT- PERRL, normal conjuntivae CARDIAC- Tachycardic, no murmurs, rubs or gallops. RESP- Bilateral crackles, upper  airway/trach secretions noisy ABDOMEN- Soft, no guarding or rebound, normoactive bowel sounds present NEURO- Moving all extremities EXTREMITIES- pulse 2+, symmetric, trace edema. SKIN- Diaphoretic, diffuse folliculitis present   Lab Results Lab Results  Component Value Date   WBC 11.6* 11/05/2015   HGB 7.4* 11/05/2015   HCT 23.6* 11/05/2015   MCV 85.2 11/05/2015   PLT 363 11/05/2015    Lab Results  Component Value Date   CREATININE 0.56* 11/05/2015   BUN 12 11/05/2015   NA 134* 11/05/2015   K 3.8 11/05/2015   CL 97* 11/05/2015   CO2 30 11/05/2015    Lab Results  Component Value Date   ALT 25 11/05/2015   AST 41 11/05/2015   ALKPHOS 126 11/05/2015   BILITOT 1.4* 11/05/2015     Microbiology: Recent Results (from the past 240 hour(s))  Culture, blood (routine x 2)     Status: None   Collection Time: 10/26/15  3:00 PM  Result Value Ref Range Status   Specimen Description BLOOD RIGHT ANTECUBITAL  Final   Special Requests BOTTLES DRAWN AEROBIC ONLY 5CC  Final   Culture NO GROWTH 5 DAYS  Final   Report Status 10/31/2015 FINAL  Final  Culture, blood (routine x 2)     Status: None   Collection Time: 10/26/15  3:07 PM  Result Value Ref Range Status   Specimen Description BLOOD RIGHT ANTECUBITAL  Final   Special Requests BOTTLES DRAWN AEROBIC ONLY 5CC  Final   Culture NO GROWTH 5 DAYS  Final   Report Status 10/31/2015 FINAL  Final  Body fluid culture     Status: None   Collection Time: 10/27/15  1:57 PM  Result Value Ref Range Status   Specimen Description FLUID ANKLE  Final   Special Requests Immunocompromised  Final   Gram Stain   Final    ABUNDANT WBC PRESENT, PREDOMINANTLY PMN MODERATE GRAM POSITIVE COCCI IN CLUSTERS    Culture   Final    MODERATE METHICILLIN RESISTANT STAPHYLOCOCCUS AUREUS   Report Status 10/30/2015 FINAL  Final   Organism ID, Bacteria METHICILLIN RESISTANT STAPHYLOCOCCUS AUREUS  Final      Susceptibility   Methicillin resistant staphylococcus  aureus - MIC*    CIPROFLOXACIN <=0.5 SENSITIVE Sensitive     ERYTHROMYCIN >=8 RESISTANT Resistant     GENTAMICIN <=0.5 SENSITIVE Sensitive     OXACILLIN >=4 RESISTANT Resistant     TETRACYCLINE <=1 SENSITIVE Sensitive     VANCOMYCIN <=0.5 SENSITIVE Sensitive     TRIMETH/SULFA <=10 SENSITIVE Sensitive     CLINDAMYCIN <=0.25 SENSITIVE Sensitive     RIFAMPIN <=0.5 SENSITIVE Sensitive     Inducible Clindamycin NEGATIVE Sensitive     * MODERATE METHICILLIN RESISTANT STAPHYLOCOCCUS AUREUS  Aerobic/Anaerobic Culture (surgical/deep wound)     Status: None (Preliminary result)   Collection Time: 11/01/15  4:39 PM  Result Value Ref Range Status   Specimen Description ABSCESS  Final   Special Requests LEFT STERNUM PATIENT ON FOLLOWING VANC  Final   Gram Stain   Final    ABUNDANT WBC PRESENT,BOTH PMN AND MONONUCLEAR FEW GRAM POSITIVE COCCI IN CLUSTERS    Culture   Final    FEW METHICILLIN RESISTANT STAPHYLOCOCCUS AUREUS NO ANAEROBES ISOLATED; CULTURE IN PROGRESS FOR 5 DAYS    Report Status PENDING  Incomplete   Organism ID, Bacteria METHICILLIN RESISTANT STAPHYLOCOCCUS AUREUS  Final      Susceptibility   Methicillin resistant staphylococcus aureus - MIC*    CIPROFLOXACIN <=0.5 SENSITIVE Sensitive     ERYTHROMYCIN >=8 RESISTANT Resistant     GENTAMICIN <=0.5 SENSITIVE Sensitive     OXACILLIN >=4 RESISTANT Resistant     TETRACYCLINE <=1 SENSITIVE Sensitive     VANCOMYCIN <=0.5 SENSITIVE Sensitive     TRIMETH/SULFA <=10 SENSITIVE Sensitive     CLINDAMYCIN <=0.25 SENSITIVE Sensitive     RIFAMPIN <=0.5 SENSITIVE Sensitive     Inducible Clindamycin NEGATIVE Sensitive     * FEW METHICILLIN RESISTANT STAPHYLOCOCCUS AUREUS     ASSESSMENT: Disseminated MRSA bacteremia: Kevin Austin remains febrile with an increasing temperature up to 101.9 after 2 weeks of treatment on vancomycin. This infection is complicated by extensive local abscess formation and osteomyelitis throughout his body even though  all isolates have been vancomycin susceptible. He was briefly at a subtherapeutic concentration vancomycin for a period last week with a random level of 10 but this has already been adjusted. We will confirm with pharmacy he is on a therapeutic dose now. While this ongoing fever is likely still from his disseminated infection, he is also at risk now for secondary infections due to his 2 weeks in the ICU with a PICC line, a foley catheter, ventilator dependency now with trach  collar, and multiple surgeries. Repeating blood, sputum, and urine cultures today would be appropriate to rule out any new infection that vancomycin would not treat. Orthopedic and cardiothoracic surgery services are greatly appreciated for drainage of multiple large abscesses and we agree with continuing this on other significantly large organized collections.  PLAN: 1. Continue treatment with vancomycin, confirm therapeutic dosing again today 2. Recommend repeating blood, sputum, urine cultures  Collier Salina, MD PGY-II Internal Medicine Resident Pager# [phone] 11/05/2015, 11:16 AM

## 2015-11-05 NOTE — Progress Notes (Signed)
Spoke with Dr. Orvan Falconerampbell (ID) regarding new MRI findings of extensive osteomyelitis of L4-S2. At this point, he does not recommend Neurosurgery consult for debridement because he does not think that they will perform surgery on him. He believes they will recommend medical management with Vancomycin. I also spoke with him about possibly repeating a TEE, given his extensive septic emboli. TEE from 6/28 did not show vegetations. Dr. Orvan Falconerampbell did not recommend repeating TEE at this time because although TEE's are more sensitive in the 2nd week of illness, it will not change management. He will likely received ~8 weeks of IV Vancomycin regardless of what the TEE shows. If he does develop new heart failure or a new murmur, we could consider repeating TEE.  -Will continue medical management with Vancomycin for now -Appreciate ID input  Willadean CarolKaty Mayo, MD Family Medicine, PGY-2

## 2015-11-05 NOTE — Progress Notes (Signed)
Physical Therapy Wound Treatment Patient Details  Name: Kevin Austin MRN: [id number] Date of Birth: June 08, 1990  Today's Date: 11/05/2015 Time: 0830-0930 Time Calculation (min): 60 min  Subjective  Subjective: Pt sedated during hydro session however opened eyes and smiled a few times. Nodded in agreement when informed of hydrotherapy. Patient and Family Stated Goals: None stated Date of Onset:  (Unknown) Prior Treatments: I&D 6/27  Pain Score:  Pt grimacing throughout treatment but sedated.   Wound Assessment  Wound / Incision (Open or Dehisced) 10/25/15 Incision - Open Hand Right Dorsal aspect - Ulnar side (Active)  Dressing Type Compression wrap;Moist to dry;Gauze (Comment) 11/05/2015 10:08 AM  Dressing Changed Changed 11/05/2015 10:08 AM  Dressing Status Clean;Dry;Intact 11/05/2015 10:08 AM  Dressing Change Frequency Daily 11/05/2015 10:08 AM  Site / Wound Assessment Red;Bleeding 11/05/2015 10:08 AM  % Wound base Red or Granulating 100% 11/05/2015 10:08 AM  % Wound base Yellow 0% 11/05/2015 10:08 AM  % Wound base Black 0% 11/03/2015 11:00 AM  % Wound base Other (Comment) 0% 11/05/2015 10:08 AM  Peri-wound Assessment Intact 11/05/2015 10:08 AM  Wound Length (cm) 5.5 cm 11/03/2015 11:00 AM  Wound Width (cm) 2.7 cm 11/03/2015 11:00 AM  Wound Depth (cm) 0.5 cm 11/03/2015 11:00 AM  Undermining (cm) [place] at 3:00 to a depth of 1.2 11/03/2015 11:00 AM  Margins Unattached edges (unapproximated) 11/05/2015 10:08 AM  Closure None 11/05/2015 10:08 AM  Drainage Amount Minimal 11/05/2015 10:08 AM  Drainage Description Sanguineous 11/05/2015 10:08 AM  Non-staged Wound Description Not applicable 12/09/4816 56:31 AM  Treatment Hydrotherapy (Pulse lavage);Packing (Saline gauze) 11/05/2015 10:08 AM     Wound / Incision (Open or Dehisced) 10/25/15 Incision - Open Hand Right Dorsal aspect - Radial side (Active)  Dressing Type Compression wrap;Gauze (Comment);Moist to dry 11/05/2015 10:08 AM  Dressing Changed Changed  11/05/2015 10:08 AM  Dressing Status Clean;Dry;Intact 11/05/2015 10:08 AM  Dressing Change Frequency Daily 11/05/2015 10:08 AM  Site / Wound Assessment Red;Bleeding 11/05/2015 10:08 AM  % Wound base Red or Granulating 100% 11/05/2015 10:08 AM  % Wound base Yellow 0% 11/05/2015 10:08 AM  % Wound base Black 0% 11/05/2015 10:08 AM  % Wound base Other (Comment) 0% 11/05/2015 10:08 AM  Peri-wound Assessment Intact 11/05/2015 10:08 AM  Wound Length (cm) 4.3 cm 11/03/2015 11:00 AM  Wound Width (cm) 1.5 cm 11/03/2015 11:00 AM  Wound Depth (cm) 0.5 cm 11/03/2015 11:00 AM  Undermining (cm) Undermining at 9:00 to a depth of 1.0.   11/03/2015 11:00 AM  Margins Unattached edges (unapproximated) 11/05/2015 10:08 AM  Closure None 11/05/2015 10:08 AM  Drainage Amount Minimal 11/05/2015 10:08 AM  Drainage Description Sanguineous 11/05/2015 10:08 AM  Non-staged Wound Description Not applicable 4/97/0263 78:58 AM  Treatment Hydrotherapy (Pulse lavage);Packing (Saline gauze) 11/05/2015 10:08 AM     Wound / Incision (Open or Dehisced) 10/25/15 Incision - Open Hand Left Dorsal aspect (Active)  Dressing Type Compression wrap;Moist to dry;Gauze (Comment) 11/05/2015 10:08 AM  Dressing Changed Changed 11/05/2015 10:08 AM  Dressing Status Clean;Dry;Intact 11/05/2015 10:08 AM  Dressing Change Frequency Daily 11/05/2015 10:08 AM  Site / Wound Assessment Pink;Yellow 11/05/2015 10:08 AM  % Wound base Red or Granulating 80% 11/05/2015 10:08 AM  % Wound base Yellow 20% 11/05/2015 10:08 AM  % Wound base Black 0% 11/05/2015 10:08 AM  % Wound base Other (Comment) 0% 11/05/2015 10:08 AM  Peri-wound Assessment Intact;Edema;Erythema (blanchable) 11/05/2015 10:08 AM  Wound Length (cm) 3.3 cm 11/03/2015 11:00 AM  Wound Width (cm) 1.5 cm  11/03/2015 11:00 AM  Wound Depth (cm) 1.5 cm 11/03/2015 11:00 AM  Undermining (cm) Undermining at 4:00 1.0cm and at 9:00 2.0cm. 11/03/2015 11:00 AM  Margins Unattached edges (unapproximated) 11/05/2015 10:08 AM  Closure None  11/05/2015 10:08 AM  Drainage Amount None 11/05/2015 10:08 AM  Drainage Description Serosanguineous;Purulent 11/05/2015 10:08 AM  Non-staged Wound Description Not applicable 7/35/6701 41:03 AM  Treatment Hydrotherapy (Pulse lavage);Packing (Impregnated strip) 11/05/2015 10:08 AM     Wound / Incision (Open or Dehisced) 10/25/15 Incision - Open Hand Left Palmar aspect (Active)  Dressing Type Compression wrap;Gauze (Comment);Moist to dry 11/05/2015 10:08 AM  Dressing Changed Changed 11/05/2015 10:08 AM  Dressing Status Clean;Dry;Intact 11/05/2015 10:08 AM  Dressing Change Frequency Daily 11/05/2015 10:08 AM  Site / Wound Assessment Pink;Yellow 11/05/2015 10:08 AM  % Wound base Red or Granulating 95% 11/05/2015 10:08 AM  % Wound base Yellow 5% 11/05/2015 10:08 AM  % Wound base Black 0% 11/05/2015 10:08 AM  % Wound base Other (Comment) 0% 11/05/2015 10:08 AM  Peri-wound Assessment Intact;Maceration 11/05/2015 10:08 AM  Wound Length (cm) 0.4 cm 11/03/2015 11:00 AM  Wound Width (cm) 3.5 cm 11/03/2015 11:00 AM  Wound Depth (cm) 1.8 cm 11/03/2015 11:00 AM  Undermining (cm) Undermining 4:00 - 7:00 2.0cm. 11/03/2015 11:00 AM  Margins Unattached edges (unapproximated) 11/05/2015 10:08 AM  Closure None 11/05/2015 10:08 AM  Drainage Amount Minimal 11/05/2015 10:08 AM  Drainage Description Serosanguineous;Purulent 11/05/2015 10:08 AM  Non-staged Wound Description Not applicable 0/13/1438 88:75 AM  Treatment Hydrotherapy (Pulse lavage);Packing (Impregnated strip) 11/05/2015 10:08 AM   Hydrotherapy Pulsed lavage therapy - wound location: Bilateral hands Pulsed Lavage with Suction (psi): 12 psi Pulsed Lavage with Suction - Normal Saline Used: 1000 mL (Between all wounds) Pulsed Lavage Tip: Tunneling tip   Wound Assessment and Plan  Wound Therapy - Assess/Plan/Recommendations Wound Therapy - Clinical Statement: Pt with increased sanguanous drainage today and noted yellow purulance from dorsum and palm of L hand.  Awaiting  call back from Dr. Fredna Dow to update hydrotherapy frequency. R hand is appropriate for 3x/week as it remains 100% granulation.  Wound Therapy - Functional Problem List: Decreased strength/AROM of hands/fingers for ADL's and fine motor tasks.  Factors Delaying/Impairing Wound Healing: Substance abuse;Multiple medical problems Hydrotherapy Plan: Debridement;Dressing change;Patient/family education;Pulsatile lavage with suction Wound Therapy - Frequency: 6X / week Wound Therapy - Follow Up Recommendations: Other (comment) (LTAC vs SNF) Wound Plan: See above  Wound Therapy Goals- Improve the function of patient's integumentary system by progressing the wound(s) through the phases of wound healing (inflammation - proliferation - remodeling) by: Decrease Necrotic Tissue to: 0 Decrease Necrotic Tissue - Progress: Progressing toward goal Increase Granulation Tissue to: 100 Increase Granulation Tissue - Progress: Progressing toward goal Patient/Family will be able to : complete dressing changes independently prior to d/c Patient/Family Instruction Goal - Progress: Not progressing Goals/treatment plan/discharge plan were made with and agreed upon by patient/family: No, Patient unable to participate in goals/treatment/discharge plan and family unavailable Time For Goal Achievement: 7 days Wound Therapy - Potential for Goals: Good  Goals will be updated until maximal potential achieved or discharge criteria met.  Discharge criteria: when goals achieved, discharge from hospital, MD decision/surgical intervention, no progress towards goals, refusal/missing three consecutive treatments without notification or medical reason.  GP     Rolinda Roan 11/05/2015, 10:24 AM   Rolinda Roan, PT, DPT Acute Rehabilitation Services Pager: [phone]
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
## 2015-11-05 NOTE — Progress Notes (Signed)
Sputum sample obtained and sent down to main lab without complications.  

## 2015-11-05 NOTE — Progress Notes (Addendum)
Interim Progress Note  Per RN, patient has pulled out his foley catheter mulitple times during hospitalization. He now has gross hematuria and there are concerns for penile/urethral trauma. Called urology attending (Dr. Sherryl BartersBudzyn) for possible consult and discussed patient. Urologist stated that he would not do anything differently in the management of Mr. Capasso at this time. He recommended leaving catheter in for at least 1 week, restraining patient so he cannot remove catheter, and stated the injury should heal on its own.   - Will continue monitoring urine output and color of urine (patient has gross hematuria at this time) - Leave foley catheter in place and restrain patient from pulling it out - Hgb has dropped to 7.4 this morning from 8.5 yesterday, will check CBC @ 1800. Transfuse for hgb < 7.    Beaulah Dinninghristina M Gambino, MD 11/05/2015, 2:00 PM PGY-2, [facility]

## 2015-11-06 ENCOUNTER — Inpatient Hospital Stay (HOSPITAL_COMMUNITY): Payer: Medicaid Other

## 2015-11-06 ENCOUNTER — Encounter (HOSPITAL_COMMUNITY)
Admission: AD | Disposition: A | Discharge status: Home Health | Payer: Self-pay | Source: Other Acute Inpatient Hospital | Attending: Pulmonary Disease

## 2015-11-06 ENCOUNTER — Inpatient Hospital Stay (HOSPITAL_COMMUNITY): Payer: Medicaid Other | Admitting: Certified Registered Nurse Anesthetist

## 2015-11-06 ENCOUNTER — Encounter (HOSPITAL_COMMUNITY): Payer: Self-pay | Admitting: Certified Registered Nurse Anesthetist

## 2015-11-06 HISTORY — PX: INCISION AND DRAINAGE HIP: SHX1801

## 2015-11-06 LAB — CBC WITH DIFFERENTIAL/PLATELET
Basophils Absolute: 0 10*3/uL (ref 0.0–0.1)
Basophils Relative: 0 %
EOS ABS: 0.1 10*3/uL (ref 0.0–0.7)
EOS PCT: 1 %
HCT: 23.6 % — ABNORMAL LOW (ref 39.0–52.0)
HEMOGLOBIN: 7.5 g/dL — AB (ref 13.0–17.0)
LYMPHS ABS: 2.2 10*3/uL (ref 0.7–4.0)
LYMPHS PCT: 16 %
MCH: 27 pg (ref 26.0–34.0)
MCHC: 31.8 g/dL (ref 30.0–36.0)
MCV: 84.9 fL (ref 78.0–100.0)
MONOS PCT: 11 %
Monocytes Absolute: 1.5 10*3/uL — ABNORMAL HIGH (ref 0.1–1.0)
NEUTROS PCT: 72 %
Neutro Abs: 9.9 10*3/uL — ABNORMAL HIGH (ref 1.7–7.7)
Platelets: 471 10*3/uL — ABNORMAL HIGH (ref 150–400)
RBC: 2.78 MIL/uL — ABNORMAL LOW (ref 4.22–5.81)
RDW: 14.8 % (ref 11.5–15.5)
WBC: 13.7 10*3/uL — ABNORMAL HIGH (ref 4.0–10.5)

## 2015-11-06 LAB — COMPREHENSIVE METABOLIC PANEL
ALBUMIN: 1.7 g/dL — AB (ref 3.5–5.0)
ALT: 31 U/L (ref 17–63)
ANION GAP: 6 (ref 5–15)
AST: 55 U/L — ABNORMAL HIGH (ref 15–41)
Alkaline Phosphatase: 128 U/L — ABNORMAL HIGH (ref 38–126)
BUN: 11 mg/dL (ref 6–20)
CHLORIDE: 95 mmol/L — AB (ref 101–111)
CO2: 30 mmol/L (ref 22–32)
Calcium: 8.3 mg/dL — ABNORMAL LOW (ref 8.9–10.3)
Creatinine, Ser: 0.58 mg/dL — ABNORMAL LOW (ref 0.61–1.24)
GFR calc non Af Amer: >60 mL/min (ref >60)
GLUCOSE: 110 mg/dL — AB (ref 65–99)
Potassium: 3.8 mmol/L (ref 3.5–5.1)
SODIUM: 131 mmol/L — AB (ref 135–145)
Total Bilirubin: 1.6 mg/dL — ABNORMAL HIGH (ref 0.3–1.2)
Total Protein: 8.2 g/dL — ABNORMAL HIGH (ref 6.5–8.1)

## 2015-11-06 LAB — GLUCOSE, CAPILLARY
GLUCOSE-CAPILLARY: 110 mg/dL — AB (ref 65–99)
GLUCOSE-CAPILLARY: 101 mg/dL — AB (ref 65–99)
GLUCOSE-CAPILLARY: 94 mg/dL (ref 65–99)
GLUCOSE-CAPILLARY: 103 mg/dL — AB (ref 65–99)
Glucose-Capillary: 117 mg/dL — ABNORMAL HIGH (ref 65–99)
Glucose-Capillary: 96 mg/dL (ref 65–99)

## 2015-11-06 LAB — URINE CULTURE: CULTURE: NO GROWTH

## 2015-11-06 LAB — PREPARE RBC (CROSSMATCH)

## 2015-11-06 LAB — AEROBIC/ANAEROBIC CULTURE W GRAM STAIN (SURGICAL/DEEP WOUND)

## 2015-11-06 LAB — MAGNESIUM: Magnesium: 1.7 mg/dL (ref 1.7–2.4)

## 2015-11-06 LAB — TYPE AND SCREEN
ABO/RH(D): A NEG
ANTIBODY SCREEN: NEGATIVE

## 2015-11-06 LAB — AEROBIC/ANAEROBIC CULTURE (SURGICAL/DEEP WOUND)

## 2015-11-06 SURGERY — IRRIGATION AND DEBRIDEMENT HIP
Anesthesia: General | Site: Hip | Laterality: Right

## 2015-11-06 MED ORDER — FENTANYL CITRATE (PF) 250 MCG/5ML IJ SOLN
INTRAMUSCULAR | Status: AC
Start: 2015-11-06 — End: 2015-11-06
  Filled 2015-11-06: qty 5

## 2015-11-06 MED ORDER — RIFAMPIN 300 MG PO CAPS
600.0000 mg | ORAL_CAPSULE | Freq: Every day | ORAL | Status: DC
  Administered 2015-11-06 – 2015-11-14 (×9): 600 mg via ORAL
  Filled 2015-11-06 (×10): qty 2

## 2015-11-06 MED ORDER — POVIDONE-IODINE 10 % EX SWAB
2.0000 "application " | Freq: Once | CUTANEOUS | Status: AC
  Administered 2015-11-06: 2 via TOPICAL

## 2015-11-06 MED ORDER — FENTANYL CITRATE (PF) 250 MCG/5ML IJ SOLN
INTRAMUSCULAR | Status: AC
  Filled 2015-11-06: qty 5

## 2015-11-06 MED ORDER — ROCURONIUM BROMIDE 100 MG/10ML IV SOLN
INTRAVENOUS | Status: DC | PRN
  Administered 2015-11-06 (×3): 50 mg via INTRAVENOUS

## 2015-11-06 MED ORDER — CEFAZOLIN SODIUM-DEXTROSE 2-4 GM/100ML-% IV SOLN
2.0000 g | INTRAVENOUS | Status: AC
  Administered 2015-11-06: 2 g via INTRAVENOUS
  Filled 2015-11-06: qty 100

## 2015-11-06 MED ORDER — ACETAMINOPHEN 500 MG PO TABS
1000.0000 mg | ORAL_TABLET | Freq: Once | ORAL | Status: AC
  Administered 2015-11-06: 1000 mg via ORAL
  Filled 2015-11-06: qty 2

## 2015-11-06 MED ORDER — SODIUM CHLORIDE 0.9 % IR SOLN
Status: DC | PRN
  Administered 2015-11-06: 3000 mL

## 2015-11-06 MED ORDER — CHLORHEXIDINE GLUCONATE 4 % EX LIQD
60.0000 mL | Freq: Once | CUTANEOUS | Status: AC
  Administered 2015-11-06: 4 via TOPICAL

## 2015-11-06 MED ORDER — MIDAZOLAM HCL 2 MG/2ML IJ SOLN
INTRAMUSCULAR | Status: AC
  Filled 2015-11-06: qty 2

## 2015-11-06 MED ORDER — SODIUM CHLORIDE 0.9 % IV SOLN
600.0000 mg | Freq: Three times a day (TID) | INTRAVENOUS | Status: DC
  Administered 2015-11-06 – 2015-11-19 (×36): 600 mg via INTRAVENOUS
  Filled 2015-11-06 (×40): qty 600

## 2015-11-06 MED ORDER — SODIUM CHLORIDE 0.9 % IV SOLN: Freq: Once | INTRAVENOUS | Status: DC

## 2015-11-06 MED ORDER — PROPOFOL 10 MG/ML IV BOLUS
INTRAVENOUS | Status: AC
Start: 2015-11-06 — End: 2015-11-06
  Filled 2015-11-06: qty 40

## 2015-11-06 MED ORDER — SODIUM CHLORIDE 0.9 % IV SOLN
600.0000 mg | Freq: Two times a day (BID) | INTRAVENOUS | Status: DC
  Administered 2015-11-06: 600 mg via INTRAVENOUS
  Filled 2015-11-06 (×2): qty 600

## 2015-11-06 MED ORDER — VITAL AF 1.2 CAL PO LIQD
1000.0000 mL | ORAL | Status: DC
Start: 2015-11-06 — End: 2015-11-14
  Administered 2015-11-06 – 2015-11-11 (×7): 1000 mL
  Filled 2015-11-06 (×8): qty 1000

## 2015-11-06 MED ORDER — BUPIVACAINE-EPINEPHRINE (PF) 0.5% -1:200000 IJ SOLN
INTRAMUSCULAR | Status: AC
  Filled 2015-11-06: qty 30

## 2015-11-06 MED ORDER — LACTATED RINGERS IV SOLN
INTRAVENOUS | Status: DC | PRN
  Administered 2015-11-06: 13:00:00 via INTRAVENOUS

## 2015-11-06 MED ORDER — FENTANYL CITRATE (PF) 100 MCG/2ML IJ SOLN
INTRAMUSCULAR | Status: DC | PRN
  Administered 2015-11-06: 250 ug via INTRAVENOUS
  Administered 2015-11-06: 100 ug via INTRAVENOUS

## 2015-11-06 MED ORDER — MIDAZOLAM HCL 5 MG/5ML IJ SOLN
INTRAMUSCULAR | Status: DC | PRN
  Administered 2015-11-06: 2 mg via INTRAVENOUS

## 2015-11-06 MED ORDER — DEXTROSE-NACL 5-0.45 % IV SOLN
100.0000 mL/h | INTRAVENOUS | Status: DC
  Administered 2015-11-06: 13:00:00 via INTRAVENOUS
  Administered 2015-11-06: 100 mL/h via INTRAVENOUS

## 2015-11-06 MED ORDER — 0.9 % SODIUM CHLORIDE (POUR BTL) OPTIME
TOPICAL | Status: DC | PRN
  Administered 2015-11-06: 1000 mL

## 2015-11-06 SURGICAL SUPPLY — 53 items
CLOSURE STERI-STRIP 1/2X4 (GAUZE/BANDAGES/DRESSINGS) ×1
CLSR STERI-STRIP ANTIMIC 1/2X4 (GAUZE/BANDAGES/DRESSINGS) ×3 IMPLANT
COVER SURGICAL LIGHT HANDLE (MISCELLANEOUS) ×3 IMPLANT
DRAPE IMP U-DRAPE 54X76 (DRAPES) ×3 IMPLANT
DRAPE ORTHO SPLIT 77X108 STRL (DRAPES) ×6
DRAPE SURG ORHT 6 SPLT 77X108 (DRAPES) ×2 IMPLANT
DRAPE U-SHAPE 47X51 STRL (DRAPES) ×5 IMPLANT
DRSG AQUACEL AG ADV 3.5X 6 (GAUZE/BANDAGES/DRESSINGS) ×2 IMPLANT
DRSG AQUACEL AG ADV 3.5X10 (GAUZE/BANDAGES/DRESSINGS) ×3 IMPLANT
DRSG MEPILEX BORDER 4X8 (GAUZE/BANDAGES/DRESSINGS) ×3 IMPLANT
DURAPREP 26ML APPLICATOR (WOUND CARE) ×3 IMPLANT
ELECT CAUTERY BLADE 6.4 (BLADE) ×3 IMPLANT
ELECT REM PT RETURN 9FT ADLT (ELECTROSURGICAL) ×3
ELECTRODE REM PT RTRN 9FT ADLT (ELECTROSURGICAL) ×1 IMPLANT
EVACUATOR 3/16  PVC DRAIN (DRAIN) ×2
EVACUATOR 3/16 PVC DRAIN (DRAIN) IMPLANT
FACESHIELD WRAPAROUND (MASK) IMPLANT
FACESHIELD WRAPAROUND OR TEAM (MASK) ×1 IMPLANT
GLOVE BIO SURGEON STRL SZ7 (GLOVE) ×3 IMPLANT
GLOVE BIO SURGEON STRL SZ7.5 (GLOVE) ×4 IMPLANT
GLOVE BIOGEL PI IND STRL 7.0 (GLOVE) ×1 IMPLANT
GLOVE BIOGEL PI IND STRL 8 (GLOVE) ×1 IMPLANT
GLOVE BIOGEL PI INDICATOR 7.0 (GLOVE) ×4
GLOVE BIOGEL PI INDICATOR 8 (GLOVE) ×2
GLOVE SURG SS PI 6.5 STRL IVOR (GLOVE) ×4 IMPLANT
GOWN STRL REUS W/ TWL LRG LVL3 (GOWN DISPOSABLE) ×1 IMPLANT
GOWN STRL REUS W/ TWL XL LVL3 (GOWN DISPOSABLE) ×1 IMPLANT
GOWN STRL REUS W/TWL LRG LVL3 (GOWN DISPOSABLE) ×3
GOWN STRL REUS W/TWL XL LVL3 (GOWN DISPOSABLE) ×3
HANDPIECE INTERPULSE COAX TIP (DISPOSABLE)
KIT BASIN OR (CUSTOM PROCEDURE TRAY) ×3 IMPLANT
KIT ROOM TURNOVER OR (KITS) ×3 IMPLANT
MANIFOLD NEPTUNE II (INSTRUMENTS) ×3 IMPLANT
NS IRRIG 1000ML POUR BTL (IV SOLUTION) ×3 IMPLANT
PACK TOTAL JOINT (CUSTOM PROCEDURE TRAY) ×3 IMPLANT
PACK UNIVERSAL I (CUSTOM PROCEDURE TRAY) ×3 IMPLANT
PAD ARMBOARD 7.5X6 YLW CONV (MISCELLANEOUS) ×6 IMPLANT
SET HNDPC FAN SPRY TIP SCT (DISPOSABLE) ×1 IMPLANT
SPONGE GAUZE 4X4 12PLY STER LF (GAUZE/BANDAGES/DRESSINGS) ×2 IMPLANT
SUT ETHILON 3 0 FSL (SUTURE) ×2 IMPLANT
SUT ETHILON 3 0 PS 1 (SUTURE) ×2 IMPLANT
SUT FIBERWIRE #2 38 REV NDL BL (SUTURE)
SUT MNCRL AB 4-0 PS2 18 (SUTURE) ×1 IMPLANT
SUT MON AB 2-0 CT1 36 (SUTURE) ×1 IMPLANT
SUT PDS AB 2-0 CT1 27 (SUTURE) ×2 IMPLANT
SUT VIC AB 0 CT1 27 (SUTURE)
SUT VIC AB 0 CT1 27XBRD ANBCTR (SUTURE) ×1 IMPLANT
SUTURE FIBERWR#2 38 REV NDL BL (SUTURE) IMPLANT
TOWEL OR 17X24 6PK STRL BLUE (TOWEL DISPOSABLE) ×3 IMPLANT
TOWEL OR 17X26 10 PK STRL BLUE (TOWEL DISPOSABLE) ×3 IMPLANT
TOWEL OR NON WOVEN STRL DISP B (DISPOSABLE) ×3 IMPLANT
TUBING CYSTO DISP (UROLOGICAL SUPPLIES) ×2 IMPLANT
WATER STERILE IRR 1000ML POUR (IV SOLUTION) ×6 IMPLANT

## 2015-11-06 NOTE — Progress Notes (Signed)
Ceftaroline Dose Adjustments:  D/w Dr. Orvan Falconerampbell about increasing the dose of ceftaroline to q8h for bacteremia. This dose is recommended for off indication use of ceftaroline for bacteremia treatment.   Ulyses SouthwardMinh Kariem Wolfson, PharmD Pager: [phone] 11/06/2015 3:31 PM

## 2015-11-06 NOTE — Anesthesia Preprocedure Evaluation (Signed)
Anesthesia Evaluation  Patient identified by MRN, date of birth, ID band Patient unresponsive    Reviewed: Allergy & Precautions, NPO status , Patient's Chart, lab work & pertinent test results  Airway Mallampati: Trach  TM Distance: >3 FB Neck ROM: Full    Dental no notable dental hx.    Pulmonary neg pulmonary ROS,    Pulmonary exam normal breath sounds clear to auscultation       Cardiovascular negative cardio ROS Normal cardiovascular exam Rhythm:Regular Rate:Normal  Echo 10/24/2015 - Left ventricle: The cavity size was normal. Wall thickness was normal. Systolic function was normal. The estimated ejection fraction was in the range of 60% to 65%. Wall motion was normal; there were no regional wall motion abnormalities. - Aortic valve: No evidence of vegetation. There was no stenosis. - Aorta: Normal caliber thoracic aorta. - Mitral valve: No evidence of vegetation. - Left atrium: No evidence of thrombus in the atrial cavity or appendage. - Right ventricle: The cavity size was normal. Systolic function was normal. - Right atrium: No evidence of thrombus in the atrial cavity or appendage. - Atrial septum: No defect or patent foramen ovale was identified. - Tricuspid valve: No evidence of vegetation. - Pulmonic valve: No evidence of vegetation.  Impressions: - No evidence of endocarditis.   Neuro/Psych negative neurological ROS  negative psych ROS   GI/Hepatic negative GI ROS, Neg liver ROS,   Endo/Other  negative endocrine ROS  Renal/GU negative Renal ROS  negative genitourinary   Musculoskeletal negative musculoskeletal ROS (+)   Abdominal   Peds negative pediatric ROS (+)  Hematology negative hematology ROS (+)   Anesthesia Other Findings   Reproductive/Obstetrics negative OB ROS                             Lab Results  Component Value Date   WBC 13.7* 11/06/2015   HGB 7.5*  11/06/2015   HCT 23.6* 11/06/2015   MCV 84.9 11/06/2015   PLT 471* 11/06/2015   Lab Results  Component Value Date   CREATININE 0.58* 11/06/2015   BUN 11 11/06/2015   NA 131* 11/06/2015   K 3.8 11/06/2015   CL 95* 11/06/2015   CO2 30 11/06/2015    Anesthesia Physical  Anesthesia Plan  ASA: III  Anesthesia Plan: General   Post-op Pain Management:    Induction: Inhalational  Airway Management Planned: Tracheostomy  Additional Equipment:   Intra-op Plan:   Post-operative Plan: Post-operative intubation/ventilation  Informed Consent: I have reviewed the patients History and Physical, chart, labs and discussed the procedure including the risks, benefits and alternatives for the proposed anesthesia with the patient or authorized representative who has indicated his/her understanding and acceptance.   Dental advisory given  Plan Discussed with: CRNA  Anesthesia Plan Comments:         Anesthesia Quick Evaluation

## 2015-11-06 NOTE — Progress Notes (Deleted)
Returned from OR.

## 2015-11-06 NOTE — Progress Notes (Signed)
Patient turns and repositioned in bed every 2 hours but due to restlessness patient moves continuously around in bed.

## 2015-11-06 NOTE — Progress Notes (Signed)
      [address]       [place] [id number]             [phone]      Patient seen earlier this AM prior to going to OR.  He was sedated but responsive to pain. He was given valium just prior to the dressing change and his fentanyl was bolused as well.  The dressing was changed. The wound is about 80% granulating tissue, There was no necrotic tissue to debride. Gently probing the area did not reveal any undrained collections.  Continue with dressing changes daily, consider change to a VAC at some point. May ultimately need a sternectomy and pec flaps.  Salvatore DecentSteven C. Dorris FetchHendrickson, MD [facility] [phone]

## 2015-11-06 NOTE — Progress Notes (Signed)
eLink Physician-Brief Progress Note Patient Name: Jolene Schimkerey Matranga DOB: [birth year] MRN: [id number]   Date of Service  11/06/2015  HPI/Events of Note  Restraints renewed til 9 AM on 7/12.  eICU Interventions       Intervention Category Major Interventions: Other:  YACOUB,WESAM 11/06/2015, 9:51 PM

## 2015-11-06 NOTE — Op Note (Signed)
10/22/2015 - 11/06/2015  2:26 PM  PATIENT:  Kevin Austin    PRE-OPERATIVE DIAGNOSIS:  Hip Abscess  POST-OPERATIVE DIAGNOSIS:  Same  PROCEDURE:  IRRIGATION AND DEBRIDEMENT HIP  SURGEON:  Mackenzye Mackel, Jewel BaizeIMOTHY D, MD  ASSISTANT: Aquilla HackerHenry Martensen, PA-C, She was present and scrubbed throughout the case, critical for completion in a timely fashion, and for retraction, instrumentation, and closure.   ANESTHESIA:   gen  PREOPERATIVE INDICATIONS:  Kevin Austin is a  25 y.o. male with a diagnosis of Hip Abscess who failed conservative measures and elected for surgical management.    The risks benefits and alternatives were discussed with the patient preoperatively including but not limited to the risks of infection, bleeding, nerve injury, cardiopulmonary complications, the need for revision surgery, among others, and the patient was willing to proceed.  OPERATIVE IMPLANTS: none  OPERATIVE FINDINGS: purulent fluid within the hip joint and anterior to the joint  BLOOD LOSS: min  COMPLICATIONS: none  TOURNIQUET TIME: none  OPERATIVE PROCEDURE:  Patient was identified in the preoperative holding area and site was marked by me He was transported to the operating theater and placed on the table in supine position taking care to pad all bony prominences. After a preincinduction time out anesthesia was induced. The right lower extremity was prepped and draped in normal sterile fashion and a pre-incision timeout was performed. He received ancef for preoperative antibiotics.   I made a anterior incision just lateral and distal to his anterior superior iliac spine carrying this distally in line with the tensor fascia muscle. I dissected down to the tensor muscle and incised this in line with the incision.  Next I mobilized a plane anterior to the tensor muscle and identified the deep fascia I identified and protected his vasculature inferiorly I then bluntly probed anterior to the hip joint I did find some  purulence here that I develop I sent cultures from this anterior aspect.  I performed a debridement of any devitalized tissue this was an excisional debridement with a curet. I then made an incision in his anterior joint capsule immediately purulent fluid was expressible from this.  I performed an excisional debridement of portion of his joint capsule. I then thoroughly irrigated his entire incision with 3 L of saline.  I then placed a drain partially in the hip joint capsule and partially in the anterior space in front of the hip. I closed the superficial fascia with a PDS followed by a nylon stitch and the skin sterile dressings were applied same strain had good suction he was awoken and returned to his room in stable condition  POST OPERATIVE PLAN: continue critical care    This note was generated using a template and dragon dictation system. In light of that, I have reviewed the note and all aspects of it are applicable to this case. Any dictation errors are due to the computerized dictation system.
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
## 2015-11-06 NOTE — Progress Notes (Signed)
Nutrition Follow-up  DOCUMENTATION CODES:   Not applicable  INTERVENTION:  -When medically appropriate, begin Vital 1.2 @ 10780mL/hr, utilize PEPUP protocol  NUTRITION DIAGNOSIS:   Inadequate oral intake related to inability to eat as evidenced by NPO status. -Ongoing  GOAL:   Patient will meet greater than or equal to 90% of their needs -Will meet @ goal  MONITOR:   Vent status, Labs, Weight trends, TF tolerance, Skin, I & O's  REASON FOR ASSESSMENT:   Consult Enteral/tube feeding initiation and management  ASSESSMENT:   25 year old admitted with severe sepsis, multiple septic emboli. 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  Patient is currently intubated on ventilator support MV: 11 L/min Temp (24hrs), Avg:101 F (38.3 C), Min:97.7 F (36.5 C), Max:102.5 F (39.2 C) TF was held at midnight for surgery today Off Propofol. Tolerating some SBTs with copious secretions. Wt down 1kg since admit. Labs and Medications reviewed: Na 131, Phos 5.0, Bilirubin 1.6 Glycolax, Versed Drip, Fentanyl Drip D5 @ 110 --> 449 calories  Diet Order:  Diet NPO time specified Diet NPO time specified Except for: Sips with Meds  Skin:  Wound (see comment) (abscesses to foot and hand, s/p I&D 6/27)  Last BM:  PTA  Height:   Ht Readings from Last 1 Encounters:  10/22/15 5\' 11"  (1.803 m)    Weight:   Wt Readings from Last 1 Encounters:  11/06/15 181 lb 3.5 oz (82.2 kg)    Ideal Body Weight:  78.2 kg  BMI:  Body mass index is 25.29 kg/(m^2).  Estimated Nutritional Needs:   Kcal:  2380  Protein:  140-160 gm  Fluid:  2.5 L  EDUCATION NEEDS:   No education needs identified at this time  Dionne AnoWilliam M. Israel Wunder, MS, RD LDN Inpatient Clinical Dietitian Pager [phone]

## 2015-11-06 NOTE — Transfer of Care (Signed)
Immediate Anesthesia Transfer of Care Note  Patient: Kevin Austin  Procedure(s) Performed: Procedure(s): IRRIGATION AND DEBRIDEMENT HIP (Right)  Patient Location: ICU  Anesthesia Type:General  Level of Consciousness: unresponsive, sedated  Airway & Oxygen Therapy: Patient placed on Ventilator (see vital sign flow sheet for setting) via tracheostomy tube  Post-op Assessment: Report given to RN and Post -op Vital signs reviewed and stable  Post vital signs: Reviewed and stable  Last Vitals:  Filed Vitals:   11/06/15 1136 11/06/15 1200  BP:  152/51  Pulse:  132  Temp: 38.7 C   Resp:  19    Last Pain:  Filed Vitals:   11/06/15 1300  PainSc: 10-Worst pain ever         Complications: No apparent anesthesia complications

## 2015-11-06 NOTE — Progress Notes (Signed)
PT Cancellation Note  Patient Details Name: Kevin Austin MRN: [id number] DOB: 1990/08/10   Cancelled Treatment:    Reason Eval/Treat Not Completed: Patient at procedure or test/unavailable. Pt to OR for I&D of hip. Will continue to follow.    Conni SlipperKirkman, Jeryn Cerney 11/06/2015, 2:14 PM   Conni SlipperLaura Calli Bashor, PT, DPT Acute Rehabilitation Services Pager: [phone]

## 2015-11-06 NOTE — Progress Notes (Signed)
[place] for Infectious Disease    Date of Admission:  10/22/2015   Day 16 on vancomycin  Principal Problem:   MRSA bacteremia Active Problems:   Acute respiratory failure (HCC)   Abscess   Altered mental status   Encounter for central line placement   Bilateral pneumonia   Sternal osteomyelitis ([place])   HCAP (healthcare-associated pneumonia)   Acute pulmonary edema ([place])   . acetaminophen  1,000 mg Oral Once  . antiseptic oral rinse  7 mL Mouth Rinse QID  .  ceFAZolin (ANCEF) IV  2 g Intravenous To SSTC  . chlorhexidine gluconate (SAGE KIT)  15 mL Mouth Rinse BID  . diazepam  2 mg Per Tube Q8H  . famotidine (PEPCID) IV  20 mg Intravenous Q12H  . furosemide  20 mg Intravenous Q12H  . methadone  20 mg Per Tube Q8H  . polyethylene glycol  17 g Oral Daily  . QUEtiapine  50 mg Per Tube Q12H  . sennosides  5 mL Per Tube BID  . sodium chloride  1,000 mL Intravenous Once  . vancomycin  1,250 mg Intravenous Q8H    SUBJECTIVE: Mr. Kimberlin remains sedated on ventilator support.  Review of Systems: Review of Systems  Unable to perform ROS: intubated    History reviewed. No pertinent past medical history.  Social History  Substance Use Topics  . Smoking status: None  . Smokeless tobacco: None  . Alcohol Use: None    History reviewed. No pertinent family history. Allergies  Allergen Reactions  . Ketorolac Nausea Only    Per [facility] records  . Tramadol Nausea Only    Per [facility] records    OBJECTIVE: Filed Vitals:   11/06/15 0858 11/06/15 0900 11/06/15 1000 11/06/15 1100  BP:  134/74 137/77 126/67  Pulse:  110 113 114  Temp: 97.7 F (36.5 C)     TempSrc: Rectal     Resp:  20 20   Height:      Weight:      SpO2:  98% 99% 98%   Body mass index is 25.29 kg/(m^2).  Physical Exam GENERAL- Sedated, attends to voice and follows commands HEENT- PERRL, normal conjuntivae CARDIAC- Tachycardic, no murmurs, rubs or gallops. RESP- Bilateral  crackles, loud upper airway secretions and ventilator assisted breaths ABDOMEN- Soft, no guarding or rebound, normoactive bowel sounds present NEURO- Moving all extremities EXTREMITIES- hands and feet in ACE wraps, trace pedal edema   Lab Results Lab Results  Component Value Date   WBC 13.7* 11/06/2015   HGB 7.5* 11/06/2015   HCT 23.6* 11/06/2015   MCV 84.9 11/06/2015   PLT 471* 11/06/2015    Lab Results  Component Value Date   CREATININE 0.58* 11/06/2015   BUN 11 11/06/2015   NA 131* 11/06/2015   K 3.8 11/06/2015   CL 95* 11/06/2015   CO2 30 11/06/2015    Lab Results  Component Value Date   ALT 31 11/06/2015   AST 55* 11/06/2015   ALKPHOS 128* 11/06/2015   BILITOT 1.6* 11/06/2015     Microbiology: Recent Results (from the past 240 hour(s))  Body fluid culture     Status: None   Collection Time: 10/27/15  1:57 PM  Result Value Ref Range Status   Specimen Description FLUID ANKLE  Final   Special Requests Immunocompromised  Final   Gram Stain   Final    ABUNDANT WBC PRESENT, PREDOMINANTLY PMN MODERATE GRAM POSITIVE COCCI IN CLUSTERS  Culture   Final    MODERATE METHICILLIN RESISTANT STAPHYLOCOCCUS AUREUS   Report Status 10/30/2015 FINAL  Final   Organism ID, Bacteria METHICILLIN RESISTANT STAPHYLOCOCCUS AUREUS  Final      Susceptibility   Methicillin resistant staphylococcus aureus - MIC*    CIPROFLOXACIN <=0.5 SENSITIVE Sensitive     ERYTHROMYCIN >=8 RESISTANT Resistant     GENTAMICIN <=0.5 SENSITIVE Sensitive     OXACILLIN >=4 RESISTANT Resistant     TETRACYCLINE <=1 SENSITIVE Sensitive     VANCOMYCIN <=0.5 SENSITIVE Sensitive     TRIMETH/SULFA <=10 SENSITIVE Sensitive     CLINDAMYCIN <=0.25 SENSITIVE Sensitive     RIFAMPIN <=0.5 SENSITIVE Sensitive     Inducible Clindamycin NEGATIVE Sensitive     * MODERATE METHICILLIN RESISTANT STAPHYLOCOCCUS AUREUS  Aerobic/Anaerobic Culture (surgical/deep wound)     Status: None   Collection Time: 11/01/15  4:39 PM    Result Value Ref Range Status   Specimen Description ABSCESS  Final   Special Requests LEFT STERNUM PATIENT ON FOLLOWING VANC  Final   Gram Stain   Final    ABUNDANT WBC PRESENT,BOTH PMN AND MONONUCLEAR FEW GRAM POSITIVE COCCI IN CLUSTERS    Culture   Final    FEW METHICILLIN RESISTANT STAPHYLOCOCCUS AUREUS NO ANAEROBES ISOLATED    Report Status 11/06/2015 FINAL  Final   Organism ID, Bacteria METHICILLIN RESISTANT STAPHYLOCOCCUS AUREUS  Final      Susceptibility   Methicillin resistant staphylococcus aureus - MIC*    CIPROFLOXACIN <=0.5 SENSITIVE Sensitive     ERYTHROMYCIN >=8 RESISTANT Resistant     GENTAMICIN <=0.5 SENSITIVE Sensitive     OXACILLIN >=4 RESISTANT Resistant     TETRACYCLINE <=1 SENSITIVE Sensitive     VANCOMYCIN <=0.5 SENSITIVE Sensitive     TRIMETH/SULFA <=10 SENSITIVE Sensitive     CLINDAMYCIN <=0.25 SENSITIVE Sensitive     RIFAMPIN <=0.5 SENSITIVE Sensitive     Inducible Clindamycin NEGATIVE Sensitive     * FEW METHICILLIN RESISTANT STAPHYLOCOCCUS AUREUS  Culture, respiratory (NON-Expectorated)     Status: None (Preliminary result)   Collection Time: 11/05/15  3:29 PM  Result Value Ref Range Status   Specimen Description TRACHEAL ASPIRATE  Final   Special Requests NONE  Final   Gram Stain   Final    ABUNDANT WBC PRESENT,BOTH PMN AND MONONUCLEAR ABUNDANT GRAM POSITIVE COCCI IN PAIRS FEW GRAM NEGATIVE RODS    Culture PENDING  Incomplete   Report Status PENDING  Incomplete     ASSESSMENT: Disseminated MRSA bacteremia: Mr. Kevin Austin is increasingly febrile today now 2 weeks into treatment for a disseminated MRSA with extensive abscesses, osteomyelitis of the sternum and lumbosacral spine, and septic arthritis of the right hip. His vancomycin dosing was also subtherapeutic since the last dose adjustment which was at the end of last week. This will need close follow up and if he cannot reach on a stable therapeutic dose we could consider an alternative  treatment.  Worsening Fever: His high fevers could be from his ongoing infection with numerous localized sources for infection, but we are also checking for secondary infections with sputum, blood, and urine cultures that can cause ICU fever. He tolerated several hours SBT yesterday but he has had an unchanged amount of copious, thick respiratory secretions so there is not a very strong evidence of new healthcare associated pneumonia.   PLAN: 1. Continue vancomycin, consider repeat trough after 3 doses 2. Follow up repeat sputum, blood, and urine cultures 3. Agree with continued I&D  of sizeable abscesses  Collier Salina, MD PGY-II Internal Medicine Resident Pager# [phone] 11/06/2015, 11:29 AM   Addended plan: After discussion with staff and reviewing that he has failed to reach therapeutic vancomycin levels despite dose adjustment since several days ago, we will plan to change therapy after reassessment today.

## 2015-11-06 NOTE — Progress Notes (Signed)
Returned from OR.

## 2015-11-07 ENCOUNTER — Encounter (HOSPITAL_COMMUNITY): Payer: Self-pay | Admitting: Orthopedic Surgery

## 2015-11-07 ENCOUNTER — Inpatient Hospital Stay (HOSPITAL_COMMUNITY): Payer: Medicaid Other

## 2015-11-07 DIAGNOSIS — M009 Pyogenic arthritis, unspecified: Secondary | ICD-10-CM | POA: Diagnosis present

## 2015-11-07 DIAGNOSIS — D72829 Elevated white blood cell count, unspecified: Secondary | ICD-10-CM

## 2015-11-07 DIAGNOSIS — R000 Tachycardia, unspecified: Secondary | ICD-10-CM

## 2015-11-07 DIAGNOSIS — Z9889 Other specified postprocedural states: Secondary | ICD-10-CM

## 2015-11-07 LAB — GLUCOSE, CAPILLARY
GLUCOSE-CAPILLARY: 108 mg/dL — AB (ref 65–99)
GLUCOSE-CAPILLARY: 119 mg/dL — AB (ref 65–99)
GLUCOSE-CAPILLARY: 120 mg/dL — AB (ref 65–99)
Glucose-Capillary: 110 mg/dL — ABNORMAL HIGH (ref 65–99)
Glucose-Capillary: 110 mg/dL — ABNORMAL HIGH (ref 65–99)
Glucose-Capillary: 126 mg/dL — ABNORMAL HIGH (ref 65–99)

## 2015-11-07 LAB — CBC
HEMATOCRIT: 22.7 % — AB (ref 39.0–52.0)
Hemoglobin: 7.5 g/dL — ABNORMAL LOW (ref 13.0–17.0)
MCH: 27.6 pg (ref 26.0–34.0)
MCHC: 33 g/dL (ref 30.0–36.0)
MCV: 83.5 fL (ref 78.0–100.0)
PLATELETS: 462 10*3/uL — AB (ref 150–400)
RBC: 2.72 MIL/uL — ABNORMAL LOW (ref 4.22–5.81)
RDW: 14.7 % (ref 11.5–15.5)
WBC: 15 10*3/uL — AB (ref 4.0–10.5)

## 2015-11-07 LAB — BASIC METABOLIC PANEL
ANION GAP: 9 (ref 5–15)
BUN: 12 mg/dL (ref 6–20)
CALCIUM: 8.5 mg/dL — AB (ref 8.9–10.3)
CO2: 28 mmol/L (ref 22–32)
Chloride: 96 mmol/L — ABNORMAL LOW (ref 101–111)
Creatinine, Ser: 0.61 mg/dL (ref 0.61–1.24)
Glucose, Bld: 126 mg/dL — ABNORMAL HIGH (ref 65–99)
Potassium: 4.1 mmol/L (ref 3.5–5.1)
Sodium: 133 mmol/L — ABNORMAL LOW (ref 135–145)

## 2015-11-07 LAB — CULTURE, RESPIRATORY W GRAM STAIN

## 2015-11-07 LAB — MAGNESIUM: MAGNESIUM: 1.7 mg/dL (ref 1.7–2.4)

## 2015-11-07 LAB — CULTURE, RESPIRATORY

## 2015-11-07 MED ORDER — FUROSEMIDE 8 MG/ML PO SOLN
40.0000 mg | Freq: Every day | ORAL | Status: DC
  Filled 2015-11-07: qty 5

## 2015-11-07 MED ORDER — SODIUM CHLORIDE 0.9 % IV SOLN
INTRAVENOUS | Status: DC
  Administered 2015-11-07 – 2015-11-08 (×2): via INTRAVENOUS

## 2015-11-07 MED ORDER — PANTOPRAZOLE SODIUM 40 MG PO PACK
40.0000 mg | PACK | ORAL | Status: DC
  Administered 2015-11-08 – 2015-11-11 (×4): 40 mg
  Filled 2015-11-07 (×4): qty 20

## 2015-11-07 MED ORDER — EPINEPHRINE HCL 1 MG/ML IJ SOLN: 0.0000 ug/min | INTRAVENOUS | Status: DC

## 2015-11-07 MED ORDER — SODIUM CHLORIDE 0.9 % IV SOLN
1.0000 mg/h | INTRAVENOUS | Status: DC
  Administered 2015-11-07 (×3): 10 mg/h via INTRAVENOUS
  Administered 2015-11-08: 5 mg/h via INTRAVENOUS
  Filled 2015-11-07 (×5): qty 20

## 2015-11-07 MED ORDER — FENTANYL BOLUS VIA INFUSION
25.0000 ug | INTRAVENOUS | Status: DC | PRN
  Administered 2015-11-08 (×2): 100 ug via INTRAVENOUS
  Filled 2015-11-07: qty 100

## 2015-11-07 NOTE — Progress Notes (Signed)
Paged the resident approximately 1430 the patient received lasix with no urine output. The catheter was flushed will still no results. Bloody drainage and clots were noted all around the foley, from where the pt previously removed his foley. The pt's bladder was scanned which showed he had >96399mls. The resident recommended replacing the foley. The foley was replaced with an [id number] french (the same as the previous catheter). The red seal had to be broken since this was a special catheter. There was roughly 900 ml return of bloody urine. Date and time placed on the bag. The resident was updated once the new foley was inserted.

## 2015-11-07 NOTE — Progress Notes (Signed)
Physical Therapy Wound Treatment Patient Details  Name: Kevin Austin MRN: [id number] Date of Birth: [birth year]  Today's Date: 11/07/2015 Time: 9977-4142 Time Calculation (min): 45 min  Subjective  Subjective: Pt sedated during hydro session however opened eyes a few times.  Patient and Family Stated Goals: None stated Date of Onset:  (Unknown) Prior Treatments: I&D 6/27  Pain Score:  Pt sedated during session and did not indicate any pain.   Wound Assessment  Wound / Incision (Open or Dehisced) 10/25/15 Incision - Open Hand Right Dorsal aspect - Ulnar side (Active)  Dressing Type Compression wrap;Moist to dry;Gauze (Comment) 11/07/2015  1:16 PM  Dressing Changed Changed 11/07/2015  1:16 PM  Dressing Status Clean;Dry;Intact 11/07/2015  1:16 PM  Dressing Change Frequency Daily 11/07/2015  1:16 PM  Site / Wound Assessment Red;Bleeding 11/07/2015  1:16 PM  % Wound base Red or Granulating 100% 11/07/2015  1:16 PM  % Wound base Yellow 0% 11/07/2015  1:16 PM  % Wound base Black 0% 11/03/2015 11:00 AM  % Wound base Other (Comment) 0% 11/07/2015  1:16 PM  Peri-wound Assessment Intact 11/07/2015  1:16 PM  Wound Length (cm) 5.5 cm 11/03/2015 11:00 AM  Wound Width (cm) 2.7 cm 11/03/2015 11:00 AM  Wound Depth (cm) 0.5 cm 11/03/2015 11:00 AM  Undermining (cm) [place] at 3:00 to a depth of 1.2 11/03/2015 11:00 AM  Margins Unattached edges (unapproximated) 11/07/2015  1:16 PM  Closure None 11/07/2015  1:16 PM  Drainage Amount Minimal 11/07/2015  1:16 PM  Drainage Description Sanguineous 11/07/2015  1:16 PM  Non-staged Wound Description Not applicable 3/95/3202  3:34 PM  Treatment Hydrotherapy (Pulse lavage);Packing (Saline gauze) 11/07/2015  1:16 PM     Wound / Incision (Open or Dehisced) 10/25/15 Incision - Open Hand Right Dorsal aspect - Radial side (Active)  Dressing Type Compression wrap;Gauze (Comment);Moist to dry;Moisture barrier 11/07/2015  1:16 PM  Dressing Changed Changed 11/07/2015  1:16 PM  Dressing Status  Clean;Dry;Intact 11/07/2015  1:16 PM  Dressing Change Frequency Daily 11/07/2015  1:16 PM  Site / Wound Assessment Red;Bleeding 11/07/2015  1:16 PM  % Wound base Red or Granulating 100% 11/07/2015  1:16 PM  % Wound base Yellow 0% 11/07/2015  1:16 PM  % Wound base Black 0% 11/07/2015  1:16 PM  % Wound base Other (Comment) 0% 11/07/2015  1:16 PM  Peri-wound Assessment Intact 11/07/2015  1:16 PM  Wound Length (cm) 4.3 cm 11/03/2015 11:00 AM  Wound Width (cm) 1.5 cm 11/03/2015 11:00 AM  Wound Depth (cm) 0.5 cm 11/03/2015 11:00 AM  Undermining (cm) Undermining at 9:00 to a depth of 1.0.   11/03/2015 11:00 AM  Margins Unattached edges (unapproximated) 11/07/2015  1:16 PM  Closure None 11/07/2015  1:16 PM  Drainage Amount Minimal 11/07/2015  1:16 PM  Drainage Description Sanguineous 11/07/2015  1:16 PM  Non-staged Wound Description Not applicable 3/56/8616  8:37 PM  Treatment Hydrotherapy (Pulse lavage);Packing (Saline gauze) 11/07/2015  1:16 PM     Wound / Incision (Open or Dehisced) 10/25/15 Incision - Open Hand Left Dorsal aspect (Active)  Dressing Type Compression wrap;Moist to dry;Gauze (Comment) 11/07/2015  1:16 PM  Dressing Changed Changed 11/07/2015  1:16 PM  Dressing Status Clean;Dry;Intact 11/07/2015  1:16 PM  Dressing Change Frequency Daily 11/07/2015  1:16 PM  Site / Wound Assessment Pink;Yellow 11/07/2015  1:16 PM  % Wound base Red or Granulating 85% 11/07/2015  1:16 PM  % Wound base Yellow 15% 11/07/2015  1:16 PM  % Wound base Black 0% 11/07/2015  1:16 PM  % Wound base Other (Comment) 0% 11/07/2015  1:16 PM  Peri-wound Assessment Intact 11/07/2015  1:16 PM  Wound Length (cm) 3.3 cm 11/03/2015 11:00 AM  Wound Width (cm) 1.5 cm 11/03/2015 11:00 AM  Wound Depth (cm) 1.5 cm 11/03/2015 11:00 AM  Undermining (cm) Undermining at 4:00 1.0cm and at 9:00 2.0cm. 11/03/2015 11:00 AM  Margins Unattached edges (unapproximated) 11/07/2015  1:16 PM  Closure None 11/07/2015  1:16 PM  Drainage Amount Minimal 11/07/2015  1:16 PM   Drainage Description Serosanguineous;Purulent 11/07/2015  1:16 PM  Non-staged Wound Description Not applicable 11/10/9676  9:38 PM  Treatment Hydrotherapy (Pulse lavage);Packing (Impregnated strip) 11/07/2015  1:16 PM     Wound / Incision (Open or Dehisced) 10/25/15 Incision - Open Hand Left Palmar aspect (Active)  Dressing Type Compression wrap;Gauze (Comment);Moist to dry 11/07/2015  1:16 PM  Dressing Changed Changed 11/07/2015  1:16 PM  Dressing Status Clean;Dry;Intact 11/07/2015  1:16 PM  Dressing Change Frequency Daily 11/07/2015  1:16 PM  Site / Wound Assessment Pink;Yellow 11/07/2015  1:16 PM  % Wound base Red or Granulating 95% 11/07/2015  1:16 PM  % Wound base Yellow 5% 11/07/2015  1:16 PM  % Wound base Black 0% 11/07/2015  1:16 PM  % Wound base Other (Comment) 0% 11/07/2015  1:16 PM  Peri-wound Assessment Intact;Maceration 11/07/2015  1:16 PM  Wound Length (cm) 0.4 cm 11/03/2015 11:00 AM  Wound Width (cm) 3.5 cm 11/03/2015 11:00 AM  Wound Depth (cm) 1.8 cm 11/03/2015 11:00 AM  Undermining (cm) Undermining 4:00 - 7:00 2.0cm. 11/03/2015 11:00 AM  Margins Unattached edges (unapproximated) 11/07/2015  1:16 PM  Closure None 11/07/2015  1:16 PM  Drainage Amount Minimal 11/07/2015  1:16 PM  Drainage Description Serosanguineous;Purulent 11/07/2015  1:16 PM  Non-staged Wound Description Not applicable 04/28/7508  2:58 PM  Treatment Hydrotherapy (Pulse lavage);Packing (Impregnated strip) 11/07/2015  1:16 PM   Hydrotherapy Pulsed lavage therapy - wound location: Bilateral hands Pulsed Lavage with Suction (psi): 12 psi Pulsed Lavage with Suction - Normal Saline Used: 1000 mL (Between all wounds) Pulsed Lavage Tip: Tunneling tip   Wound Assessment and Plan  Wound Therapy - Assess/Plan/Recommendations Wound Therapy - Clinical Statement: Pt with increased sanguanous drainage today and noted yellow purulance from dorsum and palm of L hand.  Awaiting call back from Dr. Fredna Dow to update hydrotherapy frequency. R  hand is appropriate for 3x/week as it remains 100% granulation.  Wound Therapy - Functional Problem List: Decreased strength/AROM of hands/fingers for ADL's and fine motor tasks.  Factors Delaying/Impairing Wound Healing: Substance abuse;Multiple medical problems Hydrotherapy Plan: Debridement;Dressing change;Patient/family education;Pulsatile lavage with suction Wound Therapy - Frequency: 6X / week Wound Therapy - Follow Up Recommendations: Other (comment) (LTAC vs SNF) Wound Plan: See above  Wound Therapy Goals- Improve the function of patient's integumentary system by progressing the wound(s) through the phases of wound healing (inflammation - proliferation - remodeling) by: Decrease Necrotic Tissue to: 0 Decrease Necrotic Tissue - Progress: Progressing toward goal Increase Granulation Tissue to: 100 Increase Granulation Tissue - Progress: Progressing toward goal Patient/Family will be able to : complete dressing changes independently prior to d/c Patient/Family Instruction Goal - Progress: Not progressing Goals/treatment plan/discharge plan were made with and agreed upon by patient/family: No, Patient unable to participate in goals/treatment/discharge plan and family unavailable Time For Goal Achievement: 7 days Wound Therapy - Potential for Goals: Good  Goals will be updated until maximal potential achieved or discharge criteria met.  Discharge criteria: when goals achieved, discharge  from hospital, MD decision/surgical intervention, no progress towards goals, refusal/missing three consecutive treatments without notification or medical reason.  GP     Rolinda Roan 11/07/2015, 1:23 PM   Rolinda Roan, PT, DPT Acute Rehabilitation Services Pager: [phone]

## 2015-11-07 NOTE — Progress Notes (Signed)
CSW continues to follow for substance abuse assessment/resources and SNF placement once medically appropriate.      Lance MussAshley Gardner,MSW, LCSW [facility] ED/67M Clinical Social Worker [phone]

## 2015-11-07 NOTE — Progress Notes (Signed)
eLink Physician-Brief Progress Note Patient Name: Kevin Austin DOB: 1990/06/05 MRN: [id number]   Date of Service  11/07/2015  HPI/Events of Note  Bedside nurse requesting clarification on sedation and pain medications. Currently on Versed and fentanyl infusions. Also on Seroquel, Valium, and methadone via tube.   eICU Interventions  1. Continuing current medications 2. Adjusting fentanyl bolus to via infusion      Intervention Category Intermediate Interventions: Pain - evaluation and management  Lawanda CousinsJennings Mariellen Blaney 11/07/2015, 11:38 PM

## 2015-11-07 NOTE — Progress Notes (Signed)
   Assessment / Plan: 1 Day Post-Op  S/P Procedure(s) (LRB): IRRIGATION AND DEBRIDEMENT HIP (Right) IRRIGATION AND DEBRIDEMENT FOOT (Left) and Right Left Foot on 10/24/15 and Right Ankle 11/01/15 by Dr. Marcial Pacasimothy D. Eulah PontMurphy   Principal Problem:   MRSA bacteremia Active Problems:   Acute respiratory failure (HCC)   Abscess   Altered mental status   Encounter for central line placement   Bilateral pneumonia   Sternal osteomyelitis (HCC)   HCAP (healthcare-associated pneumonia)   Acute pulmonary edema (HCC)  Left Foot / Right Ankle wounds looking good.  No purulence.  Mild swelling laterally on the right.  Sutures in place on the right ankle.  Plan to d/c sutures right ankle ~11/15/15. Dorsiflexion/Plantarflexion/EHL/FHL intact.  Distal sensation intact.    Right hip hemovac w/ ~16300ml bloody output / NO purulence - likely d/c in 2-3 days.  Aquacel dressing C/D/I.  Plan to d/c sutures ~7/26.   Weight Bearing: Weight Bearing as Tolerated (WBAT) Left leg.  NWB right leg until hemovac d/c'd Dressings: B/L Feet/Ankles Gauze / Kerlix / Ace wrap Daily. VTE prophylaxis: per primary. Dispo: per primary  Subjective: Patient reports pain as mild b/l LE.  Nodding and gesturing with hands appropriately w/ questions.  Moves all extremities independently.  Objective:   VITALS:   Filed Vitals:   11/07/15 0400 11/07/15 0456 11/07/15 0500 11/07/15 0600  BP: 114/61  127/69 145/89  Pulse: 128  127 122  Temp:  102.3 F (39.1 C)    TempSrc:  Oral    Resp: 18  20 16   Height:      Weight:   77.5 kg (170 lb 13.7 oz)   SpO2: 98%  98% 99%    Kevin KernHenry Calvin Martensen Austin 11/07/2015, 7:45 AM
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
## 2015-11-07 NOTE — Progress Notes (Signed)
RT NOTE:  Pt remains on 28% ATC w/o complications. Vent available @ bedside. RT will monitor. VS: HR: 118, RR: 22, Sats: 100%

## 2015-11-07 NOTE — Progress Notes (Addendum)
[facility] for Infectious Disease    Date of Admission:  10/22/2015   Total days of antibiotics 17        Day 2 of ceftaroline, rifampin        Received 16 days of vancomycin   Principal Problem:   MRSA bacteremia Active Problems:   Acute respiratory failure (HCC)   Abscess   Altered mental status   Bilateral pneumonia   Sternal osteomyelitis (HCC)   Septic arthritis of hip ([place])   Encounter for central line placement   HCAP (healthcare-associated pneumonia)   Acute pulmonary edema ([place])   . sodium chloride   Intravenous Once  . antiseptic oral rinse  7 mL Mouth Rinse QID  . ceFTAROline (TEFLARO) IV  600 mg Intravenous Q8H  . chlorhexidine gluconate (SAGE KIT)  15 mL Mouth Rinse BID  . diazepam  2 mg Per Tube Q8H  . famotidine (PEPCID) IV  20 mg Intravenous Q12H  . furosemide  20 mg Intravenous Q12H  . methadone  20 mg Per Tube Q8H  . polyethylene glycol  17 g Oral Daily  . QUEtiapine  50 mg Per Tube Q12H  . rifampin  600 mg Oral Daily  . sennosides  5 mL Per Tube BID  . sodium chloride  1,000 mL Intravenous Once    SUBJECTIVE: Mr. Kevin Austin is sedated, breathing spontaneously on supplemental oxygen via trach collar.  Review of Systems: Review of Systems  Unable to perform ROS: medical condition    History reviewed. No pertinent past medical history.  Social History  Substance Use Topics  . Smoking status: None  . Smokeless tobacco: None  . Alcohol Use: None    History reviewed. No pertinent family history. Allergies  Allergen Reactions  . Ketorolac Nausea Only    Per [facility] records  . Tramadol Nausea Only    Per [facility] records    OBJECTIVE: Filed Vitals:   11/07/15 0900 11/07/15 1000 11/07/15 1100 11/07/15 1121  BP: 136/73 112/48 127/68   Pulse: 118 124 121   Temp:    101 F (38.3 C)  TempSrc:    Rectal  Resp: '16 21 21   ' Height:      Weight:      SpO2: 100% 99% 100%    Body mass index is 23.84 kg/(m^2).  Physical  Exam GENERAL- Heavily sedated during wound care HEENT- PERRL, normal conjuntivae CARDIAC- Tachycardic, regular rhythm, no murmurs RESP- Bilateral crackles ABDOMEN- Bowel sounds present throughout EXTREMITIES- hands and feet in ACE wraps, no pedal edema   Lab Results Lab Results  Component Value Date   WBC 15.0* 11/07/2015   HGB 7.5* 11/07/2015   HCT 22.7* 11/07/2015   MCV 83.5 11/07/2015   PLT 462* 11/07/2015    Lab Results  Component Value Date   CREATININE 0.61 11/07/2015   BUN 12 11/07/2015   NA 133* 11/07/2015   K 4.1 11/07/2015   CL 96* 11/07/2015   CO2 28 11/07/2015    Lab Results  Component Value Date   ALT 31 11/06/2015   AST 55* 11/06/2015   ALKPHOS 128* 11/06/2015   BILITOT 1.6* 11/06/2015     Microbiology: Recent Results (from the past 240 hour(s))  Aerobic/Anaerobic Culture (surgical/deep wound)     Status: None   Collection Time: 11/01/15  4:39 PM  Result Value Ref Range Status   Specimen Description ABSCESS  Final   Special Requests LEFT STERNUM PATIENT ON FOLLOWING [facility]  Final  Gram Stain   Final    ABUNDANT WBC PRESENT,BOTH PMN AND MONONUCLEAR FEW GRAM POSITIVE COCCI IN CLUSTERS    Culture   Final    FEW METHICILLIN RESISTANT STAPHYLOCOCCUS AUREUS NO ANAEROBES ISOLATED    Report Status 11/06/2015 FINAL  Final   Organism ID, Bacteria METHICILLIN RESISTANT STAPHYLOCOCCUS AUREUS  Final      Susceptibility   Methicillin resistant staphylococcus aureus - MIC*    CIPROFLOXACIN <=0.5 SENSITIVE Sensitive     ERYTHROMYCIN >=8 RESISTANT Resistant     GENTAMICIN <=0.5 SENSITIVE Sensitive     OXACILLIN >=4 RESISTANT Resistant     TETRACYCLINE <=1 SENSITIVE Sensitive     VANCOMYCIN <=0.5 SENSITIVE Sensitive     TRIMETH/SULFA <=10 SENSITIVE Sensitive     CLINDAMYCIN <=0.25 SENSITIVE Sensitive     RIFAMPIN <=0.5 SENSITIVE Sensitive     Inducible Clindamycin NEGATIVE Sensitive     * FEW METHICILLIN RESISTANT STAPHYLOCOCCUS AUREUS  Culture, blood  (routine x 2)     Status: None (Preliminary result)   Collection Time: 11/05/15  3:20 PM  Result Value Ref Range Status   Specimen Description BLOOD LEFT ANTECUBITAL  Final   Special Requests IN PEDIATRIC BOTTLE  3CC  Final   Culture  Setup Time   Final    GRAM POSITIVE COCCI IN CLUSTERS AEROBIC BOTTLE ONLY CRITICAL RESULT CALLED TO, READ BACK BY AND VERIFIED WITH: E MARTIN,PHARMD AT 1207 11/06/15 BY L BENFIELD    Culture GRAM POSITIVE COCCI  Final   Report Status PENDING  Incomplete  Culture, respiratory (NON-Expectorated)     Status: None (Preliminary result)   Collection Time: 11/05/15  3:29 PM  Result Value Ref Range Status   Specimen Description TRACHEAL ASPIRATE  Final   Special Requests NONE  Final   Gram Stain   Final    ABUNDANT WBC PRESENT,BOTH PMN AND MONONUCLEAR ABUNDANT GRAM POSITIVE COCCI IN PAIRS FEW GRAM NEGATIVE RODS    Culture ABUNDANT STAPHYLOCOCCUS AUREUS  Final   Report Status PENDING  Incomplete  Culture, blood (routine x 2)     Status: None (Preliminary result)   Collection Time: 11/05/15  3:30 PM  Result Value Ref Range Status   Specimen Description BLOOD LEFT ANTECUBITAL  Final   Special Requests IN PEDIATRIC BOTTLE  3CC  Final   Culture NO GROWTH < 24 HOURS  Final   Report Status PENDING  Incomplete  Urine culture     Status: None   Collection Time: 11/05/15  5:53 PM  Result Value Ref Range Status   Specimen Description URINE, CATHETERIZED  Final   Special Requests PATIENT ON FOLLOWING [facility]  Final   Culture NO GROWTH  Final   Report Status 11/06/2015 FINAL  Final  Aerobic/Anaerobic Culture (surgical/deep wound)     Status: None (Preliminary result)   Collection Time: 11/06/15  1:53 PM  Result Value Ref Range Status   Specimen Description TISSUE RIGHT HIP  Final   Special Requests PT ON ANCEF  Final   Gram Stain   Final    ABUNDANT WBC PRESENT,BOTH PMN AND MONONUCLEAR NO ORGANISMS SEEN    Culture PENDING  Incomplete   Report Status PENDING   Incomplete     ASSESSMENT: Disseminated MRSA bacteremia: Mr. Kevin Austin remains febrile and tachycardic today despite prolonged treatment on vancomycin and repeated I&D including the right hip yesterday. His sputum culture is growing abundant staph aureus suggesting this is ongoing fever from his same disseminated infection. Blood cultures are positive from 7/10 but  this may be a contaminant versus true persistent MRSA bacteremia given only 1 of 2 positive samples. He does also have worsening leukocytosis along with this fever and tachycardia. Due to his difficulty maintaining a therapeutic dose of vancomycin and ongoing infection his antibiotics were changed to ceftaroline and rifampin yesterday.   We will continue to follow to see if there is a good response on these antibiotics and follow up his repeat blood cultures.  PLAN: 1. Continue ceftaroline and rifampin   Collier Salina, MD PGY-II Internal Medicine Resident Pager# [phone] 11/07/2015, 11:27 AM   Addendum: Mr. Kevin Austin continues to be febrile due to his widely disseminated MRSA infection. It is too soon to expect much benefit from his new antibiotic regimen of self-care leaning and rifampin. His most recent blood culture grew coagulase-negative staph which is likely to be an insignificant contaminant.  Michel Bickers, MD [facility] [phone] pager   [phone] cell 11/07/2015, 3:05 PM
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
## 2015-11-08 ENCOUNTER — Inpatient Hospital Stay (HOSPITAL_COMMUNITY): Payer: Medicaid Other

## 2015-11-08 DIAGNOSIS — L02612 Cutaneous abscess of left foot: Secondary | ICD-10-CM | POA: Diagnosis present

## 2015-11-08 DIAGNOSIS — L02511 Cutaneous abscess of right hand: Secondary | ICD-10-CM | POA: Diagnosis present

## 2015-11-08 DIAGNOSIS — M4628 Osteomyelitis of vertebra, sacral and sacrococcygeal region: Secondary | ICD-10-CM | POA: Diagnosis present

## 2015-11-08 DIAGNOSIS — F1910 Other psychoactive substance abuse, uncomplicated: Secondary | ICD-10-CM | POA: Diagnosis present

## 2015-11-08 DIAGNOSIS — M009 Pyogenic arthritis, unspecified: Secondary | ICD-10-CM | POA: Diagnosis present

## 2015-11-08 LAB — CULTURE, BLOOD (ROUTINE X 2)

## 2015-11-08 LAB — BASIC METABOLIC PANEL
Anion gap: 7 (ref 5–15)
BUN: 9 mg/dL (ref 6–20)
CHLORIDE: 95 mmol/L — AB (ref 101–111)
CO2: 28 mmol/L (ref 22–32)
Calcium: 8.4 mg/dL — ABNORMAL LOW (ref 8.9–10.3)
Creatinine, Ser: 0.38 mg/dL — ABNORMAL LOW (ref 0.61–1.24)
GFR calc Af Amer: >60 mL/min (ref >60)
GFR calc non Af Amer: >60 mL/min (ref >60)
Glucose, Bld: 113 mg/dL — ABNORMAL HIGH (ref 65–99)
POTASSIUM: 3.8 mmol/L (ref 3.5–5.1)
SODIUM: 130 mmol/L — AB (ref 135–145)

## 2015-11-08 LAB — GLUCOSE, CAPILLARY
GLUCOSE-CAPILLARY: 105 mg/dL — AB (ref 65–99)
GLUCOSE-CAPILLARY: 108 mg/dL — AB (ref 65–99)
GLUCOSE-CAPILLARY: 112 mg/dL — AB (ref 65–99)
GLUCOSE-CAPILLARY: 114 mg/dL — AB (ref 65–99)
GLUCOSE-CAPILLARY: 134 mg/dL — AB (ref 65–99)
GLUCOSE-CAPILLARY: 99 mg/dL (ref 65–99)

## 2015-11-08 LAB — CBC
HEMATOCRIT: 23.9 % — AB (ref 39.0–52.0)
HEMOGLOBIN: 7.8 g/dL — AB (ref 13.0–17.0)
MCH: 27.2 pg (ref 26.0–34.0)
MCHC: 32.6 g/dL (ref 30.0–36.0)
MCV: 83.3 fL (ref 78.0–100.0)
Platelets: 501 10*3/uL — ABNORMAL HIGH (ref 150–400)
RBC: 2.87 MIL/uL — AB (ref 4.22–5.81)
RDW: 14.5 % (ref 11.5–15.5)
WBC: 11.9 10*3/uL — ABNORMAL HIGH (ref 4.0–10.5)

## 2015-11-08 MED ORDER — FUROSEMIDE 40 MG PO TABS
40.0000 mg | ORAL_TABLET | Freq: Every day | ORAL | Status: DC
  Administered 2015-11-08 – 2015-11-11 (×4): 40 mg
  Filled 2015-11-08 (×4): qty 1

## 2015-11-08 MED ORDER — MIDAZOLAM BOLUS VIA INFUSION
2.0000 mg | INTRAVENOUS | Status: DC | PRN
  Filled 2015-11-08: qty 2

## 2015-11-08 NOTE — Progress Notes (Signed)
   Assessment / Plan: 2 Days Post-Op  S/P Procedure(s) (LRB): IRRIGATION AND DEBRIDEMENT HIP (Right) 11/06/15 IRRIGATION AND DEBRIDEMENT FOOT (Left) and (Right) Left Foot on 10/24/15 and Right Ankle 11/01/15  by Dr. Marcial Pacasimothy D. Eulah PontMurphy   Principal Problem:   MRSA bacteremia Active Problems:   Acute respiratory failure (HCC)   Abscess   Altered mental status   Encounter for central line placement   Bilateral pneumonia   Sternal osteomyelitis (HCC)   HCAP (healthcare-associated pneumonia)   Acute pulmonary edema (HCC)   Septic arthritis of hip (HCC)  Left Foot / Right Ankle wounds Dry / looking good.  No purulence.  Sutures in place on the right ankle.  Plan to d/c sutures right ankle ~11/15/15. Dorsiflexion/Plantarflexion/EHL/FHL intact.  Distal sensation intact.    Right hip hemovac w/ decreasing Serosanguinous output / NO purulence - likely d/c hemovac 11/09/15.  Aquacel dressing w/ mild SS Drainage.  Plan to d/c Right Hip sutures ~7/25.  Weight Bearing: Weight Bearing as Tolerated (WBAT) Left leg.  NWB right leg until hemovac d/c'd Dressings:   Right Ankle Gauze / Kerlix / Ace wrap Daily.  Left foot dry dressing daily.  Right Hip Mepilex PRN and Hemovac until 11/09/15. VTE prophylaxis: per primary. Dispo: per primary  Subjective: Patient reports pain as decreased b/l LE.  Mild pain Right Hip. Nodding and gesturing with hands appropriately w/ questions.  Moves all extremities independently.  Objective:   VITALS:   Filed Vitals:   11/08/15 0434 11/08/15 0445 11/08/15 0500 11/08/15 0800  BP:  142/95 133/86   Pulse:  110 108   Temp:    98.9 F (37.2 C)  TempSrc:    Rectal  Resp:  21 18   Height:      Weight: 78.1 kg (172 lb 2.9 oz)     SpO2:  97% 98%     Kevin Austin 11/08/2015, 8:37 AM
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
## 2015-11-08 NOTE — Progress Notes (Signed)
Patient seen for trach team follow up.  No education at this time.  All needed equipment at the bedside.  Will continue to follow.

## 2015-11-08 NOTE — Progress Notes (Signed)
[facility] for Infectious Disease    Date of Admission:  10/22/2015   Total days of antibiotics 18 Day 3 of ceftaroline, rifampin Received 16 days of vancomycin  Principal Problem:   MRSA bacteremia Active Problems:   Acute respiratory failure (HCC)   Altered mental status   MRSA pneumonia ([place])   IV drug abuse   Abscess of left hand   Sternal osteomyelitis (HCC)   Septic arthritis of hip (HCC)   Septic arthritis of right ankle (HCC)   Sacral osteomyelitis (HCC)   Abscess of right hand   Abscess of left foot   Encounter for central line placement   Acute pulmonary edema ([place])   . sodium chloride   Intravenous Once  . antiseptic oral rinse  7 mL Mouth Rinse QID  . ceFTAROline (TEFLARO) IV  600 mg Intravenous Q8H  . chlorhexidine gluconate (SAGE KIT)  15 mL Mouth Rinse BID  . diazepam  2 mg Per Tube Q8H  . furosemide  40 mg Per Tube Daily  . methadone  20 mg Per Tube Q8H  . pantoprazole sodium  40 mg Per Tube Q24H  . polyethylene glycol  17 g Oral Daily  . QUEtiapine  50 mg Per Tube Q12H  . rifampin  600 mg Oral Daily  . sennosides  5 mL Per Tube BID  . sodium chloride  1,000 mL Intravenous Once    SUBJECTIVE: Kevin Austin is seen today off ventilator and wakes easily to voice and nods in answer to some questions and follows commands. No specific events were reported.  Review of Systems: Review of Systems  Unable to perform ROS: patient nonverbal    History reviewed. No pertinent past medical history.  Social History  Substance Use Topics  . Smoking status: None  . Smokeless tobacco: None  . Alcohol Use: None    History reviewed. No pertinent family history. Allergies  Allergen Reactions  . Ketorolac Nausea Only    Per [facility] records  . Tramadol Nausea Only    Per [facility]  records    OBJECTIVE: Filed Vitals:   11/08/15 0800 11/08/15 0839 11/08/15 0900 11/08/15 1000  BP: 147/96 147/96 143/79 142/84  Pulse: 113 108 115 111  Temp: 98.9 F (37.2 C)     TempSrc: Rectal     Resp: '15 15 17 19  ' Height:      Weight:      SpO2: 100% 99% 100% 99%   Body mass index is 24.02 kg/(m^2).  Physical Exam GENERAL- Wakes to voice, following all commands and nodding head to questions HEENT- Tracheostomy tube CARDIAC- Tachycardic with a regular rhythm, no murmurs RESP- Bilateral crackles, off ventilator support ABDOMEN- Soft, no guarding or rebound EXTREMITIES- Right foot no longer in compressive dressing, hands and left foot in ACE wraps no pedal edema. SKIN- Decreased folliculitis   Lab Results Lab Results  Component Value Date   WBC 11.9* 11/08/2015   HGB 7.8* 11/08/2015   HCT 23.9* 11/08/2015   MCV 83.3 11/08/2015   PLT 501* 11/08/2015    Lab Results  Component Value Date   CREATININE 0.38* 11/08/2015   BUN 9 11/08/2015   NA 130* 11/08/2015   K 3.8 11/08/2015   CL 95* 11/08/2015   CO2 28 11/08/2015    Lab Results  Component Value Date   ALT 31 11/06/2015   AST 55* 11/06/2015   ALKPHOS 128* 11/06/2015   BILITOT 1.6* 11/06/2015  Microbiology: Recent Results (from the past 240 hour(s))  Aerobic/Anaerobic Culture (surgical/deep wound)     Status: None   Collection Time: 11/01/15  4:39 PM  Result Value Ref Range Status   Specimen Description ABSCESS  Final   Special Requests LEFT STERNUM PATIENT ON FOLLOWING VANC  Final   Gram Stain   Final    ABUNDANT WBC PRESENT,BOTH PMN AND MONONUCLEAR FEW GRAM POSITIVE COCCI IN CLUSTERS    Culture   Final    FEW METHICILLIN RESISTANT STAPHYLOCOCCUS AUREUS NO ANAEROBES ISOLATED    Report Status 11/06/2015 FINAL  Final   Organism ID, Bacteria METHICILLIN RESISTANT STAPHYLOCOCCUS AUREUS  Final      Susceptibility   Methicillin resistant staphylococcus aureus - MIC*    CIPROFLOXACIN <=0.5 SENSITIVE  Sensitive     ERYTHROMYCIN >=8 RESISTANT Resistant     GENTAMICIN <=0.5 SENSITIVE Sensitive     OXACILLIN >=4 RESISTANT Resistant     TETRACYCLINE <=1 SENSITIVE Sensitive     VANCOMYCIN <=0.5 SENSITIVE Sensitive     TRIMETH/SULFA <=10 SENSITIVE Sensitive     CLINDAMYCIN <=0.25 SENSITIVE Sensitive     RIFAMPIN <=0.5 SENSITIVE Sensitive     Inducible Clindamycin NEGATIVE Sensitive     * FEW METHICILLIN RESISTANT STAPHYLOCOCCUS AUREUS  Culture, blood (routine x 2)     Status: Abnormal   Collection Time: 11/05/15  3:20 PM  Result Value Ref Range Status   Specimen Description BLOOD LEFT ANTECUBITAL  Final   Special Requests IN PEDIATRIC BOTTLE  3CC  Final   Culture  Setup Time   Final    GRAM POSITIVE COCCI IN CLUSTERS AEROBIC BOTTLE ONLY CRITICAL RESULT CALLED TO, READ BACK BY AND VERIFIED WITH: E MARTIN,PHARMD AT 1207 11/06/15 BY L BENFIELD    Culture (A)  Final    STAPHYLOCOCCUS SPECIES (COAGULASE NEGATIVE) THE SIGNIFICANCE OF ISOLATING THIS ORGANISM FROM A SINGLE SET OF BLOOD CULTURES WHEN MULTIPLE SETS ARE DRAWN IS UNCERTAIN. PLEASE NOTIFY THE MICROBIOLOGY DEPARTMENT WITHIN ONE WEEK IF SPECIATION AND SENSITIVITIES ARE REQUIRED.    Report Status 11/08/2015 FINAL  Final  Culture, respiratory (NON-Expectorated)     Status: None   Collection Time: 11/05/15  3:29 PM  Result Value Ref Range Status   Specimen Description TRACHEAL ASPIRATE  Final   Special Requests NONE  Final   Gram Stain   Final    ABUNDANT WBC PRESENT,BOTH PMN AND MONONUCLEAR ABUNDANT GRAM POSITIVE COCCI IN PAIRS FEW GRAM NEGATIVE RODS    Culture   Final    ABUNDANT METHICILLIN RESISTANT STAPHYLOCOCCUS AUREUS   Report Status 11/07/2015 FINAL  Final   Organism ID, Bacteria METHICILLIN RESISTANT STAPHYLOCOCCUS AUREUS  Final      Susceptibility   Methicillin resistant staphylococcus aureus - MIC*    CIPROFLOXACIN <=0.5 SENSITIVE Sensitive     ERYTHROMYCIN >=8 RESISTANT Resistant     GENTAMICIN <=0.5 SENSITIVE  Sensitive     OXACILLIN >=4 RESISTANT Resistant     TETRACYCLINE <=1 SENSITIVE Sensitive     VANCOMYCIN 1 SENSITIVE Sensitive     TRIMETH/SULFA <=10 SENSITIVE Sensitive     CLINDAMYCIN <=0.25 SENSITIVE Sensitive     RIFAMPIN <=0.5 SENSITIVE Sensitive     Inducible Clindamycin NEGATIVE Sensitive     * ABUNDANT METHICILLIN RESISTANT STAPHYLOCOCCUS AUREUS  Culture, blood (routine x 2)     Status: None (Preliminary result)   Collection Time: 11/05/15  3:30 PM  Result Value Ref Range Status   Specimen Description BLOOD LEFT ANTECUBITAL  Final   Special  Requests IN PEDIATRIC BOTTLE  3CC  Final   Culture NO GROWTH 2 DAYS  Final   Report Status PENDING  Incomplete  Urine culture     Status: None   Collection Time: 11/05/15  5:53 PM  Result Value Ref Range Status   Specimen Description URINE, CATHETERIZED  Final   Special Requests PATIENT ON FOLLOWING VANC  Final   Culture NO GROWTH  Final   Report Status 11/06/2015 FINAL  Final  Aerobic/Anaerobic Culture (surgical/deep wound)     Status: None (Preliminary result)   Collection Time: 11/06/15  1:53 PM  Result Value Ref Range Status   Specimen Description TISSUE RIGHT HIP  Final   Special Requests PT ON ANCEF  Final   Gram Stain   Final    ABUNDANT WBC PRESENT,BOTH PMN AND MONONUCLEAR NO ORGANISMS SEEN    Culture NO GROWTH < 24 HOURS  Final   Report Status PENDING  Incomplete     ASSESSMENT: Disseminated MRSA bacteremia: Mr. Nace is clinically improving over the past 48 hours along with decrease in fever, tachycardia, and leukocytosis all suggestive of a good response to his treatment on ceftaroline and rifampin. Repeated treatment with surgical drainage of large abscesses and hydrotherapy are contributing to this improvement. His most recent blood cultures from 7/10 are 1/2 positive for coagulase negative staphylococcus that is probably insignificant. At 72 hours with no findings on BCID these cultures are effectively complete so changing  to a PICC line and discontinuing his LIJ catheter would be ideal. Due to his history of IVDU safety of continuing this PICC will need to be addressed before disposition.  PLAN: 1. Continue ceftaroline and rifampin 2. Recommend changing central venous access to PICC line at least for hospital course  Collier Salina, MD PGY-II Internal Medicine Resident Pager# [phone] 11/08/2015, 11:31 AM

## 2015-11-08 NOTE — Evaluation (Signed)
Physical Therapy Evaluation Patient Details Name: Kevin Austin MRN: [id number] DOB: [birth year] Today's Date: 11/08/2015   History of Present Illness  Pt adm with disseminated MRSA bacteremia. Pt with repeated treatment with surgical drainage of large abscesses including bil hands, bil ankles, sternum, and rt hip. Pt intubated 6/26. Self extubated and then reintubated. Pt now with trach and on trach collar. PMH - IV drug abuse,   Clinical Impression  Pt admitted with above diagnosis and presents to PT with functional limitations due to deficits listed below (See PT problem list). Pt needs skilled PT to maximize independence and safety to allow discharge to SNF. Expect pt will make steady progress with mobility. Currently pt NWB on RLE which will limit amb.     Follow Up Recommendations SNF    Equipment Recommendations  Other (comment) (To be assessed)    Recommendations for Other Services OT consult     Precautions / Restrictions Precautions Precautions: Fall Restrictions Weight Bearing Restrictions: Yes RLE Weight Bearing: Non weight bearing (until hemovac out per ortho MD note 7/13.) LLE Weight Bearing: Weight bearing as tolerated      Mobility  Bed Mobility Overal bed mobility: Needs Assistance Bed Mobility: Supine to Sit;Sit to Supine     Supine to sit: +2 for physical assistance;Mod assist Sit to supine: +2 for physical assistance;Mod assist   General bed mobility comments: Assist to move legs off bed and to elevate trunk into sitting. Assist to lower trunk and bring legs up into bed.   Transfers                    Ambulation/Gait                Stairs            Wheelchair Mobility    Modified Rankin (Stroke Patients Only)       Balance Overall balance assessment: Needs assistance Sitting-balance support: Bilateral upper extremity supported;Feet supported Sitting balance-Leahy Scale: Poor Sitting balance - Comments: Sat EOB x 10 minutes  with min guard to min A with UE support. VSS                                     Pertinent Vitals/Pain Pain Assessment: Faces Faces Pain Scale: Hurts little more Pain Location: rt ankle and hip Pain Descriptors / Indicators: Guarding Pain Intervention(s): Limited activity within patient's tolerance;Monitored during session    Home Living Family/patient expects to be discharged to:: Skilled nursing facility                      Prior Function Level of Independence: Independent               Hand Dominance        Extremity/Trunk Assessment   Upper Extremity Assessment: Defer to OT evaluation           Lower Extremity Assessment: Generalized weakness         Communication   Communication: Tracheostomy  Cognition Arousal/Alertness: Awake/alert (When stimulated) Behavior During Therapy: WFL for tasks assessed/performed Overall Cognitive Status: Difficult to assess Area of Impairment: Following commands       Following Commands: Follows multi-step commands consistently            General Comments      Exercises        Assessment/Plan    PT Assessment Patient  needs continued PT services  PT Diagnosis Difficulty walking;Generalized weakness   PT Problem List Decreased strength;Decreased activity tolerance;Decreased balance;Decreased mobility  PT Treatment Interventions DME instruction;Gait training;Functional mobility training;Therapeutic activities;Therapeutic exercise;Balance training;Patient/family education   PT Goals (Current goals can be found in the Care Plan section) Acute Rehab PT Goals Patient Stated Goal: Pt unable to state PT Goal Formulation: With patient Time For Goal Achievement: 11/22/15 Potential to Achieve Goals: Good    Frequency Min 3X/week   Barriers to discharge        Co-evaluation               End of Session Equipment Utilized During Treatment: Oxygen Activity Tolerance: Patient  tolerated treatment well Patient left: in bed;with call bell/phone within reach;with restraints reapplied Nurse Communication: Mobility status         Time: [id number] PT Time Calculation (min) (ACUTE ONLY): 16 min   Charges:   PT Evaluation $PT Eval Moderate Complexity: 1 Procedure     PT G Codes:        Kevin Austin 11/08/2015, 1:26 PM [name] PT [phone]

## 2015-11-08 NOTE — Progress Notes (Signed)
Physical Therapy Wound Treatment Patient Details  Name: Kevin Austin MRN: [id number] Date of Birth: 06-28-1990  Today's Date: 11/08/2015 Time: 1100-1145 Time Calculation (min): 45 min  Subjective  Subjective: Pt awake and alert during session. Does not endorse pain.  Patient and Family Stated Goals: None stated Date of Onset:  (Unknown) Prior Treatments: I&D 6/27  Pain Score:  Pt does not indicate pain throughout session.   Wound Assessment  Wound / Incision (Open or Dehisced) 10/25/15 Incision - Open Hand Right Dorsal aspect - Ulnar side (Active)  Dressing Type Compression wrap;Moist to dry;Gauze (Comment) 11/08/2015 11:58 AM  Dressing Changed Changed 11/08/2015 11:58 AM  Dressing Status Clean;Dry;Intact 11/08/2015 11:58 AM  Dressing Change Frequency Daily 11/08/2015 11:58 AM  Site / Wound Assessment Red;Bleeding 11/08/2015 11:58 AM  % Wound base Red or Granulating 100% 11/08/2015 11:58 AM  % Wound base Yellow 0% 11/08/2015 11:58 AM  % Wound base Black 0% 11/08/2015 11:58 AM  % Wound base Other (Comment) 0% 11/08/2015 11:58 AM  Peri-wound Assessment Intact 11/08/2015 11:58 AM  Wound Length (cm) 5.5 cm 11/03/2015 11:00 AM  Wound Width (cm) 2.7 cm 11/03/2015 11:00 AM  Wound Depth (cm) 0.5 cm 11/03/2015 11:00 AM  Undermining (cm) Undermines at 3:00 to a depth of 1.2 11/03/2015 11:00 AM  Margins Unattached edges (unapproximated) 11/08/2015 11:58 AM  Closure None 11/08/2015 11:58 AM  Drainage Amount Minimal 11/08/2015 11:58 AM  Drainage Description Sanguineous 11/08/2015 11:58 AM  Non-staged Wound Description Not applicable 6/50/3546 56:81 AM  Treatment Hydrotherapy (Pulse lavage);Packing (Saline gauze) 11/08/2015 11:58 AM     Wound / Incision (Open or Dehisced) 10/25/15 Incision - Open Hand Right Dorsal aspect - Radial side (Active)  Dressing Type Compression wrap;Gauze (Comment);Moist to dry;Moisture barrier 11/08/2015 11:58 AM  Dressing Changed Changed 11/08/2015 11:58 AM  Dressing Status Clean;Dry;Intact  11/08/2015 11:58 AM  Dressing Change Frequency Daily 11/08/2015 11:58 AM  Site / Wound Assessment Red;Bleeding 11/08/2015 11:58 AM  % Wound base Red or Granulating 100% 11/08/2015 11:58 AM  % Wound base Yellow 0% 11/08/2015 11:58 AM  % Wound base Black 0% 11/08/2015 11:58 AM  % Wound base Other (Comment) 0% 11/08/2015 11:58 AM  Peri-wound Assessment Intact 11/08/2015 11:58 AM  Wound Length (cm) 4.3 cm 11/03/2015 11:00 AM  Wound Width (cm) 1.5 cm 11/03/2015 11:00 AM  Wound Depth (cm) 0.5 cm 11/03/2015 11:00 AM  Undermining (cm) Undermining at 9:00 to a depth of 1.0.   11/03/2015 11:00 AM  Margins Unattached edges (unapproximated) 11/08/2015 11:58 AM  Closure None 11/08/2015 11:58 AM  Drainage Amount Minimal 11/08/2015 11:58 AM  Drainage Description Sanguineous 11/08/2015 11:58 AM  Non-staged Wound Description Not applicable 2/75/1700 17:49 AM  Treatment Hydrotherapy (Pulse lavage);Packing (Saline gauze) 11/08/2015 11:58 AM     Wound / Incision (Open or Dehisced) 10/25/15 Incision - Open Hand Left Dorsal aspect (Active)  Dressing Type Compression wrap;Moist to dry;Gauze (Comment) 11/08/2015 11:58 AM  Dressing Changed Changed 11/08/2015 11:58 AM  Dressing Status Clean;Dry;Intact 11/08/2015 11:58 AM  Dressing Change Frequency Daily 11/08/2015 11:58 AM  Site / Wound Assessment Pink;Yellow 11/08/2015 11:58 AM  % Wound base Red or Granulating 80% 11/08/2015 11:58 AM  % Wound base Yellow 20% 11/08/2015 11:58 AM  % Wound base Black 0% 11/08/2015 11:58 AM  % Wound base Other (Comment) 0% 11/08/2015 11:58 AM  Peri-wound Assessment Intact 11/08/2015 11:58 AM  Wound Length (cm) 3.3 cm 11/03/2015 11:00 AM  Wound Width (cm) 1.5 cm 11/03/2015 11:00 AM  Wound Depth (cm) 1.5  cm 11/03/2015 11:00 AM  Undermining (cm) Undermining at 4:00 1.0cm and at 9:00 2.0cm. 11/03/2015 11:00 AM  Margins Unattached edges (unapproximated) 11/08/2015 11:58 AM  Closure None 11/08/2015 11:58 AM  Drainage Amount Minimal 11/08/2015 11:58 AM  Drainage  Description Serosanguineous;Purulent 11/08/2015 11:58 AM  Non-staged Wound Description Not applicable 05/18/6242 69:50 AM  Treatment Hydrotherapy (Pulse lavage);Packing (Impregnated strip) 11/08/2015 11:58 AM     Wound / Incision (Open or Dehisced) 10/25/15 Incision - Open Hand Left Palmar aspect (Active)  Dressing Type Compression wrap;Gauze (Comment);Moist to dry 11/08/2015 11:58 AM  Dressing Changed Changed 11/08/2015 11:58 AM  Dressing Status Clean;Dry;Intact 11/08/2015 11:58 AM  Dressing Change Frequency Daily 11/08/2015 11:58 AM  Site / Wound Assessment Pink;Yellow 11/08/2015 11:58 AM  % Wound base Red or Granulating 95% 11/08/2015 11:58 AM  % Wound base Yellow 5% 11/08/2015 11:58 AM  % Wound base Black 0% 11/08/2015 11:58 AM  % Wound base Other (Comment) 0% 11/08/2015 11:58 AM  Peri-wound Assessment Intact;Maceration 11/08/2015 11:58 AM  Wound Length (cm) 0.4 cm 11/03/2015 11:00 AM  Wound Width (cm) 3.5 cm 11/03/2015 11:00 AM  Wound Depth (cm) 1.8 cm 11/03/2015 11:00 AM  Undermining (cm) Undermining 4:00 - 7:00 2.0cm. 11/03/2015 11:00 AM  Margins Unattached edges (unapproximated) 11/08/2015 11:58 AM  Closure None 11/08/2015 11:58 AM  Drainage Amount Minimal 11/08/2015 11:58 AM  Drainage Description Serosanguineous;Purulent 11/08/2015 11:58 AM  Non-staged Wound Description Not applicable 11/16/5748 51:83 AM  Treatment Hydrotherapy (Pulse lavage);Packing (Impregnated strip) 11/08/2015 11:58 AM   Hydrotherapy Pulsed lavage therapy - wound location: Bilateral hands Pulsed Lavage with Suction (psi): 8 psi (Pain control) Pulsed Lavage with Suction - Normal Saline Used: 1000 mL (Between all wounds) Pulsed Lavage Tip: Tip with splash shield   Wound Assessment and Plan  Wound Therapy - Assess/Plan/Recommendations Wound Therapy - Clinical Statement: Continued yellow purulent drainage on the L hand. R hand is appropriate for 3x/week as it remains 100% granulation.  Wound Therapy - Functional Problem List:  Decreased strength/AROM of hands/fingers for ADL's and fine motor tasks.  Factors Delaying/Impairing Wound Healing: Substance abuse;Multiple medical problems Hydrotherapy Plan: Debridement;Dressing change;Patient/family education;Pulsatile lavage with suction Wound Therapy - Frequency: 6X / week Wound Therapy - Follow Up Recommendations: Other (comment) (LTAC vs SNF) Wound Plan: See above  Wound Therapy Goals- Improve the function of patient's integumentary system by progressing the wound(s) through the phases of wound healing (inflammation - proliferation - remodeling) by: Decrease Necrotic Tissue to: 0 Decrease Necrotic Tissue - Progress: Progressing toward goal Increase Granulation Tissue to: 100 Increase Granulation Tissue - Progress: Progressing toward goal Patient/Family will be able to : complete dressing changes independently prior to d/c Patient/Family Instruction Goal - Progress: Progressing toward goal Goals/treatment plan/discharge plan were made with and agreed upon by patient/family: No, Patient unable to participate in goals/treatment/discharge plan and family unavailable Time For Goal Achievement: 7 days Wound Therapy - Potential for Goals: Good  Goals will be updated until maximal potential achieved or discharge criteria met.  Discharge criteria: when goals achieved, discharge from hospital, MD decision/surgical intervention, no progress towards goals, refusal/missing three consecutive treatments without notification or medical reason.  GP     Rolinda Roan 11/08/2015, 12:05 PM  Rolinda Roan, PT, DPT Acute Rehabilitation Services Pager: [phone]

## 2015-11-08 NOTE — Progress Notes (Signed)
[facility] DAILY ICU PROGRESS NOTE                   [address]            [place] [address]          [phone]   2 Days Post-Op Procedure(s) (LRB): IRRIGATION AND DEBRIDEMENT HIP (Right)  Total Length of Stay:  LOS: 17 days   Subjective: Lower grade fevers, leukocytosis improving  Objective: Vital signs in last 24 hours: Temp:  [97.5 F (36.4 C)-101 F (38.3 C)] 98.9 F (37.2 C) (07/13 0800) Pulse Rate:  [108-126] 108 (07/13 0500) Cardiac Rhythm:  [-] Sinus tachycardia (07/13 0400) Resp:  [13-27] 18 (07/13 0500) BP: (112-148)/(48-97) 133/86 mmHg (07/13 0500) SpO2:  [97 %-100 %] 98 % (07/13 0500) FiO2 (%):  [28 %] 28 % (07/13 0445) Weight:  [172 lb 2.9 oz (78.1 kg)] 172 lb 2.9 oz (78.1 kg) (07/13 0434)  Filed Weights   11/06/15 0500 11/07/15 0500 11/08/15 0434  Weight: 181 lb 3.5 oz (82.2 kg) 170 lb 13.7 oz (77.5 kg) 172 lb 2.9 oz (78.1 kg)    Weight change: 1 lb 5.2 oz (0.6 kg)   Hemodynamic parameters for last 24 hours:    Intake/Output from previous day: 07/12 0701 - 07/13 0700 In: 4207.5 [I.V.:1377.5; NG/GT:2020; IV Piggyback:800] Out: 9371 [Urine:3425; Stool:1]  Intake/Output this shift:    Current Meds: Scheduled Meds: . sodium chloride   Intravenous Once  . antiseptic oral rinse  7 mL Mouth Rinse QID  . ceFTAROline (TEFLARO) IV  600 mg Intravenous Q8H  . chlorhexidine gluconate (SAGE KIT)  15 mL Mouth Rinse BID  . diazepam  2 mg Per Tube Q8H  . furosemide  40 mg Per Tube Daily  . methadone  20 mg Per Tube Q8H  . pantoprazole sodium  40 mg Per Tube Q24H  . polyethylene glycol  17 g Oral Daily  . QUEtiapine  50 mg Per Tube Q12H  . rifampin  600 mg Oral Daily  . sennosides  5 mL Per Tube BID  . sodium chloride  1,000 mL Intravenous Once   Continuous Infusions: . sodium chloride 10 mL/hr at 11/08/15 0600  . feeding supplement (VITAL AF 1.2 CAL) 1,000 mL (11/08/15 0600)  . fentaNYL infusion INTRAVENOUS 400 mcg/hr (11/08/15 0600)  .  midazolam (VERSED) infusion 10 mg/hr (11/08/15 0600)   PRN Meds:.acetaminophen (TYLENOL) oral liquid 160 mg/5 mL, docusate, fentaNYL, haloperidol lactate, ibuprofen, metoprolol, midazolam  Wound: chest wound examined, at least 50 percent granulation tissue, some purulence on dressing, some mobility of the bone  Lab Results: CBC: Recent Labs  11/07/15 0541 11/08/15 0439  WBC 15.0* 11.9*  HGB 7.5* 7.8*  HCT 22.7* 23.9*  PLT 462* 501*   BMET:  Recent Labs  11/07/15 0541 11/08/15 0439  NA 133* 130*  K 4.1 3.8  CL 96* 95*  CO2 28 28  GLUCOSE 126* 113*  BUN 12 9  CREATININE 0.61 0.38*  CALCIUM 8.5* 8.4*    PT/INR: No results for input(s): LABPROT, INR in the last 72 hours. Radiology: No results found.   Assessment/Plan: S/P Procedure(s) (LRB): IRRIGATION AND DEBRIDEMENT HIP (Right)  1 stable , cont dressing changes, IV abx per ID. May require Pec flaps depending on course     Kevin Austin E 11/08/2015 8:38 AM

## 2015-11-08 NOTE — Progress Notes (Signed)
Late entry- Per Dr Craige CottaSood earlier today, leave in trach sutures until day 10.

## 2015-11-08 NOTE — Progress Notes (Signed)
Subjective: S/p I&D of bilateral hands.  Has been getting hydrotherapy with packing changes. Patient reports pain as moderate.    Objective: Vital signs in last 24 hours: Temp:  [97.5 F (36.4 C)-99.6 F (37.6 C)] 99.6 F (37.6 C) (07/13 1511) Pulse Rate:  [102-126] 102 (07/13 1800) Resp:  [11-24] 21 (07/13 1800) BP: (113-149)/(58-98) 131/72 mmHg (07/13 1800) SpO2:  [97 %-100 %] 100 % (07/13 1800) FiO2 (%):  [28 %] 28 % (07/13 1604) Weight:  [78.1 kg (172 lb 2.9 oz)] 78.1 kg (172 lb 2.9 oz) (07/13 0434)  Intake/Output from previous day: 07/12 0701 - 07/13 0700 In: 4347.5 [I.V.:1437.5; NG/GT:2100; IV Piggyback:800] Out: 3426 [Urine:3425; Stool:1] Intake/Output this shift:     Recent Labs  11/06/15 0430 11/07/15 0541 11/08/15 0439  HGB 7.5* 7.5* 7.8*    Recent Labs  11/07/15 0541 11/08/15 0439  WBC 15.0* 11.9*  RBC 2.72* 2.87*  HCT 22.7* 23.9*  PLT 462* 501*    Recent Labs  11/07/15 0541 11/08/15 0439  NA 133* 130*  K 4.1 3.8  CL 96* 95*  CO2 28 28  BUN 12 9  CREATININE 0.61 0.38*  GLUCOSE 126* 113*  CALCIUM 8.5* 8.4*   No results for input(s): LABPT, INR in the last 72 hours.  wounds examined.  all look good.  no erythema of wounds.  mild rubor in left palm.  non tender.  packing in place.  no swelling.  Assessment/Plan: S/p I&D bilateral hands.  Wounds look good.  Continue packing until no undermining of wound edges, then switch to wet to dry dressings.  May do hydrotherapy three times per week.  Cashae Weich R 11/08/2015, 7:14 PM

## 2015-11-09 LAB — BASIC METABOLIC PANEL
Anion gap: 10 (ref 5–15)
BUN: 11 mg/dL (ref 6–20)
CHLORIDE: 94 mmol/L — AB (ref 101–111)
CO2: 25 mmol/L (ref 22–32)
CREATININE: 0.46 mg/dL — AB (ref 0.61–1.24)
Calcium: 8.3 mg/dL — ABNORMAL LOW (ref 8.9–10.3)
GFR calc Af Amer: >60 mL/min (ref >60)
GFR calc non Af Amer: >60 mL/min (ref >60)
GLUCOSE: 113 mg/dL — AB (ref 65–99)
Potassium: 3.7 mmol/L (ref 3.5–5.1)
SODIUM: 129 mmol/L — AB (ref 135–145)

## 2015-11-09 LAB — CBC
HCT: 22.3 % — ABNORMAL LOW (ref 39.0–52.0)
HEMOGLOBIN: 7.4 g/dL — AB (ref 13.0–17.0)
MCH: 27.3 pg (ref 26.0–34.0)
MCHC: 33.2 g/dL (ref 30.0–36.0)
MCV: 82.3 fL (ref 78.0–100.0)
Platelets: 426 10*3/uL — ABNORMAL HIGH (ref 150–400)
RBC: 2.71 MIL/uL — ABNORMAL LOW (ref 4.22–5.81)
RDW: 14.5 % (ref 11.5–15.5)
WBC: 10 10*3/uL (ref 4.0–10.5)

## 2015-11-09 LAB — GLUCOSE, CAPILLARY
GLUCOSE-CAPILLARY: 111 mg/dL — AB (ref 65–99)
GLUCOSE-CAPILLARY: 117 mg/dL — AB (ref 65–99)
GLUCOSE-CAPILLARY: 120 mg/dL — AB (ref 65–99)
GLUCOSE-CAPILLARY: 123 mg/dL — AB (ref 65–99)
GLUCOSE-CAPILLARY: 129 mg/dL — AB (ref 65–99)
Glucose-Capillary: 120 mg/dL — ABNORMAL HIGH (ref 65–99)
Glucose-Capillary: 122 mg/dL — ABNORMAL HIGH (ref 65–99)

## 2015-11-09 MED ORDER — HYDROMORPHONE HCL 1 MG/ML IJ SOLN
2.0000 mg | INTRAMUSCULAR | Status: DC | PRN
  Administered 2015-11-09 – 2015-11-15 (×42): 2 mg via INTRAVENOUS
  Filled 2015-11-09 (×44): qty 2

## 2015-11-09 MED ORDER — SODIUM CHLORIDE 0.9% FLUSH
10.0000 mL | Freq: Two times a day (BID) | INTRAVENOUS | Status: DC
  Administered 2015-11-09 – 2015-12-13 (×24): 10 mL

## 2015-11-09 MED ORDER — SODIUM CHLORIDE 0.9 % IV SOLN
INTRAVENOUS | Status: DC | PRN
  Administered 2015-11-25: 15:00:00 via INTRAVENOUS
  Administered 2015-12-07: 10 mL/h via INTRAVENOUS
  Administered 2015-12-16: 23:00:00 via INTRAVENOUS

## 2015-11-09 MED ORDER — LORAZEPAM 2 MG/ML IJ SOLN
2.0000 mg | INTRAMUSCULAR | Status: DC | PRN
  Administered 2015-11-12 – 2015-11-16 (×7): 2 mg via INTRAVENOUS
  Filled 2015-11-09 (×7): qty 1

## 2015-11-09 MED ORDER — HYDROMORPHONE HCL 2 MG PO TABS
2.0000 mg | ORAL_TABLET | ORAL | Status: DC | PRN
  Administered 2015-11-09 – 2015-11-13 (×2): 2 mg
  Filled 2015-11-09 (×2): qty 1

## 2015-11-09 MED ORDER — SODIUM CHLORIDE 0.9% FLUSH
10.0000 mL | INTRAVENOUS | Status: DC | PRN
  Administered 2015-11-12: 20 mL
  Administered 2015-11-14: 10 mL
  Administered 2015-11-15: 20 mL
  Administered 2015-11-25 – 2015-12-06 (×5): 10 mL
  Administered 2015-12-07: 20 mL
  Administered 2015-12-12 – 2015-12-14 (×3): 10 mL
  Administered 2015-12-15: 30 mL
  Administered 2015-12-15 (×2): 20 mL
  Administered 2015-12-17: 10 mL
  Administered 2015-12-17: 30 mL
  Administered 2015-12-18: 10 mL
  Filled 2015-11-09 (×18): qty 40

## 2015-11-09 NOTE — Progress Notes (Signed)
200cc of fentanyl and 80cc of versed that was in the patients room was wasted in the sink. Witness by Burnard BuntingKatrice Keller RN
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
## 2015-11-09 NOTE — Progress Notes (Signed)
      [address]       [place] [id number]             [phone]      Awake and alert on TC  BP 134/84 mmHg  Pulse 109  Temp(Src) 98.8 F (37.1 C) (Oral)  Resp 17  Ht 5\' 11"  (1.803 m)  Wt 161 lb 9.6 oz (73.3 kg)  BMI 22.55 kg/m2  SpO2 97%   Intake/Output Summary (Last 24 hours) at 11/09/15 [id number] Last data filed at 11/09/15 0700  Gross per 24 hour  Intake 3531.25 ml  Output   3110 ml  Net 421.25 ml   Fevers down on ceftaroline and rifampin  Chest wound looks much better than a few days ago, majority of wound is granulating, no significant necrotic tissue  Viviann SpareSteven C. Dorris FetchHendrickson, MD [facility] [phone]

## 2015-11-09 NOTE — Progress Notes (Addendum)
   Assessment / Plan: 3 Days Post-Op  S/P Procedure(s) (LRB): IRRIGATION AND DEBRIDEMENT HIP (Right) 11/06/15 IRRIGATION AND DEBRIDEMENT FOOT (Left) and (Right) Left Foot on 10/24/15 and Right Ankle 11/01/15  by Dr. Marcial Pacasimothy D. Eulah PontMurphy   Principal Problem:   MRSA bacteremia Active Problems:   Acute respiratory failure (HCC)   Abscess of left hand   Altered mental status   Encounter for central line placement   MRSA pneumonia (HCC)   Sternal osteomyelitis (HCC)   Acute pulmonary edema (HCC)   Septic arthritis of hip (HCC)   IV drug abuse   Septic arthritis of right ankle (HCC)   Sacral osteomyelitis (HCC)   Abscess of right hand   Abscess of left foot  Significant Improvement Clinically over the past 2 days.  Left Foot / Right Ankle Wounds Dry / looking good.  No purulence.  Sutures in place on the right ankle.  Plan to d/c sutures right ankle ~11/15/15. Dorsiflexion/Plantarflexion/EHL/FHL intact.  Distal sensation intact.    Right hip hemovac w/ minimal Serosanguinous output d/c'd 7/14.  Aquacel dressing w/ SS Drainage.  Plan to d/c Right Hip sutures ~7/25.  Weight Bearing: Weight Bearing as Tolerated (WBAT) B/L LE.   Dressings:   Right Ankle dry dressings Daily.  Left foot wet to dry dressing daily.  Right Hip Aquacel PRN and gauze over drain site. VTE prophylaxis: per primary. Dispo: per primary  Subjective: In chair at bedside on arrival.  Patient reports pain as decreased b/l LE.  Mild pain Right Hip. Speaking, Nodding and gesturing with hands appropriately w/ questions.  Moves all extremities independently.  Objective:   VITALS:   Filed Vitals:   11/09/15 0500 11/09/15 0600 11/09/15 0700 11/09/15 0733  BP: 125/83 124/69 134/84   Pulse: 108 116 109   Temp:    98.8 F (37.1 C)  TempSrc:    Oral  Resp: 14 22 17    Height:      Weight: 73.3 kg (161 lb 9.6 oz)     SpO2: 100% 98% 97%     Kevin Austin 11/09/2015, 7:57 AM
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
## 2015-11-09 NOTE — Progress Notes (Signed)
Physical Therapy Wound Treatment Patient Details  Name: Kevin Austin MRN: [id number] Date of Birth: 07-Feb-1991  Today's Date: 11/09/2015 Time: 1120-1206 Time Calculation (min): 46 min  Subjective  Subjective: Pt awake and alert during session. Does not endorse pain.  Patient and Family Stated Goals: None stated Date of Onset:  (Unknown) Prior Treatments: I&D 6/27  Pain Score:  Pt does not report pain throughout session.   Wound Assessment  Wound / Incision (Open or Dehisced) 10/25/15 Incision - Open Hand Right Dorsal aspect - Ulnar side (Active)  Dressing Type Compression wrap;Moist to dry;Gauze (Comment) 11/09/2015 12:14 PM  Dressing Changed Changed 11/09/2015 12:14 PM  Dressing Status Clean;Dry;Intact 11/09/2015 12:14 PM  Dressing Change Frequency Daily 11/09/2015 12:14 PM  Site / Wound Assessment Red;Bleeding 11/09/2015 12:14 PM  % Wound base Red or Granulating 100% 11/09/2015 12:14 PM  % Wound base Yellow 0% 11/09/2015 12:14 PM  % Wound base Black 0% 11/09/2015 12:14 PM  % Wound base Other (Comment) 0% 11/09/2015 12:14 PM  Peri-wound Assessment Intact 11/09/2015 12:14 PM  Wound Length (cm) 5.5 cm 11/03/2015 11:00 AM  Wound Width (cm) 2.7 cm 11/03/2015 11:00 AM  Wound Depth (cm) 0.5 cm 11/03/2015 11:00 AM  Undermining (cm) [place]mines at 3:00 to a depth of 1.2 11/03/2015 11:00 AM  Margins Unattached edges (unapproximated) 11/09/2015 12:14 PM  Closure None 11/09/2015 12:14 PM  Drainage Amount Minimal 11/09/2015 12:14 PM  Drainage Description Sanguineous 11/09/2015 12:14 PM  Non-staged Wound Description Not applicable 2/62/0355 97:41 PM  Treatment Hydrotherapy (Pulse lavage);Packing (Saline gauze) 11/09/2015 12:14 PM     Wound / Incision (Open or Dehisced) 10/25/15 Incision - Open Hand Right Dorsal aspect - Radial side (Active)  Dressing Type Compression wrap;Gauze (Comment);Moist to dry;Moisture barrier 11/09/2015 12:14 PM  Dressing Changed Changed 11/09/2015 12:14 PM  Dressing Status Clean;Dry;Intact  11/09/2015 12:14 PM  Dressing Change Frequency Daily 11/09/2015 12:14 PM  Site / Wound Assessment Red;Bleeding 11/09/2015 12:14 PM  % Wound base Red or Granulating 100% 11/09/2015 12:14 PM  % Wound base Yellow 0% 11/09/2015 12:14 PM  % Wound base Black 0% 11/09/2015 12:14 PM  % Wound base Other (Comment) 0% 11/09/2015 12:14 PM  Peri-wound Assessment Intact 11/09/2015 12:14 PM  Wound Length (cm) 4.3 cm 11/03/2015 11:00 AM  Wound Width (cm) 1.5 cm 11/03/2015 11:00 AM  Wound Depth (cm) 0.5 cm 11/03/2015 11:00 AM  Undermining (cm) Undermining at 9:00 to a depth of 1.0.   11/03/2015 11:00 AM  Margins Unattached edges (unapproximated) 11/09/2015 12:14 PM  Closure None 11/09/2015 12:14 PM  Drainage Amount Minimal 11/09/2015 12:14 PM  Drainage Description Sanguineous 11/09/2015 12:14 PM  Non-staged Wound Description Not applicable 6/38/4536 46:80 PM  Treatment Hydrotherapy (Pulse lavage);Packing (Saline gauze) 11/09/2015 12:14 PM     Wound / Incision (Open or Dehisced) 10/25/15 Incision - Open Hand Left Dorsal aspect (Active)  Dressing Type Compression wrap;Moist to dry;Gauze (Comment) 11/09/2015 12:14 PM  Dressing Changed Changed 11/09/2015 12:14 PM  Dressing Status Clean;Dry;Intact 11/09/2015 12:14 PM  Dressing Change Frequency Daily 11/09/2015 12:14 PM  Site / Wound Assessment Pink;Yellow 11/09/2015 12:14 PM  % Wound base Red or Granulating 90% 11/09/2015 12:14 PM  % Wound base Yellow 10% 11/09/2015 12:14 PM  % Wound base Black 0% 11/09/2015 12:14 PM  % Wound base Other (Comment) 0% 11/09/2015 12:14 PM  Peri-wound Assessment Intact 11/09/2015 12:14 PM  Wound Length (cm) 3.3 cm 11/03/2015 11:00 AM  Wound Width (cm) 1.5 cm 11/03/2015 11:00 AM  Wound Depth (cm) 1.5  cm 11/03/2015 11:00 AM  Undermining (cm) Undermining at 4:00 1.0cm and at 9:00 2.0cm. 11/03/2015 11:00 AM  Margins Unattached edges (unapproximated) 11/09/2015 12:14 PM  Closure None 11/09/2015 12:14 PM  Drainage Amount Minimal 11/09/2015 12:14 PM  Drainage  Description Serosanguineous;Purulent 11/09/2015 12:14 PM  Non-staged Wound Description Not applicable 07/10/9456 59:29 PM  Treatment Hydrotherapy (Pulse lavage);Packing (Impregnated strip) 11/09/2015 12:14 PM     Wound / Incision (Open or Dehisced) 10/25/15 Incision - Open Hand Left Palmar aspect (Active)  Dressing Type Compression wrap;Gauze (Comment);Moist to dry 11/09/2015 12:14 PM  Dressing Changed Changed 11/09/2015 12:14 PM  Dressing Status Clean;Dry;Intact 11/09/2015 12:14 PM  Dressing Change Frequency Daily 11/09/2015 12:14 PM  Site / Wound Assessment Pink;Yellow 11/09/2015 12:14 PM  % Wound base Red or Granulating 95% 11/09/2015 12:14 PM  % Wound base Yellow 5% 11/09/2015 12:14 PM  % Wound base Black 0% 11/09/2015 12:14 PM  % Wound base Other (Comment) 0% 11/09/2015 12:14 PM  Peri-wound Assessment Intact;Maceration 11/09/2015 12:14 PM  Wound Length (cm) 0.4 cm 11/03/2015 11:00 AM  Wound Width (cm) 3.5 cm 11/03/2015 11:00 AM  Wound Depth (cm) 1.8 cm 11/03/2015 11:00 AM  Undermining (cm) Undermining 4:00 - 7:00 2.0cm. 11/03/2015 11:00 AM  Margins Unattached edges (unapproximated) 11/09/2015 12:14 PM  Closure None 11/09/2015 12:14 PM  Drainage Amount Minimal 11/09/2015 12:14 PM  Drainage Description Serosanguineous;Purulent 11/09/2015 12:14 PM  Non-staged Wound Description Not applicable 2/44/6286 38:17 PM  Treatment Hydrotherapy (Pulse lavage);Packing (Impregnated strip) 11/09/2015 12:14 PM   Hydrotherapy Pulsed lavage therapy - wound location: Bilateral hands Pulsed Lavage with Suction (psi): 12 psi Pulsed Lavage with Suction - Normal Saline Used: 1000 mL (Between all wounds) Pulsed Lavage Tip: Tip with splash shield   Wound Assessment and Plan  Wound Therapy - Assess/Plan/Recommendations Wound Therapy - Clinical Statement: Continued yellow purulent drainage on the L hand. R hand is appropriate for 3x/week as it remains 100% granulation.  Wound Therapy - Functional Problem List: Decreased  strength/AROM of hands/fingers for ADL's and fine motor tasks.  Factors Delaying/Impairing Wound Healing: Substance abuse;Multiple medical problems Hydrotherapy Plan: Debridement;Dressing change;Patient/family education;Pulsatile lavage with suction Wound Therapy - Frequency: 6X / week Wound Therapy - Follow Up Recommendations: Other (comment) (LTAC vs SNF) Wound Plan: See above  Wound Therapy Goals- Improve the function of patient's integumentary system by progressing the wound(s) through the phases of wound healing (inflammation - proliferation - remodeling) by: Decrease Necrotic Tissue to: 0 Decrease Necrotic Tissue - Progress: Progressing toward goal Increase Granulation Tissue to: 100 Increase Granulation Tissue - Progress: Progressing toward goal Patient/Family will be able to : complete dressing changes independently prior to d/c Patient/Family Instruction Goal - Progress: Progressing toward goal Goals/treatment plan/discharge plan were made with and agreed upon by patient/family: No, Patient unable to participate in goals/treatment/discharge plan and family unavailable Time For Goal Achievement: 7 days Wound Therapy - Potential for Goals: Good  Goals will be updated until maximal potential achieved or discharge criteria met.  Discharge criteria: when goals achieved, discharge from hospital, MD decision/surgical intervention, no progress towards goals, refusal/missing three consecutive treatments without notification or medical reason.  GP     Rolinda Roan 11/09/2015, 12:23 PM   Rolinda Roan, PT, DPT Acute Rehabilitation Services Pager: [phone]

## 2015-11-09 NOTE — Progress Notes (Signed)
[facility]    Date of Admission:  10/22/2015   Total days of antibiotics 19 Day 4 of ceftaroline, rifampin   Principal Problem:   MRSA bacteremia Active Problems:   Acute respiratory failure (HCC)   Altered mental status   MRSA pneumonia ([place])   IV drug abuse   Abscess of left hand   Sternal osteomyelitis (HCC)   Septic arthritis of hip (HCC)   Septic arthritis of right ankle (HCC)   Sacral osteomyelitis (HCC)   Abscess of right hand   Abscess of left foot   Encounter for central line placement   Acute pulmonary edema ([place])   . antiseptic oral rinse  7 mL Mouth Rinse QID  . ceFTAROline (TEFLARO) IV  600 mg Intravenous Q8H  . chlorhexidine gluconate (SAGE KIT)  15 mL Mouth Rinse BID  . diazepam  2 mg Per Tube Q8H  . furosemide  40 mg Per Tube Daily  . methadone  20 mg Per Tube Q8H  . pantoprazole sodium  40 mg Per Tube Q24H  . polyethylene glycol  17 g Oral Daily  . QUEtiapine  50 mg Per Tube Q12H  . rifampin  600 mg Oral Daily  . sennosides  5 mL Per Tube BID  . sodium chloride  1,000 mL Intravenous Once    SUBJECTIVE: Mr. Sheldon is now awake alert and oriented, sitting up in bed. He denies any significant distress and does not feel feverish or chills.  Review of Systems: Review of Systems  Constitutional: Negative for fever and chills.  Respiratory: Negative for shortness of breath.   Gastrointestinal: Negative for abdominal pain.  Skin: Negative for itching and rash.    History reviewed. No pertinent past medical history.  Social History  Substance Use Topics  . Smoking status: None  . Smokeless tobacco: None  . Alcohol Use: None    History reviewed. No pertinent family history. Allergies  Allergen Reactions  . Ketorolac Nausea Only    Per [facility] records  . Tramadol Nausea Only    Per [facility] records    OBJECTIVE: Filed  Vitals:   11/09/15 0700 11/09/15 0733 11/09/15 0800 11/09/15 0900  BP: 134/84  126/77 132/69  Pulse: 109  107 113  Temp:  98.8 F (37.1 C)    TempSrc:  Oral    Resp: '17  15 22  ' Height:      Weight:      SpO2: 97%  97% 98%   Body mass index is 22.55 kg/(m^2).  Physical Exam GENERAL- Alert and oriented HEENT- Tracheostomy tube CARDIAC- Tachycardic with a regular rhythm, no murmurs RESP- Bilateral crackles, off ventilator support ABDOMEN- Soft, no guarding or rebound EXTREMITIES- Extremity wounds are well healing with basic gauze dressing not in compression, no edema SKIN- Decreased folliculitis throughout chest, abdomen, extremities   Lab Results Lab Results  Component Value Date   WBC 10.0 11/09/2015   HGB 7.4* 11/09/2015   HCT 22.3* 11/09/2015   MCV 82.3 11/09/2015   PLT 426* 11/09/2015    Lab Results  Component Value Date   CREATININE 0.46* 11/09/2015   BUN 11 11/09/2015   NA 129* 11/09/2015   K 3.7 11/09/2015   CL 94* 11/09/2015   CO2 25 11/09/2015    Lab Results  Component Value Date   ALT 31 11/06/2015   AST 55* 11/06/2015   ALKPHOS 128* 11/06/2015   BILITOT 1.6* 11/06/2015     Microbiology:  Recent Results (from the past 240 hour(s))  Aerobic/Anaerobic Culture (surgical/deep wound)     Status: None   Collection Time: 11/01/15  4:39 PM  Result Value Ref Range Status   Specimen Description ABSCESS  Final   Special Requests LEFT STERNUM PATIENT ON FOLLOWING VANC  Final   Gram Stain   Final    ABUNDANT WBC PRESENT,BOTH PMN AND MONONUCLEAR FEW GRAM POSITIVE COCCI IN CLUSTERS    Culture   Final    FEW METHICILLIN RESISTANT STAPHYLOCOCCUS AUREUS NO ANAEROBES ISOLATED    Report Status 11/06/2015 FINAL  Final   Organism ID, Bacteria METHICILLIN RESISTANT STAPHYLOCOCCUS AUREUS  Final      Susceptibility   Methicillin resistant staphylococcus aureus - MIC*    CIPROFLOXACIN <=0.5 SENSITIVE Sensitive     ERYTHROMYCIN >=8 RESISTANT Resistant     GENTAMICIN  <=0.5 SENSITIVE Sensitive     OXACILLIN >=4 RESISTANT Resistant     TETRACYCLINE <=1 SENSITIVE Sensitive     VANCOMYCIN <=0.5 SENSITIVE Sensitive     TRIMETH/SULFA <=10 SENSITIVE Sensitive     CLINDAMYCIN <=0.25 SENSITIVE Sensitive     RIFAMPIN <=0.5 SENSITIVE Sensitive     Inducible Clindamycin NEGATIVE Sensitive     * FEW METHICILLIN RESISTANT STAPHYLOCOCCUS AUREUS  Culture, blood (routine x 2)     Status: Abnormal   Collection Time: 11/05/15  3:20 PM  Result Value Ref Range Status   Specimen Description BLOOD LEFT ANTECUBITAL  Final   Special Requests IN PEDIATRIC BOTTLE  3CC  Final   Culture  Setup Time   Final    GRAM POSITIVE COCCI IN CLUSTERS AEROBIC BOTTLE ONLY CRITICAL RESULT CALLED TO, READ BACK BY AND VERIFIED WITH: E MARTIN,PHARMD AT 1207 11/06/15 BY L BENFIELD    Culture (A)  Final    STAPHYLOCOCCUS SPECIES (COAGULASE NEGATIVE) THE SIGNIFICANCE OF ISOLATING THIS ORGANISM FROM A SINGLE SET OF BLOOD CULTURES WHEN MULTIPLE SETS ARE DRAWN IS UNCERTAIN. PLEASE NOTIFY THE MICROBIOLOGY DEPARTMENT WITHIN ONE WEEK IF SPECIATION AND SENSITIVITIES ARE REQUIRED.    Report Status 11/08/2015 FINAL  Final  Culture, respiratory (NON-Expectorated)     Status: None   Collection Time: 11/05/15  3:29 PM  Result Value Ref Range Status   Specimen Description TRACHEAL ASPIRATE  Final   Special Requests NONE  Final   Gram Stain   Final    ABUNDANT WBC PRESENT,BOTH PMN AND MONONUCLEAR ABUNDANT GRAM POSITIVE COCCI IN PAIRS FEW GRAM NEGATIVE RODS    Culture   Final    ABUNDANT METHICILLIN RESISTANT STAPHYLOCOCCUS AUREUS   Report Status 11/07/2015 FINAL  Final   Organism ID, Bacteria METHICILLIN RESISTANT STAPHYLOCOCCUS AUREUS  Final      Susceptibility   Methicillin resistant staphylococcus aureus - MIC*    CIPROFLOXACIN <=0.5 SENSITIVE Sensitive     ERYTHROMYCIN >=8 RESISTANT Resistant     GENTAMICIN <=0.5 SENSITIVE Sensitive     OXACILLIN >=4 RESISTANT Resistant     TETRACYCLINE <=1  SENSITIVE Sensitive     VANCOMYCIN 1 SENSITIVE Sensitive     TRIMETH/SULFA <=10 SENSITIVE Sensitive     CLINDAMYCIN <=0.25 SENSITIVE Sensitive     RIFAMPIN <=0.5 SENSITIVE Sensitive     Inducible Clindamycin NEGATIVE Sensitive     * ABUNDANT METHICILLIN RESISTANT STAPHYLOCOCCUS AUREUS  Culture, blood (routine x 2)     Status: None (Preliminary result)   Collection Time: 11/05/15  3:30 PM  Result Value Ref Range Status   Specimen Description BLOOD LEFT ANTECUBITAL  Final   Special Requests  IN PEDIATRIC BOTTLE  3CC  Final   Culture NO GROWTH 3 DAYS  Final   Report Status PENDING  Incomplete  Urine culture     Status: None   Collection Time: 11/05/15  5:53 PM  Result Value Ref Range Status   Specimen Description URINE, CATHETERIZED  Final   Special Requests PATIENT ON FOLLOWING VANC  Final   Culture NO GROWTH  Final   Report Status 11/06/2015 FINAL  Final  Aerobic/Anaerobic Culture (surgical/deep wound)     Status: None (Preliminary result)   Collection Time: 11/06/15  1:53 PM  Result Value Ref Range Status   Specimen Description TISSUE RIGHT HIP  Final   Special Requests PT ON ANCEF  Final   Gram Stain   Final    ABUNDANT WBC PRESENT,BOTH PMN AND MONONUCLEAR NO ORGANISMS SEEN    Culture NO GROWTH 2 DAYS  Final   Report Status PENDING  Incomplete     ASSESSMENT: Disseminated MRSA bacteremia: Mr. Brager is afebrile now and rapidly improving on ceftaroline and rifampin. His multiple surgical wounds all appear to be progressing well. He can anticipate prolonged treatment and blood cultures have remained clear so I agree with exchanging [place] for PICC line today. The safety of this IV access will need to be considered at eventual disposition given IVDU history.  PLAN: 1. Continuing ceftaroline and rifampin  Collier Salina, MD PGY-II Internal Medicine Resident Pager# [phone] 11/09/2015, 11:29 AM

## 2015-11-09 NOTE — Progress Notes (Signed)
[address]       [place] [address]             [phone]      3 Days Post-Op Procedure(s) (LRB): IRRIGATION AND DEBRIDEMENT HIP (Right) Subjective: More alert and conversant on T collar, denies pain currently  Objective: Vital signs in last 24 hours: Temp:  [98.3 F (36.8 C)-99.6 F (37.6 C)] 98.8 F (37.1 C) (07/14 0733) Pulse Rate:  [102-126] 109 (07/14 0700) Cardiac Rhythm:  [-] Sinus tachycardia (07/14 0400) Resp:  [9-25] 17 (07/14 0700) BP: (113-154)/(58-96) 134/84 mmHg (07/14 0700) SpO2:  [97 %-100 %] 97 % (07/14 0700) FiO2 (%):  [8 %-28 %] 28 % (07/14 0400) Weight:  [161 lb 9.6 oz (73.3 kg)] 161 lb 9.6 oz (73.3 kg) (07/14 0500)  Hemodynamic parameters for last 24 hours:    Intake/Output from previous day: 07/13 0701 - 07/14 0700 In: 3731.3 [I.V.:861.3; NG/GT:1520; IV Piggyback:750] Out: 3290 [Urine:3290] Intake/Output this shift:    Wound: separation of sternomanubrial joint, granulation tissue improving  Lab Results:  Recent Labs  11/07/15 0541 11/08/15 0439  WBC 15.0* 11.9*  HGB 7.5* 7.8*  HCT 22.7* 23.9*  PLT 462* 501*   BMET:  Recent Labs  11/07/15 0541 11/08/15 0439  NA 133* 130*  K 4.1 3.8  CL 96* 95*  CO2 28 28  GLUCOSE 126* 113*  BUN 12 9  CREATININE 0.61 0.38*  CALCIUM 8.5* 8.4*    PT/INR: No results for input(s): LABPROT, INR in the last 72 hours. ABG    Component Value Date/Time   PHART 7.446 10/29/2015 0823   HCO3 37.7* 10/29/2015 0823   TCO2 39 10/29/2015 0823   ACIDBASEDEF 1.0 10/22/2015 1832   O2SAT 100.0 10/29/2015 0823   CBG (last 3)   Recent Labs  11/08/15 1959 11/08/15 2341 11/09/15 0345  GLUCAP 134* 120* 120*    Meds Scheduled Meds: . antiseptic oral rinse  7 mL Mouth Rinse QID  . ceFTAROline (TEFLARO) IV  600 mg Intravenous Q8H  . chlorhexidine gluconate (SAGE KIT)  15 mL Mouth Rinse BID  . diazepam  2 mg Per Tube Q8H  . furosemide  40 mg Per Tube Daily  . methadone  20  mg Per Tube Q8H  . pantoprazole sodium  40 mg Per Tube Q24H  . polyethylene glycol  17 g Oral Daily  . QUEtiapine  50 mg Per Tube Q12H  . rifampin  600 mg Oral Daily  . sennosides  5 mL Per Tube BID  . sodium chloride  1,000 mL Intravenous Once   Continuous Infusions: . sodium chloride 10 mL/hr at 11/09/15 0700  . feeding supplement (VITAL AF 1.2 CAL) 1,000 mL (11/09/15 0700)  . fentaNYL infusion INTRAVENOUS Stopped (11/09/15 0600)  . midazolam (VERSED) infusion Stopped (11/09/15 0600)   PRN Meds:.acetaminophen (TYLENOL) oral liquid 160 mg/5 mL, docusate, fentaNYL, haloperidol lactate, ibuprofen, metoprolol, midazolam  Xrays Dg Abd Portable 1v  11/08/2015  CLINICAL DATA:  NG tube placement. EXAM: PORTABLE ABDOMEN - 1 VIEW COMPARISON:  11/06/2015. FINDINGS: The tip of the feeding tube is positioned in the antral pyloric region of the stomach. There is no NG tube visualized on the film. Visualized bowel gas pattern is nonspecific. Telemetry leads overlie the chest. IMPRESSION: Feeding tube tip positioned in the region of the pylorus. No NG tube visualized on this study. Electronically Signed   By: Misty Stanley M.D.   On: 11/08/2015 15:46    Assessment/Plan:  S/P Procedure(s) (LRB): IRRIGATION AND DEBRIDEMENT HIP (Right) 1 conts current tx- may require P[place] flaps   LOS: 18 days    Richanda Darin E 11/09/2015

## 2015-11-10 DIAGNOSIS — M01X71 Direct infection of right ankle and foot in infectious and parasitic diseases classified elsewhere: Secondary | ICD-10-CM

## 2015-11-10 DIAGNOSIS — R7881 Bacteremia: Secondary | ICD-10-CM | POA: Diagnosis present

## 2015-11-10 DIAGNOSIS — F1910 Other psychoactive substance abuse, uncomplicated: Secondary | ICD-10-CM

## 2015-11-10 DIAGNOSIS — A499 Bacterial infection, unspecified: Secondary | ICD-10-CM

## 2015-11-10 DIAGNOSIS — Z22322 Carrier or suspected carrier of Methicillin resistant Staphylococcus aureus: Secondary | ICD-10-CM | POA: Diagnosis present

## 2015-11-10 DIAGNOSIS — M4628 Osteomyelitis of vertebra, sacral and sacrococcygeal region: Secondary | ICD-10-CM

## 2015-11-10 DIAGNOSIS — J9601 Acute respiratory failure with hypoxia: Secondary | ICD-10-CM | POA: Diagnosis present

## 2015-11-10 LAB — GLUCOSE, CAPILLARY
GLUCOSE-CAPILLARY: 115 mg/dL — AB (ref 65–99)
GLUCOSE-CAPILLARY: 126 mg/dL — AB (ref 65–99)
Glucose-Capillary: 112 mg/dL — ABNORMAL HIGH (ref 65–99)
Glucose-Capillary: 109 mg/dL — ABNORMAL HIGH (ref 65–99)
Glucose-Capillary: 120 mg/dL — ABNORMAL HIGH (ref 65–99)

## 2015-11-10 LAB — CULTURE, BLOOD (ROUTINE X 2): CULTURE: NO GROWTH

## 2015-11-10 LAB — BASIC METABOLIC PANEL
ANION GAP: 5 (ref 5–15)
BUN: 13 mg/dL (ref 6–20)
CALCIUM: 8.4 mg/dL — AB (ref 8.9–10.3)
CO2: 28 mmol/L (ref 22–32)
CREATININE: 0.43 mg/dL — AB (ref 0.61–1.24)
Chloride: 100 mmol/L — ABNORMAL LOW (ref 101–111)
GFR calc Af Amer: >60 mL/min (ref >60)
GLUCOSE: 115 mg/dL — AB (ref 65–99)
Potassium: 3.8 mmol/L (ref 3.5–5.1)
Sodium: 133 mmol/L — ABNORMAL LOW (ref 135–145)

## 2015-11-10 LAB — CBC
HCT: 23.9 % — ABNORMAL LOW (ref 39.0–52.0)
HEMOGLOBIN: 7.8 g/dL — AB (ref 13.0–17.0)
MCH: 26.8 pg (ref 26.0–34.0)
MCHC: 32.6 g/dL (ref 30.0–36.0)
MCV: 82.1 fL (ref 78.0–100.0)
PLATELETS: 484 10*3/uL — AB (ref 150–400)
RBC: 2.91 MIL/uL — ABNORMAL LOW (ref 4.22–5.81)
RDW: 14.2 % (ref 11.5–15.5)
WBC: 10.6 10*3/uL — ABNORMAL HIGH (ref 4.0–10.5)
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
## 2015-11-10 NOTE — Progress Notes (Addendum)
      [address]       [place] [id number]             [phone]       4 Days Post-Op Procedure(s) (LRB): IRRIGATION AND DEBRIDEMENT HIP (Right)  Subjective: Awake and alert.  Objective: Vital signs in last 24 hours: Temp:  [98.1 F (36.7 C)-98.5 F (36.9 C)] 98.4 F (36.9 C) (07/15 0849) Pulse Rate:  [92-109] 98 (07/15 0800) Cardiac Rhythm:  [-] Normal sinus rhythm (07/15 0800) Resp:  [14-28] 21 (07/15 0800) BP: (108-137)/(61-91) 125/81 mmHg (07/15 0800) SpO2:  [94 %-99 %] 96 % (07/15 0800) FiO2 (%):  [28 %] 28 % (07/15 0800) Weight:  [158 lb 1.1 oz (71.7 kg)] 158 lb 1.1 oz (71.7 kg) (07/15 0306)     Intake/Output from previous day: 07/14 0701 - 07/15 0700 In: 2640 [I.V.:40; NG/GT:2090; IV Piggyback:500] Out: 2800 [Urine:2800]   Physical Exam:  Cardiovascular: RRR. Pulmonary: Clear to auscultation bilaterally Wound: Clean and dry.  Granulation present. Minor sloughy tissue   Lab Results: CBC: Recent Labs  11/09/15 0815 11/10/15 0309  WBC 10.0 10.6*  HGB 7.4* 7.8*  HCT 22.3* 23.9*  PLT 426* 484*   BMET:  Recent Labs  11/09/15 0815 11/10/15 0309  NA 129* 133*  K 3.7 3.8  CL 94* 100*  CO2 25 28  GLUCOSE 113* 115*  BUN 11 13  CREATININE 0.46* 0.43*  CALCIUM 8.3* 8.4*    PT/INR: No results for input(s): LABPROT, INR in the last 72 hours. ABG:  INR: Will add last result for INR, ABG once components are confirmed Will add last 4 CBG results once components are confirmed  Assessment/Plan:  1. CV - Occasional ST into the low 100's. 2.  Pulmonary - On trach collar 3. ID- on Ceftaroline and Rifampin. Continue sternal dressing changes 4. Anemia-H and H stable at 7.8 and 23.9 5. Management per primary  ZIMMERMAN,DONIELLE MPA-C 11/10/2015,9:04 AM  I have seen and examined the patient and agree with the assessment and plan as outlined.  Purcell Nailslarence H Shanin Szymanowski, MD 11/10/2015 11:40 AM

## 2015-11-10 NOTE — Procedures (Signed)
Tracheostomy Change Note  Patient Details:   Name: Kevin Austin DOB: Sep 13, 1990 MRN: [id number]    Airway Documentation:     Evaluation  O2 sats: stable throughout Complications: No apparent complications Patient did tolerate procedure well. Bilateral Breath Sounds: Diminished, Other (Comment) (coarse)  + easy cap color change (purple to yellow), no distress noted.    Jennette KettleBrowning, Miana Politte Joy 11/10/2015, 4:42 PM

## 2015-11-11 DIAGNOSIS — J9602 Acute respiratory failure with hypercapnia: Secondary | ICD-10-CM | POA: Diagnosis present

## 2015-11-11 DIAGNOSIS — R400 Somnolence: Secondary | ICD-10-CM

## 2015-11-11 LAB — CBC WITH DIFFERENTIAL/PLATELET
BASOS ABS: 0.1 10*3/uL (ref 0.0–0.1)
BASOS PCT: 1 %
EOS ABS: 0.1 10*3/uL (ref 0.0–0.7)
Eosinophils Relative: 1 %
HCT: 24.4 % — ABNORMAL LOW (ref 39.0–52.0)
HEMOGLOBIN: 8 g/dL — AB (ref 13.0–17.0)
Lymphocytes Relative: 23 %
Lymphs Abs: 2.5 10*3/uL (ref 0.7–4.0)
MCH: 26.8 pg (ref 26.0–34.0)
MCHC: 32.8 g/dL (ref 30.0–36.0)
MCV: 81.9 fL (ref 78.0–100.0)
Monocytes Absolute: 1 10*3/uL (ref 0.1–1.0)
Monocytes Relative: 10 %
NEUTROS PCT: 65 %
Neutro Abs: 7 10*3/uL (ref 1.7–7.7)
Platelets: 459 10*3/uL — ABNORMAL HIGH (ref 150–400)
RBC: 2.98 MIL/uL — AB (ref 4.22–5.81)
RDW: 14.3 % (ref 11.5–15.5)
WBC: 10.8 10*3/uL — AB (ref 4.0–10.5)

## 2015-11-11 LAB — BASIC METABOLIC PANEL
ANION GAP: 6 (ref 5–15)
BUN: 15 mg/dL (ref 6–20)
CALCIUM: 8.6 mg/dL — AB (ref 8.9–10.3)
CO2: 27 mmol/L (ref 22–32)
Chloride: 96 mmol/L — ABNORMAL LOW (ref 101–111)
Creatinine, Ser: 0.46 mg/dL — ABNORMAL LOW (ref 0.61–1.24)
Glucose, Bld: 110 mg/dL — ABNORMAL HIGH (ref 65–99)
POTASSIUM: 4.3 mmol/L (ref 3.5–5.1)
Sodium: 129 mmol/L — ABNORMAL LOW (ref 135–145)

## 2015-11-11 LAB — GLUCOSE, CAPILLARY
GLUCOSE-CAPILLARY: 116 mg/dL — AB (ref 65–99)
GLUCOSE-CAPILLARY: 113 mg/dL — AB (ref 65–99)
GLUCOSE-CAPILLARY: 104 mg/dL — AB (ref 65–99)
GLUCOSE-CAPILLARY: 128 mg/dL — AB (ref 65–99)
Glucose-Capillary: 112 mg/dL — ABNORMAL HIGH (ref 65–99)
Glucose-Capillary: 123 mg/dL — ABNORMAL HIGH (ref 65–99)

## 2015-11-11 LAB — MAGNESIUM: Magnesium: 2.1 mg/dL (ref 1.7–2.4)

## 2015-11-11 LAB — AEROBIC/ANAEROBIC CULTURE W GRAM STAIN (SURGICAL/DEEP WOUND): Culture: NO GROWTH

## 2015-11-11 LAB — AEROBIC/ANAEROBIC CULTURE (SURGICAL/DEEP WOUND)

## 2015-11-11 MED ORDER — METHADONE HCL 5 MG PO TABS
15.0000 mg | ORAL_TABLET | Freq: Three times a day (TID) | ORAL | Status: DC
  Administered 2015-11-11: 15 mg
  Filled 2015-11-11: qty 1

## 2015-11-11 MED ORDER — SODIUM CHLORIDE 0.9 % IV SOLN: Freq: Once | INTRAVENOUS | Status: DC

## 2015-11-11 MED ORDER — QUETIAPINE FUMARATE 25 MG PO TABS
75.0000 mg | ORAL_TABLET | Freq: Two times a day (BID) | ORAL | Status: DC
  Administered 2015-11-11: 75 mg
  Filled 2015-11-11: qty 3

## 2015-11-11 NOTE — Progress Notes (Signed)
Pt transferred to 5W05, belongings with patient, family notified.
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
## 2015-11-11 NOTE — Progress Notes (Addendum)
      [address]       [place] [id number]             [phone]       5 Days Post-Op Procedure(s) (LRB): IRRIGATION AND DEBRIDEMENT HIP (Right)  Subjective: Awake and alert. He is wondering when he is able to go home.  Objective: Vital signs in last 24 hours: Temp:  [98.3 F (36.8 C)-99.1 F (37.3 C)] 98.6 F (37 C) (07/16 0830) Pulse Rate:  [87-108] 104 (07/16 0845) Cardiac Rhythm:  [-] Normal sinus rhythm (07/16 0800) Resp:  [13-23] 14 (07/16 0845) BP: (105-130)/(65-90) 126/81 mmHg (07/16 0845) SpO2:  [91 %-98 %] 95 % (07/16 0845) FiO2 (%):  [28 %] 28 % (07/16 0845) Weight:  [152 lb 12.5 oz (69.3 kg)] 152 lb 12.5 oz (69.3 kg) (07/16 0333)     Intake/Output from previous day: 07/15 0701 - 07/16 0700 In: 2800 [I.V.:10; NG/GT:1950; IV Piggyback:750] Out: 1850 [Urine:1850]   Physical Exam:  Cardiovascular: RRR. Pulmonary: Clear to auscultation bilaterally Wound: Clean and dry.  Granulation present. Minor sloughy tissue proximally. Wound is fairly deep.   Lab Results: CBC:  Recent Labs  11/09/15 0815 11/10/15 0309  WBC 10.0 10.6*  HGB 7.4* 7.8*  HCT 22.3* 23.9*  PLT 426* 484*   BMET:   Recent Labs  11/09/15 0815 11/10/15 0309  NA 129* 133*  K 3.7 3.8  CL 94* 100*  CO2 25 28  GLUCOSE 113* 115*  BUN 11 13  CREATININE 0.46* 0.43*  CALCIUM 8.3* 8.4*    PT/INR: No results for input(s): LABPROT, INR in the last 72 hours. ABG:  INR: Will add last result for INR, ABG once components are confirmed Will add last 4 CBG results once components are confirmed  Assessment/Plan:  1. CV - SR in the 90's. 2.  Pulmonary - On trach collar 3. ID- Had low grade fever to 99.1 after midnight. On Ceftaroline and Rifampin. Continue sternal dressing changes 4. Anemia-H and H yesterday stable at 7.8 and 23.9 5. Management per primary  ZIMMERMAN,DONIELLE MPA-C 11/11/2015,9:09 AM  I agree with the assessment and plan as outlined.  Purcell Nailslarence  H Owen, MD 11/11/2015 10:29 AM

## 2015-11-12 ENCOUNTER — Encounter (HOSPITAL_COMMUNITY): Payer: Self-pay | Admitting: *Deleted

## 2015-11-12 DIAGNOSIS — M00059 Staphylococcal arthritis, unspecified hip: Secondary | ICD-10-CM

## 2015-11-12 DIAGNOSIS — R1310 Dysphagia, unspecified: Secondary | ICD-10-CM

## 2015-11-12 DIAGNOSIS — M00071 Staphylococcal arthritis, right ankle and foot: Secondary | ICD-10-CM

## 2015-11-12 LAB — CBC WITH DIFFERENTIAL/PLATELET
BASOS ABS: 0.1 10*3/uL (ref 0.0–0.1)
Basophils Relative: 1 %
Eosinophils Absolute: 0.2 10*3/uL (ref 0.0–0.7)
Eosinophils Relative: 1 %
HCT: 25.7 % — ABNORMAL LOW (ref 39.0–52.0)
HEMOGLOBIN: 8.5 g/dL — AB (ref 13.0–17.0)
LYMPHS ABS: 2.1 10*3/uL (ref 0.7–4.0)
LYMPHS PCT: 15 %
MCH: 27.1 pg (ref 26.0–34.0)
MCHC: 33.1 g/dL (ref 30.0–36.0)
MCV: 81.8 fL (ref 78.0–100.0)
Monocytes Absolute: 1.7 10*3/uL — ABNORMAL HIGH (ref 0.1–1.0)
Monocytes Relative: 12 %
NEUTROS ABS: 9.6 10*3/uL — AB (ref 1.7–7.7)
NEUTROS PCT: 71 %
PLATELETS: 505 10*3/uL — AB (ref 150–400)
RBC: 3.14 MIL/uL — AB (ref 4.22–5.81)
RDW: 14.7 % (ref 11.5–15.5)
WBC: 13.6 10*3/uL — AB (ref 4.0–10.5)

## 2015-11-12 LAB — GLUCOSE, CAPILLARY
GLUCOSE-CAPILLARY: 123 mg/dL — AB (ref 65–99)
GLUCOSE-CAPILLARY: 110 mg/dL — AB (ref 65–99)
GLUCOSE-CAPILLARY: 107 mg/dL — AB (ref 65–99)
GLUCOSE-CAPILLARY: 116 mg/dL — AB (ref 65–99)
Glucose-Capillary: 105 mg/dL — ABNORMAL HIGH (ref 65–99)
Glucose-Capillary: 113 mg/dL — ABNORMAL HIGH (ref 65–99)

## 2015-11-12 LAB — BASIC METABOLIC PANEL
ANION GAP: 8 (ref 5–15)
BUN: 16 mg/dL (ref 6–20)
CHLORIDE: 94 mmol/L — AB (ref 101–111)
CO2: 27 mmol/L (ref 22–32)
Calcium: 8.8 mg/dL — ABNORMAL LOW (ref 8.9–10.3)
Creatinine, Ser: 0.58 mg/dL — ABNORMAL LOW (ref 0.61–1.24)
Glucose, Bld: 119 mg/dL — ABNORMAL HIGH (ref 65–99)
POTASSIUM: 3.9 mmol/L (ref 3.5–5.1)
SODIUM: 129 mmol/L — AB (ref 135–145)

## 2015-11-12 LAB — TYPE AND SCREEN
ABO/RH(D): A NEG
Antibody Screen: NEGATIVE

## 2015-11-12 LAB — MAGNESIUM: Magnesium: 2 mg/dL (ref 1.7–2.4)

## 2015-11-12 MED ORDER — NICOTINE 21 MG/24HR TD PT24
21.0000 mg | MEDICATED_PATCH | Freq: Every day | TRANSDERMAL | Status: DC
  Administered 2015-11-12 – 2015-12-11 (×23): 21 mg via TRANSDERMAL
  Filled 2015-11-12 (×31): qty 1

## 2015-11-12 MED ORDER — ENSURE ENLIVE PO LIQD
237.0000 mL | Freq: Two times a day (BID) | ORAL | Status: DC
  Administered 2015-11-12 – 2015-12-18 (×50): 237 mL via ORAL

## 2015-11-12 MED ORDER — SENNOSIDES 8.8 MG/5ML PO SYRP
5.0000 mL | ORAL_SOLUTION | Freq: Two times a day (BID) | ORAL | Status: DC
  Administered 2015-11-12 – 2015-11-13 (×3): 5 mL via ORAL
  Filled 2015-11-12 (×6): qty 5

## 2015-11-12 MED ORDER — METHADONE HCL 10 MG PO TABS
15.0000 mg | ORAL_TABLET | Freq: Three times a day (TID) | ORAL | Status: DC
  Administered 2015-11-12 – 2015-11-15 (×9): 15 mg via ORAL
  Filled 2015-11-12 (×9): qty 1

## 2015-11-12 MED ORDER — PANTOPRAZOLE SODIUM 40 MG PO PACK
40.0000 mg | PACK | ORAL | Status: DC
  Administered 2015-11-12 – 2015-11-13 (×2): 40 mg via ORAL
  Filled 2015-11-12 (×3): qty 20

## 2015-11-12 MED ORDER — FUROSEMIDE 40 MG PO TABS
40.0000 mg | ORAL_TABLET | Freq: Every day | ORAL | Status: DC
  Administered 2015-11-12 – 2015-11-14 (×3): 40 mg via ORAL
  Filled 2015-11-12 (×3): qty 1

## 2015-11-12 MED ORDER — QUETIAPINE FUMARATE 25 MG PO TABS
75.0000 mg | ORAL_TABLET | Freq: Two times a day (BID) | ORAL | Status: DC
  Administered 2015-11-12 – 2015-12-14 (×63): 75 mg via ORAL
  Filled 2015-11-12 (×2): qty 3
  Filled 2015-11-12: qty 1
  Filled 2015-11-12 (×25): qty 3
  Filled 2015-11-12: qty 1
  Filled 2015-11-12 (×6): qty 3
  Filled 2015-11-12: qty 1
  Filled 2015-11-12 (×3): qty 3
  Filled 2015-11-12: qty 1
  Filled 2015-11-12: qty 3
  Filled 2015-11-12 (×2): qty 1
  Filled 2015-11-12 (×11): qty 3
  Filled 2015-11-12: qty 1
  Filled 2015-11-12 (×2): qty 3
  Filled 2015-11-12: qty 1
  Filled 2015-11-12 (×10): qty 3

## 2015-11-12 MED ORDER — DIAZEPAM 2 MG PO TABS
2.0000 mg | ORAL_TABLET | Freq: Three times a day (TID) | ORAL | Status: DC
  Administered 2015-11-12 – 2015-11-17 (×16): 2 mg via ORAL
  Filled 2015-11-12 (×16): qty 1

## 2015-11-12 MED ORDER — PNEUMOCOCCAL VAC POLYVALENT 25 MCG/0.5ML IJ INJ
0.5000 mL | INJECTION | INTRAMUSCULAR | Status: AC
  Administered 2015-11-13: 0.5 mL via INTRAMUSCULAR
  Filled 2015-11-12: qty 0.5
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
## 2015-11-12 NOTE — Clinical Social Work Note (Signed)
Clinical Social Work Assessment  Patient Details  Name: Kevin Austin MRN: [id number] Date of Birth: 1990/08/31  Date of referral:  11/12/15               Reason for consult:  Facility Placement                Permission sought to share information with:  Family Supports Permission granted to share information::  Yes, Verbal Permission Granted  Name::     Training and development officerHeather  Agency::  [facility]NF[facility]  Relationship::  mom  Contact Information:     Housing/Transportation Living arrangements for the past 2 months:  Single Family Home (hospital for 21 days) Source of Information:  Patient Patient Interpreter Needed:  None Criminal Activity/Legal Involvement Pertinent to Current Situation/Hospitalization:  No - Comment as needed Significant Relationships:  Parents Lives with:  Parents Do you feel safe going back to the place where you live?  Yes Need for family participation in patient care:  No (Coment)  Care giving concerns:  Pt lives at home with mom- due to pt extensive drug use history unable to return home with PICC line to complete IV antibiotics   Social Worker assessment / plan:  CSW spoke with pt concerning recommendation for SNF to complete IV antibiotics.  Pt was alert and oriented and very polite during interaction.  Pt expressed understanding that he needs continuing antibiotics to clear his infections and that this is unable to happen at home.  Employment status:  Unemployed Health and safety inspectornsurance information:  Self Pay (Medicaid Pending) PT Recommendations:  Skilled Nursing Facility Information / Referral to community resources:  Skilled Nursing Facility  Patient/Family's Response to care: Pt is agreeable though disheartened he has so much time in a facility in front of him.  Patient/Family's Understanding of and Emotional Response to Diagnosis, Current Treatment, and Prognosis:  Pt has good understanding of his care at this time but seems somewhat depressed at prospect of how much care he still  needs.  Emotional Assessment Appearance:  Appears stated age Attitude/Demeanor/Rapport:    Affect (typically observed):  Appropriate Orientation:  Oriented to Self, Oriented to Place, Oriented to  Time, Oriented to Situation Alcohol / Substance use:  Illicit Drugs Psych involvement (Current and /or in the community):  No (Comment)  Discharge Needs  Concerns to be addressed:  Home Safety Concerns, Care Coordination, Substance Abuse Concerns Readmission within the last 30 days:  No Current discharge risk:  Physical Impairment, Substance Abuse Barriers to Discharge:  Continued Medical Work up   [employer]oman, Berthe Oley M, LCSW 11/12/2015, 5:03 PM

## 2015-11-12 NOTE — Progress Notes (Signed)
Patient pulled out NG tube, refused blood and IV antibiotics. Attempted to remove IV in patient right forearm patient refused. Patient confused and anxious, called patient mother Herbert SetaHeather to calm the patient and provide reassurance. Ask patient Mother could she come stay overnight with him, said she was unavailable, will be here to visit at noon on 7/17. Paged oncall NP. Awaiting response. Will continue to monitor.

## 2015-11-12 NOTE — Progress Notes (Signed)
Physical Therapy Wound Treatment Patient Details  Name: Kevin Austin MRN: [id number] Date of Birth: 06/28/1990  Today's Date: 11/12/2015 Time: 1030-1057 Time Calculation (min): 27 min  Subjective  Subjective: Pt awake and alert during session. Does not endorse pain.  Patient and Family Stated Goals: None stated Date of Onset:  (Unknown) Prior Treatments: I&D 6/27  Pain Score: Pt states he is in no pain this session.   Wound Assessment  Wound / Incision (Open or Dehisced) 10/25/15 Incision - Open Hand Right Dorsal aspect - Ulnar side (Active)  Dressing Type Compression wrap;Moist to dry;Gauze (Comment) 11/12/2015 11:08 AM  Dressing Changed Changed 11/12/2015 11:08 AM  Dressing Status Clean;Dry;Intact 11/12/2015 11:08 AM  Dressing Change Frequency Daily 11/12/2015 11:08 AM  Site / Wound Assessment Red;Bleeding 11/12/2015 11:08 AM  % Wound base Red or Granulating 100% 11/12/2015 11:08 AM  % Wound base Yellow 0% 11/12/2015 11:08 AM  % Wound base Black 0% 11/12/2015 11:08 AM  % Wound base Other (Comment) 0% 11/12/2015 11:08 AM  Peri-wound Assessment Intact 11/12/2015 11:08 AM  Wound Length (cm) 5.5 cm 11/03/2015 11:00 AM  Wound Width (cm) 2.7 cm 11/03/2015 11:00 AM  Wound Depth (cm) 0.5 cm 11/03/2015 11:00 AM  Undermining (cm) Undermines at 3:00 to a depth of 1.2 11/03/2015 11:00 AM  Margins Unattached edges (unapproximated) 11/12/2015 11:08 AM  Closure None 11/12/2015 11:08 AM  Drainage Amount Minimal 11/12/2015 11:08 AM  Drainage Description Sanguineous 11/12/2015 11:08 AM  Non-staged Wound Description Not applicable 0/01/2724 36:64 AM  Treatment Hydrotherapy (Pulse lavage);Packing (Saline gauze) 11/12/2015 11:08 AM     Wound / Incision (Open or Dehisced) 10/25/15 Incision - Open Hand Right Dorsal aspect - Radial side (Active)  Dressing Type Compression wrap;Gauze (Comment);Moist to dry;Moisture barrier 11/12/2015 11:08 AM  Dressing Changed Changed 11/12/2015 11:08 AM  Dressing Status Clean;Dry;Intact  11/12/2015 11:08 AM  Dressing Change Frequency Daily 11/12/2015 11:08 AM  Site / Wound Assessment Red;Bleeding 11/12/2015 11:08 AM  % Wound base Red or Granulating 100% 11/12/2015 11:08 AM  % Wound base Yellow 0% 11/12/2015 11:08 AM  % Wound base Black 0% 11/12/2015 11:08 AM  % Wound base Other (Comment) 0% 11/12/2015 11:08 AM  Peri-wound Assessment Intact 11/12/2015 11:08 AM  Wound Length (cm) 4.3 cm 11/03/2015 11:00 AM  Wound Width (cm) 1.5 cm 11/03/2015 11:00 AM  Wound Depth (cm) 0.5 cm 11/03/2015 11:00 AM  Undermining (cm) Undermining at 9:00 to a depth of 1.0.   11/03/2015 11:00 AM  Margins Unattached edges (unapproximated) 11/12/2015 11:08 AM  Closure None 11/12/2015 11:08 AM  Drainage Amount Minimal 11/12/2015 11:08 AM  Drainage Description Sanguineous 11/12/2015 11:08 AM  Non-staged Wound Description Not applicable 07/29/4740 59:56 AM  Treatment Hydrotherapy (Pulse lavage);Packing (Saline gauze) 11/12/2015 11:08 AM     Wound / Incision (Open or Dehisced) 10/25/15 Incision - Open Hand Left Dorsal aspect (Active)  Dressing Type Compression wrap;Moist to dry;Gauze (Comment) 11/12/2015 11:08 AM  Dressing Changed Changed 11/12/2015 11:08 AM  Dressing Status Clean;Dry;Intact 11/12/2015 11:08 AM  Dressing Change Frequency Daily 11/12/2015 11:08 AM  Site / Wound Assessment Pink;Yellow;Red 11/12/2015 11:08 AM  % Wound base Red or Granulating 95% 11/12/2015 11:08 AM  % Wound base Yellow 5% 11/12/2015 11:08 AM  % Wound base Black 0% 11/12/2015 11:08 AM  % Wound base Other (Comment) 0% 11/12/2015 11:08 AM  Peri-wound Assessment Intact 11/12/2015 11:08 AM  Wound Length (cm) 3.3 cm 11/03/2015 11:00 AM  Wound Width (cm) 1.5 cm 11/03/2015 11:00 AM  Wound Depth (cm)  1.5 cm 11/03/2015 11:00 AM  Undermining (cm) Undermining at 4:00 1.0cm and at 9:00 2.0cm. 11/03/2015 11:00 AM  Margins Unattached edges (unapproximated) 11/12/2015 11:08 AM  Closure None 11/12/2015 11:08 AM  Drainage Amount Minimal 11/12/2015 11:08 AM  Drainage  Description Serosanguineous;Purulent 11/12/2015 11:08 AM  Non-staged Wound Description Not applicable 2/69/4854 62:70 AM  Treatment Hydrotherapy (Pulse lavage);Packing (Impregnated strip) 11/12/2015 11:08 AM     Wound / Incision (Open or Dehisced) 10/25/15 Incision - Open Hand Left Palmar aspect (Active)  Dressing Type Compression wrap;Gauze (Comment);Moist to dry 11/12/2015 11:08 AM  Dressing Changed Changed 11/12/2015 11:08 AM  Dressing Status Clean;Dry;Intact 11/12/2015 11:08 AM  Dressing Change Frequency Daily 11/12/2015 11:08 AM  Site / Wound Assessment Pink;Yellow 11/12/2015 11:08 AM  % Wound base Red or Granulating 95% 11/12/2015 11:08 AM  % Wound base Yellow 5% 11/12/2015 11:08 AM  % Wound base Black 0% 11/12/2015 11:08 AM  % Wound base Other (Comment) 0% 11/12/2015 11:08 AM  Peri-wound Assessment Intact;Maceration 11/12/2015 11:08 AM  Wound Length (cm) 0.4 cm 11/03/2015 11:00 AM  Wound Width (cm) 3.5 cm 11/03/2015 11:00 AM  Wound Depth (cm) 1.8 cm 11/03/2015 11:00 AM  Undermining (cm) Undermining 4:00 - 7:00 2.0cm. 11/03/2015 11:00 AM  Margins Unattached edges (unapproximated) 11/12/2015 11:08 AM  Closure None 11/12/2015 11:08 AM  Drainage Amount Minimal 11/12/2015 11:08 AM  Drainage Description Serosanguineous;Purulent 11/12/2015 11:08 AM  Non-staged Wound Description Not applicable 3/50/0938 18:29 AM  Treatment Hydrotherapy (Pulse lavage);Packing (Impregnated strip) 11/12/2015 11:08 AM   Hydrotherapy Pulsed lavage therapy - wound location: Bilateral hands Pulsed Lavage with Suction (psi): 12 psi Pulsed Lavage with Suction - Normal Saline Used: 1000 mL (Between all wounds) Pulsed Lavage Tip: Tip with splash shield   Wound Assessment and Plan  Wound Therapy - Assess/Plan/Recommendations Wound Therapy - Clinical Statement: Continued yellow purulent drainage on the L hand. R hand is appropriate for 3x/week as it remains 100% granulation.  Wound Therapy - Functional Problem List: Decreased  strength/AROM of hands/fingers for ADL's and fine motor tasks.  Factors Delaying/Impairing Wound Healing: Substance abuse;Multiple medical problems Hydrotherapy Plan: Debridement;Dressing change;Patient/family education;Pulsatile lavage with suction Wound Therapy - Frequency: 6X / week Wound Therapy - Follow Up Recommendations: Other (comment) (LTAC vs SNF) Wound Plan: See above  Wound Therapy Goals- Improve the function of patient's integumentary system by progressing the wound(s) through the phases of wound healing (inflammation - proliferation - remodeling) by: Decrease Necrotic Tissue to: 0 Decrease Necrotic Tissue - Progress: Progressing toward goal Increase Granulation Tissue to: 100 Increase Granulation Tissue - Progress: Progressing toward goal Patient/Family will be able to : complete dressing changes independently prior to d/c Patient/Family Instruction Goal - Progress: Progressing toward goal Goals/treatment plan/discharge plan were made with and agreed upon by patient/family: No, Patient unable to participate in goals/treatment/discharge plan and family unavailable Time For Goal Achievement: 7 days Wound Therapy - Potential for Goals: Good  Goals will be updated until maximal potential achieved or discharge criteria met.  Discharge criteria: when goals achieved, discharge from hospital, MD decision/surgical intervention, no progress towards goals, refusal/missing three consecutive treatments without notification or medical reason.  GP     Rolinda Roan 11/12/2015, 11:11 AM   Rolinda Roan, PT, DPT Acute Rehabilitation Services Pager: [phone]

## 2015-11-12 NOTE — Progress Notes (Signed)
Nutrition Follow-up  DOCUMENTATION CODES:   Not applicable  INTERVENTION:  -Ensure Enlive po BID, each supplement provides 350 kcal and 20 grams of protein -RD to continue to monitor  NUTRITION DIAGNOSIS:   Inadequate oral intake related to inability to eat as evidenced by NPO status. -Resolving, on regular diet as of today  GOAL:   Patient will meet greater than or equal to 90% of their needs -Not meeting  MONITOR:   Vent status, Labs, Weight trends, TF tolerance, Skin, I & O's  REASON FOR ASSESSMENT:   Consult Enteral/tube feeding initiation and management  ASSESSMENT:   25 year old admitted with severe sepsis, multiple septic emboli. 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  Pt pulled out NG tube early this morning, no longer receiving tube feeds. Seen by SLP today, diet advanced to regular. Pt tolerated diet with PMSV no issues - Can speak clearly. He is 6 days s/p I&D of R Hip Complains of some nausea this morning, but is otherwise ok. Will monitor for PO intake. Re-assessed physically, despite 14.5kg wt loss since admit, pt does not exhibit any signs of muscle wasting or fat depletions.  Labs and Medications reviewed: Na 129, Albumin 1.7 Lasix, Miralax, Senokot  Diet Order:  Diet regular Room service appropriate?: Yes; Fluid consistency:: Thin  Skin:  Wound (see comment) (abscesses to foot and hand, s/p I&D 6/27)  Last BM:  PTA  Height:   Ht Readings from Last 1 Encounters:  10/22/15 5\' 11"  (1.803 m)    Weight:   Wt Readings from Last 1 Encounters:  11/12/15 151 lb 7.3 oz (68.7 kg)    Ideal Body Weight:  78.2 kg  BMI:  Body mass index is 21.13 kg/(m^2).  Estimated Nutritional Needs:   Kcal:  2380  Protein:  140-160 gm  Fluid:  2.5 L  EDUCATION NEEDS:   No education needs identified at this time  Dionne AnoWilliam M. Dayra Rapley, MS, RD LDN Inpatient Clinical Dietitian Pager [phone]
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
## 2015-11-12 NOTE — Progress Notes (Addendum)
      [address]       [place] [id number]             [phone]      6 Days Post-Op Procedure(s) (LRB): IRRIGATION AND DEBRIDEMENT HIP (Right)   Subjective:  Seems depressed, uncooperative  Objective: Vital signs in last 24 hours: Temp:  [98.4 F (36.9 C)-98.8 F (37.1 C)] 98.8 F (37.1 C) (07/17 0541) Pulse Rate:  [95-114] 110 (07/17 0807) Cardiac Rhythm:  [-] Sinus tachycardia (07/17 0700) Resp:  [16-23] 17 (07/17 0807) BP: (103-138)/(53-89) 103/53 mmHg (07/17 0541) SpO2:  [93 %-100 %] 96 % (07/17 0807) FiO2 (%):  [28 %] 28 % (07/17 0807) Weight:  [151 lb 7.3 oz (68.7 kg)] 151 lb 7.3 oz (68.7 kg) (07/17 0619)  Intake/Output from previous day: 07/16 0701 - 07/17 0700 In: 810 [NG/GT:560; IV Piggyback:250] Out: 1000 [Urine:1000]  General appearance: alert, no distress and uncooperative Heart: regular rate and rhythm Lungs: clear to auscultation bilaterally Wound: dressing in place  Lab Results:  Recent Labs  11/10/15 0309 11/11/15 0946  WBC 10.6* 10.8*  HGB 7.8* 8.0*  HCT 23.9* 24.4*  PLT 484* 459*   BMET:  Recent Labs  11/10/15 0309 11/11/15 0946  NA 133* 129*  K 3.8 4.3  CL 100* 96*  CO2 28 27  GLUCOSE 115* 110*  BUN 13 15  CREATININE 0.43* 0.46*  CALCIUM 8.4* 8.6*    PT/INR: No results for input(s): LABPROT, INR in the last 72 hours. ABG    Component Value Date/Time   PHART 7.446 10/29/2015 0823   HCO3 37.7* 10/29/2015 0823   TCO2 39 10/29/2015 0823   ACIDBASEDEF 1.0 10/22/2015 1832   O2SAT 100.0 10/29/2015 0823   CBG (last 3)   Recent Labs  11/12/15 0012 11/12/15 0414 11/12/15 0717  GLUCAP 113* 123* 110*    Assessment/Plan: S/P Procedure(s) (LRB): IRRIGATION AND DEBRIDEMENT HIP (Right)  1. Sternal wound- not looked as patient would not allow me to remove dressing 2. Dispo- will try again later today, care per primary   LOS: 21 days    [name], ERIN 11/12/2015  Patient seen and examined, agree with  above Chest wound clean with majority granulating  Viviann SpareSteven C. Dorris FetchHendrickson, MD [facility] [phone]

## 2015-11-12 NOTE — Progress Notes (Signed)
Kevin Austin for Infectious Disease    Date of Admission:  10/22/2015   Total days of antibiotics 22 Day 7 of ceftaroline, rifampin  Principal Problem:   MRSA bacteremia Active Problems:   Acute respiratory failure (HCC)   Altered mental status   MRSA pneumonia ([place])   IV drug abuse   Abscess of left hand   Sternal osteomyelitis (HCC)   Septic arthritis of hip (HCC)   Septic arthritis of right ankle (HCC)   Sacral osteomyelitis (HCC)   Abscess of right hand   Abscess of left foot   Encounter for central line placement   Acute pulmonary edema (HCC)   Acute respiratory failure with hypoxemia (HCC)   Bacteremia   MRSA (methicillin resistant staph aureus) culture positive   Acute respiratory failure with hypercapnia ([place])   . sodium chloride   Intravenous Once  . antiseptic oral rinse  7 mL Mouth Rinse QID  . ceFTAROline (TEFLARO) IV  600 mg Intravenous Q8H  . chlorhexidine gluconate (SAGE KIT)  15 mL Mouth Rinse BID  . diazepam  2 mg Oral Q8H  . [START ON 11/13/2015] furosemide  40 mg Oral Daily  . methadone  15 mg Oral Q8H  . pantoprazole sodium  40 mg Oral Q24H  . polyethylene glycol  17 g Oral Daily  . QUEtiapine  75 mg Oral Q12H  . rifampin  600 mg Oral Daily  . sennosides  5 mL Oral BID  . sodium chloride flush  10-40 mL Intracatheter Q12H    SUBJECTIVE: Kevin Austin is doing well today, but he feels a moderate amount of healthcare related anxiety. His pain is well controlled.  Review of Systems: Review of Systems  Constitutional: Negative for fever and chills.  Respiratory: Negative for shortness of breath.   Gastrointestinal: Negative for abdominal pain.  Musculoskeletal: Negative for myalgias.  Skin: Negative for rash.  Psychiatric/Behavioral: The patient is nervous/anxious.     History reviewed. No pertinent past medical history.  Social History  Substance Use  Topics  . Smoking status: None  . Smokeless tobacco: None  . Alcohol Use: None    History reviewed. No pertinent family history. Allergies  Allergen Reactions  . Ketorolac Nausea Only    Per [facility] records  . Tramadol Nausea Only    Per [facility] records    OBJECTIVE: Filed Vitals:   11/12/15 3437 11/12/15 0807 11/12/15 1237 11/12/15 1250  BP:    131/70  Pulse:  110  123  Temp:    98.3 F (36.8 C)  TempSrc:    Oral  Resp:  '17 16 18  ' Height:      Weight: 68.7 kg (151 lb 7.3 oz)     SpO2:  96% 98% 96%   Body mass index is 21.13 kg/(m^2).  Physical Exam GENERAL- Alert and oriented HEENT- Tracheostomy tube CARDIAC- Tachycardic with a regular rhythm, no murmurs, sternal wound dressing left in place  RESP- Bilateral crackles ABDOMEN- Soft, no guarding or rebound EXTREMITIES- hands in ACE wraps, foot wounds clean and intact, no edema SKIN- Warm, dry, no rashes   Lab Results Lab Results  Component Value Date   WBC 10.8* 11/11/2015   HGB 8.0* 11/11/2015   HCT 24.4* 11/11/2015   MCV 81.9 11/11/2015   PLT 459* 11/11/2015    Lab Results  Component Value Date   CREATININE 0.46* 11/11/2015   BUN 15 11/11/2015   NA 129* 11/11/2015   K 4.3 11/11/2015  CL 96* 11/11/2015   CO2 27 11/11/2015    Lab Results  Component Value Date   ALT 31 11/06/2015   AST 55* 11/06/2015   ALKPHOS 128* 11/06/2015   BILITOT 1.6* 11/06/2015     Microbiology: Recent Results (from the past 240 hour(s))  Culture, blood (routine x 2)     Status: Abnormal   Collection Time: 11/05/15  3:20 PM  Result Value Ref Range Status   Specimen Description BLOOD LEFT ANTECUBITAL  Final   Special Requests IN PEDIATRIC BOTTLE  3CC  Final   Culture  Setup Time   Final    GRAM POSITIVE COCCI IN CLUSTERS AEROBIC BOTTLE ONLY CRITICAL RESULT CALLED TO, READ BACK BY AND VERIFIED WITH: E MARTIN,PHARMD AT 1207 11/06/15 BY L BENFIELD    Culture (A)  Final    STAPHYLOCOCCUS SPECIES (COAGULASE  NEGATIVE) THE SIGNIFICANCE OF ISOLATING THIS ORGANISM FROM A SINGLE SET OF BLOOD CULTURES WHEN MULTIPLE SETS ARE DRAWN IS UNCERTAIN. PLEASE NOTIFY THE MICROBIOLOGY DEPARTMENT WITHIN ONE WEEK IF SPECIATION AND SENSITIVITIES ARE REQUIRED.    Report Status 11/08/2015 FINAL  Final  Culture, respiratory (NON-Expectorated)     Status: None   Collection Time: 11/05/15  3:29 PM  Result Value Ref Range Status   Specimen Description TRACHEAL ASPIRATE  Final   Special Requests NONE  Final   Gram Stain   Final    ABUNDANT WBC PRESENT,BOTH PMN AND MONONUCLEAR ABUNDANT GRAM POSITIVE COCCI IN PAIRS FEW GRAM NEGATIVE RODS    Culture   Final    ABUNDANT METHICILLIN RESISTANT STAPHYLOCOCCUS AUREUS   Report Status 11/07/2015 FINAL  Final   Organism ID, Bacteria METHICILLIN RESISTANT STAPHYLOCOCCUS AUREUS  Final      Susceptibility   Methicillin resistant staphylococcus aureus - MIC*    CIPROFLOXACIN <=0.5 SENSITIVE Sensitive     ERYTHROMYCIN >=8 RESISTANT Resistant     GENTAMICIN <=0.5 SENSITIVE Sensitive     OXACILLIN >=4 RESISTANT Resistant     TETRACYCLINE <=1 SENSITIVE Sensitive     VANCOMYCIN 1 SENSITIVE Sensitive     TRIMETH/SULFA <=10 SENSITIVE Sensitive     CLINDAMYCIN <=0.25 SENSITIVE Sensitive     RIFAMPIN <=0.5 SENSITIVE Sensitive     Inducible Clindamycin NEGATIVE Sensitive     * ABUNDANT METHICILLIN RESISTANT STAPHYLOCOCCUS AUREUS  Culture, blood (routine x 2)     Status: None   Collection Time: 11/05/15  3:30 PM  Result Value Ref Range Status   Specimen Description BLOOD LEFT ANTECUBITAL  Final   Special Requests IN PEDIATRIC BOTTLE  3CC  Final   Culture NO GROWTH 5 DAYS  Final   Report Status 11/10/2015 FINAL  Final  Urine culture     Status: None   Collection Time: 11/05/15  5:53 PM  Result Value Ref Range Status   Specimen Description URINE, CATHETERIZED  Final   Special Requests PATIENT ON FOLLOWING [facility]  Final   Culture NO GROWTH  Final   Report Status 11/06/2015 FINAL   Final  Aerobic/Anaerobic Culture (surgical/deep wound)     Status: None   Collection Time: 11/06/15  1:53 PM  Result Value Ref Range Status   Specimen Description TISSUE RIGHT HIP  Final   Special Requests PT ON ANCEF  Final   Gram Stain   Final    ABUNDANT WBC PRESENT,BOTH PMN AND MONONUCLEAR NO ORGANISMS SEEN    Culture No growth aerobically or anaerobically.  Final   Report Status 11/11/2015 FINAL  Final     ASSESSMENT: Disseminated  MRSA bacteremia: Kevin Austin is now greatly improved and afebrile for several days. Given his extensive infection and osteomyelitis he will need at least 6 weeks of IV antibiotics, and likely to continue oral treatment after that time. We can probably treat his start of ceftaroline on 7/11 as the beginning date since fevers did not end and surgeries were ongoing until then. Also consider that we are treating with rifampin and this can decrease the effect of methadone.  PLAN: 1. Continue ceftaroline and rifampin through at least 8/22, can take rifampin PO 2. Evaluate clinically if methadone dose adjustment is needed on rifampin  Collier Salina, MD PGY-II Internal Medicine Resident Pager# [phone] 11/12/2015, 1:40 PM

## 2015-11-12 NOTE — Progress Notes (Signed)
Physical Therapy Treatment Patient Details Name: Kevin Austin MRN: [id number] DOB: 1990-07-05 Today's Date: 11/12/2015    History of Present Illness Pt adm with disseminated MRSA bacteremia. Pt with repeated treatment with surgical drainage of large abscesses including bil hands, bil ankles, sternum, and rt hip. Pt intubated 6/26. Self extubated and then reintubated. Pt now with trach and on trach collar. PMH - IV drug abuse,     PT Comments    Patient progressing well towards PT goals. Eager to work with PT today. Tolerated short bouts of gait training with Min A for balance/safety and close chair follow due to weakness and fatigue. HR up to 140 bpm during session but Sp02 remained >95% on RA. Pt with increased pain of 10/10 at end of session in feet requesting to return to bed. Will follow acutely. Will need to determine level of assist pt has at home.    Follow Up Recommendations   (pending progress and level of assist at home)     Equipment Recommendations  Rolling walker with 5" wheels    Recommendations for Other Services       Precautions / Restrictions Precautions Precautions: Fall Precaution Comments: watch HR Restrictions RLE Weight Bearing: Weight bearing as tolerated LLE Weight Bearing: Weight bearing as tolerated    Mobility  Bed Mobility Overal bed mobility: Needs Assistance Bed Mobility: Sit to Supine       Sit to supine: Modified independent (Device/Increase time)   General bed mobility comments: ABle to bring BLEs into bed without assist. Sitting EOB upon PT arrival.   Transfers Overall transfer level: Needs assistance Equipment used: Rolling walker (2 wheeled) Transfers: Sit to/from [employer] CorporationStand;Stand Pivot Transfers Sit to Stand: Min guard Stand pivot transfers: Min assist;+2 physical assistance;+2 safety/equipment       General transfer comment: Min guard for safety. Stood from AllstateEOB x1, from chair x2 with cues for hand placement/technique. Transferred to  chair post ambulation. SPT chair to bed with assist of 2.  Ambulation/Gait Ambulation/Gait assistance: Min assist;+2 safety/equipment Ambulation Distance (Feet): 10 Feet (+ 10' + 12') Assistive device: Rolling walker (2 wheeled) Gait Pattern/deviations: Step-through pattern;Decreased stride length;Trunk flexed   Gait velocity interpretation: Below normal speed for age/gender General Gait Details: Slow, mildly unsteady gait with close chair follow. Fatigues quickly requiring 2 seated rest breaks. Bil knee instability noted. HR up to 140 bpm or higher during ambulation. Sp02 >95% on RA throughout.    Stairs            Wheelchair Mobility    Modified Rankin (Stroke Patients Only)       Balance Overall balance assessment: Needs assistance Sitting-balance support: Feet supported;No upper extremity supported Sitting balance-Leahy Scale: Good     Standing balance support: During functional activity Standing balance-Leahy Scale: Poor Standing balance comment: Reliant on BUEs for support.                    Cognition Arousal/Alertness: Awake/alert Behavior During Therapy: WFL for tasks assessed/performed Overall Cognitive Status: Within Functional Limits for tasks assessed                      Exercises      General Comments General comments (skin integrity, edema, etc.): Discussed importance of sitting up in chair.      Pertinent Vitals/Pain Pain Assessment: 0-10 Pain Score: 10-Worst pain ever Pain Location: bil feet Pain Descriptors / Indicators: Sore;Sharp Pain Intervention(s): Monitored during session;Repositioned;Patient requesting pain meds-RN notified;Limited activity within  patient's tolerance    Home Living                      Prior Function            PT Goals (current goals can now be found in the care plan section) Progress towards PT goals: Progressing toward goals    Frequency  Min 3X/week    PT Plan Current plan  remains appropriate    Co-evaluation             End of Session Equipment Utilized During Treatment: Gait belt Activity Tolerance: Treatment limited secondary to medical complications (Comment) (elevated HR) Patient left: in bed;with call bell/phone within reach;with bed alarm set     Time: 4540-9811 PT Time Calculation (min) (ACUTE ONLY): 33 min  Charges:  $Gait Training: 23-37 mins                    G Codes:      Linzy Laury A Saisha Hogue 11/12/2015, 3:21 PM Mylo Red, PT, DPT [phone]
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
## 2015-11-12 NOTE — Care Management Note (Signed)
Case Management Note  Patient Details  Name: Kevin Austin MRN: [id number] Date of Birth: 1991-02-16  Subjective/Objective:                 Patient transferred from [facility] over the weekend. Admitted with multiple septic emboli (sternum, lungs, kidney, etc.) MRSA bacteremia, Cavitary PNA, AMS. Has trach and pulled out feeding tube this morning, now eating.   Per ID consult: "Disseminated MRSA bacteremia: Mr. Kevin Austin is now greatly improved and afebrile for several days. Given his extensive infection and osteomyelitis he will need at least 6 weeks of IV antibiotics, and likely to continue oral treatment after that time. We can probably treat his start of ceftaroline on 7/11 as the beginning date since fevers did not end and surgeries were ongoing until then. Also consider that we are treating with rifampin and this can decrease the effect of methadone. PLAN: Continue ceftaroline and rifampin through at least 8/22, can take rifampin PO. Evaluate clinically if methadone dose adjustment is needed on rifampin." Per notes, CSW consulted for SNF placement 2/2 wounds and h/o IV drug use needing long term IV Abx with PICC.   Action/Plan:  Anticipate DC to SNF.  Expected Discharge Date:                  Expected Discharge Plan:  Skilled Nursing Facility  In-House Referral:  Clinical Social Work  Discharge planning Services  CM Consult  Post Acute Care Choice:    Choice offered to:     DME Arranged:    DME Agency:     HH Arranged:    HH Agency:     Status of Service:  In process, will continue to follow  If discussed at Long Length of Stay Meetings, dates discussed:    Additional Comments:  Kevin SabalDebbie Jenalyn Girdner, RN 11/12/2015, 2:46 PM

## 2015-11-12 NOTE — Progress Notes (Signed)
The patient attempted to pull out trachea while the nurse and respiratory therapist cleaned his cannula. Called patient mother Herbert SetaHeather, she spoke with the patient and calmed him down. Paged oncall NP, notified AC patient need a Recruitment consultantsafety sitter.

## 2015-11-12 NOTE — NC FL2 (Signed)
[place] MEDICAID FL2 LEVEL OF CARE SCREENING TOOL     IDENTIFICATION  Patient Name: Kevin Austin Birthdate: 1990-07-06 Sex: male Admission Date (Current Location): 10/22/2015  [facility] and [place] Number:  [employer] and Address:  [facility], [place] [address] [place], [place] [address]      Provider Number: [id number]  Attending Physician Name and Address:  Eugenie Filler, MD  Relative Name and Phone Number:       Current Level of Care: Hospital Recommended Level of Care: [place] Prior Approval Number:    Date Approved/Denied:   PASRR Number:    Discharge Plan: SNF    Current Diagnoses: Patient Active Problem List   Diagnosis Date Noted  . Acute respiratory failure with hypercapnia ([place])   . Acute respiratory failure with hypoxemia ([place])   . Bacteremia   . MRSA (methicillin resistant staph aureus) culture positive   . IV drug abuse 11/08/2015  . Septic arthritis of right ankle ([place]) 11/08/2015  . Sacral osteomyelitis ([place]) 11/08/2015  . Abscess of right hand 11/08/2015  . Abscess of left foot 11/08/2015  . Septic arthritis of hip ([place]) 11/07/2015  . Acute pulmonary edema (HCC)   . MRSA bacteremia 10/29/2015  . MRSA pneumonia ([place])   . Sternal osteomyelitis ([place])   . Altered mental status   . Encounter for central line placement   . Abscess of left hand   . Acute respiratory failure ([place]) 10/22/2015    Orientation RESPIRATION BLADDER Height & Weight     Self, Time, Situation, Place  Tracheostomy (currently on room air) Continent Weight: 68.7 kg (151 lb 7.3 oz) Height:  '5\' 11"'  (180.3 cm)  BEHAVIORAL SYMPTOMS/MOOD NEUROLOGICAL BOWEL NUTRITION STATUS      Continent    AMBULATORY STATUS COMMUNICATION OF NEEDS Skin   Extensive Assist Verbally Surgical wounds, Other (Comment) (hip, sternal, hand, ankles wounds)                       Personal Care Assistance Level of Assistance  Bathing, Dressing  Bathing Assistance: Maximum assistance   Dressing Assistance: Maximum assistance     Functional Limitations Info             SPECIAL CARE FACTORS FREQUENCY  PT (By licensed PT), OT (By licensed OT)     PT Frequency: 5/wk OT Frequency: 5/wk            Contractures      Additional Factors Info  Code Status, Allergies, Isolation Precautions, Suctioning Needs, Psychotropic Code Status Info: FULL Allergies Info: Ketorolac, Tramadol Psychotropic Info: seroquel   Isolation Precautions Info: MRSA Suctioning Needs: strong cough,low to no suctioning needs   Current Medications (11/12/2015):  This is the current hospital active medication list Current Facility-Administered Medications  Medication Dose Route Frequency Provider Last Rate Last Dose  . 0.9 %  sodium chloride infusion   Intravenous Once Rigoberto Noel, MD      . 0.9 %  sodium chloride infusion   Intravenous PRN Chesley Mires, MD      . acetaminophen (TYLENOL) solution 650 mg  650 mg Oral Q6H PRN Jake Church Masters, RPH   650 mg at 11/10/15 1700  . antiseptic oral rinse solution (CORINZ)  7 mL Mouth Rinse QID Rigoberto Noel, MD   7 mL at 11/12/15 1235  . ceftaroline (TEFLARO) 600 mg in sodium chloride 0.9 % 250 mL IVPB  600 mg Intravenous Q8H  Jose Shirl Harris, MD   600 mg at 11/12/15 1433  . chlorhexidine gluconate (SAGE KIT) (PERIDEX) 0.12 % solution 15 mL  15 mL Mouth Rinse BID Rigoberto Noel, MD   15 mL at 11/12/15 1235  . diazepam (VALIUM) tablet 2 mg  2 mg Oral Q8H Eugenie Filler, MD   2 mg at 11/12/15 1234  . feeding supplement (ENSURE ENLIVE) (ENSURE ENLIVE) liquid 237 mL  237 mL Oral BID BM Eugenie Filler, MD   237 mL at 11/12/15 1431  . feeding supplement (VITAL AF 1.2 CAL) liquid 1,000 mL  1,000 mL Per Tube Continuous [place], MD 80 mL/hr at 11/11/15 1905 1,000 mL at 11/11/15 1905  . [START ON 11/13/2015] furosemide (LASIX) tablet 40 mg  40 mg Oral Daily Eugenie Filler, MD   40 mg at 11/12/15  1234  . haloperidol lactate (HALDOL) injection 5 mg  5 mg Intravenous Q6H PRN Brand Males, MD   5 mg at 11/07/15 0225  . HYDROmorphone (DILAUDID) tablet 2 mg  2 mg Per Tube Q3H PRN Chesley Mires, MD   2 mg at 11/09/15 1152   Or  . HYDROmorphone (DILAUDID) injection 2 mg  2 mg Intravenous Q3H PRN Chesley Mires, MD   2 mg at 11/12/15 1302  . ibuprofen (ADVIL,MOTRIN) tablet 400 mg  400 mg Oral Q6H PRN Wilhelmina Mcardle, MD   400 mg at 11/05/15 1441  . LORazepam (ATIVAN) injection 2 mg  2 mg Intravenous Q4H PRN Chesley Mires, MD   2 mg at 11/12/15 0514  . methadone (DOLOPHINE) tablet 15 mg  15 mg Oral Q8H Eugenie Filler, MD   15 mg at 11/12/15 1431  . metoprolol (LOPRESSOR) injection 2.5-5 mg  2.5-5 mg Intravenous Q3H PRN Wilhelmina Mcardle, MD   5 mg at 10/30/15 0545  . pantoprazole sodium (PROTONIX) 40 mg/20 mL oral suspension 40 mg  40 mg Oral Q24H Eugenie Filler, MD      . Derrill Memo ON 11/13/2015] pneumococcal 23 valent vaccine (PNU-IMMUNE) injection 0.5 mL  0.5 mL Intramuscular Tomorrow-1000 Eugenie Filler, MD      . polyethylene glycol (MIRALAX / GLYCOLAX) packet 17 g  17 g Oral Daily Juanito Doom, MD   17 g at 11/12/15 1234  . QUEtiapine (SEROQUEL) tablet 75 mg  75 mg Oral Q12H Eugenie Filler, MD      . rifampin (RIFADIN) capsule 600 mg  600 mg Oral Daily Michel Bickers, MD   600 mg at 11/12/15 1431  . sennosides (SENOKOT) 8.8 MG/5ML syrup 5 mL  5 mL Oral BID Eugenie Filler, MD      . sodium chloride flush (NS) 0.9 % injection 10-40 mL  10-40 mL Intracatheter Q12H Jose Shirl Harris, MD   10 mL at 11/11/15 0944  . sodium chloride flush (NS) 0.9 % injection 10-40 mL  10-40 mL Intracatheter PRN [place], MD   20 mL at 11/12/15 0218     Discharge Medications: Please see discharge summary for a list of discharge medications.  Relevant Imaging Results:  Relevant Lab Results:   Additional Information SS#: [id number],  Needs IV 662m ceftaroline every 8 hours through  8/22 and PO rifampin once a day through 8/22  [place]

## 2015-11-12 NOTE — Progress Notes (Signed)
   Assessment / Plan: 6 Days Post-Op  S/P Procedure(s) (LRB): IRRIGATION AND DEBRIDEMENT HIP (Right) 11/06/15 IRRIGATION AND DEBRIDEMENT FOOT (Left) and (Right) Left Foot on 10/24/15 and Right Ankle 11/01/15  by Dr. Marcial Pacasimothy D. Eulah PontMurphy   Principal Problem:   MRSA bacteremia Active Problems:   Acute respiratory failure (HCC)   Abscess of left hand   Altered mental status   Encounter for central line placement   MRSA pneumonia (HCC)   Sternal osteomyelitis (HCC)   Acute pulmonary edema (HCC)   Septic arthritis of hip (HCC)   IV drug abuse   Septic arthritis of right ankle (HCC)   Sacral osteomyelitis (HCC)   Abscess of right hand   Abscess of left foot   Acute respiratory failure with hypoxemia (HCC)   Bacteremia   MRSA (methicillin resistant staph aureus) culture positive   Acute respiratory failure with hypercapnia (HCC)   Left Foot / Right Ankle Wounds Dry / looking good.  No purulence.  Sutures in place on the right ankle.  Plan to d/c sutures right ankle ~11/15/15. Dorsiflexion/Plantarflexion/EHL/FHL intact.  Distal sensation intact.    Right hip  Wound approximated with nylon suture.  Superficial slough.  Aquacel dressing w/ SS Drainage.  Plan to d/c Right Hip sutures ~7/25.  Weight Bearing: Weight Bearing as Tolerated (WBAT) B/L LE.   Dressings:   Right Ankle xeroform / gauze dressing.   Left foot wet to dry dressing daily.  Right Hip Aquacel Ag Dressing and gauze over drain site.  Mobilize w/ PT Continue ABX therapy VTE prophylaxis: per primary. Dispo: per primary  Subjective: In  bed on arrival.  Patient reports pain as mild b/l LE.   Moves all extremities independently.  Objective:   VITALS:   Filed Vitals:   11/11/15 2300 11/12/15 0402 11/12/15 0541 11/12/15 0619  BP:   103/53   Pulse: 95 107 105   Temp:   98.8 F (37.1 C)   TempSrc:   Oral   Resp: 18 20 16    Height:      Weight:    68.7 kg (151 lb 7.3 oz)  SpO2: 98% 98% 99%     Lucretia KernHenry Calvin  Martensen III 11/12/2015, 7:35 AM

## 2015-11-13 DIAGNOSIS — J9611 Chronic respiratory failure with hypoxia: Secondary | ICD-10-CM

## 2015-11-13 DIAGNOSIS — Z930 Tracheostomy status: Secondary | ICD-10-CM | POA: Insufficient documentation

## 2015-11-13 LAB — CBC
HEMATOCRIT: 25.8 % — AB (ref 39.0–52.0)
Hemoglobin: 8.3 g/dL — ABNORMAL LOW (ref 13.0–17.0)
MCH: 26.4 pg (ref 26.0–34.0)
MCHC: 32.2 g/dL (ref 30.0–36.0)
MCV: 82.2 fL (ref 78.0–100.0)
PLATELETS: 509 10*3/uL — AB (ref 150–400)
RBC: 3.14 MIL/uL — ABNORMAL LOW (ref 4.22–5.81)
RDW: 14.9 % (ref 11.5–15.5)
WBC: 11.6 10*3/uL — AB (ref 4.0–10.5)

## 2015-11-13 LAB — BASIC METABOLIC PANEL
Anion gap: 12 (ref 5–15)
BUN: 14 mg/dL (ref 6–20)
CO2: 26 mmol/L (ref 22–32)
CREATININE: 0.54 mg/dL — AB (ref 0.61–1.24)
Calcium: 9.2 mg/dL (ref 8.9–10.3)
Chloride: 93 mmol/L — ABNORMAL LOW (ref 101–111)
GFR calc Af Amer: >60 mL/min (ref >60)
GLUCOSE: 113 mg/dL — AB (ref 65–99)
Potassium: 4.1 mmol/L (ref 3.5–5.1)
SODIUM: 131 mmol/L — AB (ref 135–145)

## 2015-11-13 LAB — GLUCOSE, CAPILLARY
GLUCOSE-CAPILLARY: 106 mg/dL — AB (ref 65–99)
GLUCOSE-CAPILLARY: 115 mg/dL — AB (ref 65–99)
Glucose-Capillary: 118 mg/dL — ABNORMAL HIGH (ref 65–99)
Glucose-Capillary: 110 mg/dL — ABNORMAL HIGH (ref 65–99)

## 2015-11-13 LAB — MAGNESIUM: Magnesium: 2.1 mg/dL (ref 1.7–2.4)

## 2015-11-13 NOTE — Progress Notes (Signed)
Occupational Therapy Evaluation   11/13/15 1400  OT Visit Information  Last OT Received On 11/13/15  Assistance Needed +2 (necessary for chair follow)  History of Present Illness Pt adm with disseminated MRSA bacteremia. Pt with repeated treatment with surgical drainage of large abscesses including bil hands, bil ankles, sternum, and rt hip. Pt intubated 6/26. Self extubated and then reintubated. Pt now with trach and on trach collar. PMH - IV drug abuse,   Precautions  Precautions Fall  Pain Assessment  Pain Assessment Faces  Faces Pain Scale 8  Pain Location back  Pain Descriptors / Indicators Sore;Sharp  Pain Intervention(s) Limited activity within patient's tolerance;Monitored during session;Repositioned;Patient requesting pain meds-RN notified  Cognition  Arousal/Alertness Awake/alert  Behavior During Therapy WFL for tasks assessed/performed  Overall Cognitive Status Within Functional Limits for tasks assessed  ADL  Overall ADL's  Needs assistance/impaired  Grooming Wash/dry face;Set up;Bed level  Upper Body Bathing Maximal assistance;Bed level  Lower Body Bathing Maximal assistance;Bed level  Upper Body Dressing  Moderate assistance;Bed level  Lower Body Dressing Maximal assistance;Bed level  General ADL Comments Assist necessary due to severely limited hand strength and fine motor coordination  Bed Mobility  Overal bed mobility Needs Assistance  Bed Mobility Rolling  Rolling Min assist  General bed mobility comments Min assist for rolling side-side in bed due to decreased hand strength and fine motor coordination.   Restrictions  Weight Bearing Restrictions Yes  RLE Weight Bearing WBAT  LLE Weight Bearing WBAT  Vision- Assessment  Vision Assessment? No apparent visual deficits  Transfers  General transfer comment Not attempted as RN entered to take out stitches and complete dressing changes.  OT - End of Session  Activity Tolerance Patient tolerated treatment well   Patient left in bed;with call bell/phone within reach;with nursing/sitter in room (RN completing dressing change)  Nurse Communication Mobility status  OT Assessment/Plan  OT Frequency (ACUTE ONLY) Min 3X/week  Follow Up Recommendations SNF;Supervision/Assistance - 24 hour  OT Equipment Other (comment) (TBD at next venue)  Acute Rehab OT Goals  Patient Stated Goal to get a job and to possibly move in with his father in [place]  OT Goal Formulation With patient  Time For Goal Achievement 11/27/15  Potential to Achieve Goals Good  OT Time Calculation  OT Start Time (ACUTE ONLY) 1333  OT Stop Time (ACUTE ONLY) 1350  OT Time Calculation (min) 17 min  OT General Charges  $OT Visit 1 Procedure  OT Evaluation  $OT Eval Moderate Complexity 1 Procedure   Kevin Austin, OTR/L Pager: [phone] 11/13/2015

## 2015-11-13 NOTE — Progress Notes (Signed)
Physical Therapy Wound Treatment Patient Details  Name: Kevin Austin MRN: [id number] Date of Birth: 04/13/1991  Today's Date: 11/13/2015 Time: 1100-1135 Time Calculation (min): 35 min  Subjective  Subjective: Pt awake and alert during session. Does not endorse pain.  Patient and Family Stated Goals: None stated Date of Onset:  (Unknown) Prior Treatments: I&D 6/27  Pain Score: Pt does not report pain during session.   Wound Assessment  Wound / Incision (Open or Dehisced) 10/25/15 Incision - Open Hand Left Dorsal aspect (Active)  Dressing Type Compression wrap;Moist to dry;Gauze (Comment) 11/13/2015  1:00 PM  Dressing Changed Changed 11/13/2015  1:00 PM  Dressing Status Clean;Dry;Intact 11/13/2015  1:00 PM  Dressing Change Frequency Daily 11/13/2015  1:00 PM  Site / Wound Assessment Pink;Red 11/13/2015  1:00 PM  % Wound base Red or Granulating 100% 11/13/2015  1:00 PM  % Wound base Yellow 0% 11/13/2015  1:00 PM  % Wound base Black 0% 11/13/2015  1:00 PM  % Wound base Other (Comment) 0% 11/13/2015  1:00 PM  Peri-wound Assessment Intact 11/13/2015  1:00 PM  Wound Length (cm) 3.3 cm 11/03/2015 11:00 AM  Wound Width (cm) 1.5 cm 11/03/2015 11:00 AM  Wound Depth (cm) 1.5 cm 11/03/2015 11:00 AM  Undermining (cm) Undermining at 4:00 1.0cm and at 9:00 2.0cm. 11/03/2015 11:00 AM  Margins Unattached edges (unapproximated) 11/13/2015  1:00 PM  Closure None 11/13/2015  1:00 PM  Drainage Amount Minimal 11/13/2015  1:00 PM  Drainage Description Serosanguineous;Purulent 11/13/2015  1:00 PM  Non-staged Wound Description Not applicable 2/86/3817  7:11 PM  Treatment Hydrotherapy (Pulse lavage);Packing (Impregnated strip) 11/13/2015  1:00 PM     Wound / Incision (Open or Dehisced) 10/25/15 Incision - Open Hand Left Palmar aspect (Active)  Dressing Type Compression wrap;Gauze (Comment);Moist to dry 11/13/2015  1:00 PM  Dressing Changed Changed 11/13/2015  1:00 PM  Dressing Status Clean;Dry;Intact 11/13/2015  1:00 PM  Dressing  Change Frequency Daily 11/13/2015  1:00 PM  Site / Wound Assessment Pink;Red 11/13/2015  1:00 PM  % Wound base Red or Granulating 100% 11/13/2015  1:00 PM  % Wound base Yellow 0% 11/13/2015  1:00 PM  % Wound base Black 0% 11/13/2015  1:00 PM  % Wound base Other (Comment) 0% 11/13/2015  1:00 PM  Peri-wound Assessment Intact;Maceration 11/13/2015  1:00 PM  Wound Length (cm) 0.4 cm 11/03/2015 11:00 AM  Wound Width (cm) 3.5 cm 11/03/2015 11:00 AM  Wound Depth (cm) 1.8 cm 11/03/2015 11:00 AM  Undermining (cm) Undermining 4:00 - 7:00 2.0cm. 11/03/2015 11:00 AM  Margins Unattached edges (unapproximated) 11/13/2015  1:00 PM  Closure None 11/13/2015  1:00 PM  Drainage Amount Minimal 11/13/2015  1:00 PM  Drainage Description Serosanguineous;Purulent 11/13/2015  1:00 PM  Non-staged Wound Description Not applicable 6/57/9038  3:33 PM  Treatment Hydrotherapy (Pulse lavage);Packing (Impregnated strip) 11/13/2015  1:00 PM   Hydrotherapy Pulsed lavage therapy - wound location: L hand wounds Pulsed Lavage with Suction (psi): 12 psi Pulsed Lavage with Suction - Normal Saline Used: 1000 mL (Between all wounds) Pulsed Lavage Tip: Tip with splash shield   Wound Assessment and Plan  Wound Therapy - Assess/Plan/Recommendations Wound Therapy - Clinical Statement: Continued yellow purulent drainage on the L hand. R hand is appropriate for 3x/week as it remains 100% granulation.  Wound Therapy - Functional Problem List: Decreased strength/AROM of hands/fingers for ADL's and fine motor tasks.  Factors Delaying/Impairing Wound Healing: Substance abuse;Multiple medical problems Hydrotherapy Plan: Debridement;Dressing change;Patient/family education;Pulsatile lavage with suction Wound Therapy -  Frequency: 6X / week Wound Therapy - Follow Up Recommendations: Other (comment) (LTAC vs SNF) Wound Plan: See above  Wound Therapy Goals- Improve the function of patient's integumentary system by progressing the wound(s) through the phases  of wound healing (inflammation - proliferation - remodeling) by: Decrease Necrotic Tissue to: 0 Decrease Necrotic Tissue - Progress: Met Increase Granulation Tissue to: 100 Increase Granulation Tissue - Progress: Met Patient/Family will be able to : complete dressing changes independently prior to d/c Patient/Family Instruction Goal - Progress: Progressing toward goal Goals/treatment plan/discharge plan were made with and agreed upon by patient/family: No, Patient unable to participate in goals/treatment/discharge plan and family unavailable Time For Goal Achievement: 7 days Wound Therapy - Potential for Goals: Good  Goals will be updated until maximal potential achieved or discharge criteria met.  Discharge criteria: when goals achieved, discharge from hospital, MD decision/surgical intervention, no progress towards goals, refusal/missing three consecutive treatments without notification or medical reason.  GP     Rolinda Roan 11/13/2015, 1:10 PM   Rolinda Roan, PT, DPT Acute Rehabilitation Services Pager: [phone]

## 2015-11-13 NOTE — Progress Notes (Signed)
Patient remains capped with no complications. Patient is tolerating great. Will continue to monitor pt.

## 2015-11-13 NOTE — Progress Notes (Signed)
Patient remains capped with no complications. spo2 on RA is 99%, BS are clear and diminished. Will continue to monitor pt.

## 2015-11-13 NOTE — Progress Notes (Signed)
Occupational Therapy Treatment Patient Details Name: Kevin Austin MRN: [id number] DOB: [birth year] Today's Date: 11/13/2015    History of present illness Pt adm with disseminated MRSA bacteremia. Pt with repeated treatment with surgical drainage of large abscesses including bil hands, bil ankles, sternum, and rt hip. Pt intubated 6/26. Self extubated and then reintubated. Pt now with trach and on trach collar. PMH - IV drug abuse,    OT comments  Pt is motivated and continues to show good participation in therapy session. Provided and practiced use of red thera-foam for built-up handles on grooming tasks items and utensils for eating to maximize pt's independence with those tasks. Pt also completed basic transfers and ambulated 100' with 2 rest breaks - pt's HR increased to 150 during ambulation, SpO2 92% on RA (RN notified). Recommend SNF for post-acute rehab to continue therapy. Will continue to follow to strengthen pt's hand strength, fine motor coordination and to increased his independence with ADLs and basic transfers.   Follow Up Recommendations  SNF;Supervision/Assistance - 24 hour    Equipment Recommendations  Other (comment) (TBD at next venue)    Recommendations for Other Services      Precautions / Restrictions Precautions Precautions: Fall Restrictions Weight Bearing Restrictions: Yes RLE Weight Bearing: Weight bearing as tolerated LLE Weight Bearing: Weight bearing as tolerated       Mobility Bed Mobility Overal bed mobility: Needs Assistance Bed Mobility: Supine to Sit Rolling: Min assist   Supine to sit: Min guard     General bed mobility comments: Min guard assist for safety as pt has limited bilateral hand strength.  Transfers Overall transfer level: Needs assistance Equipment used: Rolling walker (2 wheeled) Transfers: Sit to/from Stand Sit to Stand: Min assist         General transfer comment: Min assist for stability upon standing. Pt ambulated 100'  with 1 rest break - HR increased to 150's and SpO2 92% on RA. Pt has narrow BoS when standing and ambulating and often takes small staggering steps due to bilateral ankle pain.    Balance Overall balance assessment: Needs assistance Sitting-balance support: No upper extremity supported;Feet supported Sitting balance-Leahy Scale: Good Sitting balance - Comments: limited hand strength and coordination to protect self when given challenge   Standing balance support: Bilateral upper extremity supported;During functional activity Standing balance-Leahy Scale: Poor Standing balance comment: Reliant on BUE support for balance, narrow BoS, staggering steps with static standing at times.                   ADL Overall ADL's : Needs assistance/impaired Eating/Feeding: Minimal assistance;With adaptive utensils;Cueing for compensatory techinques;Sitting Eating/Feeding Details (indicate cue type and reason): provided red therafoam for built-up utensils. Min assist to open containers and packages and setup provided Grooming: Oral care;Minimal assistance;Cueing for compensatory techniques;Sitting Grooming Details (indicate cue type and reason): provided red therafoam for built-up handle, assist to place toothbrush in therafoam Upper Body Bathing: Maximal assistance;Bed level   Lower Body Bathing: Maximal assistance;Bed level   Upper Body Dressing : Moderate assistance;Sitting   Lower Body Dressing: Maximal assistance;Sit to/from stand   Toilet Transfer: Minimal assistance;Cueing for safety;Ambulation;BSC;RW   Toileting- Clothing Manipulation and Hygiene: Maximal assistance;Sit to/from stand       Functional mobility during ADLs: Minimal assistance;Rolling walker General ADL Comments: Pt requires frequent rest breaks due to increased HR during activity, but requires cues to take them.      Vision  Perception     Praxis      Cognition   Behavior During  Therapy: WFL for tasks assessed/performed Overall Cognitive Status: Within Functional Limits for tasks assessed                       Extremity/Trunk Assessment  Upper Extremity Assessment Upper Extremity Assessment: RUE deficits/detail;LUE deficits/detail RUE Deficits / Details: very weak grip 2-/5, shoulder/elbow strength 4/5; AROM: limited digit flexion/extension and wrist extension, all others wfl RUE Coordination: decreased fine motor LUE Deficits / Details: very weak grip 2-/5, shoulder/elbow 4/5; AROM: limited finger flexion/extension (more movement than R hand)), limited wrist flexion/extension (splint); others wfl LUE Coordination: decreased fine motor            Exercises     Shoulder Instructions       General Comments      Pertinent Vitals/ Pain       Pain Assessment: Faces Faces Pain Scale: Hurts whole lot Pain Location: back Pain Descriptors / Indicators: Sore;Sharp Pain Intervention(s): Limited activity within patient's tolerance;Premedicated before session;Monitored during session;Repositioned  Home Living Family/patient expects to be discharged to:: Skilled nursing facility                                        Prior Functioning/Environment Level of Independence: Independent            Frequency Min 3X/week     Progress Toward Goals  OT Goals(current goals can now be found in the care plan section)  Progress towards OT goals: Progressing toward goals  Acute Rehab OT Goals Patient Stated Goal: to get a job and to possibly move in with his father in [place] OT Goal Formulation: With patient Time For Goal Achievement: 11/27/15 Potential to Achieve Goals: Good ADL Goals Pt Will Perform Eating: with set-up;sitting Pt Will Perform Grooming: with set-up;sitting Pt Will Transfer to Toilet: with supervision;bedside commode;ambulating (over toilet) Pt Will Perform Toileting - Clothing Manipulation and hygiene: with min  assist;sit to/from stand (assist for thorough pericare) Pt/caregiver will Perform Home Exercise Program: Increased ROM;Both right and left upper extremity;With theraputty;With written HEP provided;With Supervision (bilateral hands and wrists)  Plan Discharge plan remains appropriate    Co-evaluation                 End of Session Equipment Utilized During Treatment: Gait belt;Rolling walker   Activity Tolerance Patient tolerated treatment well   Patient Left in chair;with call bell/phone within reach;with chair alarm set;with family/visitor present   Nurse Communication Mobility status;Other (comment) (HR 150's during activity)        Time: [id number] OT Time Calculation (min): 25 min  Charges: OT General Charges $OT Visit: 1 Procedure OT Evaluation $OT Eval Moderate Complexity: 1 Procedure OT Treatments $Self Care/Home Management : 23-37 mins  Nils PyleJulia Simar Pothier, OTR/L Pager: [phone] 11/13/2015, 4:34 PM
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
## 2015-11-13 NOTE — Progress Notes (Signed)
Pt capped per Dr. Molli KnockYacoub. Pt is tolerating well at this time. Will continue to monitor pt progress. Pt's Sp02 is 97%, HR 112, 18 BS clear- diminished.
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
## 2015-11-13 NOTE — Progress Notes (Signed)
[facility] for Infectious Disease    Date of Admission:  10/22/2015   Total days of antibiotics 23 Day 8 of ceftaroline, rifampin  Principal Problem:   MRSA bacteremia Active Problems:   Acute respiratory failure (HCC)   Altered mental status   MRSA pneumonia ([place])   IV drug abuse   Abscess of left hand   Sternal osteomyelitis (HCC)   Septic arthritis of hip (HCC)   Septic arthritis of right ankle (HCC)   Sacral osteomyelitis (HCC)   Abscess of right hand   Abscess of left foot   Encounter for central line placement   Acute pulmonary edema (HCC)   Acute respiratory failure with hypoxemia (HCC)   Bacteremia   MRSA (methicillin resistant staph aureus) culture positive   Acute respiratory failure with hypercapnia ([place])   . sodium chloride   Intravenous Once  . antiseptic oral rinse  7 mL Mouth Rinse QID  . ceFTAROline (TEFLARO) IV  600 mg Intravenous Q8H  . chlorhexidine gluconate (SAGE KIT)  15 mL Mouth Rinse BID  . diazepam  2 mg Oral Q8H  . feeding supplement (ENSURE ENLIVE)  237 mL Oral BID BM  . furosemide  40 mg Oral Daily  . methadone  15 mg Oral Q8H  . nicotine  21 mg Transdermal Daily  . pantoprazole sodium  40 mg Oral Q24H  . polyethylene glycol  17 g Oral Daily  . QUEtiapine  75 mg Oral Q12H  . rifampin  600 mg Oral Daily  . sennosides  5 mL Oral BID  . sodium chloride flush  10-40 mL Intracatheter Q12H    SUBJECTIVE: Mr. Astorga is doing well today not complaining of anything in particular.  Review of Systems: Review of Systems  Constitutional: Negative for fever and chills.  Respiratory: Negative for shortness of breath.   Gastrointestinal: Negative for abdominal pain.  Musculoskeletal: Negative for myalgias.  Skin: Negative for rash.    History reviewed. No pertinent past medical history.  Social History  Substance Use Topics  . Smoking status:  Current Every Day Smoker -- 1.50 packs/day    Types: Cigarettes  . Smokeless tobacco: Never Used  . Alcohol Use: No    History reviewed. No pertinent family history. Allergies  Allergen Reactions  . Ketorolac Nausea Only    Per [facility] records  . Tramadol Nausea Only    Per [facility] records    OBJECTIVE: Filed Vitals:   11/13/15 0319 11/13/15 0455 11/13/15 0732 11/13/15 1144  BP:  115/66    Pulse: 108 108 111 110  Temp:  98.6 F (37 C)    TempSrc:  Oral    Resp: _0 Height:      Weight:      SpO2: 97% 94% 99% 96%   Body mass index is 20.52 kg/(m^2).  Physical Exam GENERAL- Alert and oriented HEENT- Tracheostomy tube CARDIAC- Tachycardic with a regular rhythm, no murmurs, sternal wound dressing  RESP- Bilateral crackles ABDOMEN- Soft, no guarding or rebound EXTREMITIES- wounds clean and intact, no pedal edema SKIN- Warm, dry, no rashes   Lab Results Lab Results  Component Value Date   WBC 11.6* 11/13/2015   HGB 8.3* 11/13/2015   HCT 25.8* 11/13/2015   MCV 82.2 11/13/2015   PLT 509* 11/13/2015    Lab Results  Component Value Date   CREATININE 0.54* 11/13/2015   BUN 14 11/13/2015   NA 131* 11/13/2015   K 4.1  11/13/2015   CL 93* 11/13/2015   CO2 26 11/13/2015    Lab Results  Component Value Date   ALT 31 11/06/2015   AST 55* 11/06/2015   ALKPHOS 128* 11/06/2015   BILITOT 1.6* 11/06/2015     Microbiology: Recent Results (from the past 240 hour(s))  Culture, blood (routine x 2)     Status: Abnormal   Collection Time: 11/05/15  3:20 PM  Result Value Ref Range Status   Specimen Description BLOOD LEFT ANTECUBITAL  Final   Special Requests IN PEDIATRIC BOTTLE  3CC  Final   Culture  Setup Time   Final    GRAM POSITIVE COCCI IN CLUSTERS AEROBIC BOTTLE ONLY CRITICAL RESULT CALLED TO, READ BACK BY AND VERIFIED WITH: E MARTIN,PHARMD AT 1207 11/06/15 BY L BENFIELD    Culture (A)  Final    STAPHYLOCOCCUS SPECIES (COAGULASE NEGATIVE) THE  SIGNIFICANCE OF ISOLATING THIS ORGANISM FROM A SINGLE SET OF BLOOD CULTURES WHEN MULTIPLE SETS ARE DRAWN IS UNCERTAIN. PLEASE NOTIFY THE MICROBIOLOGY DEPARTMENT WITHIN ONE WEEK IF SPECIATION AND SENSITIVITIES ARE REQUIRED.    Report Status 11/08/2015 FINAL  Final  Culture, respiratory (NON-Expectorated)     Status: None   Collection Time: 11/05/15  3:29 PM  Result Value Ref Range Status   Specimen Description TRACHEAL ASPIRATE  Final   Special Requests NONE  Final   Gram Stain   Final    ABUNDANT WBC PRESENT,BOTH PMN AND MONONUCLEAR ABUNDANT GRAM POSITIVE COCCI IN PAIRS FEW GRAM NEGATIVE RODS    Culture   Final    ABUNDANT METHICILLIN RESISTANT STAPHYLOCOCCUS AUREUS   Report Status 11/07/2015 FINAL  Final   Organism ID, Bacteria METHICILLIN RESISTANT STAPHYLOCOCCUS AUREUS  Final      Susceptibility   Methicillin resistant staphylococcus aureus - MIC*    CIPROFLOXACIN <=0.5 SENSITIVE Sensitive     ERYTHROMYCIN >=8 RESISTANT Resistant     GENTAMICIN <=0.5 SENSITIVE Sensitive     OXACILLIN >=4 RESISTANT Resistant     TETRACYCLINE <=1 SENSITIVE Sensitive     VANCOMYCIN 1 SENSITIVE Sensitive     TRIMETH/SULFA <=10 SENSITIVE Sensitive     CLINDAMYCIN <=0.25 SENSITIVE Sensitive     RIFAMPIN <=0.5 SENSITIVE Sensitive     Inducible Clindamycin NEGATIVE Sensitive     * ABUNDANT METHICILLIN RESISTANT STAPHYLOCOCCUS AUREUS  Culture, blood (routine x 2)     Status: None   Collection Time: 11/05/15  3:30 PM  Result Value Ref Range Status   Specimen Description BLOOD LEFT ANTECUBITAL  Final   Special Requests IN PEDIATRIC BOTTLE  3CC  Final   Culture NO GROWTH 5 DAYS  Final   Report Status 11/10/2015 FINAL  Final  Urine culture     Status: None   Collection Time: 11/05/15  5:53 PM  Result Value Ref Range Status   Specimen Description URINE, CATHETERIZED  Final   Special Requests PATIENT ON FOLLOWING [facility]  Final   Culture NO GROWTH  Final   Report Status 11/06/2015 FINAL  Final    Aerobic/Anaerobic Culture (surgical/deep wound)     Status: None   Collection Time: 11/06/15  1:53 PM  Result Value Ref Range Status   Specimen Description TISSUE RIGHT HIP  Final   Special Requests PT ON ANCEF  Final   Gram Stain   Final    ABUNDANT WBC PRESENT,BOTH PMN AND MONONUCLEAR NO ORGANISMS SEEN    Culture No growth aerobically or anaerobically.  Final   Report Status 11/11/2015 FINAL  Final  ASSESSMENT: Disseminated MRSA bacteremia: He is continuing to do very well. He will probably need a PICC since evaluation is for SNF placemen but with IVDU history there is a safety risk associated. He might be able to discontinue rifampin and continue only ceftaroline after discharge from here which would be helpful to eliminate a drug interaction with his methadone. He has no prosthetic implant or other obvious indication needing it for the full duration.  PLAN: 1. Continue ceftaroline through at least 8/22 then probably a month of bactrim or doxycycline by mouth  Collier Salina, MD PGY-II Internal Medicine Resident Pager# [phone] 11/13/2015, 2:13 PM

## 2015-11-14 LAB — CBC
HCT: 25.1 % — ABNORMAL LOW (ref 39.0–52.0)
Hemoglobin: 8.2 g/dL — ABNORMAL LOW (ref 13.0–17.0)
MCH: 26.7 pg (ref 26.0–34.0)
MCHC: 32.7 g/dL (ref 30.0–36.0)
MCV: 81.8 fL (ref 78.0–100.0)
PLATELETS: 429 10*3/uL — AB (ref 150–400)
RBC: 3.07 MIL/uL — ABNORMAL LOW (ref 4.22–5.81)
RDW: 14.9 % (ref 11.5–15.5)
WBC: 10.3 10*3/uL (ref 4.0–10.5)

## 2015-11-14 LAB — BASIC METABOLIC PANEL
Anion gap: 9 (ref 5–15)
BUN: 14 mg/dL (ref 6–20)
CALCIUM: 8.8 mg/dL — AB (ref 8.9–10.3)
CO2: 26 mmol/L (ref 22–32)
CREATININE: 0.66 mg/dL (ref 0.61–1.24)
Chloride: 95 mmol/L — ABNORMAL LOW (ref 101–111)
GFR calc non Af Amer: >60 mL/min (ref >60)
GLUCOSE: 109 mg/dL — AB (ref 65–99)
Potassium: 4.1 mmol/L (ref 3.5–5.1)
Sodium: 130 mmol/L — ABNORMAL LOW (ref 135–145)

## 2015-11-14 MED ORDER — SENNA 8.6 MG PO TABS
1.0000 | ORAL_TABLET | Freq: Two times a day (BID) | ORAL | Status: DC
  Administered 2015-11-14 – 2015-11-22 (×17): 8.6 mg via ORAL
  Filled 2015-11-14 (×17): qty 1

## 2015-11-14 MED ORDER — PANTOPRAZOLE SODIUM 40 MG PO TBEC
40.0000 mg | DELAYED_RELEASE_TABLET | Freq: Every day | ORAL | Status: DC
  Administered 2015-11-14 – 2015-12-18 (×31): 40 mg via ORAL
  Filled 2015-11-14 (×34): qty 1

## 2015-11-14 MED ORDER — CETYLPYRIDINIUM CHLORIDE 0.05 % MT LIQD
7.0000 mL | Freq: Two times a day (BID) | OROMUCOSAL | Status: DC
  Administered 2015-11-15 – 2015-12-18 (×43): 7 mL via OROMUCOSAL

## 2015-11-14 NOTE — Progress Notes (Signed)
      [address]       [place] [id number]             [phone]      8 Days Post-Op Procedure(s) (LRB): IRRIGATION AND DEBRIDEMENT HIP (Right)   Subjective:  No new complaints.  Tolerating trach being capped.  Objective: Vital signs in last 24 hours: Temp:  [98 F (36.7 C)-98.4 F (36.9 C)] 98 F (36.7 C) (07/19 91470633) Pulse Rate:  [105-120] 108 (07/19 [id number]) Cardiac Rhythm:  [-] Sinus tachycardia (07/19 0702) Resp:  [16-19] 18 (07/19 0633) BP: (126-134)/(77-87) 133/83 mmHg (07/19 0633) SpO2:  [96 %-99 %] 99 % (07/19 0834) FiO2 (%):  [21 %] 21 % (07/19 0834) Weight:  [154 lb 12.2 oz (70.2 kg)] 154 lb 12.2 oz (70.2 kg) (07/19 [id number])  Intake/Output from previous day: 07/18 0701 - 07/19 0700 In: 480 [P.O.:480] Out: 400 [Urine:400]  General appearance: alert, cooperative and no distress Heart: regular rate and rhythm Lungs: clear to auscultation bilaterally Wound: +granulating tissue, no puruelence noted  Lab Results:  Recent Labs  11/13/15 0430 11/14/15 0415  WBC 11.6* 10.3  HGB 8.3* 8.2*  HCT 25.8* 25.1*  PLT 509* 429*   BMET:  Recent Labs  11/13/15 0430 11/14/15 0415  NA 131* 130*  K 4.1 4.1  CL 93* 95*  CO2 26 26  GLUCOSE 113* 109*  BUN 14 14  CREATININE 0.54* 0.66  CALCIUM 9.2 8.8*    PT/INR: No results for input(s): LABPROT, INR in the last 72 hours. ABG    Component Value Date/Time   PHART 7.446 10/29/2015 0823   HCO3 37.7* 10/29/2015 0823   TCO2 39 10/29/2015 0823   ACIDBASEDEF 1.0 10/22/2015 1832   O2SAT 100.0 10/29/2015 0823   CBG (last 3)   Recent Labs  11/13/15 0409 11/13/15 0803 11/13/15 1144  GLUCAP 118* 110* 106*    Assessment/Plan: S/P Procedure(s) (LRB): IRRIGATION AND DEBRIDEMENT HIP (Right)  1. Sternal Abscess-resolving, wound is granulating, no purulence present.  Will get Wound care consult for possible wound vac placement 2. Care per primary   LOS: 23 days    Kevin Austin,  Kevin Austin 11/14/2015
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
## 2015-11-14 NOTE — Progress Notes (Signed)
Physical Therapy Wound Treatment Patient Details  Name: Kevin Austin MRN: [id number] Date of Birth: [birth year]  Today's Date: 11/14/2015 Time: 0865-7846 Time Calculation (min): 35 min  Subjective  Subjective: Pt awake and alert during session. Does not endorse pain.  Patient and Family Stated Goals: None stated Date of Onset:  (Unknown) Prior Treatments: I&D 6/27  Pain Score: Pt complains of pain during session in chest wound area, however reports no pain in hands during hydro treatment.  Wound Assessment  Wound / Incision (Open or Dehisced) 10/25/15 Incision - Open Hand Right Dorsal aspect - Ulnar side (Active)  Dressing Type Gauze (Comment);Moist to dry 11/14/2015  2:00 PM  Dressing Changed Changed 11/14/2015  2:00 PM  Dressing Status Clean;Dry;Intact 11/14/2015  2:00 PM  Dressing Change Frequency Daily 11/14/2015  2:00 PM  Site / Wound Assessment Red;Bleeding 11/14/2015  2:00 PM  % Wound base Red or Granulating 100% 11/14/2015  2:00 PM  % Wound base Yellow 0% 11/14/2015  2:00 PM  % Wound base Black 0% 11/14/2015  2:00 PM  % Wound base Other (Comment) 0% 11/14/2015  2:00 PM  Peri-wound Assessment Intact 11/14/2015  2:00 PM  Wound Length (cm) 5.5 cm 11/03/2015 11:00 AM  Wound Width (cm) 2.7 cm 11/03/2015 11:00 AM  Wound Depth (cm) 0.5 cm 11/03/2015 11:00 AM  Tunneling (cm) 0 11/13/2015  1:00 PM  Undermining (cm) 0 11/13/2015  1:00 PM  Margins Unattached edges (unapproximated) 11/14/2015  2:00 PM  Closure None 11/14/2015  2:00 PM  Drainage Amount Minimal 11/14/2015  2:00 PM  Drainage Description Sanguineous 11/14/2015  2:00 PM  Non-staged Wound Description Not applicable 9/62/9528  4:13 PM  Treatment Hydrotherapy (Pulse lavage);Packing (Saline gauze) 11/14/2015  2:00 PM     Wound / Incision (Open or Dehisced) 10/25/15 Incision - Open Hand Right Dorsal aspect - Radial side (Active)  Dressing Type Compression wrap;Gauze (Comment);Moist to dry;Moisture barrier 11/14/2015  2:00 PM  Dressing Changed Changed  11/14/2015  2:00 PM  Dressing Status Clean;Dry;Intact 11/14/2015  2:00 PM  Dressing Change Frequency Daily 11/14/2015  2:00 PM  Site / Wound Assessment Red;Bleeding 11/14/2015  2:00 PM  % Wound base Red or Granulating 100% 11/14/2015  2:00 PM  % Wound base Yellow 0% 11/14/2015  2:00 PM  % Wound base Black 0% 11/14/2015  2:00 PM  % Wound base Other (Comment) 0% 11/14/2015  2:00 PM  Peri-wound Assessment Intact 11/14/2015  2:00 PM  Wound Length (cm) 4.3 cm 11/03/2015 11:00 AM  Wound Width (cm) 1.5 cm 11/03/2015 11:00 AM  Wound Depth (cm) 0.5 cm 11/03/2015 11:00 AM  Undermining (cm) Undermining at 9:00 to a depth of 1.0.   11/03/2015 11:00 AM  Margins Unattached edges (unapproximated) 11/14/2015  2:00 PM  Closure None 11/14/2015  2:00 PM  Drainage Amount Minimal 11/14/2015  2:00 PM  Drainage Description Sanguineous 11/14/2015  2:00 PM  Non-staged Wound Description Not applicable 2/44/0102  7:25 PM  Treatment Hydrotherapy (Pulse lavage);Packing (Saline gauze) 11/14/2015  2:00 PM     Wound / Incision (Open or Dehisced) 10/25/15 Incision - Open Hand Left Dorsal aspect (Active)  Dressing Type Compression wrap;Moist to dry;Gauze (Comment) 11/14/2015  2:00 PM  Dressing Changed Changed 11/14/2015  2:00 PM  Dressing Status Clean;Dry;Intact 11/14/2015  2:00 PM  Dressing Change Frequency Daily 11/14/2015  2:00 PM  Site / Wound Assessment Pink;Yellow 11/14/2015  2:00 PM  % Wound base Red or Granulating 95% 11/14/2015  2:00 PM  % Wound base Yellow 5% 11/14/2015  2:00  PM  % Wound base Black 0% 11/14/2015  2:00 PM  % Wound base Other (Comment) 0% 11/14/2015  2:00 PM  Peri-wound Assessment Intact 11/14/2015  2:00 PM  Wound Length (cm) 3.3 cm 11/03/2015 11:00 AM  Wound Width (cm) 1.5 cm 11/03/2015 11:00 AM  Wound Depth (cm) 1.5 cm 11/03/2015 11:00 AM  Undermining (cm) Undermining at 4:00 1.0cm and at 9:00 2.0cm. 11/03/2015 11:00 AM  Margins Unattached edges (unapproximated) 11/14/2015  2:00 PM  Closure None 11/14/2015  2:00 PM  Drainage  Amount Minimal 11/14/2015  2:00 PM  Drainage Description Serosanguineous;Purulent 11/14/2015  2:00 PM  Non-staged Wound Description Not applicable 4/00/8676  1:95 PM  Treatment Hydrotherapy (Pulse lavage);Packing (Impregnated strip) 11/14/2015  2:00 PM     Wound / Incision (Open or Dehisced) 10/25/15 Incision - Open Hand Left Palmar aspect (Active)  Dressing Type Compression wrap;Gauze (Comment);Moist to dry 11/14/2015  2:00 PM  Dressing Changed Changed 11/14/2015  2:00 PM  Dressing Status Clean;Dry;Intact 11/14/2015  2:00 PM  Dressing Change Frequency Daily 11/14/2015  2:00 PM  Site / Wound Assessment Pink;Yellow 11/14/2015  2:00 PM  % Wound base Red or Granulating 95% 11/14/2015  2:00 PM  % Wound base Yellow 5% 11/14/2015  2:00 PM  % Wound base Black 0% 11/14/2015  2:00 PM  % Wound base Other (Comment) 0% 11/14/2015  2:00 PM  Peri-wound Assessment Intact;Maceration 11/14/2015  2:00 PM  Wound Length (cm) 0.4 cm 11/03/2015 11:00 AM  Wound Width (cm) 3.5 cm 11/03/2015 11:00 AM  Wound Depth (cm) 1.8 cm 11/03/2015 11:00 AM  Undermining (cm) Undermining 4:00 - 7:00 2.0cm. 11/03/2015 11:00 AM  Margins Unattached edges (unapproximated) 11/14/2015  2:00 PM  Closure None 11/14/2015  2:00 PM  Drainage Amount Minimal 11/14/2015  2:00 PM  Drainage Description Serosanguineous;Purulent 11/14/2015  2:00 PM  Non-staged Wound Description Not applicable 0/93/2671  2:45 PM  Treatment Hydrotherapy (Pulse lavage);Packing (Impregnated strip) 11/14/2015  2:00 PM   Hydrotherapy Pulsed lavage therapy - wound location: Bilateral hand wounds Pulsed Lavage with Suction (psi): 12 psi Pulsed Lavage with Suction - Normal Saline Used: 1000 mL (Between all wounds) Pulsed Lavage Tip: Tip with splash shield   Wound Assessment and Plan  Wound Therapy - Assess/Plan/Recommendations Wound Therapy - Clinical Statement: Continued yellow purulent drainage on the L hand. R hand is appropriate for 3x/week as it remains 100% granulation.  Wound  Therapy - Functional Problem List: Decreased strength/AROM of hands/fingers for ADL's and fine motor tasks.  Factors Delaying/Impairing Wound Healing: Substance abuse;Multiple medical problems Hydrotherapy Plan: Debridement;Dressing change;Patient/family education;Pulsatile lavage with suction Wound Therapy - Frequency: 6X / week Wound Therapy - Follow Up Recommendations: Other (comment) (CIR vs. SNF - will defer to PT recommendations) Wound Plan: See above  Wound Therapy Goals- Improve the function of patient's integumentary system by progressing the wound(s) through the phases of wound healing (inflammation - proliferation - remodeling) by: Decrease Necrotic Tissue to: 0 Decrease Necrotic Tissue - Progress: Progressing toward goal Increase Granulation Tissue to: 100 Increase Granulation Tissue - Progress: Progressing toward goal Patient/Family will be able to : complete dressing changes independently prior to d/c Patient/Family Instruction Goal - Progress: Progressing toward goal Goals/treatment plan/discharge plan were made with and agreed upon by patient/family: Yes Time For Goal Achievement: 7 days Wound Therapy - Potential for Goals: Good  Goals will be updated until maximal potential achieved or discharge criteria met.  Discharge criteria: when goals achieved, discharge from hospital, MD decision/surgical intervention, no progress towards goals, refusal/missing three  consecutive treatments without notification or medical reason.  GP     Rolinda Roan 11/14/2015, 2:25 PM   Rolinda Roan, PT, DPT Acute Rehabilitation Services Pager: [phone]

## 2015-11-14 NOTE — Progress Notes (Signed)
[facility] for Infectious Disease    Date of Admission:  10/22/2015   Total days of antibiotics 24 Day 9 of ceftaroline, rifampin  Principal Problem:   MRSA bacteremia Active Problems:   Acute respiratory failure (HCC)   Altered mental status   MRSA pneumonia ([place])   IV drug abuse   Abscess of left hand   Sternal osteomyelitis (HCC)   Septic arthritis of hip (HCC)   Septic arthritis of right ankle (HCC)   Sacral osteomyelitis (HCC)   Abscess of right hand   Abscess of left foot   Encounter for central line placement   Acute pulmonary edema (HCC)   Acute respiratory failure with hypoxemia (HCC)   Bacteremia   MRSA (methicillin resistant staph aureus) culture positive   Acute respiratory failure with hypercapnia (HCC)   Chronic respiratory failure with hypoxia ([place])   Tracheostomy status ([place])   . sodium chloride   Intravenous Once  . antiseptic oral rinse  7 mL Mouth Rinse BID  . ceFTAROline (TEFLARO) IV  600 mg Intravenous Q8H  . diazepam  2 mg Oral Q8H  . feeding supplement (ENSURE ENLIVE)  237 mL Oral BID BM  . furosemide  40 mg Oral Daily  . methadone  15 mg Oral Q8H  . nicotine  21 mg Transdermal Daily  . pantoprazole  40 mg Oral Daily  . polyethylene glycol  17 g Oral Daily  . QUEtiapine  75 mg Oral Q12H  . rifampin  600 mg Oral Daily  . senna  1 tablet Oral BID  . sodium chloride flush  10-40 mL Intracatheter Q12H    SUBJECTIVE: Kevin Austin did well overnight with trach capped. He is tolerating some regular food this morning and feels weak but well overall.  Review of Systems: Review of Systems  Constitutional: Negative for fever and chills.  Respiratory: Negative for shortness of breath.   Gastrointestinal: Negative for abdominal pain.  Musculoskeletal: Negative for myalgias.  Skin: Negative for rash.    History reviewed. No pertinent past medical  history.  Social History  Substance Use Topics  . Smoking status: Current Every Day Smoker -- 1.50 packs/day    Types: Cigarettes  . Smokeless tobacco: Never Used  . Alcohol Use: No    History reviewed. No pertinent family history. Allergies  Allergen Reactions  . Ketorolac Nausea Only    Per [facility] records  . Tramadol Nausea Only    Per [facility] records    OBJECTIVE: Filed Vitals:   11/14/15 0510 11/14/15 2694 11/14/15 0834 11/14/15 1133  BP:  133/83    Pulse: 111 108    Temp:  98 F (36.7 C)    TempSrc:  Oral    Resp: 18 18    Height:  6' (1.829 m)    Weight:  70.2 kg (154 lb 12.2 oz)    SpO2: 99% 97% 99% 99%   Body mass index is 20.99 kg/(m^2).  Physical Exam GENERAL- Alert and oriented HEENT- Tracheostomy tube capped CARDIAC- Tachycardic with a regular rhythm, no murmur, sternal wound dressing  RESP- Bilateral crackles ABDOMEN- Soft, no guarding or rebound EXTREMITIES- wounds clean and intact, no pedal edema, excoriations healed on legs SKIN- Warm, dry, no rashes   Lab Results Lab Results  Component Value Date   WBC 10.3 11/14/2015   HGB 8.2* 11/14/2015   HCT 25.1* 11/14/2015   MCV 81.8 11/14/2015   PLT 429* 11/14/2015    Lab Results  Component Value Date   CREATININE 0.66 11/14/2015   BUN 14 11/14/2015   NA 130* 11/14/2015   K 4.1 11/14/2015   CL 95* 11/14/2015   CO2 26 11/14/2015    Lab Results  Component Value Date   ALT 31 11/06/2015   AST 55* 11/06/2015   ALKPHOS 128* 11/06/2015   BILITOT 1.6* 11/06/2015     Microbiology: Recent Results (from the past 240 hour(s))  Culture, blood (routine x 2)     Status: Abnormal   Collection Time: 11/05/15  3:20 PM  Result Value Ref Range Status   Specimen Description BLOOD LEFT ANTECUBITAL  Final   Special Requests IN PEDIATRIC BOTTLE  3CC  Final   Culture  Setup Time   Final    GRAM POSITIVE COCCI IN CLUSTERS AEROBIC BOTTLE ONLY CRITICAL RESULT CALLED TO, READ BACK BY AND VERIFIED WITH: E  MARTIN,PHARMD AT 1207 11/06/15 BY L BENFIELD    Culture (A)  Final    STAPHYLOCOCCUS SPECIES (COAGULASE NEGATIVE) THE SIGNIFICANCE OF ISOLATING THIS ORGANISM FROM A SINGLE SET OF BLOOD CULTURES WHEN MULTIPLE SETS ARE DRAWN IS UNCERTAIN. PLEASE NOTIFY THE MICROBIOLOGY DEPARTMENT WITHIN ONE WEEK IF SPECIATION AND SENSITIVITIES ARE REQUIRED.    Report Status 11/08/2015 FINAL  Final  Culture, respiratory (NON-Expectorated)     Status: None   Collection Time: 11/05/15  3:29 PM  Result Value Ref Range Status   Specimen Description TRACHEAL ASPIRATE  Final   Special Requests NONE  Final   Gram Stain   Final    ABUNDANT WBC PRESENT,BOTH PMN AND MONONUCLEAR ABUNDANT GRAM POSITIVE COCCI IN PAIRS FEW GRAM NEGATIVE RODS    Culture   Final    ABUNDANT METHICILLIN RESISTANT STAPHYLOCOCCUS AUREUS   Report Status 11/07/2015 FINAL  Final   Organism ID, Bacteria METHICILLIN RESISTANT STAPHYLOCOCCUS AUREUS  Final      Susceptibility   Methicillin resistant staphylococcus aureus - MIC*    CIPROFLOXACIN <=0.5 SENSITIVE Sensitive     ERYTHROMYCIN >=8 RESISTANT Resistant     GENTAMICIN <=0.5 SENSITIVE Sensitive     OXACILLIN >=4 RESISTANT Resistant     TETRACYCLINE <=1 SENSITIVE Sensitive     VANCOMYCIN 1 SENSITIVE Sensitive     TRIMETH/SULFA <=10 SENSITIVE Sensitive     CLINDAMYCIN <=0.25 SENSITIVE Sensitive     RIFAMPIN <=0.5 SENSITIVE Sensitive     Inducible Clindamycin NEGATIVE Sensitive     * ABUNDANT METHICILLIN RESISTANT STAPHYLOCOCCUS AUREUS  Culture, blood (routine x 2)     Status: None   Collection Time: 11/05/15  3:30 PM  Result Value Ref Range Status   Specimen Description BLOOD LEFT ANTECUBITAL  Final   Special Requests IN PEDIATRIC BOTTLE  3CC  Final   Culture NO GROWTH 5 DAYS  Final   Report Status 11/10/2015 FINAL  Final  Urine culture     Status: None   Collection Time: 11/05/15  5:53 PM  Result Value Ref Range Status   Specimen Description URINE, CATHETERIZED  Final   Special  Requests PATIENT ON FOLLOWING [facility]  Final   Culture NO GROWTH  Final   Report Status 11/06/2015 FINAL  Final  Aerobic/Anaerobic Culture (surgical/deep wound)     Status: None   Collection Time: 11/06/15  1:53 PM  Result Value Ref Range Status   Specimen Description TISSUE RIGHT HIP  Final   Special Requests PT ON ANCEF  Final   Gram Stain   Final    ABUNDANT WBC PRESENT,BOTH PMN AND MONONUCLEAR NO ORGANISMS SEEN  Culture No growth aerobically or anaerobically.  Final   Report Status 11/11/2015 FINAL  Final     ASSESSMENT: Disseminated MRSA bacteremia: Kevin Austin is greatly improved and feeling well now except for severe deconditioning form his critical illness. He has a right PICC in place and will likely be discharged to SNF for rehab and medications. He will need prolonged treatment with IV antibiotics for 6 weeks and would maintain a suppressive treatment on oral doxycycline for a month based on severe disseminated illness with numerous abscesses and osteomyelitis that is being treated medically.  PLAN: 1. Continue ceftaroline through 8/22 with repeat ESR/CRP before discontinuing 2. Weekly lab monitoring including CBC, BMP 3. Consolidative therapy with 1 month doxycycline by mouth after finishing IV 4. Can discontinue rifampin at discharge  Collier Salina, MD PGY-II Internal Medicine Resident Pager# [phone] 11/14/2015, 11:57 AM

## 2015-11-14 NOTE — Progress Notes (Signed)
PT Cancellation Note  Patient Details Name: Jolene Schimkerey Arias MRN: [id number] DOB: 07/10/1990   Cancelled Treatment:    Reason Eval/Treat Not Completed: Patient declined, no reason specified Pt declined due to wanting to wait until he gets his pain medicine. His next dose is due at noon. Will follow up as time allows.   Blake DivineShauna A Danner Paulding 11/14/2015, 12:05 PM Mylo RedShauna Nava Song, PT, DPT [phone]

## 2015-11-14 NOTE — Progress Notes (Signed)
Patient remains capped with no complications. spo2 is 99%, bs are clear and diminished. No distress noted. Will continue to monitor pt.

## 2015-11-14 NOTE — Consult Note (Addendum)
WOC Nurse wound consult note BLE and hip wounds are followed by ortho service for assessment and plan of care, left hand is followed by hand surgery team.  Reason for Consult: Consult requested for sternal wound Vac placement; pt is followed by the CT service for assessment and plan of care. Wound type: Full thickness post-op wound to midline sternum Measurement: 8X5.5X1cm Wound bed: 10% yellow, 90% beefy red, exposed bone visible Drainage (amount, consistency, odor) small amt yellow drainage, some bubbling visible from inner wound bed Periwound: intact skin surrounding Dressing procedure/placement/frequency: Patient could eventually benefit from plastic surgery consult to discuss wound closure options. Applied Mepitel contact layer to protect the inner wound bed and one piece black foam.  Pt tolerated with mod amt discomfort. Plan for bedside nurse to change Q M/W/F  Please re-consult if further assistance is needed.  Thank-you,  Cammie Mcgeeawn Eusevio Schriver MSN, RN, CWOCN, [place]-AP, CNS [phone]

## 2015-11-14 NOTE — Progress Notes (Signed)
Trach removed w/out issue Occlusive dressing placed.  Pt tolerated well.   Simonne MartinetPeter E Zo Loudon ACNP-BC [facility] Pager # [phone] OR # [phone] if no answer

## 2015-11-15 LAB — CBC
HCT: 23.4 % — ABNORMAL LOW (ref 39.0–52.0)
HEMOGLOBIN: 7.7 g/dL — AB (ref 13.0–17.0)
MCH: 26.6 pg (ref 26.0–34.0)
MCHC: 32.9 g/dL (ref 30.0–36.0)
MCV: 81 fL (ref 78.0–100.0)
PLATELETS: 377 10*3/uL (ref 150–400)
RBC: 2.89 MIL/uL — AB (ref 4.22–5.81)
RDW: 14.3 % (ref 11.5–15.5)
WBC: 9.3 10*3/uL (ref 4.0–10.5)

## 2015-11-15 LAB — BASIC METABOLIC PANEL
ANION GAP: 7 (ref 5–15)
BUN: 14 mg/dL (ref 6–20)
CALCIUM: 8.6 mg/dL — AB (ref 8.9–10.3)
CO2: 27 mmol/L (ref 22–32)
CREATININE: 0.6 mg/dL — AB (ref 0.61–1.24)
Chloride: 95 mmol/L — ABNORMAL LOW (ref 101–111)
Glucose, Bld: 108 mg/dL — ABNORMAL HIGH (ref 65–99)
Potassium: 4.1 mmol/L (ref 3.5–5.1)
SODIUM: 129 mmol/L — AB (ref 135–145)

## 2015-11-15 MED ORDER — METHADONE HCL 10 MG PO TABS
10.0000 mg | ORAL_TABLET | Freq: Three times a day (TID) | ORAL | Status: DC
  Administered 2015-11-15 – 2015-11-16 (×3): 10 mg via ORAL
  Filled 2015-11-15 (×3): qty 1

## 2015-11-15 MED ORDER — HYDROMORPHONE HCL 1 MG/ML IJ SOLN
2.0000 mg | Freq: Four times a day (QID) | INTRAMUSCULAR | Status: DC | PRN
  Administered 2015-11-15 – 2015-11-16 (×2): 2 mg via INTRAVENOUS
  Filled 2015-11-15 (×2): qty 2

## 2015-11-15 MED ORDER — HYDROMORPHONE HCL 1 MG/ML IJ SOLN
2.0000 mg | Freq: Once | INTRAMUSCULAR | Status: AC
  Administered 2015-11-15: 2 mg via INTRAVENOUS
  Filled 2015-11-15: qty 2

## 2015-11-15 MED ORDER — HYDROMORPHONE HCL 2 MG PO TABS
2.0000 mg | ORAL_TABLET | ORAL | Status: DC | PRN
  Administered 2015-11-15 – 2015-11-17 (×9): 2 mg
  Filled 2015-11-15 (×10): qty 1
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
## 2015-11-15 NOTE — Progress Notes (Signed)
Occupational Therapy Treatment Patient Details Name: Kevin Austin MRN: [id number] DOB: October 02, 1990 Today's Date: 11/15/2015    History of present illness Pt adm with disseminated MRSA bacteremia. Pt with repeated treatment with surgical drainage of large abscesses including bil hands, bil ankles, sternum, and rt hip. Pt intubated 6/26. Self extubated and then reintubated. Trach decannulated and sternal wound vac placed 11/14/15. PMH - IV drug abuse.    OT comments  Pt making slow, but steady progress towards OT goals. Session focus on HEP for improving hand strength and AROM - pt completed all exercises and pt's mother verbalized understanding and willingness to assist pt with completing this each day. Pt reported significant improvement with eating and grooming tasks using red-therafoam for built-up handle and was able to hold soda can in L hand and drink independently. Continue with POC and will continue to follow acutely to continue improving fine motor strength/coordination in preparation for more independent ADL performance.   Follow Up Recommendations  SNF;Supervision/Assistance - 24 hour    Equipment Recommendations  Other (comment) (TBD at next venue)    Recommendations for Other Services      Precautions / Restrictions Precautions Precautions: Fall Restrictions Weight Bearing Restrictions: Yes RLE Weight Bearing: Weight bearing as tolerated LLE Weight Bearing: Weight bearing as tolerated       Mobility Bed Mobility Overal bed mobility: Needs Assistance Bed Mobility: Sit to Supine Rolling: Min guard Sidelying to sit: Min guard   Sit to supine: Min guard   General bed mobility comments: Min guard for safety as pt has decreased use of bilateral hands, but able to reposition self in bed pushing into mattress with both feet.   Transfers Overall transfer level: Needs assistance Equipment used: Rolling walker (2 wheeled) Transfers: Sit to/from Stand Sit to Stand: Min assist         General transfer comment: Min assist for boost to stand and to stabilize balance upon standing. Pt required slight min assist to manage RW and VCs for safety and hand placement prior to sitting.    Balance Overall balance assessment: Needs assistance Sitting-balance support: No upper extremity supported;Feet supported Sitting balance-Leahy Scale: Good     Standing balance support: Bilateral upper extremity supported;During functional activity Standing balance-Leahy Scale: Poor Standing balance comment: Pt continues to be unsteady on his feet and requires BUE support on RW and min assist when completing transfers                   ADL Overall ADL's : Needs assistance/impaired Eating/Feeding: Set up;Sitting;With adaptive utensils Eating/Feeding Details (indicate cue type and reason): Pt reports red thera-foam is extrmely helpul and was able to eat most of meals without mother's assistance; only required setup                     Toilet Transfer: Minimal assistance;Cueing for safety;Stand-pivot;RW;Regular Toilet   Toileting- Clothing Manipulation and Hygiene: Maximal assistance;With caregiver independent assisting;Sit to/from stand       Functional mobility during ADLs: Minimal assistance;Rolling walker;Cueing for safety General ADL Comments: Focus of session on HEP for hand strength and fine motor coordination      Vision                     Perception     Praxis      Cognition   Behavior During Therapy: [facility] for tasks assessed/performed Overall Cognitive Status: Within Functional Limits for tasks assessed  Extremity/Trunk Assessment               Exercises General Exercises - Upper Extremity Shoulder Flexion: AROM;Both;10 reps;Supine;Seated Shoulder ABduction: AROM;Both;10 reps;Supine;Seated Elbow Flexion: AROM;Both;Supine;Seated Elbow Extension: AROM;Both;10 reps;Supine;Seated Wrist Flexion:  AROM;Both;10 reps;Supine;Seated Wrist Extension: AROM;Both;10 reps;Supine;Seated Digit Composite Flexion: AROM;Both;10 reps;Seated;Supine Composite Extension: AROM;Both;10 reps;Supine;Seated Hand Exercises Digit Composite Abduction: AROM;Both;10 reps;Supine;Seated;Other (comment) (tan theraputty) Digit Composite Adduction: AROM;Strengthening;Both;10 reps;Supine;Seated;Other (comment) (tan theraputty) Thumb Abduction: AROM;Strengthening;Both;10 reps;Supine;Seated;Other (comment) (tan theraputty) Thumb Adduction: AROM;Both;10 reps;Supine;Seated;Other (comment) (tan theraputty) Opposition: AROM;Strengthening;Both;10 reps;Supine;Seated;Other (comment) (tan theraputty) Other Exercises Other Exercises: Pinch grasp exercises with each digit to thumb using tan theraputty   Shoulder Instructions       General Comments      Pertinent Vitals/ Pain       Pain Assessment: 0-10 Pain Score: 6  Pain Location: sternum and L upper arm Pain Descriptors / Indicators: Aching;Sore Pain Intervention(s): Limited activity within patient's tolerance;Monitored during session;Repositioned;Premedicated before session  Home Living                                          Prior Functioning/Environment              Frequency Min 3X/week     Progress Toward Goals  OT Goals(current goals can now be found in the care plan section)  Progress towards OT goals: Progressing toward goals  Acute Rehab OT Goals Patient Stated Goal: to get a job and to possibly move in with [place] OT Goal Formulation: With patient Time For Goal Achievement: 11/27/15 Potential to Achieve Goals: Good ADL Goals Pt Will Perform Eating: with set-up;sitting Pt Will Perform Grooming: with set-up;sitting Pt Will Transfer to Toilet: with supervision;bedside commode;ambulating Pt Will Perform Toileting - Clothing Manipulation and hygiene: with min assist;sit to/from stand Pt/caregiver will Perform Home  Exercise Program: Increased ROM;Both right and left upper extremity;With theraputty;With written HEP provided;With Supervision  Plan Discharge plan remains appropriate    Co-evaluation                 End of Session Equipment Utilized During Treatment: Gait belt;Rolling walker   Activity Tolerance Patient tolerated treatment well   Patient Left in bed;with call bell/phone within reach;with bed alarm set;with family/visitor present   Nurse Communication Mobility status        Time: 1440-1510 OT Time Calculation (min): 30 min  Charges: OT General Charges $OT Visit: 1 Procedure OT Treatments $Self Care/Home Management : 8-22 mins $Therapeutic Exercise: 8-22 mins  Nils PyleJulia Kennis Buell, OTR/L Pager: [phone] 11/15/2015, 5:28 PM

## 2015-11-15 NOTE — Progress Notes (Signed)
CSW working with CSW supervisor on placement for this patient- current barrier is that pt is on methadone here in the hospital- very difficult to arrange methadone to be administered by SNF and could greatly delay DC.  MD informed and will attempt to wean pt from methadone- CSW and supervisor continuing to look into placement options  CSW will continue to follow  Kevin Austin, [facility]own[facility]WA Clinical Social Worker [phone]

## 2015-11-15 NOTE — Progress Notes (Signed)
Physical Therapy Treatment Patient Details Name: Kevin Austin MRN: [id number] DOB: April 12, 1991 Today's Date: 11/15/2015    History of Present Illness Pt adm with disseminated MRSA bacteremia. Pt with repeated treatment with surgical drainage of large abscesses including bil hands, bil ankles, sternum, and rt hip. Pt intubated 6/26. Self extubated and then reintubated. Trach decannulated and sternal wound vac placed 11/14/15. PMH - IV drug abuse.     PT Comments    Pt found with resting HR at 136 bpm. He performed bed mobility min guard and transferred sit to stand with min assist for power up and balance. Pt ambulated 50 ft with rest break due to HR at 160 bpm. HR down to 143 bpm before ambulating 25 ft. He reports pain in feet is decreasing, but is still limited by pain in wound vac site. Continue acute follow for mobility, strength and gait training.   Follow Up Recommendations  SNF     Equipment Recommendations  Rolling walker with 5" wheels    Recommendations for Other Services       Precautions / Restrictions Precautions Precautions: Fall Restrictions Weight Bearing Restrictions: No RLE Weight Bearing: Weight bearing as tolerated LLE Weight Bearing: Weight bearing as tolerated    Mobility  Bed Mobility Overal bed mobility: Needs Assistance Bed Mobility: Rolling;Sidelying to Sit Rolling: Min guard Sidelying to sit: Min guard       General bed mobility comments: Min guard for safety as pt has difficulty using bilateral hands secondary to wounds. Requires min verbal cueing for handplacement during sit<> stand.  Transfers Overall transfer level: Needs assistance Equipment used: Rolling walker (2 wheeled) Transfers: Sit to/from Stand Sit to Stand: Min assist         General transfer comment: Min assist for stability during power up and to gain balance.  Ambulation/Gait Ambulation/Gait assistance: Min assist Ambulation Distance (Feet): 50 Feet (+25) Assistive device:  Rolling walker (2 wheeled) Gait Pattern/deviations: Decreased stride length;Step-through pattern;Trunk flexed   Gait velocity interpretation: Below normal speed for age/gender General Gait Details: Chair follow for rest break. HR up to 160 bpm during ambulation. Dropped to 143 at rest.    Stairs            Wheelchair Mobility    Modified Rankin (Stroke Patients Only)       Balance Overall balance assessment: Needs assistance Sitting-balance support: No upper extremity supported;Feet supported Sitting balance-Leahy Scale: Good     Standing balance support: Bilateral upper extremity supported;During functional activity Standing balance-Leahy Scale: Poor Standing balance comment: Requires B UE support                    Cognition Arousal/Alertness: Awake/alert Behavior During Therapy: WFL for tasks assessed/performed Overall Cognitive Status: Within Functional Limits for tasks assessed                      Exercises      General Comments        Pertinent Vitals/Pain Pain Assessment: 0-10 Pain Score: 7  Pain Location: Sternal wound vac drainage site Pain Intervention(s): Monitored during session;Premedicated before session    Home Living                      Prior Function            PT Goals (current goals can now be found in the care plan section) Acute Rehab PT Goals PT Goal Formulation: With patient Time For Goal  Achievement: 11/22/15 Potential to Achieve Goals: Good Progress towards PT goals: Progressing toward goals    Frequency  Min 3X/week    PT Plan Current plan remains appropriate    Co-evaluation             End of Session Equipment Utilized During Treatment: Gait belt Activity Tolerance: Patient limited by fatigue;Patient limited by pain;Treatment limited secondary to medical complications (Comment) (HR at 160 bpm during ambulation) Patient left: in chair;with call bell/phone within reach     Time:  1342-1415 PT Time Calculation (min) (ACUTE ONLY): 33 min  Charges:  $Gait Training: 23-37 mins                    G Codes:      Auda Finfrock 11/15/2015, 3:00 PM  Park Literara A Eino Whitner, SPT (student physical therapist) Acute Rehabilitation Services [phone]

## 2015-11-15 NOTE — Progress Notes (Signed)
      [address]       [place] [id number]             [phone]      Up in chair  Trach decannulated  BP 130/78 mmHg  Pulse 99  Temp(Src) 99.3 F (37.4 C) (Oral)  Resp 16  Ht 6' (1.829 m)  Wt 153 lb 10.6 oz (69.7 kg)  BMI 20.84 kg/m2  SpO2 97%  Sternal wound- VAC in place skin clear.  Hopefully will be able to heal chest wound with VAC and avoid morbidity of flaps  Kevin SpareSteven C. Dorris FetchHendrickson, MD [facility] [phone]

## 2015-11-15 NOTE — Progress Notes (Signed)
Physical Therapy Wound Treatment Patient Details  Name: Kevin Austin MRN: [id number] Date of Birth: May 25, 1990  Today's Date: 11/15/2015 Time: 7829-5621 Time Calculation (min): 32 min  Subjective  Subjective: Pt awake and alert during session.  Patient and Family Stated Goals: None stated Date of Onset:  (Unknown) Prior Treatments: I&D 6/27  Pain Score: Pt reports minimal pain in L hand during packing.  Wound Assessment  Wound / Incision (Open or Dehisced) 10/25/15 Incision - Open Hand Left Dorsal aspect (Active)  Dressing Type Compression wrap;Moist to dry;Gauze (Comment) 11/15/2015  2:26 PM  Dressing Changed Changed 11/15/2015  2:26 PM  Dressing Status Clean;Dry;Intact 11/15/2015  2:26 PM  Dressing Change Frequency Daily 11/15/2015  2:26 PM  Site / Wound Assessment Pink;Yellow 11/15/2015  2:26 PM  % Wound base Red or Granulating 95% 11/15/2015  2:26 PM  % Wound base Yellow 5% 11/15/2015  2:26 PM  % Wound base Black 0% 11/15/2015  2:26 PM  % Wound base Other (Comment) 0% 11/15/2015  2:26 PM  Peri-wound Assessment Intact 11/15/2015  2:26 PM  Wound Length (cm) 3.3 cm 11/03/2015 11:00 AM  Wound Width (cm) 1.5 cm 11/03/2015 11:00 AM  Wound Depth (cm) 1.5 cm 11/03/2015 11:00 AM  Undermining (cm) Undermining at 4:00 1.0cm and at 9:00 2.0cm. 11/03/2015 11:00 AM  Margins Unattached edges (unapproximated) 11/15/2015  2:26 PM  Closure None 11/15/2015  2:26 PM  Drainage Amount Minimal 11/15/2015  2:26 PM  Drainage Description Serosanguineous;Purulent 11/15/2015  2:26 PM  Non-staged Wound Description Not applicable 07/03/6576  4:69 PM  Treatment Hydrotherapy (Pulse lavage);Packing (Impregnated strip) 11/15/2015  2:26 PM     Wound / Incision (Open or Dehisced) 10/25/15 Incision - Open Hand Left Palmar aspect (Active)  Dressing Type Compression wrap;Gauze (Comment);Moist to dry 11/15/2015  2:26 PM  Dressing Changed Changed 11/15/2015  2:26 PM  Dressing Status Clean;Dry;Intact 11/15/2015  2:26 PM  Dressing Change  Frequency Daily 11/15/2015  2:26 PM  Site / Wound Assessment Pink;Yellow 11/15/2015  2:26 PM  % Wound base Red or Granulating 95% 11/15/2015  2:26 PM  % Wound base Yellow 5% 11/15/2015  2:26 PM  % Wound base Black 0% 11/15/2015  2:26 PM  % Wound base Other (Comment) 0% 11/15/2015  2:26 PM  Peri-wound Assessment Intact;Maceration 11/15/2015  2:26 PM  Wound Length (cm) 0.4 cm 11/03/2015 11:00 AM  Wound Width (cm) 3.5 cm 11/03/2015 11:00 AM  Wound Depth (cm) 1.8 cm 11/03/2015 11:00 AM  Undermining (cm) Undermining 4:00 - 7:00 2.0cm. 11/03/2015 11:00 AM  Margins Unattached edges (unapproximated) 11/15/2015  2:26 PM  Closure None 11/15/2015  2:26 PM  Drainage Amount Minimal 11/15/2015  2:26 PM  Drainage Description Serosanguineous;Purulent 11/15/2015  2:26 PM  Non-staged Wound Description Not applicable 10/25/5282  1:32 PM  Treatment Hydrotherapy (Pulse lavage);Packing (Impregnated strip) 11/15/2015  2:26 PM   Hydrotherapy Pulsed lavage therapy - wound location: L hand wounds Pulsed Lavage with Suction (psi): 12 psi Pulsed Lavage with Suction - Normal Saline Used: 1000 mL (Between all wounds) Pulsed Lavage Tip: Tip with splash shield   Wound Assessment and Plan  Wound Therapy - Assess/Plan/Recommendations Wound Therapy - Clinical Statement: Continued yellow purulent drainage on the L hand. R hand is appropriate for 3x/week as it remains 100% granulation.  Wound Therapy - Functional Problem List: Decreased strength/AROM of hands/fingers for ADL's and fine motor tasks.  Factors Delaying/Impairing Wound Healing: Substance abuse;Multiple medical problems Hydrotherapy Plan: Debridement;Dressing change;Patient/family education;Pulsatile lavage with suction Wound Therapy - Frequency: 6X /  week Wound Therapy - Follow Up Recommendations: Other (comment) (CIR vs. SNF - will defer to PT recommendations) Wound Plan: See above  Wound Therapy Goals- Improve the function of patient's integumentary system by progressing  the wound(s) through the phases of wound healing (inflammation - proliferation - remodeling) by: Decrease Necrotic Tissue to: 0 Decrease Necrotic Tissue - Progress: Progressing toward goal Increase Granulation Tissue to: 100 Increase Granulation Tissue - Progress: Progressing toward goal Patient/Family will be able to : complete dressing changes independently prior to d/c Patient/Family Instruction Goal - Progress: Progressing toward goal Goals/treatment plan/discharge plan were made with and agreed upon by patient/family: Yes Time For Goal Achievement: 7 days Wound Therapy - Potential for Goals: Good  Goals will be updated until maximal potential achieved or discharge criteria met.  Discharge criteria: when goals achieved, discharge from hospital, MD decision/surgical intervention, no progress towards goals, refusal/missing three consecutive treatments without notification or medical reason.  GP     Rolinda Roan 11/15/2015, 2:31 PM   Rolinda Roan, PT, DPT Acute Rehabilitation Services Pager: [phone]

## 2015-11-15 NOTE — Progress Notes (Signed)
SLP Cancellation Note  Patient Details Name: Jolene Schimkerey Brookens MRN: [id number] DOB: 09/12/1990   Cancelled treatment:       Reason Eval/Treat Not Completed: Other (comment). Pt has been decannulated. Checked in with pt, voice and volume are appropriate. No SLP needs, will sign off.    Anthon Harpole, Riley NearingBonnie Caroline 11/15/2015, 11:01 AM

## 2015-11-16 LAB — CBC
HCT: 23.3 % — ABNORMAL LOW (ref 39.0–52.0)
HEMOGLOBIN: 7.5 g/dL — AB (ref 13.0–17.0)
MCH: 26 pg (ref 26.0–34.0)
MCHC: 32.2 g/dL (ref 30.0–36.0)
MCV: 80.6 fL (ref 78.0–100.0)
Platelets: 341 10*3/uL (ref 150–400)
RBC: 2.89 MIL/uL — AB (ref 4.22–5.81)
RDW: 14.3 % (ref 11.5–15.5)
WBC: 7.2 10*3/uL (ref 4.0–10.5)

## 2015-11-16 LAB — BASIC METABOLIC PANEL
ANION GAP: 6 (ref 5–15)
BUN: 10 mg/dL (ref 6–20)
CHLORIDE: 97 mmol/L — AB (ref 101–111)
CO2: 27 mmol/L (ref 22–32)
Calcium: 8.5 mg/dL — ABNORMAL LOW (ref 8.9–10.3)
Creatinine, Ser: 0.59 mg/dL — ABNORMAL LOW (ref 0.61–1.24)
GFR calc Af Amer: >60 mL/min (ref >60)
Glucose, Bld: 101 mg/dL — ABNORMAL HIGH (ref 65–99)
POTASSIUM: 4.1 mmol/L (ref 3.5–5.1)
SODIUM: 130 mmol/L — AB (ref 135–145)

## 2015-11-16 LAB — PREPARE RBC (CROSSMATCH)

## 2015-11-16 MED ORDER — SODIUM CHLORIDE 0.9 % IV SOLN: Freq: Once | INTRAVENOUS | Status: DC

## 2015-11-16 MED ORDER — HYDROMORPHONE HCL 1 MG/ML IJ SOLN
1.0000 mg | Freq: Three times a day (TID) | INTRAMUSCULAR | Status: DC | PRN
  Administered 2015-11-16 – 2015-11-17 (×3): 1 mg via INTRAVENOUS
  Filled 2015-11-16 (×3): qty 1

## 2015-11-16 MED ORDER — MORPHINE SULFATE ER 30 MG PO TBCR
30.0000 mg | EXTENDED_RELEASE_TABLET | Freq: Two times a day (BID) | ORAL | Status: DC
  Administered 2015-11-16 – 2015-11-17 (×3): 30 mg via ORAL
  Filled 2015-11-16 (×3): qty 1

## 2015-11-16 NOTE — Progress Notes (Signed)
Physical Therapy Treatment Patient Details Name: Kevin Austin MRN: [id number] DOB: [birth year] Today's Date: 11/16/2015    History of Present Illness Pt adm with disseminated MRSA bacteremia. Pt with repeated treatment with surgical drainage of large abscesses including bil hands, bil ankles, sternum, and rt hip. Pt intubated 6/26. Self extubated and then reintubated. Trach decannulated and sternal wound vac placed 11/14/15. PMH - IV drug abuse.     PT Comments    Patient not feeling well today. Reports increased pain and fatigue mostly likely due to low hemoglobin. Pt due to get blood transfusion this afternoon. Tolerated ambulating a few feet but limited due to dizziness and still requires assist for standing and transfers. HR ranged from 144-152 bpm during session. Increased time to perform all mobility.Encouraged OOB to chair this weekend. Will continue to follow to improve endurance, strength and mobility.   Follow Up Recommendations  SNF     Equipment Recommendations  Rolling walker with 5" wheels    Recommendations for Other Services       Precautions / Restrictions Precautions Precautions: Fall Precaution Comments: watch HR Restrictions Weight Bearing Restrictions: Yes RLE Weight Bearing: Weight bearing as tolerated LLE Weight Bearing: Weight bearing as tolerated    Mobility  Bed Mobility Overal bed mobility: Needs Assistance Bed Mobility: Supine to Sit     Supine to sit: Min guard;HOB elevated     General bed mobility comments: Min guard for safety as pt has decreased use of bilateral hands; slow to get to EOB with encouragement.  Transfers Overall transfer level: Needs assistance Equipment used: Rolling walker (2 wheeled) Transfers: Sit to/from Stand Sit to Stand: Min assist         General transfer comment: MIn A to boost from EOB with cues for hand placement/technique. + dizziness. Stood from Orthoptistchair x1.   Ambulation/Gait Ambulation/Gait assistance: Min  assist Ambulation Distance (Feet): 5 Feet Assistive device: Rolling walker (2 wheeled) Gait Pattern/deviations: Step-to pattern;Decreased stride length;Trunk flexed   Gait velocity interpretation: Below normal speed for age/gender General Gait Details: Slow, unsteady gait with bil knee instability, requiring need to sit. + dizziness as well. Pt with low Hgb. HR ranged from 144-152 bpm.   Stairs            Wheelchair Mobility    Modified Rankin (Stroke Patients Only)       Balance Overall balance assessment: Needs assistance Sitting-balance support: Feet supported;No upper extremity supported Sitting balance-Leahy Scale: Good     Standing balance support: During functional activity Standing balance-Leahy Scale: Poor Standing balance comment: Reliant on BUEs for support in standing. Bil knee instability noted today.                    Cognition Arousal/Alertness: Awake/alert Behavior During Therapy: WFL for tasks assessed/performed Overall Cognitive Status: Within Functional Limits for tasks assessed                      Exercises      General Comments        Pertinent Vitals/Pain Pain Assessment: 0-10 Pain Score: 10-Worst pain ever Pain Location: hands, feet and sternum Pain Descriptors / Indicators: Sore;Aching;Sharp Pain Intervention(s): Monitored during session;RN gave pain meds during session;Repositioned;Limited activity within patient's tolerance    Home Living                      Prior Function            PT  Goals (current goals can now be found in the care plan section) Progress towards PT goals: Not progressing toward goals - comment (secondary to fatigue, dizziness, low hgb)    Frequency  Min 3X/week    PT Plan Current plan remains appropriate    Co-evaluation             End of Session Equipment Utilized During Treatment: Gait belt Activity Tolerance: Patient limited by pain;Patient limited by  fatigue;Treatment limited secondary to medical complications (Comment) (dizziness ) Patient left: in chair;with call bell/phone within reach     Time: 1322-1350 PT Time Calculation (min) (ACUTE ONLY): 28 min  Charges:  $Gait Training: 8-22 mins $Therapeutic Activity: 8-22 mins                    G Codes:      Makella Buckingham A Harjit Douds 11/16/2015, 2:30 PM] Mylo Red, PT, DPT [phone]

## 2015-11-16 NOTE — Progress Notes (Signed)
   Assessment / Plan: 10 Days Post-Op  S/P Procedure(s) (LRB): IRRIGATION AND DEBRIDEMENT HIP (Right) 11/06/15 IRRIGATION AND DEBRIDEMENT FOOT (Left) and (Right) Left Foot on 10/24/15 and Right Ankle 11/01/15  by Dr. Marcial Pacasimothy D. Eulah PontMurphy   Principal Problem:   MRSA bacteremia Active Problems:   Acute respiratory failure (HCC)   Abscess of left hand   Altered mental status   Encounter for central line placement   MRSA pneumonia (HCC)   Sternal osteomyelitis (HCC)   Acute pulmonary edema (HCC)   Septic arthritis of hip (HCC)   IV drug abuse   Septic arthritis of right ankle (HCC)   Sacral osteomyelitis (HCC)   Abscess of right hand   Abscess of left foot   Acute respiratory failure with hypoxemia (HCC)   Bacteremia   MRSA (methicillin resistant staph aureus) culture positive   Acute respiratory failure with hypercapnia (HCC)   Chronic respiratory failure with hypoxia (HCC)   Tracheostomy status (HCC)  Left Foot / Right Ankle  Looking good.  No purulence.   Dorsiflexion/Plantarflexion/EHL/FHL intact.  Distal sensation intact.    Right hip  Wound approximated with nylon suture and steri strips - Remaining Sutures Removed today 11/16/15.  Superficial dry fibrinous layer.  No Erythema or purulence.  Weight Bearing: Weight Bearing as Tolerated (WBAT) B/L LE.   Dressings:  Left Foot:  Wet to Dry Right Ankle: Xeroform, Gauze Right Hip: Steri Strips, Mepilex  Mobilize w/ PT Continue ABX therapy VTE prophylaxis: per primary. Dispo: per primary  Follow up with Dr. Eulah PontMurphy in the office in 2 weeks.  Subjective: In  bed on arrival.  Patient reports minimal soreness.   Moves all extremities independently.  Objective:   VITALS:   Filed Vitals:   11/15/15 0439 11/15/15 0814 11/15/15 2122 11/16/15 0652  BP: 130/78  117/67 127/76  Pulse: 112 99 115 118  Temp: 99.3 F (37.4 C)  99.3 F (37.4 C) 98 F (36.7 C)  TempSrc: Oral  Oral   Resp: 19 16 18 18   Height:      Weight: 69.7 kg  (153 lb 10.6 oz)     SpO2: 100% 97% 97% 96%    Kevin KernHenry Calvin Martensen Austin 11/16/2015, 8:09 AM

## 2015-11-16 NOTE — Progress Notes (Signed)
CSW continuing to follow for placement needs- MD working on getting pt of methadone so they can be placed- hopeful he will be off by tomorrow.  [facility] considering for possible admission- would not be able to admit till early next week if administration approves.  CSW will continue to follow  Merlyn LotJenna Holoman, [facility] CampusCSWA Clinical Social Worker [phone]
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
## 2015-11-16 NOTE — Progress Notes (Signed)
Physical Therapy Wound Treatment Patient Details  Name: Kevin Austin MRN: [id number] Date of Birth: 1990/08/28  Today's Date: 11/16/2015 Time: 1100-1130 Time Calculation (min): 30 min  Subjective  Subjective: Pt awake and alert during session Patient and Family Stated Goals: None stated Date of Onset:  (Unknown) Prior Treatments: I&D 6/27  Pain Score: Pt reports min to mod pain in L hand during packing.   Wound Assessment  Wound / Incision (Open or Dehisced) 10/25/15 Incision - Open Hand Right Dorsal aspect - Ulnar side (Active)  Dressing Type Gauze (Comment);Moist to dry 11/16/2015  2:21 PM  Dressing Changed Changed 11/16/2015  2:21 PM  Dressing Status Clean;Dry;Intact 11/16/2015  2:21 PM  Dressing Change Frequency Daily 11/16/2015  2:21 PM  Site / Wound Assessment Red;Bleeding 11/16/2015  2:21 PM  % Wound base Red or Granulating 100% 11/16/2015  2:21 PM  % Wound base Yellow 0% 11/16/2015  2:21 PM  % Wound base Black 0% 11/16/2015  2:21 PM  % Wound base Other (Comment) 0% 11/16/2015  2:21 PM  Peri-wound Assessment Intact 11/16/2015  2:21 PM  Wound Length (cm) 5.5 cm 11/03/2015 11:00 AM  Wound Width (cm) 2.7 cm 11/03/2015 11:00 AM  Wound Depth (cm) 0.5 cm 11/03/2015 11:00 AM  Tunneling (cm) 0 11/13/2015  1:00 PM  Undermining (cm) 0 11/13/2015  1:00 PM  Margins Unattached edges (unapproximated) 11/16/2015  2:21 PM  Closure None 11/16/2015  2:21 PM  Drainage Amount Minimal 11/16/2015  2:21 PM  Drainage Description Sanguineous 11/16/2015  2:21 PM  Non-staged Wound Description Not applicable 2/77/4128  7:86 PM  Treatment Hydrotherapy (Pulse lavage);Packing (Saline gauze) 11/16/2015  2:21 PM     Wound / Incision (Open or Dehisced) 10/25/15 Incision - Open Hand Right Dorsal aspect - Radial side (Active)  Dressing Type Compression wrap;Gauze (Comment);Moist to dry;Moisture barrier 11/16/2015  2:21 PM  Dressing Changed Changed 11/16/2015  2:21 PM  Dressing Status Clean;Dry;Intact 11/16/2015  2:21 PM  Dressing  Change Frequency Daily 11/16/2015  2:21 PM  Site / Wound Assessment Red;Bleeding 11/16/2015  2:21 PM  % Wound base Red or Granulating 100% 11/16/2015  2:21 PM  % Wound base Yellow 0% 11/16/2015  2:21 PM  % Wound base Black 0% 11/16/2015  2:21 PM  % Wound base Other (Comment) 0% 11/16/2015  2:21 PM  Peri-wound Assessment Intact 11/16/2015  2:21 PM  Wound Length (cm) 4.3 cm 11/03/2015 11:00 AM  Wound Width (cm) 1.5 cm 11/03/2015 11:00 AM  Wound Depth (cm) 0.5 cm 11/03/2015 11:00 AM  Undermining (cm) Undermining at 9:00 to a depth of 1.0.   11/03/2015 11:00 AM  Margins Unattached edges (unapproximated) 11/16/2015  2:21 PM  Closure None 11/16/2015  2:21 PM  Drainage Amount Minimal 11/16/2015  2:21 PM  Drainage Description Sanguineous 11/16/2015  2:21 PM  Non-staged Wound Description Not applicable 7/67/2094  7:09 PM  Treatment Hydrotherapy (Pulse lavage);Packing (Saline gauze) 11/16/2015  2:21 PM     Wound / Incision (Open or Dehisced) 10/25/15 Incision - Open Hand Left Dorsal aspect (Active)  Dressing Type Compression wrap;Moist to dry;Gauze (Comment) 11/16/2015  2:21 PM  Dressing Changed Changed 11/16/2015  2:21 PM  Dressing Status Clean;Dry;Intact 11/16/2015  2:21 PM  Dressing Change Frequency Daily 11/16/2015  2:21 PM  Site / Wound Assessment Pink;Red 11/16/2015  2:21 PM  % Wound base Red or Granulating 100% 11/16/2015  2:21 PM  % Wound base Yellow 0% 11/16/2015  2:21 PM  % Wound base Black 0% 11/16/2015  2:21 PM  %  Wound base Other (Comment) 0% 11/16/2015  2:21 PM  Peri-wound Assessment Intact 11/16/2015  2:21 PM  Wound Length (cm) 3.3 cm 11/03/2015 11:00 AM  Wound Width (cm) 1.5 cm 11/03/2015 11:00 AM  Wound Depth (cm) 1.5 cm 11/03/2015 11:00 AM  Undermining (cm) Undermining at 4:00 1.0cm and at 9:00 2.0cm. 11/03/2015 11:00 AM  Margins Unattached edges (unapproximated) 11/16/2015  2:21 PM  Closure None 11/16/2015  2:21 PM  Drainage Amount Minimal 11/16/2015  2:21 PM  Drainage Description Serosanguineous;Purulent  11/16/2015  2:21 PM  Non-staged Wound Description Not applicable 5/49/8264  1:58 PM  Treatment Hydrotherapy (Pulse lavage);Packing (Impregnated strip) 11/16/2015  2:21 PM     Wound / Incision (Open or Dehisced) 10/25/15 Incision - Open Hand Left Palmar aspect (Active)  Dressing Type Compression wrap;Gauze (Comment);Moist to dry 11/16/2015  2:21 PM  Dressing Changed Changed 11/16/2015  2:21 PM  Dressing Status Clean;Dry;Intact 11/16/2015  2:21 PM  Dressing Change Frequency Daily 11/16/2015  2:21 PM  Site / Wound Assessment Pink;Red 11/16/2015  2:21 PM  % Wound base Red or Granulating 100% 11/16/2015  2:21 PM  % Wound base Yellow 0% 11/16/2015  2:21 PM  % Wound base Black 0% 11/16/2015  2:21 PM  % Wound base Other (Comment) 0% 11/16/2015  2:21 PM  Peri-wound Assessment Intact;Maceration 11/16/2015  2:21 PM  Wound Length (cm) 0.4 cm 11/03/2015 11:00 AM  Wound Width (cm) 3.5 cm 11/03/2015 11:00 AM  Wound Depth (cm) 1.8 cm 11/03/2015 11:00 AM  Undermining (cm) Undermining 4:00 - 7:00 2.0cm. 11/03/2015 11:00 AM  Margins Unattached edges (unapproximated) 11/16/2015  2:21 PM  Closure None 11/16/2015  2:21 PM  Drainage Amount Minimal 11/16/2015  2:21 PM  Drainage Description Serosanguineous;Purulent 11/16/2015  2:21 PM  Non-staged Wound Description Not applicable 07/05/4074  8:08 PM  Treatment Hydrotherapy (Pulse lavage);Packing (Impregnated strip) 11/16/2015  2:21 PM   Hydrotherapy Pulsed lavage therapy - wound location: Bilateral hand wounds Pulsed Lavage with Suction (psi): 12 psi Pulsed Lavage with Suction - Normal Saline Used: 1000 mL (Between all wounds) Pulsed Lavage Tip: Tip with splash shield   Wound Assessment and Plan  Wound Therapy - Assess/Plan/Recommendations Wound Therapy - Clinical Statement: Continued yellow purulent drainage on the L hand. R hand is appropriate for 3x/week as it remains 100% granulation.  Wound Therapy - Functional Problem List: Decreased strength/AROM of hands/fingers for ADL's  and fine motor tasks.  Factors Delaying/Impairing Wound Healing: Substance abuse;Multiple medical problems Hydrotherapy Plan: Debridement;Dressing change;Patient/family education;Pulsatile lavage with suction Wound Therapy - Frequency: 6X / week Wound Therapy - Follow Up Recommendations: Other (comment) (CIR vs. SNF - will defer to PT recommendations) Wound Plan: See above  Wound Therapy Goals- Improve the function of patient's integumentary system by progressing the wound(s) through the phases of wound healing (inflammation - proliferation - remodeling) by: Decrease Necrotic Tissue to: 0 Decrease Necrotic Tissue - Progress: Met Increase Granulation Tissue to: 100 Increase Granulation Tissue - Progress: Met Patient/Family will be able to : complete dressing changes independently prior to d/c Patient/Family Instruction Goal - Progress: Progressing toward goal Goals/treatment plan/discharge plan were made with and agreed upon by patient/family: Yes Time For Goal Achievement: 7 days Wound Therapy - Potential for Goals: Good  Goals will be updated until maximal potential achieved or discharge criteria met.  Discharge criteria: when goals achieved, discharge from hospital, MD decision/surgical intervention, no progress towards goals, refusal/missing three consecutive treatments without notification or medical reason.  GP     Rolinda Roan 11/16/2015, 2:26  PM  Rolinda Roan, PT, DPT Acute Rehabilitation Services Pager: [phone]

## 2015-11-16 NOTE — Progress Notes (Signed)
      [address]       [place] [id number]             [phone]      No complaints  VAC has not yet been changed today  BP 130/81 mmHg  Pulse 133  Temp(Src) 99.3 F (37.4 C) (Oral)  Resp 18  Ht 6' (1.829 m)  Wt 154 lb 12.2 oz (70.2 kg)  BMI 20.99 kg/m2  SpO2 98%  Awaiting placement  Steven C. Dorris FetchHendrickson, MD [facility] [phone]

## 2015-11-17 ENCOUNTER — Inpatient Hospital Stay (HOSPITAL_COMMUNITY): Payer: Medicaid Other

## 2015-11-17 LAB — CBC
HCT: 28.5 % — ABNORMAL LOW (ref 39.0–52.0)
HEMOGLOBIN: 9.7 g/dL — AB (ref 13.0–17.0)
MCH: 26.9 pg (ref 26.0–34.0)
MCHC: 33.7 g/dL (ref 30.0–36.0)
MCV: 79.8 fL (ref 78.0–100.0)
PLATELETS: 399 10*3/uL (ref 150–400)
RBC: 3.57 MIL/uL — AB (ref 4.22–5.81)
RDW: 14.4 % (ref 11.5–15.5)
WBC: 9.3 10*3/uL (ref 4.0–10.5)

## 2015-11-17 MED ORDER — DIAZEPAM 2 MG PO TABS
2.0000 mg | ORAL_TABLET | Freq: Four times a day (QID) | ORAL | Status: DC | PRN
  Administered 2015-11-17 – 2015-11-19 (×3): 2 mg via ORAL
  Filled 2015-11-17 (×3): qty 1

## 2015-11-17 MED ORDER — HYDROMORPHONE HCL 2 MG PO TABS: 2.0000 mg | ORAL_TABLET | ORAL | Status: DC | PRN

## 2015-11-17 MED ORDER — ENOXAPARIN SODIUM 40 MG/0.4ML ~~LOC~~ SOLN
40.0000 mg | SUBCUTANEOUS | Status: DC
  Administered 2015-11-17 – 2015-12-18 (×29): 40 mg via SUBCUTANEOUS
  Filled 2015-11-17 (×34): qty 0.4

## 2015-11-17 MED ORDER — METOPROLOL TARTRATE 5 MG/5ML IV SOLN
2.5000 mg | INTRAVENOUS | Status: DC | PRN
  Administered 2015-11-20 – 2015-11-24 (×2): 2.5 mg via INTRAVENOUS
  Filled 2015-11-17 (×2): qty 5

## 2015-11-17 MED ORDER — MORPHINE SULFATE ER 30 MG PO TBCR
45.0000 mg | EXTENDED_RELEASE_TABLET | Freq: Two times a day (BID) | ORAL | Status: DC
  Administered 2015-11-17 – 2015-11-19 (×4): 45 mg via ORAL
  Filled 2015-11-17 (×4): qty 1

## 2015-11-17 MED ORDER — HYDROMORPHONE HCL 1 MG/ML IJ SOLN
1.0000 mg | Freq: Once | INTRAMUSCULAR | Status: AC
  Administered 2015-11-17: 1 mg via INTRAVENOUS
  Filled 2015-11-17: qty 1

## 2015-11-17 MED ORDER — HYDROMORPHONE HCL 2 MG PO TABS
2.0000 mg | ORAL_TABLET | ORAL | Status: DC | PRN
  Administered 2015-11-17 – 2015-11-19 (×6): 4 mg via ORAL
  Filled 2015-11-17 (×9): qty 2

## 2015-11-17 MED ORDER — ONDANSETRON HCL 4 MG/2ML IJ SOLN
4.0000 mg | Freq: Four times a day (QID) | INTRAMUSCULAR | Status: DC | PRN
  Administered 2015-11-17 – 2015-12-14 (×7): 4 mg via INTRAVENOUS
  Filled 2015-11-17 (×7): qty 2

## 2015-11-17 MED ORDER — HYDROMORPHONE HCL 1 MG/ML IJ SOLN
1.0000 mg | Freq: Three times a day (TID) | INTRAMUSCULAR | Status: DC | PRN
  Administered 2015-11-17 – 2015-11-18 (×2): 1 mg via INTRAVENOUS
  Filled 2015-11-17 (×2): qty 1

## 2015-11-17 MED ORDER — METOPROLOL TARTRATE 12.5 MG HALF TABLET
12.5000 mg | ORAL_TABLET | Freq: Two times a day (BID) | ORAL | Status: DC
  Administered 2015-11-17: 12.5 mg via ORAL
  Filled 2015-11-17: qty 1

## 2015-11-17 NOTE — Progress Notes (Signed)
Physical Therapy Wound Treatment Patient Details  Name: Kevin Austin MRN: [id number] Date of Birth: 11-07-1990  Today's Date: 11/17/2015 Time: 8185-6314 Time Calculation (min): 20 min  Subjective  Subjective: No c/o's Patient and Family Stated Goals: None stated Date of Onset:  (Unknown) Prior Treatments: I&D 6/27  Pain Score: Pain Score: Moderate pain with packing of lt hand  Wound Assessment  Wound / Incision (Open or Dehisced) 10/25/15 Incision - Open Hand Left Dorsal aspect (Active)  Dressing Type Compression wrap;Moist to dry;Gauze (Comment) 11/17/2015 11:03 AM  Dressing Changed Changed 11/17/2015 11:03 AM  Dressing Status Clean;Dry;Intact 11/17/2015 11:03 AM  Dressing Change Frequency Daily 11/17/2015 11:03 AM  Site / Wound Assessment Pink;Red 11/17/2015 11:03 AM  % Wound base Red or Granulating 100% 11/17/2015 11:03 AM  % Wound base Yellow 0% 11/17/2015 11:03 AM  % Wound base Black 0% 11/17/2015 11:03 AM  % Wound base Other (Comment) 0% 11/17/2015 11:03 AM  Peri-wound Assessment Intact 11/17/2015 11:03 AM  Wound Length (cm) 3.3 cm 11/03/2015 11:00 AM  Wound Width (cm) 1.5 cm 11/03/2015 11:00 AM  Wound Depth (cm) 1.5 cm 11/03/2015 11:00 AM  Undermining (cm) Undermining at 4:00 1.0cm and at 9:00 2.0cm. 11/03/2015 11:00 AM  Margins Unattached edges (unapproximated) 11/17/2015 11:03 AM  Closure None 11/17/2015 11:03 AM  Drainage Amount Minimal 11/17/2015 11:03 AM  Drainage Description Serosanguineous;Purulent 11/17/2015 11:03 AM  Non-staged Wound Description Not applicable 9/70/2637 85:88 AM  Treatment Hydrotherapy (Pulse lavage);Packing (Impregnated strip) 11/17/2015 11:03 AM     Wound / Incision (Open or Dehisced) 10/25/15 Incision - Open Hand Left Palmar aspect (Active)  Dressing Type Compression wrap;Gauze (Comment);Moist to dry 11/17/2015 11:03 AM  Dressing Changed Changed 11/17/2015 11:03 AM  Dressing Status Clean;Dry;Intact 11/17/2015 11:03 AM  Dressing Change Frequency Daily 11/17/2015 11:03 AM   Site / Wound Assessment Pink;Red 11/17/2015 11:03 AM  % Wound base Red or Granulating 100% 11/17/2015 11:03 AM  % Wound base Yellow 0% 11/17/2015 11:03 AM  % Wound base Black 0% 11/17/2015 11:03 AM  % Wound base Other (Comment) 0% 11/17/2015 11:03 AM  Peri-wound Assessment Intact;Maceration 11/17/2015 11:03 AM  Wound Length (cm) 0.4 cm 11/03/2015 11:00 AM  Wound Width (cm) 3.5 cm 11/03/2015 11:00 AM  Wound Depth (cm) 1.8 cm 11/03/2015 11:00 AM  Undermining (cm) Undermining 4:00 - 7:00 2.0cm. 11/03/2015 11:00 AM  Margins Unattached edges (unapproximated) 11/17/2015 11:03 AM  Closure None 11/17/2015 11:03 AM  Drainage Amount Minimal 11/17/2015 11:03 AM  Drainage Description Serosanguineous;Purulent 11/17/2015 11:03 AM  Non-staged Wound Description Not applicable 08/28/7739 28:78 AM  Treatment Hydrotherapy (Pulse lavage);Packing (Impregnated strip) 11/17/2015 11:03 AM   Hydrotherapy Pulsed lavage therapy - wound location: lt hand Pulsed Lavage with Suction (psi): 8 psi Pulsed Lavage with Suction - Normal Saline Used: 500 mL Pulsed Lavage Tip: Tip with splash shield   Wound Assessment and Plan  Wound Therapy - Assess/Plan/Recommendations Wound Therapy - Clinical Statement: Wounds of lt hand look good. No purulence noted today. Had pt perform AROM of lt hand. Wound Therapy - Functional Problem List: Decreased strength/AROM of hands/fingers for ADL's and fine motor tasks.  Factors Delaying/Impairing Wound Healing: Substance abuse;Multiple medical problems Hydrotherapy Plan: Debridement;Dressing change;Patient/family education;Pulsatile lavage with suction Wound Therapy - Frequency: 6X / week Wound Therapy - Follow Up Recommendations: Skilled nursing facility Wound Plan: See above  Wound Therapy Goals- Improve the function of patient's integumentary system by progressing the wound(s) through the phases of wound healing (inflammation - proliferation - remodeling) by: Decrease Necrotic Tissue  to: 0 Decrease  Necrotic Tissue - Progress: Met Increase Granulation Tissue to: 100 Increase Granulation Tissue - Progress: Met Patient/Family will be able to : complete dressing changes independently prior to d/c Patient/Family Instruction Goal - Progress: Progressing toward goal  Goals will be updated until maximal potential achieved or discharge criteria met.  Discharge criteria: when goals achieved, discharge from hospital, MD decision/surgical intervention, no progress towards goals, refusal/missing three consecutive treatments without notification or medical reason.  GP     Kevin Austin 11/17/2015, 11:11 AM Suanne Marker PT [phone]
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
## 2015-11-18 ENCOUNTER — Inpatient Hospital Stay (HOSPITAL_COMMUNITY): Payer: Medicaid Other

## 2015-11-18 LAB — TYPE AND SCREEN
ABO/RH(D): A NEG
ANTIBODY SCREEN: NEGATIVE
UNIT DIVISION: 0
Unit division: 0

## 2015-11-18 LAB — CBC
HEMATOCRIT: 27.3 % — AB (ref 39.0–52.0)
HEMOGLOBIN: 9 g/dL — AB (ref 13.0–17.0)
MCH: 26.7 pg (ref 26.0–34.0)
MCHC: 33 g/dL (ref 30.0–36.0)
MCV: 81 fL (ref 78.0–100.0)
Platelets: 336 10*3/uL (ref 150–400)
RBC: 3.37 MIL/uL — AB (ref 4.22–5.81)
RDW: 14.8 % (ref 11.5–15.5)
WBC: 6 10*3/uL (ref 4.0–10.5)

## 2015-11-18 LAB — BASIC METABOLIC PANEL
ANION GAP: 7 (ref 5–15)
BUN: 12 mg/dL (ref 6–20)
CALCIUM: 8.5 mg/dL — AB (ref 8.9–10.3)
CO2: 26 mmol/L (ref 22–32)
Chloride: 99 mmol/L — ABNORMAL LOW (ref 101–111)
Creatinine, Ser: 0.58 mg/dL — ABNORMAL LOW (ref 0.61–1.24)
GFR calc non Af Amer: >60 mL/min (ref >60)
GLUCOSE: 119 mg/dL — AB (ref 65–99)
POTASSIUM: 3.5 mmol/L (ref 3.5–5.1)
Sodium: 132 mmol/L — ABNORMAL LOW (ref 135–145)

## 2015-11-18 MED ORDER — ZOLPIDEM TARTRATE 5 MG PO TABS
ORAL_TABLET | ORAL | Status: AC
  Administered 2015-11-18: 5 mg
  Filled 2015-11-18: qty 1

## 2015-11-18 MED ORDER — LORAZEPAM 2 MG/ML IJ SOLN
INTRAMUSCULAR | Status: AC
  Administered 2015-11-18: 1 mg
  Filled 2015-11-18: qty 1

## 2015-11-18 MED ORDER — HYDROMORPHONE HCL 1 MG/ML IJ SOLN
1.0000 mg | Freq: Four times a day (QID) | INTRAMUSCULAR | Status: DC | PRN
  Administered 2015-11-18: 1 mg via INTRAVENOUS
  Filled 2015-11-18: qty 1

## 2015-11-18 MED ORDER — ZOLPIDEM TARTRATE 5 MG PO TABS: 5.0000 mg | ORAL_TABLET | Freq: Once | ORAL | Status: DC

## 2015-11-18 MED ORDER — METOPROLOL TARTRATE 12.5 MG HALF TABLET
12.5000 mg | ORAL_TABLET | Freq: Two times a day (BID) | ORAL | Status: DC
  Administered 2015-11-18 – 2015-11-19 (×3): 12.5 mg via ORAL
  Filled 2015-11-18 (×3): qty 1

## 2015-11-18 MED ORDER — LORAZEPAM 2 MG/ML IJ SOLN
1.0000 mg | Freq: Once | INTRAMUSCULAR | Status: DC
Start: 2015-11-18 — End: 2015-11-19

## 2015-11-18 NOTE — Clinical Social Work Note (Signed)
Chart review indicates that rapid response nurse contacted early this morning.  No Code Stroke needed;  Neurology will follow up.  CSW services will continue to monitor for stability and SNF placement.  Lorri Frederick. Jaci Lazier, LCSW [phone]  (weekend coverage)

## 2015-11-18 NOTE — Progress Notes (Signed)
During the night, patient complained of loss of feeling to right leg to charge nurse. Per charge nurse, patient had decreased dosiflexion and plantarflexion. Patient recently had an I&D of the right hip. Rapid response paged at [phone] and patient assessed. Per rapid response nurse, no code stroke needed and she will consult neurology to see if further evaluation needed. Night coverage provider notified and agreed with plan from rapid response nurse.
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
## 2015-11-18 NOTE — Progress Notes (Signed)
12 Days Post-Op Procedure(s) (LRB): IRRIGATION AND DEBRIDEMENT HIP (Right) Subjective: Denies significant shortness of breath or chest pain  Objective: Vital signs in last 24 hours: Temp:  [98.4 F (36.9 C)-100.5 F (38.1 C)] 98.4 F (36.9 C) (07/23 1430) Pulse Rate:  [121-135] 128 (07/23 1430) Cardiac Rhythm: Sinus tachycardia (07/23 0700) Resp:  [17-20] 17 (07/23 1430) BP: (123-128)/(62-81) 128/74 (07/23 1430) SpO2:  [96 %-98 %] 96 % (07/23 1430)  Hemodynamic parameters for last 24 hours:    Intake/Output from previous day: 07/22 0701 - 07/23 0700 In: 0  Out: 700 [Urine:700] Intake/Output this shift: Total I/O In: 560 [P.O.:560] Out: 975 [Urine:975]  Lab Results:  Recent Labs  11/17/15 0410 11/18/15 0410  WBC 9.3 6.0  HGB 9.7* 9.0*  HCT 28.5* 27.3*  PLT 399 336   BMET:  Recent Labs  11/16/15 1100 11/18/15 0410  NA 130* 132*  K 4.1 3.5  CL 97* 99*  CO2 27 26  GLUCOSE 101* 119*  BUN 10 12  CREATININE 0.59* 0.58*  CALCIUM 8.5* 8.5*    PT/INR: No results for input(s): LABPROT, INR in the last 72 hours. ABG    Component Value Date/Time   PHART 7.446 10/29/2015 0823   HCO3 37.7 (H) 10/29/2015 0823   TCO2 39 10/29/2015 0823   ACIDBASEDEF 1.0 10/22/2015 1832   O2SAT 100.0 10/29/2015 0823   CBG (last 3)  No results for input(s): GLUCAP in the last 72 hours.  Assessment/Plan:  CXR yesterday shows a moderate sized loculated right pleural effusion that is larger than on prior CXR from 7/12.  He has low grade fever to 100.5, normal WBC ct. Will get CT chest to evaluate. He may be developing an empyema related to septic emboli.   LOS: 27 days    Alleen Borne 11/18/2015

## 2015-11-19 DIAGNOSIS — R401 Stupor: Secondary | ICD-10-CM

## 2015-11-19 LAB — BASIC METABOLIC PANEL
Anion gap: 8 (ref 5–15)
BUN: 13 mg/dL (ref 6–20)
CHLORIDE: 97 mmol/L — AB (ref 101–111)
CO2: 25 mmol/L (ref 22–32)
CREATININE: 0.63 mg/dL (ref 0.61–1.24)
Calcium: 8.5 mg/dL — ABNORMAL LOW (ref 8.9–10.3)
GFR calc non Af Amer: >60 mL/min (ref >60)
Glucose, Bld: 115 mg/dL — ABNORMAL HIGH (ref 65–99)
POTASSIUM: 4.1 mmol/L (ref 3.5–5.1)
SODIUM: 130 mmol/L — AB (ref 135–145)

## 2015-11-19 LAB — CBC
HCT: 28.2 % — ABNORMAL LOW (ref 39.0–52.0)
HEMOGLOBIN: 9.4 g/dL — AB (ref 13.0–17.0)
MCH: 26.7 pg (ref 26.0–34.0)
MCHC: 33.3 g/dL (ref 30.0–36.0)
MCV: 80.1 fL (ref 78.0–100.0)
Platelets: 320 10*3/uL (ref 150–400)
RBC: 3.52 MIL/uL — AB (ref 4.22–5.81)
RDW: 15.2 % (ref 11.5–15.5)
WBC: 9.5 10*3/uL (ref 4.0–10.5)

## 2015-11-19 LAB — GLUCOSE, CAPILLARY: GLUCOSE-CAPILLARY: 135 mg/dL — AB (ref 65–99)

## 2015-11-19 MED ORDER — VANCOMYCIN HCL 10 G IV SOLR
1500.0000 mg | INTRAVENOUS | Status: AC
  Administered 2015-11-19: 1500 mg via INTRAVENOUS
  Filled 2015-11-19: qty 1500

## 2015-11-19 MED ORDER — PRO-STAT SUGAR FREE PO LIQD
30.0000 mL | Freq: Two times a day (BID) | ORAL | Status: DC
  Administered 2015-11-19 – 2015-12-18 (×44): 30 mL via ORAL
  Filled 2015-11-19 (×49): qty 30

## 2015-11-19 MED ORDER — LORAZEPAM 2 MG/ML IJ SOLN
2.0000 mg | Freq: Four times a day (QID) | INTRAMUSCULAR | Status: DC
Start: 2015-11-19 — End: 2015-11-23
  Administered 2015-11-19 – 2015-11-23 (×14): 2 mg via INTRAVENOUS
  Filled 2015-11-19 (×15): qty 1

## 2015-11-19 MED ORDER — VANCOMYCIN HCL 10 G IV SOLR
1250.0000 mg | Freq: Three times a day (TID) | INTRAVENOUS | Status: DC
  Administered 2015-11-20 – 2015-12-01 (×33): 1250 mg via INTRAVENOUS
  Filled 2015-11-19 (×38): qty 1250

## 2015-11-19 MED ORDER — ADULT MULTIVITAMIN W/MINERALS CH
1.0000 | ORAL_TABLET | Freq: Every day | ORAL | Status: DC
  Administered 2015-11-19 – 2015-12-18 (×29): 1 via ORAL
  Filled 2015-11-19 (×30): qty 1

## 2015-11-19 MED ORDER — IBUPROFEN 200 MG PO TABS
800.0000 mg | ORAL_TABLET | Freq: Once | ORAL | Status: AC
Start: 2015-11-19 — End: 2015-11-20
  Administered 2015-11-20: 800 mg via ORAL
  Filled 2015-11-19: qty 4

## 2015-11-19 MED ORDER — ACETAMINOPHEN 10 MG/ML IV SOLN
1000.0000 mg | Freq: Four times a day (QID) | INTRAVENOUS | Status: AC | PRN
  Filled 2015-11-19: qty 100

## 2015-11-19 MED ORDER — HYDROMORPHONE HCL 1 MG/ML IJ SOLN
1.0000 mg | INTRAMUSCULAR | Status: DC | PRN
  Administered 2015-11-20 – 2015-11-27 (×32): 1 mg via INTRAVENOUS
  Filled 2015-11-19 (×35): qty 1

## 2015-11-19 MED ORDER — PIPERACILLIN-TAZOBACTAM 3.375 G IVPB
3.3750 g | Freq: Three times a day (TID) | INTRAVENOUS | Status: DC
  Administered 2015-11-20 (×2): 3.375 g via INTRAVENOUS
  Filled 2015-11-19 (×6): qty 50

## 2015-11-19 MED ORDER — LORAZEPAM 2 MG/ML IJ SOLN
1.0000 mg | Freq: Four times a day (QID) | INTRAMUSCULAR | Status: DC | PRN
  Administered 2015-11-19: 1 mg via INTRAVENOUS
  Filled 2015-11-19: qty 1

## 2015-11-19 MED ORDER — METHADONE HCL 10 MG PO TABS
20.0000 mg | ORAL_TABLET | Freq: Three times a day (TID) | ORAL | Status: DC
  Administered 2015-11-19 – 2015-11-24 (×16): 20 mg via ORAL
  Filled 2015-11-19 (×16): qty 2

## 2015-11-19 MED ORDER — SODIUM CHLORIDE 0.9 % IV BOLUS (SEPSIS)
500.0000 mL | Freq: Once | INTRAVENOUS | Status: AC
  Administered 2015-11-19: 500 mL via INTRAVENOUS

## 2015-11-19 MED ORDER — SODIUM CHLORIDE 0.9 % IV BOLUS (SEPSIS): 500.0000 mL | Freq: Once | INTRAVENOUS | Status: DC

## 2015-11-19 MED ORDER — LORAZEPAM 2 MG/ML IJ SOLN
1.0000 mg | INTRAMUSCULAR | Status: DC | PRN
  Administered 2015-11-24: 1 mg via INTRAVENOUS
  Filled 2015-11-19: qty 1

## 2015-11-19 NOTE — Progress Notes (Signed)
OT Cancellation Note  Patient Details Name: Kevin Austin MRN: [id number] DOB: Oct 28, 1990   Cancelled Treatment:    Reason Eval/Treat Not Completed: Other (comment). Pt declined telling therapists to get out of his room. Will try later.   [facility] Lygia Olaes, OTR/L  [phone] 11/19/2015 11/19/2015, 11:01 AM

## 2015-11-19 NOTE — Progress Notes (Signed)
Nutrition Follow-up  DOCUMENTATION CODES:   Not applicable  INTERVENTION:   -Continue Ensure Enlive po BID, each supplement provides 350 kcal and 20 grams of protein -30 ml Prostat BID, each supplement provides 100 kcals and 15 grams protein -MVI daily  NUTRITION DIAGNOSIS:   Increased nutrient needs related to wound healing as evidenced by estimated needs.  Ongoing  GOAL:   Patient will meet greater than or equal to 90% of their needs  Progressing  MONITOR:   PO intake, Supplement acceptance, Labs, Weight trends, Skin, I & O's  REASON FOR ASSESSMENT:   Consult Enteral/tube feeding initiation and management  ASSESSMENT:   25 year old admitted with severe sepsis, multiple septic emboli. 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  Multiple RNs and MD in room speaking to pt at time of visit.   Reviewed wt hx, which has been stable since last visit.   Trach capped on 7/18 and pt decannulated on 11/14/15.   Reviewed CWOCN note from 11/14/15; pt with full thickness midline sternal wound. Wound vac placed on 11/14/15.   Hydrotherapy initiated on 11/14/15 by PT for bilateral open hand wounds. Hydrotherapy d/c after today's session and will transition to wet to dry dressings by bedside RN.   Meal completion 100%. Per MAR, pt is consuming Ensure supplements.   Labs reviewed: Na: 130 (on IV supplementation).   Diet Order:  Diet regular Room service appropriate?: Yes; Fluid consistency:: Thin  Skin:  Wound (see comment) (multiple incisions and open wounds, sternal wound vac)  Last BM:  11/17/15  Height:   Ht Readings from Last 1 Encounters:  11/14/15 6' (1.829 m)    Weight:   Wt Readings from Last 1 Encounters:  11/19/15 151 lb 14.4 oz (68.9 kg)    Ideal Body Weight:  78.2 kg  BMI:  Body mass index is 20.6 kg/m.  Estimated Nutritional Needs:   Kcal:  2300-2500  Protein:  140-160 gm  Fluid:  >2.3 L  EDUCATION NEEDS:   No education needs  identified at this time  Lakendria Nicastro A. Mayford Knife, RD, LDN, CDE Pager: [phone] After hours Pager: [phone]
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
## 2015-11-19 NOTE — Progress Notes (Signed)
11/19/2015 Patient transfer from 5w to 2c at 1700. He was alert and oriented. He was placed on telemetry, contact sign was placed. He was given a CHG bath. Patient have a wound vac mid chest, wound on bilateral hand, left hip steri strip and wound on bilateral feet. Hands and feet have dressing. Patient have a stoma, but did not want Rn to assess. Central monitor and elink was made aware patient was present in room. [facility] RN.

## 2015-11-19 NOTE — Progress Notes (Signed)
Paged on call provider in regards to patient being tachycardic and febrile. Awaiting return page. Tylenol administered

## 2015-11-19 NOTE — Progress Notes (Signed)
PT Cancellation Note  Patient Details Name: Kevin Austin MRN: [id number] DOB: 16-Jan-1991   Cancelled Treatment:    Reason Eval/Treat Not Completed: Patient declined, no reason specified Pt declined and reported he "hurts all over". Pt reported that RN aware and asked for pain meds. PT will check on pt later as time allows.    Derek Mound, PTA Pager: [phone]   11/19/2015, 11:02 AM

## 2015-11-19 NOTE — Progress Notes (Addendum)
Pharmacy Antibiotic Note  [place] is a 25 y.o. male admitted on 10/22/2015 with disseminated MRSA infection.  Pharmacy has been consulted for vancomycin and Zosyn dosing for sepsis, fever, hallucinations, and right effusion on CT. Patient known to pharmacy from previous vancomycin dosing. As noted below, was hard to keep patient therapeutic on vancomycin so he was ultimately switched to ceftaroline. Plan was to continue this through 8/22 then switch to doxycycline for a month afterwards. Renal function is stable.   Plan: Vancomycin 1500 mg IV x1 then 1250 mg IV q8h Zosyn 3.375g IV q8h (4 hour infusion).  Needs Vt at steady-state given infection and previous dosing history  Height: 6' (182.9 cm) Weight: 151 lb 14.4 oz (68.9 kg) IBW/kg (Calculated) : 77.6  Temp (24hrs), Avg:99.6 F (37.6 C), Min:98.4 F (36.9 C), Max:101.9 F (38.8 C)   Recent Labs Lab 11/14/15 0415 11/15/15 0420 11/15/15 2050 11/16/15 1100 11/17/15 0410 11/18/15 0410 11/19/15 0500  WBC 10.3  --  9.3 7.2 9.3 6.0 9.5  CREATININE 0.66 0.60*  --  0.59*  --  0.58* 0.63    Estimated Creatinine Clearance: 140 mL/min (by C-G formula based on SCr of 0.8 mg/dL).    Allergies  Allergen Reactions  . Ketorolac Nausea Only    Per [facility] records  . Tramadol Nausea Only    Per [facility] records    Antimicrobials this admission: Rifampin 7/11 > 7/19 Ceftaroline 7/11 >> 7/24 Vanc 6/26 > 7/11, 7/24>> Zosyn 6/26 >>6/27, 7/24>> Cefepime 6/26 >6/26 Ceftaz 6/27>>6/28  Dose adjustments this admission: 6/28 VT = on 1 g q8h 6/30 VT = 14 on 1250 mg q8h 7/1 VT = 20 on 1500 mg q8h (drawn 3 hrs after dose) 7/2 VT = 17 on 1500 mg q8h 7/5 VT = 34 on 1500 mg q8h 7/6 VR = 10, resume at 1500 mg q12h 7/8 VT = 10 incr to 1750 mg q12h 7/10 VT = 11 incr to 1250 mg q8h  Microbiology results: 6/26 BCx2: MRSA 6/26 BCID: MRSA 6/26 UCx: neg 6/26 Sputum: MRSA 6/26 MRSA PCR: pos Multiple Wounds/Abscess = MRSA 6/28 TA: few  MRSA 6/28 BCx2: neg 6/28 BCID: MSSA 6/30 BCx2: neg 7/1 ankle fluid: mod MRSA 7/6 sternum abscess: MRSA 7/10 resp cx: MRSA 7/10 blood cx: CoNS in 1/2 7/10: UCx: neg 7/11 hip wound: neg  Thank you for allowing pharmacy to be a part of this patient's care.  Loura Back, PharmD, BCPS Clinical Pharmacist Pager: [phone] 11/19/2015 2:36 PM

## 2015-11-19 NOTE — Progress Notes (Signed)
Physical Therapy Wound Treatment and DC note Patient Details  Name: Kevin Austin MRN: [id number] Date of Birth: December 30, 1990  Today's Date: 11/19/2015 Time: 0919-1000 Time Calculation (min): 41 min  Subjective  Subjective: Pt stating he didn't feel good. Stated, "Whatever is in your hand just go ahead and do it." Pt shown nothing was in hands and then he apologized. Patient and Family Stated Goals: None stated Date of Onset:  (Unknown) Prior Treatments: I&D 6/27  Pain Score:  Grimace with removal of packing  Wound Assessment     Wound / Incision (Open or Dehisced) 10/25/15 Incision - Open Hand Right Dorsal aspect - Ulnar side (Active)  Dressing Type Gauze (Comment);Moist to dry 11/19/2015 12:08 PM  Dressing Changed Changed 11/19/2015 12:08 PM  Dressing Status Clean;Dry;Intact 11/19/2015 12:08 PM  Dressing Change Frequency Daily 11/19/2015 12:08 PM  Site / Wound Assessment Red 11/19/2015 12:08 PM  % Wound base Red or Granulating 100% 11/19/2015 12:08 PM  % Wound base Yellow 0% 11/19/2015 12:08 PM  % Wound base Black 0% 11/19/2015 12:08 PM  % Wound base Other (Comment) 0% 11/19/2015 12:08 PM  Peri-wound Assessment Intact 11/19/2015 12:08 PM  Wound Length (cm) 5.5 cm 11/03/2015 11:00 AM  Wound Width (cm) 2.7 cm 11/03/2015 11:00 AM  Wound Depth (cm) 0.5 cm 11/03/2015 11:00 AM  Tunneling (cm) 0 11/13/2015  1:00 PM  Undermining (cm) 0 11/13/2015  1:00 PM  Margins Unattached edges (unapproximated) 11/19/2015 12:08 PM  Closure None 11/19/2015 12:08 PM  Drainage Amount Minimal 11/19/2015 12:08 PM  Drainage Description Serosanguineous 11/19/2015 12:08 PM  Non-staged Wound Description Not applicable 2/33/6122  4:49 PM  Treatment Hydrotherapy (Pulse lavage);Packing (Saline gauze) 11/19/2015 12:08 PM     Wound / Incision (Open or Dehisced) 10/25/15 Incision - Open Hand Right Dorsal aspect - Radial side (Active)  Dressing Type Gauze (Comment);Moist to dry 11/19/2015 12:08 PM  Dressing Changed Changed 11/19/2015 12:08  PM  Dressing Status Clean;Dry;Intact 11/19/2015 12:08 PM  Dressing Change Frequency Daily 11/19/2015 12:08 PM  Site / Wound Assessment Red 11/19/2015 12:08 PM  % Wound base Red or Granulating 100% 11/19/2015 12:08 PM  % Wound base Yellow 0% 11/19/2015 12:08 PM  % Wound base Black 0% 11/19/2015 12:08 PM  % Wound base Other (Comment) 0% 11/19/2015 12:08 PM  Peri-wound Assessment Intact 11/19/2015 12:08 PM  Wound Length (cm) 4.3 cm 11/03/2015 11:00 AM  Wound Width (cm) 1.5 cm 11/03/2015 11:00 AM  Wound Depth (cm) 0.5 cm 11/03/2015 11:00 AM  Undermining (cm) 0  11/03/2015 11:00 AM  Margins Unattached edges (unapproximated) 11/16/2015  2:21 PM  Closure None 11/19/2015 12:08 PM  Drainage Amount Minimal 11/19/2015 12:08 PM  Drainage Description Serosanguineous 11/19/2015 12:08 PM  Non-staged Wound Description Not applicable 7/53/0051 10:21 PM  Treatment Hydrotherapy (Pulse lavage);Packing (Saline gauze) 11/19/2015 12:08 PM     Wound / Incision (Open or Dehisced) 10/25/15 Incision - Open Hand Left Dorsal aspect (Active)  Dressing Type Moist to dry;Gauze (Comment) 11/19/2015 12:08 PM  Dressing Changed Changed 11/19/2015 12:08 PM  Dressing Status Clean;Dry;Intact 11/19/2015 12:08 PM  Dressing Change Frequency Daily 11/19/2015 12:08 PM  Site / Wound Assessment Pink;Red 11/19/2015 12:08 PM  % Wound base Red or Granulating 100% 11/19/2015 12:08 PM  % Wound base Yellow 0% 11/19/2015 12:08 PM  % Wound base Black 0% 11/19/2015 12:08 PM  % Wound base Other (Comment) 0% 11/19/2015 12:08 PM  Peri-wound Assessment Intact 11/19/2015 12:08 PM  Wound Length (cm) 3.3 cm 11/03/2015 11:00 AM  Wound  Width (cm) 1.5 cm 11/03/2015 11:00 AM  Wound Depth (cm) 1.5 cm 11/03/2015 11:00 AM  Undermining (cm) Undermining at 4:00 .5cm and at 9:00 .5 cm. 11/03/2015 11:00 AM  Margins Unattached edges (unapproximated) 11/19/2015 12:08 PM  Closure None 11/19/2015 12:08 PM  Drainage Amount Minimal 11/19/2015 12:08 PM  Drainage Description Serosanguineous;Purulent  11/19/2015 12:08 PM  Non-staged Wound Description Not applicable 5/99/3570 17:79 PM  Treatment Hydrotherapy (Pulse lavage);Packing (Impregnated strip) 11/19/2015 12:08 PM     Wound / Incision (Open or Dehisced) 10/25/15 Incision - Open Hand Left Palmar aspect (Active)  Dressing Type Gauze (Comment);Moist to dry 11/19/2015 12:08 PM  Dressing Changed Changed 11/19/2015 12:08 PM  Dressing Status Clean;Dry;Intact 11/19/2015 12:08 PM  Dressing Change Frequency Daily 11/19/2015 12:08 PM  Site / Wound Assessment Pink;Red 11/19/2015 12:08 PM  % Wound base Red or Granulating 100% 11/19/2015 12:08 PM  % Wound base Yellow 0% 11/19/2015 12:08 PM  % Wound base Black 0% 11/19/2015 12:08 PM  % Wound base Other (Comment) 0% 11/19/2015 12:08 PM  Peri-wound Assessment Intact 11/19/2015 12:08 PM  Wound Length (cm) 0.4 cm 11/03/2015 11:00 AM  Wound Width (cm) 3.5 cm 11/03/2015 11:00 AM  Wound Depth (cm) 1.8 cm 11/03/2015 11:00 AM  Undermining (cm) Undermining 4:00 - 7:00 .5 cm. 11/03/2015 11:00 AM  Margins Unattached edges (unapproximated) 11/19/2015 12:08 PM  Closure None 11/19/2015 12:08 PM  Drainage Amount Minimal 11/19/2015 12:08 PM  Drainage Description Serosanguineous;Purulent 11/19/2015 12:08 PM  Non-staged Wound Description Not applicable 3/90/3009 23:30 PM  Treatment Hydrotherapy (Pulse lavage);Packing (Impregnated strip) 11/19/2015 12:08 PM      Hydrotherapy Pulsed lavage therapy - wound location: lt and rt hand Pulsed Lavage with Suction (psi): 8 psi Pulsed Lavage with Suction - Normal Saline Used: 1000 mL Pulsed Lavage Tip: Tip with splash shield   Wound Assessment and Plan  Wound Therapy - Assess/Plan/Recommendations Wound Therapy - Clinical Statement: Wounds look good and are healing well. Spoke with Dr. Fredna Dow office and agreed to [employer] and nursing to perform dressing changes.  Wound Therapy - Functional Problem List: Decreased strength/AROM of hands/fingers for ADL's and fine motor tasks.  Factors  Delaying/Impairing Wound Healing: Substance abuse;Multiple medical problems Hydrotherapy Plan: Debridement;Dressing change;Patient/family education;Pulsatile lavage with suction Wound Therapy - Frequency: 6X / week Wound Therapy - Follow Up Recommendations: Other (comment) (dressing changes by nursing) Wound Plan: Pt dc'd from hydrotherapy and nursing to perform dressing changes.   Wound Therapy Goals- Improve the function of patient's integumentary system by progressing the wound(s) through the phases of wound healing (inflammation - proliferation - remodeling) by: Decrease Necrotic Tissue to: 0 Decrease Necrotic Tissue - Progress: Met Increase Granulation Tissue to: 100 Increase Granulation Tissue - Progress: Met Patient/Family will be able to : complete dressing changes independently prior to d/c Patient/Family Instruction Goal - Progress: Discontinued (comment) (pt will be in facility after dc)  Goals will be updated until maximal potential achieved or discharge criteria met.  Discharge criteria: when goals achieved, discharge from hospital, MD decision/surgical intervention, no progress towards goals, refusal/missing three consecutive treatments without notification or medical reason.  GP     Aldene Hendon 11/19/2015, 12:20 PM [facility] PT [phone]

## 2015-11-19 NOTE — Progress Notes (Signed)
Attempted to give medications. Would only take by mouth methadone. Would not allow me to give anything IV. Holding a fork and knife from tray threatening me with it to back away from him.

## 2015-11-19 NOTE — Progress Notes (Signed)
13 Days Post-Op Procedure(s) (LRB): IRRIGATION AND DEBRIDEMENT HIP (Right) Subjective: "I don't feel right, something is wrong with me?" Denies pleuritic CP  Objective: Vital signs in last 24 hours: Temp:  [97.4 F (36.3 C)-101.9 F (38.8 C)] 97.4 F (36.3 C) (07/24 1720) Pulse Rate:  [70-155] 117 (07/24 1720) Cardiac Rhythm: Sinus tachycardia (07/24 1718) Resp:  [16-28] 19 (07/24 1720) BP: (113-159)/(62-87) 116/74 (07/24 1720) SpO2:  [94 %-97 %] 94 % (07/24 1720) Weight:  [151 lb 14.4 oz (68.9 kg)-153 lb 3.5 oz (69.5 kg)] 153 lb 3.5 oz (69.5 kg) (07/24 1720)  Hemodynamic parameters for last 24 hours:    Intake/Output from previous day: 07/23 0701 - 07/24 0700 In: 870 [P.O.:620; IV Piggyback:250] Out: 1175 [Urine:1175] Intake/Output this shift: Total I/O In: 10 [I.V.:10] Out: 245 [Urine:245]  General appearance: alert, cooperative and no distress Neurologic: anxious, no focal deficit Heart: tachy Lungs: diminished breath sounds bibasilar right > left Wound: VAC in place  Lab Results:  Recent Labs  11/18/15 0410 11/19/15 0500  WBC 6.0 9.5  HGB 9.0* 9.4*  HCT 27.3* 28.2*  PLT 336 320   BMET:  Recent Labs  11/18/15 0410 11/19/15 0500  NA 132* 130*  K 3.5 4.1  CL 99* 97*  CO2 26 25  GLUCOSE 119* 115*  BUN 12 13  CREATININE 0.58* 0.63  CALCIUM 8.5* 8.5*    PT/INR: No results for input(s): LABPROT, INR in the last 72 hours. ABG    Component Value Date/Time   PHART 7.446 10/29/2015 0823   HCO3 37.7 (H) 10/29/2015 0823   TCO2 39 10/29/2015 0823   ACIDBASEDEF 1.0 10/22/2015 1832   O2SAT 100.0 10/29/2015 0823   CBG (last 3)   Recent Labs  11/19/15 1240  GLUCAP 135*    Assessment/Plan: S/P Procedure(s) (LRB): IRRIGATION AND DEBRIDEMENT HIP (Right) -  25 yo with multiple abscesses secondary to disseminated MRSA. Now has a right pleural effusion on CXR/ CT.  This is a moderate effusion. There are several options  1. Observation  2.  Thoracentesis- unlikely to completely drain but would allow fluid for analysis  3.Trial of steroids- would clear with ID given infectious issues  4. Chest tube placement. Again would be unlikely to completely drain but potentially could instill thrombolytics which might help.  5. VATS for drainage.   At this point I do not think there is a need for VATS, although could change over time if effusion worsens.    LOS: 28 days    Loreli Slot 11/19/2015

## 2015-11-19 NOTE — Progress Notes (Signed)
Patient wavering with AMS. Becoming more paranoid and agitated. Running fever of 101.8 with HR 130's. B/P stable. All morning very paranoid and not allowing me to give him all of his meds. He threaten to throw urine on staff and spit on [place] asking "Do you want this disease".  Disconnected his PICC several times not allowing me to reconnect nor the IV nurse to draw blood from it. Did finally allow NT to recheck temperature which came down to 98.8. Will continue to monitor and try to care for him as much as he will allow.

## 2015-11-19 NOTE — Progress Notes (Signed)
    [facility]   Reason for visit: called back to see patient due to concerns of new infection  Interval History: no new complaints; febrile; denies pain  CT independently reviewed and effusion noted.   Physical Exam: Constitutional:  Vitals:   11/19/15 1223 11/19/15 1254  BP:  115/62  Pulse: (!) 138 (!) 133  Resp: (!) 28   Temp: (!) 101.9 F (38.8 C)    awake, alert Eyes: anicteric HENT: no thrush Respiratory: Normal respiratory effort; CTA B Cardiovascular: RRR GI: soft, nt, nd  Review of Systems: Constitutional: negative for anorexia Gastrointestinal: negative for diarrhea  Lab Results  Component Value Date   WBC 9.5 11/19/2015   HGB 9.4 (L) 11/19/2015   HCT 28.2 (L) 11/19/2015   MCV 80.1 11/19/2015   PLT 320 11/19/2015    Lab Results  Component Value Date   CREATININE 0.63 11/19/2015   BUN 13 11/19/2015   NA 130 (L) 11/19/2015   K 4.1 11/19/2015   CL 97 (L) 11/19/2015   CO2 25 11/19/2015    Lab Results  Component Value Date   ALT 31 11/06/2015   AST 55 (H) 11/06/2015   ALKPHOS 128 (H) 11/06/2015     Microbiology: No results found for this or any previous visit (from the past 240 hour(s)).  Impression/Plan:  1. Fever - new effusion, no other source.  Possible empyema vs nosocomial infection.  On ceftaroline. Could be aspiration related.  I will broaden, going back to vancomycin for mrsa and zosyn for aspiration, Pseudomonas, pending any cultures. 2.  Disseminated MRSA infection - has been on teflaro.  Will use vancomycin again for now.

## 2015-11-20 ENCOUNTER — Inpatient Hospital Stay (HOSPITAL_COMMUNITY): Payer: Medicaid Other

## 2015-11-20 DIAGNOSIS — J948 Other specified pleural conditions: Secondary | ICD-10-CM

## 2015-11-20 DIAGNOSIS — J90 Pleural effusion, not elsewhere classified: Secondary | ICD-10-CM

## 2015-11-20 LAB — CBC
HEMATOCRIT: 25.9 % — AB (ref 39.0–52.0)
Hemoglobin: 8.5 g/dL — ABNORMAL LOW (ref 13.0–17.0)
MCH: 26.6 pg (ref 26.0–34.0)
MCHC: 32.8 g/dL (ref 30.0–36.0)
MCV: 80.9 fL (ref 78.0–100.0)
Platelets: 221 10*3/uL (ref 150–400)
RBC: 3.2 MIL/uL — ABNORMAL LOW (ref 4.22–5.81)
RDW: 15.1 % (ref 11.5–15.5)
WBC: 4.6 10*3/uL (ref 4.0–10.5)

## 2015-11-20 LAB — BODY FLUID CELL COUNT WITH DIFFERENTIAL
EOS FL: 2 %
Lymphs, Fluid: 91 %
Monocyte-Macrophage-Serous Fluid: 5 % — ABNORMAL LOW (ref 50–90)
Neutrophil Count, Fluid: 2 % (ref 0–25)
Total Nucleated Cell Count, Fluid: 455 cu mm (ref 0–1000)

## 2015-11-20 LAB — BASIC METABOLIC PANEL
Anion gap: 7 (ref 5–15)
BUN: 13 mg/dL (ref 6–20)
CALCIUM: 8.3 mg/dL — AB (ref 8.9–10.3)
CO2: 25 mmol/L (ref 22–32)
CREATININE: 0.52 mg/dL — AB (ref 0.61–1.24)
Chloride: 98 mmol/L — ABNORMAL LOW (ref 101–111)
GFR calc Af Amer: >60 mL/min (ref >60)
GFR calc non Af Amer: >60 mL/min (ref >60)
GLUCOSE: 102 mg/dL — AB (ref 65–99)
Potassium: 3.5 mmol/L (ref 3.5–5.1)
Sodium: 130 mmol/L — ABNORMAL LOW (ref 135–145)

## 2015-11-20 LAB — PROTEIN, BODY FLUID: Total protein, fluid: 6.3 g/dL

## 2015-11-20 LAB — GLUCOSE, SEROUS FLUID: Glucose, Fluid: 88 mg/dL

## 2015-11-20 LAB — LACTATE DEHYDROGENASE, PLEURAL OR PERITONEAL FLUID: LD FL: 192 U/L — AB (ref 3–23)

## 2015-11-20 NOTE — Progress Notes (Signed)
Dr. Sunnie Nielsen is aware that in order for patient to get his MRI done that he will definitely need General Anesthesia. Patients last MRI's all were motion degraded, pt was sedated by RN on all occasions and it did not work.
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
## 2015-11-20 NOTE — Progress Notes (Signed)
Occupational Therapy Treatment Patient Details Name: Jace Fermin MRN: [id number] DOB: [birth year] Today's Date: 11/20/2015    History of present illness Pt adm with disseminated MRSA bacteremia. Pt with repeated treatment with surgical drainage of large abscesses including bil hands, bil ankles, sternum, and rt hip. Pt intubated 6/26. Self extubated and then reintubated. Trach decannulated and sternal wound vac placed 11/14/15. PMH - IV drug abuse.    OT comments  Soft tissue mobilization with Passive and active stretch performed Rt hand.  Requires mod cues for exercise program.    Follow Up Recommendations  SNF;Supervision/Assistance - 24 hour    Equipment Recommendations  None recommended by OT    Recommendations for Other Services      Precautions / Restrictions Precautions Precautions: Fall Precaution Comments: watch HR Restrictions Weight Bearing Restrictions: Yes RLE Weight Bearing: Weight bearing as tolerated LLE Weight Bearing: Weight bearing as tolerated       Mobility Bed Mobility                  Transfers Overall transfer level: Needs assistance Equipment used: Rolling walker (2 wheeled) Transfers: Sit to/from Stand Sit to Stand: Min assist;+2 physical assistance;+2 safety/equipment         General transfer comment: from recliner; poor use of Rt hand due to pain    Balance   Sitting-balance support: No upper extremity supported;Feet supported Sitting balance-Leahy Scale: Good     Standing balance support: Bilateral upper extremity supported Standing balance-Leahy Scale: Poor                     ADL                                                Vision                     Perception     Praxis      Cognition   Behavior During Therapy: Flat affect Overall Cognitive Status: No family/caregiver present to determine baseline cognitive functioning                       Extremity/Trunk  Assessment               Exercises Other Exercises Other Exercises: Soft tissue mobilization performed Rt hand followed by passive stretch and active stretch.  MCPs are tight with limited flexion which improved after stretch.  Little finger with only ~50* MCP flexion and limited composite flexion, mild piling beginning.      Shoulder Instructions       General Comments      Pertinent Vitals/ Pain       Pain Assessment: Faces Faces Pain Scale: Hurts little more Pain Location: Rt hand with ROM  Pain Descriptors / Indicators: Grimacing Pain Intervention(s): Monitored during session  Home Living                                          Prior Functioning/Environment              Frequency Min 3X/week     Progress Toward Goals  OT Goals(current goals can now be found in the care plan section)  Progress towards OT  goals: Progressing toward goals  Acute Rehab OT Goals Patient Stated Goal: to get a job and to possibly move in with his father in [place] ADL Goals Pt Will Perform Eating: with set-up;sitting Pt Will Perform Grooming: with set-up;sitting Pt Will Transfer to Toilet: with supervision;bedside commode;ambulating Pt Will Perform Toileting - Clothing Manipulation and hygiene: with min assist;sit to/from stand Pt/caregiver will Perform Home Exercise Program: Increased ROM;Both right and left upper extremity;With theraputty;With written HEP provided;With Supervision  Plan Discharge plan remains appropriate    Co-evaluation                 End of Session     Activity Tolerance Patient tolerated treatment well   Patient Left in bed;with call bell/phone within reach   Nurse Communication          Time: 5784-6962 OT Time Calculation (min): 37 min  Charges: OT General Charges $OT Visit: 1 Procedure OT Treatments $Therapeutic Exercise: 23-37 mins  Morrisa Aldaba M 11/20/2015, 4:31 PM

## 2015-11-20 NOTE — Progress Notes (Signed)
Physical Therapy Treatment Patient Details Name: Kevin Austin MRN: [id number] DOB: 07/03/1990 Today's Date: 11/20/2015    History of Present Illness Pt adm with disseminated MRSA bacteremia. Pt with repeated treatment with surgical drainage of large abscesses including bil hands, bil ankles, sternum, and rt hip. Pt intubated 6/26. Self extubated and then reintubated. Trach decannulated and sternal wound vac placed 11/14/15. PMH - IV drug abuse.     PT Comments    Patient anxious to walk. HR 125-130 while walking (improved). Pt requires assist to maneuver RW due to limited use of Rt hand (may benefit from Rt platform). Continued staggering posterior LOB with up to mod assist to recover.    Follow Up Recommendations  SNF     Equipment Recommendations  Rolling walker with 5" wheels    Recommendations for Other Services       Precautions / Restrictions Precautions Precautions: Fall Precaution Comments: watch HR Restrictions Weight Bearing Restrictions: Yes RLE Weight Bearing: Weight bearing as tolerated LLE Weight Bearing: Weight bearing as tolerated    Mobility  Bed Mobility                  Transfers Overall transfer level: Needs assistance Equipment used: Rolling walker (2 wheeled) Transfers: Sit to/from Stand Sit to Stand: Min assist;+2 physical assistance;+2 safety/equipment         General transfer comment: from recliner; poor use of Rt hand due to pain  Ambulation/Gait Ambulation/Gait assistance: Mod assist;+2 safety/equipment Ambulation Distance (Feet): 27 Feet Assistive device: Rolling walker (2 wheeled) Gait Pattern/deviations: Step-through pattern;Decreased stride length;Shuffle;Leaning posteriorly   Gait velocity interpretation: Below normal speed for age/gender General Gait Details: staggering posteriorly x 3 required assist to maintain balance; Poor use of Rt hand on RW due to pain; ?will need platform   Stairs            Wheelchair  Mobility    Modified Rankin (Stroke Patients Only)       Balance   Sitting-balance support: No upper extremity supported;Feet supported Sitting balance-Leahy Scale: Good     Standing balance support: Bilateral upper extremity supported Standing balance-Leahy Scale: Poor                      Cognition Arousal/Alertness: Awake/alert Behavior During Therapy: Flat affect Overall Cognitive Status: No family/caregiver present to determine baseline cognitive functioning (perseverating on pain, repeating questions)                      Exercises      General Comments        Pertinent Vitals/Pain Pain Assessment: Faces Faces Pain Scale: Hurts little more Pain Location: rt hand with use of RW Pain Descriptors / Indicators: Guarding Pain Intervention(s): Limited activity within patient's tolerance;Monitored during session;Patient requesting pain meds-RN notified;Repositioned    Home Living                      Prior Function            PT Goals (current goals can now be found in the care plan section) Acute Rehab PT Goals Patient Stated Goal: to get a job and to possibly move in with his father in [place] Time For Goal Achievement: 11/22/15 Progress towards PT goals: Progressing toward goals    Frequency  Min 3X/week    PT Plan Current plan remains appropriate    Co-evaluation  End of Session Equipment Utilized During Treatment: Gait belt Activity Tolerance: Patient tolerated treatment well Patient left: in bed;with call bell/phone within reach;Other (comment) (CCM in to perform thoracentesis)     Time: 1203-1222 PT Time Calculation (min) (ACUTE ONLY): 19 min  Charges:  $Gait Training: 8-22 mins                    G Codes:      Kevin Austin December 09, 2015, 12:47 PM  Pager [phone]

## 2015-11-20 NOTE — Consult Note (Addendum)
WOC Nurse wound follow up BLE and hip wounds are followed by ortho service for assessment and plan of care, left hand is followed by hand surgery team.  Refer to previous progress notes on 7/19.  Bedside nurses have been ordered to change Vac dressing Q M/W/F but this has not been performed since 7/19.  Assessed wound during dressing change with Dr Dorris Fetch.  Wound has greatly improved since previous assessment.  Reason for Consult: Wound Vac dressing change with CT service for assessment and plan of care. Wound type: Full thickness post-op wound to midline sternum Measurement: 6X4.5X.3cm Wound bed: 100% beefy red, bone palpable Drainage (amount, consistency, odor) small amt pink drainage, some air leakage audible from inner wound bed Periwound: intact skin surrounding Dressing procedure/placement/frequency: Applied Mepitel contact layer to protect the inner wound bed and one piece black foam to cont suction.  Pt tolerated with mod amt discomfort. Plan for bedside nurse to change this Friday and then Q M/W/F. Please re-consult if further assistance is needed.  Thank-you,  Cammie Mcgee MSN, RN, CWOCN, [place], CNS [phone]

## 2015-11-20 NOTE — Progress Notes (Signed)
    [facility]   Reason for visit: called back to see patient due to concerns of new infection  Interval History: no new complaints; feels better today; less agitated and no paranoia with increase in methadone, ativan   Physical Exam: Constitutional:  Vitals:   11/20/15 0400 11/20/15 0825  BP: 114/76 109/67  Pulse:  (!) 116  Resp: 16 18  Temp:  98.2 F (36.8 C)   awake, alert, calm Eyes: anicteric HENT: no thrush Respiratory: Normal respiratory effort; CTA B Cardiovascular: RRR GI: soft, nt, nd  Review of Systems: Constitutional: negative for anorexia Gastrointestinal: negative for diarrhea  Lab Results  Component Value Date   WBC 4.6 11/20/2015   HGB 8.5 (L) 11/20/2015   HCT 25.9 (L) 11/20/2015   MCV 80.9 11/20/2015   PLT 221 11/20/2015    Lab Results  Component Value Date   CREATININE 0.52 (L) 11/20/2015   BUN 13 11/20/2015   NA 130 (L) 11/20/2015   K 3.5 11/20/2015   CL 98 (L) 11/20/2015   CO2 25 11/20/2015    Lab Results  Component Value Date   ALT 31 11/06/2015   AST 55 (H) 11/06/2015   ALKPHOS 128 (H) 11/06/2015     Microbiology: No results found for this or any previous visit (from the past 240 hour(s)).  Impression/Plan:  1. Fever - new effusion.  Also with withdrawal and improved rapidly with treatment.  Doubt new infection. I will stop zosyn. 2.  Disseminated MRSA infection - has been on teflaro. Changed to vancomycin with concern for #1 to broaden.  I will continue with this.  Vancomycin through 8/22 and then oral doxycycline after that.    I will sign off, please call with any questions. thanks
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
## 2015-11-21 ENCOUNTER — Inpatient Hospital Stay (HOSPITAL_COMMUNITY): Payer: Medicaid Other

## 2015-11-21 DIAGNOSIS — L0291 Cutaneous abscess, unspecified: Secondary | ICD-10-CM

## 2015-11-21 LAB — FUNGUS CULTURE RESULT

## 2015-11-21 LAB — FUNGAL ORGANISM REFLEX

## 2015-11-21 LAB — FUNGUS CULTURE WITH STAIN

## 2015-11-21 LAB — PROCALCITONIN: Procalcitonin: 0.12 ng/mL

## 2015-11-21 LAB — PH, BODY FLUID: pH, Body Fluid: 8

## 2015-11-21 MED ORDER — GADOBENATE DIMEGLUMINE 529 MG/ML IV SOLN
15.0000 mL | Freq: Once | INTRAVENOUS | Status: AC
Start: 2015-11-21 — End: 2015-11-21
  Administered 2015-11-21: 15 mL via INTRAVENOUS

## 2015-11-21 NOTE — Plan of Care (Signed)
Problem: Activity: Goal: Risk for activity intolerance will decrease Outcome: Progressing Patient scheduled to work with PT. Walked in hallway today and up to chair for 2 hours.   Problem: Nutrition: Goal: Adequate nutrition will be maintained Outcome: Progressing Patient able to feed self and eats greater than 75 % of all meals today.  Problem: Bowel/Gastric: Goal: Will not experience complications related to bowel motility Outcome: Progressing Patient receiving Miralax daily to assist with regularity. Also encouraged to eat fibrous foods and be active. Monitoring for continued regularity.

## 2015-11-21 NOTE — Progress Notes (Signed)
Patient asked for this RN to sit with him because he was afraid he was going to die. RN sat in the room while he ate a snack and talked. Pt mentioned that he was feeling remorse of past actions (his IV drug use). Pt was tearful. The Chaplan on call was called, however, this RN had given him his scheduled Ativan prior to the Chaplain's arrival. The patient was too sleepy to keep steady conversation. Chaplain requested a spiritual consult to be ordered, so someone can come in the morning. Pt is currently sleepy. Will continue to monitor.

## 2015-11-21 NOTE — Anesthesia Postprocedure Evaluation (Signed)
Anesthesia Post Note  Patient: Kevin Austin  Procedure(s) Performed: Procedure(s) (LRB): IRRIGATION AND DEBRIDEMENT HIP (Right)  Patient location during evaluation: SICU Anesthesia Type: General Level of consciousness: sedated Pain management: pain level controlled Vital Signs Assessment: post-procedure vital signs reviewed and stable Respiratory status: patient remains intubated per anesthesia plan Cardiovascular status: stable Anesthetic complications: no    Last Vitals:  Vitals:   11/21/15 0327 11/21/15 0400  BP:  110/72  Pulse: (!) 111 (!) 108  Resp: 11 (!) 21  Temp: 36.3 C     Last Pain:  Vitals:   11/21/15 0622  TempSrc:   PainSc: 10-Worst pain ever                 Kennieth Rad

## 2015-11-21 NOTE — Progress Notes (Signed)
Physical Therapy Treatment Patient Details Name: Kevin Austin MRN: [id number] DOB: 11/20/1990 Today's Date: 11/21/2015    History of Present Illness Pt adm with disseminated MRSA bacteremia. Pt with repeated treatment with surgical drainage of large abscesses including bil hands, bil ankles, sternum, and rt hip. Pt intubated 6/26. Self extubated and then reintubated. Trach decannulated and sternal wound vac placed 11/14/15. PMH - IV drug abuse.     PT Comments    Patient looks better, however continues to move and cognitively process very slowly. Continues to request assistance for tasks he can perform and requires max cues to complete these tasks himself (with complaints). Tachycardia continues (112 at rest; 138 max with ambulation). No respiratory distress.   Follow Up Recommendations  SNF     Equipment Recommendations  Rolling walker with 5" wheels    Recommendations for Other Services       Precautions / Restrictions Precautions Precautions: Fall Precaution Comments: watch HR Restrictions Weight Bearing Restrictions: Yes RLE Weight Bearing: Weight bearing as tolerated LLE Weight Bearing: Weight bearing as tolerated    Mobility  Bed Mobility Overal bed mobility: Needs Assistance Bed Mobility: Rolling;Sidelying to Sit Rolling: Min assist Sidelying to sit: Min assist       General bed mobility comments: max cues as pt trying to pull up from supine without success; required max cues (multi-modal)  Transfers Overall transfer level: Needs assistance Equipment used: Rolling walker (2 wheeled) Transfers: Sit to/from Stand Sit to Stand: Min assist;+2 safety/equipment         General transfer comment: vc to use Rt hand with fingers flexed to push up off bed; leans to his left with assist to gain balance  Ambulation/Gait Ambulation/Gait assistance: Min assist;+2 safety/equipment Ambulation Distance (Feet): 230 Feet Assistive device: Rolling walker (2 wheeled) Gait  Pattern/deviations: Step-through pattern;Decreased stride length;Decreased dorsiflexion - right;Antalgic;Decreased weight shift to right;Drifts right/left   Gait velocity interpretation: Below normal speed for age/gender General Gait Details: limited DF on Rt causing short left step length, poor weight shift to Rt; sarge degree of drifting Lt or Rt as pt distracted and looking into rooms   Stairs            Wheelchair Mobility    Modified Rankin (Stroke Patients Only)       Balance   Sitting-balance support: No upper extremity supported;Feet supported Sitting balance-Leahy Scale: Good     Standing balance support: No upper extremity supported Standing balance-Leahy Scale: Poor Standing balance comment: weak with legs trembling                    Cognition Arousal/Alertness: Awake/alert Behavior During Therapy: Flat affect Overall Cognitive Status: Impaired/Different from baseline (perseverating on pain, repeating questions) Area of Impairment: Attention;Memory;Following commands;Problem solving   Current Attention Level: Sustained (easily distracted in hallway) Memory: Decreased short-term memory Following Commands: Follows multi-step commands inconsistently;Follows multi-step commands with increased time (gets distracted and forgets what to do)     Problem Solving: Slow processing;Decreased initiation;Difficulty sequencing;Requires verbal cues;Requires tactile cues General Comments: Skips from topic to topic; tangential;     Exercises General Exercises - Lower Extremity Ankle Circles/Pumps: AROM;Both;20 reps    General Comments General comments (skin integrity, edema, etc.): Patient attempts to get staff to perform tasks for him that he should do ("lift me up" "hold me up" "hold this (urinal) for me"). Wanted to return to bed. Educated re: benefits of OOB and risks of inactivity. Encouraged use of Rt hand throughout session (gripping RW  better, rotating  grip)      Pertinent Vitals/Pain Pain Assessment: No/denies pain    Home Living                      Prior Function            PT Goals (current goals can now be found in the care plan section) Acute Rehab PT Goals Patient Stated Goal: to get a job and to possibly move in with his father in [place] Time For Goal Achievement: 11/22/15 Progress towards PT goals: Progressing toward goals    Frequency  Min 3X/week    PT Plan Current plan remains appropriate    Co-evaluation             End of Session Equipment Utilized During Treatment: Gait belt Activity Tolerance: Patient tolerated treatment well Patient left: with call bell/phone within reach;in chair;with chair alarm set     Time: 1537-9432 PT Time Calculation (min) (ACUTE ONLY): 45 min  Charges:  $Gait Training: 38-52 mins                    G Codes:      Kevin Austin 12/20/15, 10:00 AM  Pager [phone]

## 2015-11-21 NOTE — Procedures (Signed)
Thoracentesis Procedure Note  Pre-operative Diagnosis: pleural effusion   Post-operative Diagnosis: same  Indications: evaluation of fluid   Procedure Details  Consent: Informed consent was obtained. Risks of the procedure were discussed including: infection, bleeding, pain, pneumothorax.  Under sterile conditions the patient was positioned. Betadine solution and sterile drapes were utilized.  1% buffered lidocaine was used to anesthetize the rib space which was identified via real time [place]. Fluid was obtained without any difficulties and minimal blood loss.  A dressing was applied to the wound and wound care instructions were provided.   Findings 350 ml of bloody pleural fluid was obtained. A sample was sent to Pathology for cytogenetics, flow, and cell counts, as well as for infection analysis.  Complications:  None; patient tolerated the procedure well.          Condition: stable  Plan A follow up chest x-ray was ordered. Bed Rest for 0 hours. Tylenol 650 mg. for pain. 350 ml sent to lab for analysis   Simonne Martinet ACNP-BC [facility] Pulmonary/Critical Care Pager # [phone] OR # [phone] if no answer

## 2015-11-21 NOTE — Plan of Care (Signed)
Problem: Education: Goal: Knowledge of [place] [facility] Education information/materials will improve Outcome: Completed/Met Date Met: 11/21/15 Patient continues to receive information about facility. Most of this teaching occurred on admission; however, continue to reinforce daily.

## 2015-11-21 NOTE — Progress Notes (Signed)
      [address]       ,[place] [address]             [phone]      BP 104/65 (BP Location: Right Arm)   Pulse 95   Temp 98.1 F (36.7 C) (Oral)   Resp 20   Ht 6' (1.829 m)   Wt 155 lb 10.3 oz (70.6 kg)   SpO2 98%   BMI 21.11 kg/m   CXR improved after thoracentesis  Fluid was exudate- would give consideration to a trial of steroids if OK with ID  Viviann Spare C. Dorris Fetch, MD [facility] [phone]

## 2015-11-21 NOTE — Progress Notes (Signed)
   11/21/15 0600  Clinical Encounter Type  Visited With Patient  Visit Type Spiritual support  Referral From Nurse  Spiritual Encounters  Spiritual Needs Emotional  Stress Factors  Patient Stress Factors Other (Comment) (Fear and anxiety)  Chaplain responded to call from nurse that patient was experiencing feelings of fear and dread about his situation. When chaplain arrived, patient was so groggy from recent dose of Atavan that conversation was not really feasible. Recommended that consult be put in for today for visit. Racquel Arkin, Chaplain
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
## 2015-11-21 NOTE — Progress Notes (Signed)
   11/21/15 1100  Clinical Encounter Type  Visited With Patient;Health care provider  Visit Type Initial;Follow-up;Psychological support;Spiritual support;Social support  Referral From Nurse  Consult/Referral To Chaplain;Physician  Spiritual Encounters  Spiritual Needs Emotional  Stress Factors  Patient Stress Factors None identified;Exhausted   Chaplain stopped by to visit with Pt. However Pt. Was very sleepy. Pt. Said he would like a visit in the afternoon. Chaplain will be more than happy to follow up later.   Tanja Port, Chaplain

## 2015-11-22 LAB — COMPREHENSIVE METABOLIC PANEL
ALBUMIN: 1.9 g/dL — AB (ref 3.5–5.0)
ALK PHOS: 115 U/L (ref 38–126)
ALT: 36 U/L (ref 17–63)
ANION GAP: 8 (ref 5–15)
AST: 32 U/L (ref 15–41)
BILIRUBIN TOTAL: 0.7 mg/dL (ref 0.3–1.2)
BUN: 14 mg/dL (ref 6–20)
CALCIUM: 8.6 mg/dL — AB (ref 8.9–10.3)
CO2: 28 mmol/L (ref 22–32)
Chloride: 99 mmol/L — ABNORMAL LOW (ref 101–111)
Creatinine, Ser: 0.57 mg/dL — ABNORMAL LOW (ref 0.61–1.24)
GLUCOSE: 98 mg/dL (ref 65–99)
Potassium: 4 mmol/L (ref 3.5–5.1)
Sodium: 135 mmol/L (ref 135–145)
TOTAL PROTEIN: 7.6 g/dL (ref 6.5–8.1)

## 2015-11-22 LAB — MAGNESIUM: MAGNESIUM: 1.9 mg/dL (ref 1.7–2.4)

## 2015-11-22 LAB — PROCALCITONIN: Procalcitonin: <0.1 ng/mL

## 2015-11-22 LAB — PATHOLOGIST SMEAR REVIEW

## 2015-11-22 MED ORDER — ALTEPLASE 2 MG IJ SOLR
2.0000 mg | Freq: Once | INTRAMUSCULAR | Status: AC
  Administered 2015-11-22: 2 mg

## 2015-11-22 MED ORDER — SENNOSIDES-DOCUSATE SODIUM 8.6-50 MG PO TABS
2.0000 | ORAL_TABLET | Freq: Two times a day (BID) | ORAL | Status: DC
  Administered 2015-11-22 – 2015-12-17 (×37): 2 via ORAL
  Filled 2015-11-22 (×48): qty 2

## 2015-11-22 NOTE — Progress Notes (Signed)
RN in to draw  Vancomycin trough. Unable to flush or draw blood back from red port. Purple port flusing fine but unable to draw blood back. IV team notified and coming to assess PICC line.

## 2015-11-22 NOTE — Progress Notes (Signed)
CSW continues to follow for eventual SNF placement- pt will need to be weaned off of methadone prior to transfer.  Merlyn Lot, LCSWA Clinical Social Worker [phone]

## 2015-11-22 NOTE — Progress Notes (Addendum)
Pharmacy Antibiotic Note  Kevin Austin is a 25 y.o. male admitted on 10/22/2015 with disseminated MRSA infection.   Vancomycin for disseminated MRSA (blood, resp, wounds, spine), WBC WNL, AF, PCT 0.12,   s/p I&D of L foot, bilateral hand/arm, sternum, hip.  ID signed off 7/25 7/25 CXR R pleural effusion, thoracentesis done 7/25 w/ 350 ml removed Today VT unable to be drawn 7/27 am due to PICC line clotted - getting tpa'd, creat stable at 0.57, had labile VT in past and vanc was changed to teflaro.  Plan: check VT prior to 08 am dose 7/28 at 0730 Continue Vancomycin 1250 mg IV q8h Continue vanc through  8/22 per ID note and then switch to doxycycline for a month afterwards   :Height: 6' (182.9 cm) Weight: 151 lb 3.8 oz (68.6 kg) IBW/kg (Calculated) : 77.6  Temp (24hrs), Avg:97.8 F (36.6 C), Min:97.3 F (36.3 C), Max:98.9 F (37.2 C)   Recent Labs Lab 11/16/15 1100 11/17/15 0410 11/18/15 0410 11/19/15 0500 11/20/15 0619 11/22/15 0555  WBC 7.2 9.3 6.0 9.5 4.6  --   CREATININE 0.59*  --  0.58* 0.63 0.52* 0.57*    Estimated Creatinine Clearance: 139.3 mL/min (by C-G formula based on SCr of 0.8 mg/dL).    Allergies  Allergen Reactions  . Ketorolac Nausea Only    Per [facility] records  . Tramadol Nausea Only    Per [facility] records    Antimicrobials this admission: Rifampin 7/11 > 7/19 Ceftaroline 7/11 >> 7/24 Vanc 6/26 > 7/11, 7/24>> Zosyn 6/26 >>6/27, 7/24>>7/25 Cefepime 6/26 >6/26 Ceftaz 6/27>>6/28  Dose adjustments this admission: 6/28 VT = on 1 g q8h 6/30 VT = 14 on 1250 mg q8h 7/1 VT = 20 on 1500 mg q8h (drawn 3 hrs after dose) 7/2 VT = 17 on 1500 mg q8h 7/5 VT = 34 on 1500 mg q8h 7/6 VR = 10, resume at 1500 mg q12h 7/8 VT = 10 incr to 1750 mg q12h 7/10 VT = 11 incr to 1250 mg q8h 7/27 VT = PICC line clotted and RN and IV team unable to get VT    Microbiology results: 6/26 BCx2: MRSA 6/26 BCID: MRSA 6/26 UCx: neg 6/26 Sputum: MRSA 6/26 MRSA PCR:  pos Multiple Wounds/Abscess = MRSA 6/28 TA: few MRSA 6/28 BCx2: neg 6/28 BCID: MSSA 6/30 BCx2: neg 7/1 ankle fluid: mod MRSA 7/6 sternum abscess: MRSA 7/10 resp cx: MRSA 7/10 blood cx: CoNS in 1/2 7/10: UCx: neg 7/11 hip wound: negF 7/25 R pleural fluid> ngtd  Thank you for allowing pharmacy to be a part of this patient's care.  Herby Abraham, Pharm.D. [phone] 11/22/2015 9:15 AM
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
## 2015-11-23 LAB — HEPATITIS PANEL, ACUTE
HCV Ab: >11 s/co ratio — ABNORMAL HIGH (ref 0.0–0.9)
HEP B C IGM: NEGATIVE
HEP B S AG: NEGATIVE
Hep A IgM: NEGATIVE

## 2015-11-23 LAB — BASIC METABOLIC PANEL
Anion gap: 7 (ref 5–15)
BUN: 12 mg/dL (ref 6–20)
CALCIUM: 8.5 mg/dL — AB (ref 8.9–10.3)
CO2: 29 mmol/L (ref 22–32)
CREATININE: 0.53 mg/dL — AB (ref 0.61–1.24)
Chloride: 98 mmol/L — ABNORMAL LOW (ref 101–111)
GFR calc non Af Amer: >60 mL/min (ref >60)
Glucose, Bld: 89 mg/dL (ref 65–99)
Potassium: 4.1 mmol/L (ref 3.5–5.1)
SODIUM: 134 mmol/L — AB (ref 135–145)

## 2015-11-23 LAB — PROCALCITONIN

## 2015-11-23 LAB — BODY FLUID CULTURE
CULTURE: NO GROWTH
SPECIAL REQUESTS: NORMAL

## 2015-11-23 LAB — VANCOMYCIN, TROUGH: Vancomycin Tr: 18 ug/mL (ref 15–20)

## 2015-11-23 MED ORDER — LORAZEPAM 2 MG/ML IJ SOLN
1.0000 mg | Freq: Four times a day (QID) | INTRAMUSCULAR | Status: DC
  Administered 2015-11-23 – 2015-11-25 (×8): 1 mg via INTRAVENOUS
  Filled 2015-11-23 (×8): qty 1

## 2015-11-23 NOTE — Progress Notes (Signed)
Upon arrival to unit patients 0800 vanc was still hanging with the bag full, bag was discarded and 1600 dose was hung and infused. I marked on the MAR as 0800 dose not given. Minerva Ends
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
## 2015-11-23 NOTE — Progress Notes (Signed)
Occupational Therapy Treatment Patient Details Name: Kevin Austin MRN: [id number] DOB: 08/05/1990 Today's Date: 11/23/2015    History of present illness Pt adm with disseminated MRSA bacteremia. Pt with repeated treatment with surgical drainage of large abscesses including bil hands, bil ankles, sternum, and rt hip. Pt intubated 6/26. Self extubated and then reintubated. Trach decannulated and sternal wound vac placed 11/14/15. PMH - IV drug abuse.    OT comments  Rt hand and wrist ROM and function improving - still with limited composite wrist and finger extension, tightness of Rt little finger improved, but mild piling continues, and tightness of MCPs noted.   Follow Up Recommendations  SNF;Supervision/Assistance - 24 hour    Equipment Recommendations  None recommended by OT    Recommendations for Other Services      Precautions / Restrictions Precautions Precautions: Fall Precaution Comments: watch HR Restrictions Weight Bearing Restrictions: Yes RLE Weight Bearing: Weight bearing as tolerated LLE Weight Bearing: Weight bearing as tolerated       Mobility Bed Mobility Overal bed mobility: Needs Assistance Bed Mobility: Supine to Sit     Supine to sit: Min guard;HOB elevated     General bed mobility comments: Close min guard for safety.  Pt uses bed rail to pull up to sitting.  Transfers Overall transfer level: Needs assistance Equipment used: Rolling walker (2 wheeled) Transfers: Sit to/from Stand Sit to Stand: Min assist         General transfer comment: vc to use Rt hand with fingers flexed to push up off bed; Requires assist due to LOB while standing and sitting    Balance Overall balance assessment: Needs assistance Sitting-balance support: No upper extremity supported;Feet unsupported Sitting balance-Leahy Scale: Good     Standing balance support: No upper extremity supported;During functional activity Standing balance-Leahy Scale: Poor Standing balance  comment: LOB when UEs not supported during transition with sit<>stand                   ADL                                                Vision                     Perception     Praxis      Cognition   Behavior During Therapy: Flat affect Overall Cognitive Status: Impaired/Different from baseline Area of Impairment: Attention;Problem solving   Current Attention Level: Selective Memory: Decreased short-term memory  Following Commands: Follows multi-step commands inconsistently;Follows multi-step commands with increased time (gets distracted and forgets what to do) Safety/Judgement: Decreased awareness of safety   Problem Solving: Slow processing;Difficulty sequencing;Requires verbal cues;Requires tactile cues General Comments: Requires cues to focus on task at time while OOB.    Extremity/Trunk Assessment               Exercises Other Exercises Other Exercises: Pt performed 15 reps AROM wrist extension, 10 reps AAROM composite wrist and finger extension, 10 reps A/AAROM tendon gliding exercises.  Soft tissue mobilization performed to Rt little finger followed by PROM/stretch.  Mild piling continues.  Over stretch provided to digits in flexion with focus on MCP flexion    Shoulder Instructions       General Comments      Pertinent Vitals/ Pain       Pain  Assessment: 0-10 Pain Score: 8  Faces Pain Scale: Hurts little more Pain Location: Rt hand with exercise/stretching  Pain Descriptors / Indicators: Aching Pain Intervention(s): Monitored during session  Home Living                                          Prior Functioning/Environment              Frequency Min 3X/week     Progress Toward Goals  OT Goals(current goals can now be found in the care plan section)  Progress towards OT goals: Progressing toward goals  Acute Rehab OT Goals Patient Stated Goal: none stated ADL Goals Pt Will  Perform Eating: with set-up;sitting Pt Will Perform Grooming: with set-up;sitting Pt Will Transfer to Toilet: with supervision;bedside commode;ambulating Pt Will Perform Toileting - Clothing Manipulation and hygiene: with min assist;sit to/from stand Pt/caregiver will Perform Home Exercise Program: Increased ROM;Both right and left upper extremity;With theraputty;With written HEP provided;With Supervision  Plan Discharge plan remains appropriate    Co-evaluation                 End of Session     Activity Tolerance Patient tolerated treatment well   Patient Left in bed;with call bell/phone within reach   Nurse Communication          Time: 1254-1320 OT Time Calculation (min): 26 min  Charges: OT General Charges $OT Visit: 1 Procedure OT Treatments $Therapeutic Exercise: 23-37 mins  Christana Angelica M 11/23/2015, 2:40 PM

## 2015-11-23 NOTE — Progress Notes (Signed)
Pharmacy Antibiotic Note  Kevin Austin is a 25 y.o. male admitted on 10/22/2015 with disseminated MRSA infection.   Vancomycin for disseminated MRSA (blood, resp, wounds, spine). He had labile VT in past and vanc was changed to teflaro, but now changed back with continued fevers.  Vancomycin trough drawn today was in range at 66mcg/mL.  Plan: -Continue Vancomycin 1250 mg IV q8h -Per ID note from 7/25: Continue vancomycin through and then switch to doxycycline for a month afterwards -Continue to monitory renal function, UOP   :Height: 6' (182.9 cm) Weight: 154 lb 12.2 oz (70.2 kg) IBW/kg (Calculated) : 77.6  Temp (24hrs), Avg:97.9 F (36.6 C), Min:97.3 F (36.3 C), Max:98.2 F (36.8 C)   Recent Labs Lab 11/16/15 1100 11/17/15 0410 11/18/15 0410 11/19/15 0500 11/20/15 0619 11/22/15 0555 11/23/15 0730  WBC 7.2 9.3 6.0 9.5 4.6  --   --   CREATININE 0.59*  --  0.58* 0.63 0.52* 0.57*  --   VANCOTROUGH  --   --   --   --   --   --  18    Estimated Creatinine Clearance: 142.6 mL/min (by C-G formula based on SCr of 0.8 mg/dL).    Allergies  Allergen Reactions  . Ketorolac Nausea Only    Per [facility] records  . Tramadol Nausea Only    Per [facility] records    Antimicrobials this admission: Rifampin 7/11 > 7/19 Ceftaroline 7/11 >> 7/24 Vanc 6/26 > 7/11, 7/24>> Zosyn 6/26 >>6/27, 7/24>>7/25 Cefepime 6/26 >6/26 Ceftaz 6/27>>6/28  Dose adjustments this admission: 6/28 VT = 9 on 1 gq8h 6/30 VT = 14 on 1250 mgq8h 7/1 VT = 20 on 1500 mgq8h(drawn 3 hrs after dose) 7/2 VT = 17 on 1500 mgq8h 7/5 VT = 34 on 1500 mgq8h 7/6 VR = 10, resume at 1500 mg q12h 7/8 VT = 10 incr to 1750 mg q12h 7/10 VT = 11 incr to 1250 mg q8h 7/28 VT = 18 on 1250mg  q8h  Microbiology results: 6/26 BCx2: MRSA 6/26 BCID: MRSA 6/26 UCx: neg 6/26 Sputum: MRSA 6/26 MRSA PCR: pos 6/27 R hand abscess: MRSA 6/27 abscess- fungal: neg 6/28 TA: few MRSA 6/28 BCx2: neg 6/28 BCID: MSSA (?) 6/30  BCx2: neg 7/1 ankle fluid: MRSA 7/6 sternum abscess: MRSA 7/10 resp cx: MRSA 7/10 blood cx: 1/2 CoNS 7/10: UCx: negF 7/11 hip wound: negF 7/25 R pleural fluid: ngtd   Thank you for allowing pharmacy to be a part of this patient's care.  Breonna Gafford D. Chloie Loney, PharmD, BCPS Clinical Pharmacist Pager: [phone] 11/23/2015 9:24 AM

## 2015-11-23 NOTE — Progress Notes (Signed)
Pt transferred to 2w30 at this time.   No c/o pain or s/s of any acute distress.

## 2015-11-23 NOTE — Progress Notes (Signed)
Physical Therapy Treatment Patient Details Name: Kevin Austin MRN: [id number] DOB: 04/08/1991 Today's Date: 11/23/2015    History of Present Illness Pt adm with disseminated MRSA bacteremia. Pt with repeated treatment with surgical drainage of large abscesses including bil hands, bil ankles, sternum, and rt hip. Pt intubated 6/26. Self extubated and then reintubated. Trach decannulated and sternal wound vac placed 11/14/15. PMH - IV drug abuse.     PT Comments    Mr. Domeier is making good progress but continues to require assist with transfers and ambulation due to instability.  He requires encouragement to ambulate farther in hallway as he begins to fatigue.  Pt will benefit from continued skilled PT services to increase functional independence and safety.  Follow Up Recommendations  SNF     Equipment Recommendations  Rolling walker with 5" wheels    Recommendations for Other Services       Precautions / Restrictions Precautions Precautions: Fall Precaution Comments: watch HR Restrictions Weight Bearing Restrictions: Yes RLE Weight Bearing: Weight bearing as tolerated LLE Weight Bearing: Weight bearing as tolerated    Mobility  Bed Mobility Overal bed mobility: Needs Assistance Bed Mobility: Supine to Sit     Supine to sit: Min guard;HOB elevated     General bed mobility comments: Close min guard for safety.  Pt uses bed rail to pull up to sitting.  Transfers Overall transfer level: Needs assistance Equipment used: Rolling walker (2 wheeled) Transfers: Sit to/from Stand Sit to Stand: Min assist         General transfer comment: vc to use Rt hand with fingers flexed to push up off bed; Requires assist due to LOB while standing and sitting  Ambulation/Gait Ambulation/Gait assistance: Min assist Ambulation Distance (Feet): 200 Feet Assistive device: Rolling walker (2 wheeled) Gait Pattern/deviations: Step-through pattern;Decreased stride length;Decreased dorsiflexion  - right;Drifts right/left Gait velocity: decreased   General Gait Details: Cues to remain on task as pt distracted while ambulating in hall.  Min assist to steady at times.  Cues to flex fingers Rt hand for better grip on RW.  HR up to 125.     Stairs            Wheelchair Mobility    Modified Rankin (Stroke Patients Only)       Balance Overall balance assessment: Needs assistance Sitting-balance support: No upper extremity supported;Feet unsupported Sitting balance-Leahy Scale: Good     Standing balance support: No upper extremity supported;During functional activity Standing balance-Leahy Scale: Poor Standing balance comment: LOB when UEs not supported during transition with sit<>stand                    Cognition Arousal/Alertness: Awake/alert Behavior During Therapy: Flat affect Overall Cognitive Status: Impaired/Different from baseline (perseverating on pain, repeating questions) Area of Impairment: Attention;Memory;Following commands;Problem solving;Safety/judgement   Current Attention Level: Selective (distracted in hallway) Memory: Decreased short-term memory Following Commands: Follows multi-step commands inconsistently;Follows multi-step commands with increased time (gets distracted and forgets what to do) Safety/Judgement: Decreased awareness of safety   Problem Solving: Slow processing;Decreased initiation;Difficulty sequencing;Requires verbal cues;Requires tactile cues General Comments: Requires cues to focus on task at time while OOB.    Exercises      General Comments        Pertinent Vitals/Pain Pain Assessment: Faces Faces Pain Scale: Hurts little more Pain Location: Rt hip Pain Descriptors / Indicators: Aching;Discomfort Pain Intervention(s): Limited activity within patient's tolerance;Monitored during session;Repositioned    Home Living  Prior Function            PT Goals (current goals can now be  found in the care plan section) Acute Rehab PT Goals Patient Stated Goal: none stated PT Goal Formulation: With patient Time For Goal Achievement: 12/06/15 Potential to Achieve Goals: Good Progress towards PT goals: Progressing toward goals    Frequency  Min 3X/week    PT Plan Current plan remains appropriate    Co-evaluation             End of Session Equipment Utilized During Treatment: Gait belt Activity Tolerance: Patient tolerated treatment well;Patient limited by fatigue Patient left: with call bell/phone within reach;in chair;with chair alarm set;with nursing/sitter in room     Time: 9381-0175 PT Time Calculation (min) (ACUTE ONLY): 32 min  Charges:  $Gait Training: 23-37 mins                    G Codes:       Encarnacion Chu PT, DPT  Pager: [phone] Phone: [phone] 11/23/2015, 11:44 AM

## 2015-11-23 NOTE — Progress Notes (Signed)
17 Days Post-Op Procedure(s) (LRB): IRRIGATION AND DEBRIDEMENT HIP (Right) Subjective: Feels a little better this Am No chest pain- pleuritic or at wound  Objective: Vital signs in last 24 hours: Temp:  [97.3 F (36.3 C)-98.2 F (36.8 C)] 98 F (36.7 C) (07/28 0806) Pulse Rate:  [93-116] 116 (07/28 0806) Cardiac Rhythm: Normal sinus rhythm (07/28 0755) Resp:  [10-14] 14 (07/28 0806) BP: (97-118)/(64-93) 104/64 (07/28 0806) SpO2:  [97 %-99 %] 97 % (07/28 0806) Weight:  [154 lb 12.2 oz (70.2 kg)] 154 lb 12.2 oz (70.2 kg) (07/28 0400)  Hemodynamic parameters for last 24 hours:    Intake/Output from previous day: 07/27 0701 - 07/28 0700 In: 432 [P.O.:422; I.V.:10] Out: 1725 [Urine:1725] Intake/Output this shift: Total I/O In: -  Out: 400 [Urine:350; Drains:50]  General appearance: alert and cooperative Lungs: diminished breath sounds right base Wound: VAC in place surrounding skin without induration or erythema  Lab Results: No results for input(s): WBC, HGB, HCT, PLT in the last 72 hours. BMET:  Recent Labs  11/22/15 0555  NA 135  K 4.0  CL 99*  CO2 28  GLUCOSE 98  BUN 14  CREATININE 0.57*  CALCIUM 8.6*    PT/INR: No results for input(s): LABPROT, INR in the last 72 hours. ABG    Component Value Date/Time   PHART 7.446 10/29/2015 0823   HCO3 37.7 (H) 10/29/2015 0823   TCO2 39 10/29/2015 0823   ACIDBASEDEF 1.0 10/22/2015 1832   O2SAT 100.0 10/29/2015 0823   CBG (last 3)  No results for input(s): GLUCAP in the last 72 hours.  Assessment/Plan: S/P Procedure(s) (LRB): IRRIGATION AND DEBRIDEMENT HIP (Right) - afebrile on vanco Continue VAC changes   LOS: 32 days    Loreli Slot 11/23/2015

## 2015-11-23 NOTE — Plan of Care (Signed)
Problem: Health Behavior/Discharge Planning: Goal: Ability to manage health-related needs will improve Outcome: Progressing Pt is very vocal on trying to come and stay off drug use. Pt states that if he goes to SNF he knows he will stay off drugs. Explained that usual failure to comply happens after going back home. Pt states that he is making plans not to go back to drug use

## 2015-11-23 NOTE — Progress Notes (Signed)
Patient doing well, up with PT and walked in the hall. Up to chair currently. Good attitude, vitals are stable. PRN pain medication given at 0830. Good appetite. No questions or concerns at this time.

## 2015-11-24 LAB — CBC
HEMATOCRIT: 23.6 % — AB (ref 39.0–52.0)
Hemoglobin: 7.4 g/dL — ABNORMAL LOW (ref 13.0–17.0)
MCH: 25.8 pg — ABNORMAL LOW (ref 26.0–34.0)
MCHC: 31.4 g/dL (ref 30.0–36.0)
MCV: 82.2 fL (ref 78.0–100.0)
PLATELETS: 279 10*3/uL (ref 150–400)
RBC: 2.87 MIL/uL — ABNORMAL LOW (ref 4.22–5.81)
RDW: 15.6 % — AB (ref 11.5–15.5)
WBC: 3.6 10*3/uL — ABNORMAL LOW (ref 4.0–10.5)

## 2015-11-24 LAB — BASIC METABOLIC PANEL
Anion gap: 7 (ref 5–15)
BUN: 12 mg/dL (ref 6–20)
CALCIUM: 8.3 mg/dL — AB (ref 8.9–10.3)
CO2: 27 mmol/L (ref 22–32)
CREATININE: 0.54 mg/dL — AB (ref 0.61–1.24)
Chloride: 97 mmol/L — ABNORMAL LOW (ref 101–111)
GFR calc non Af Amer: >60 mL/min (ref >60)
GLUCOSE: 99 mg/dL (ref 65–99)
Potassium: 4 mmol/L (ref 3.5–5.1)
Sodium: 131 mmol/L — ABNORMAL LOW (ref 135–145)

## 2015-11-24 LAB — MAGNESIUM: Magnesium: 1.8 mg/dL (ref 1.7–2.4)

## 2015-11-24 MED ORDER — METHADONE HCL 5 MG PO TABS
15.0000 mg | ORAL_TABLET | Freq: Three times a day (TID) | ORAL | Status: DC
  Administered 2015-11-24 – 2015-11-27 (×8): 15 mg via ORAL
  Filled 2015-11-24 (×8): qty 1

## 2015-11-24 NOTE — Progress Notes (Signed)
      [address]       [place] [address]             [phone]      18 Days Post-Op Procedure(s) (LRB): IRRIGATION AND DEBRIDEMENT HIP (Right) Subjective: Chest not  hurting  Objective: Vital signs in last 24 hours: Temp:  [97.6 F (36.4 C)-98.6 F (37 C)] 98.6 F (37 C) (07/29 0512) Pulse Rate:  [62-123] 107 (07/29 0512) Cardiac Rhythm: Sinus tachycardia (07/29 0700) Resp:  [12-18] 18 (07/29 0512) BP: (104-154)/(59-73) 104/59 (07/29 0512) SpO2:  [96 %-100 %] 97 % (07/29 0512) Weight:  [152 lb 12.5 oz (69.3 kg)] 152 lb 12.5 oz (69.3 kg) (07/29 0512)  Hemodynamic parameters for last 24 hours:    Intake/Output from previous day: 07/28 0701 - 07/29 0700 In: 1210 [P.O.:960; IV Piggyback:250] Out: 1401 [Urine:1350; Drains:50; Stool:1] Intake/Output this shift: No intake/output data recorded.  Wound Vac in place, no obvious purulence or cellulitis  Lab Results:  Recent Labs  11/24/15 0500  WBC 3.6*  HGB 7.4*  HCT 23.6*  PLT 279   BMET:  Recent Labs  11/23/15 1249 11/24/15 0500  NA 134* 131*  K 4.1 4.0  CL 98* 97*  CO2 29 27  GLUCOSE 89 99  BUN 12 12  CREATININE 0.53* 0.54*  CALCIUM 8.5* 8.3*    PT/INR: No results for input(s): LABPROT, INR in the last 72 hours. ABG    Component Value Date/Time   PHART 7.446 10/29/2015 0823   HCO3 37.7 (H) 10/29/2015 0823   TCO2 39 10/29/2015 0823   ACIDBASEDEF 1.0 10/22/2015 1832   O2SAT 100.0 10/29/2015 0823   CBG (last 3)  No results for input(s): GLUCAP in the last 72 hours.  Meds Scheduled Meds: . antiseptic oral rinse  7 mL Mouth Rinse BID  . enoxaparin (LOVENOX) injection  40 mg Subcutaneous Q24H  . feeding supplement (ENSURE ENLIVE)  237 mL Oral BID BM  . feeding supplement (PRO-STAT SUGAR FREE 64)  30 mL Oral BID  . LORazepam  1 mg Intravenous Q6H  . methadone  20 mg Oral Q8H  . multivitamin with minerals  1 tablet Oral Daily  . nicotine  21 mg Transdermal Daily  .  pantoprazole  40 mg Oral Daily  . polyethylene glycol  17 g Oral Daily  . QUEtiapine  75 mg Oral Q12H  . senna-docusate  2 tablet Oral BID  . sodium chloride  500 mL Intravenous Once  . sodium chloride flush  10-40 mL Intracatheter Q12H  . vancomycin  1,250 mg Intravenous Q8H  . zolpidem  5 mg Oral Once   Continuous Infusions:  PRN Meds:.sodium chloride, acetaminophen (TYLENOL) oral liquid 160 mg/5 mL, HYDROmorphone, ibuprofen, LORazepam, metoprolol, ondansetron (ZOFRAN) IV, sodium chloride flush  Xrays No results found.  Assessment/Plan: S/P Procedure(s) (LRB): IRRIGATION AND DEBRIDEMENT HIP (Right)  1 wound vac in place, cont M/W/F changes, WBC 3.6, no fevers- cont current abx per ID recs   LOS: 33 days    GOLD,WAYNE E 11/24/2015
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
## 2015-11-25 MED ORDER — LORAZEPAM 2 MG/ML IJ SOLN
0.5000 mg | Freq: Four times a day (QID) | INTRAMUSCULAR | Status: DC
  Administered 2015-11-25 – 2015-11-26 (×3): 0.5 mg via INTRAVENOUS
  Filled 2015-11-25 (×3): qty 1
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
## 2015-11-26 LAB — BASIC METABOLIC PANEL
ANION GAP: 6 (ref 5–15)
BUN: 12 mg/dL (ref 6–20)
CHLORIDE: 96 mmol/L — AB (ref 101–111)
CO2: 29 mmol/L (ref 22–32)
Calcium: 8.7 mg/dL — ABNORMAL LOW (ref 8.9–10.3)
Creatinine, Ser: 0.59 mg/dL — ABNORMAL LOW (ref 0.61–1.24)
GFR calc non Af Amer: >60 mL/min (ref >60)
Glucose, Bld: 112 mg/dL — ABNORMAL HIGH (ref 65–99)
POTASSIUM: 3.9 mmol/L (ref 3.5–5.1)
SODIUM: 131 mmol/L — AB (ref 135–145)

## 2015-11-26 MED ORDER — LORAZEPAM 0.5 MG PO TABS
0.5000 mg | ORAL_TABLET | Freq: Four times a day (QID) | ORAL | Status: DC
  Administered 2015-11-26 (×3): 0.5 mg via ORAL
  Filled 2015-11-26 (×4): qty 1

## 2015-11-26 MED ORDER — LORAZEPAM 0.5 MG PO TABS: 0.5000 mg | ORAL_TABLET | Freq: Every day | ORAL | Status: DC

## 2015-11-26 MED ORDER — LORAZEPAM 0.5 MG PO TABS
0.5000 mg | ORAL_TABLET | Freq: Every evening | ORAL | Status: DC | PRN
  Administered 2015-11-26 – 2015-12-15 (×5): 0.5 mg via ORAL
  Filled 2015-11-26 (×4): qty 1

## 2015-11-26 NOTE — Progress Notes (Signed)
Pharmacy Antibiotic Note  Kevin Austin is a 25 y.o. male admitted on 10/22/2015 with disseminated MRSA infection.   Vancomycin for disseminated MRSA (blood, resp, wounds, spine). He had labile VT in past and vanc was changed to teflaro, but now changed back with continued fevers.  Vancomycin trough on 7/28 therapeutic on current regimen. Will re-check early this week. Afebrile, wbc low 3.6, Renal function stable, good UOP.   Plan: -Continue Vancomycin 1250 mg IV q8h -Per ID note from 7/25: Continue vancomycin through 8/22 and then switch to doxycycline for 1 month -Continue to monitory renal function, UOP, VT prn as indicated   :Height: 6' (182.9 cm) Weight: 149 lb 11.2 oz (67.9 kg) IBW/kg (Calculated) : 77.6  Temp (24hrs), Avg:98.2 F (36.8 C), Min:98 F (36.7 C), Max:98.4 F (36.9 C)   Recent Labs Lab 11/20/15 0619 11/22/15 0555 11/23/15 0730 11/23/15 1249 11/24/15 0500  WBC 4.6  --   --   --  3.6*  CREATININE 0.52* 0.57*  --  0.53* 0.54*  VANCOTROUGH  --   --  18  --   --     Estimated Creatinine Clearance: 137.9 mL/min (by C-G formula based on SCr of 0.8 mg/dL).    Allergies  Allergen Reactions  . Ketorolac Nausea Only    Per [facility] records  . Tramadol Nausea Only    Per [facility] records    Antimicrobials this admission: Rifampin 7/11 > 7/19 Ceftaroline 7/11 >> 7/24 Vanc 6/26 > 7/11, 7/24>> Zosyn 6/26 >>6/27, 7/24>>7/25 Cefepime 6/26 >6/26 Ceftaz 6/27>>6/28  Dose adjustments this admission: 6/28 VT = 9 on 1 gq8h 6/30 VT = 14 on 1250 mgq8h 7/1 VT = 20 on 1500 mgq8h(drawn 3 hrs after dose) 7/2 VT = 17 on 1500 mgq8h 7/5 VT = 34 on 1500 mgq8h 7/6 VR = 10, resume at 1500 mg q12h 7/8 VT = 10 incr to 1750 mg q12h 7/10 VT = 11 incr to 1250 mg q8h 7/28 VT = 18 on 1250mg  q8h  Microbiology results: 6/26 BCx2: MRSA 6/26 BCID: MRSA 6/26 UCx: neg 6/26 Sputum: MRSA 6/26 MRSA PCR: pos 6/27 R hand abscess: MRSA 6/27 abscess- fungal: neg 6/28 TA: few  MRSA 6/28 BCx2: neg 6/28 BCID: MSSA (?) 6/30 BCx2: neg 7/1 ankle fluid: MRSA 7/6 sternum abscess: MRSA 7/10 resp cx: MRSA 7/10 blood cx: 1/2 CoNS 7/10: UCx: negF 7/11 hip wound: negF 7/25 R pleural fluid: ngf   Babs Bertin, PharmD, BCPS Clinical Pharmacist Pager [phone] 11/26/2015 1:59 PM

## 2015-11-26 NOTE — Progress Notes (Addendum)
      [address]       [place] [address]             [phone]      20 Days Post-Op Procedure(s) (LRB): IRRIGATION AND DEBRIDEMENT HIP (Right) Subjective: Feels ok, vac to be changed later today  Objective: Vital signs in last 24 hours: Temp:  [98 F (36.7 C)-98.4 F (36.9 C)] 98 F (36.7 C) (07/31 0511) Pulse Rate:  [110-134] 116 (07/31 0511) Cardiac Rhythm: Sinus tachycardia (07/30 1950) Resp:  [18-20] 18 (07/31 0511) BP: (110-122)/(62-73) 113/66 (07/31 0511) SpO2:  [98 %-100 %] 100 % (07/31 0511) Weight:  [149 lb 11.2 oz (67.9 kg)] 149 lb 11.2 oz (67.9 kg) (07/31 0511)  Hemodynamic parameters for last 24 hours:    Intake/Output from previous day: 07/30 0701 - 07/31 0700 In: 1207 [P.O.:957; IV Piggyback:250] Out: 2300 [Urine:2300] Intake/Output this shift: No intake/output data recorded.  Wound: vac in place - no cellulitis ir purulence noted  Lab Results:  Recent Labs  11/24/15 0500  WBC 3.6*  HGB 7.4*  HCT 23.6*  PLT 279   BMET:  Recent Labs  11/23/15 1249 11/24/15 0500  NA 134* 131*  K 4.1 4.0  CL 98* 97*  CO2 29 27  GLUCOSE 89 99  BUN 12 12  CREATININE 0.53* 0.54*  CALCIUM 8.5* 8.3*    PT/INR: No results for input(s): LABPROT, INR in the last 72 hours. ABG    Component Value Date/Time   PHART 7.446 10/29/2015 0823   HCO3 37.7 (H) 10/29/2015 0823   TCO2 39 10/29/2015 0823   ACIDBASEDEF 1.0 10/22/2015 1832   O2SAT 100.0 10/29/2015 0823   CBG (last 3)  No results for input(s): GLUCAP in the last 72 hours.  Meds Scheduled Meds: . antiseptic oral rinse  7 mL Mouth Rinse BID  . enoxaparin (LOVENOX) injection  40 mg Subcutaneous Q24H  . feeding supplement (ENSURE ENLIVE)  237 mL Oral BID BM  . feeding supplement (PRO-STAT SUGAR FREE 64)  30 mL Oral BID  . LORazepam  0.5 mg Intravenous Q6H  . methadone  15 mg Oral Q8H  . multivitamin with minerals  1 tablet Oral Daily  . nicotine  21 mg Transdermal Daily  .  pantoprazole  40 mg Oral Daily  . polyethylene glycol  17 g Oral Daily  . QUEtiapine  75 mg Oral Q12H  . senna-docusate  2 tablet Oral BID  . sodium chloride  500 mL Intravenous Once  . sodium chloride flush  10-40 mL Intracatheter Q12H  . vancomycin  1,250 mg Intravenous Q8H  . zolpidem  5 mg Oral Once   Continuous Infusions:  PRN Meds:.sodium chloride, acetaminophen (TYLENOL) oral liquid 160 mg/5 mL, HYDROmorphone, ibuprofen, LORazepam, metoprolol, ondansetron (ZOFRAN) IV, sodium chloride flush  Xrays No results found.  Assessment/Plan: S/P Procedure(s) (LRB): IRRIGATION AND DEBRIDEMENT HIP (Right)  1 stable- will try to check wound when vac changed later today    LOS: 35 days    Ravyn Nikkel E 11/26/2015   Addendum: wound observed during dressing change- healing well with 95%pink granulation tissue
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
## 2015-11-26 NOTE — Progress Notes (Signed)
Nutrition Follow-up  DOCUMENTATION CODES:   Not applicable  INTERVENTION:    Continue Ensure Enlive po BID, each supplement provides 350 kcal and 20 grams of protein   Continue 30 ml Prostat BID, each supplement provides 100 kcals and 15 grams protein  NUTRITION DIAGNOSIS:   Increased nutrient needs related to wound healing as evidenced by estimated needs.  Ongoing  GOAL:   Patient will meet greater than or equal to 90% of their needs  Progressing  MONITOR:   PO intake, Supplement acceptance, Labs, Weight trends, Skin, I & O's  ASSESSMENT:   25 year old admitted with severe sepsis, multiple septic emboli. 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  Trach capped on 7/18 and pt decannulated on 11/14/15.   Pt continues on a Regular diet. PO intake 100% per flowsheet records. CWOCN note reviewed >> pt with full thickness post-op wound to midline sternum. S/p thoracentesis 7/25 for pleural effusion. Receiving Ensure Enlive & Prostat liquid protein BID.  Diet Order:  Diet regular Room service appropriate?: Yes; Fluid consistency:: Thin  Skin:  Wound (see comment) (multiple incisions and open wounds, sternal wound vac)  Last BM:  7/24  Height:   Ht Readings from Last 1 Encounters:  11/23/15 6' (1.829 m)    Weight:   Wt Readings from Last 1 Encounters:  11/26/15 149 lb 11.2 oz (67.9 kg)    Ideal Body Weight:  78.2 kg  BMI:  Body mass index is 20.3 kg/m.  Estimated Nutritional Needs:   Kcal:  2300-2500  Protein:  140-160 gm  Fluid:  >2.3 L  EDUCATION NEEDS:   No education needs identified at this time  [name], RD, LDN Pager #: [phone] After-Hours Pager #: [phone]

## 2015-11-26 NOTE — Progress Notes (Signed)
Physical Therapy Treatment Patient Details Name: Kevin Austin MRN: [id number] DOB: [birth year] Today's Date: 11/26/2015    History of Present Illness Pt adm with disseminated MRSA bacteremia. Pt with repeated treatment with surgical drainage of large abscesses including bil hands, bil ankles, sternum, and rt hip. Pt intubated 6/26. Self extubated and then reintubated. Trach decannulated and sternal wound vac placed 11/14/15. PMH - IV drug abuse.     PT Comments    Pt is up to walk after time EOB to get together, lethargic and pulses were more controlled at 117 to 120 end of walk due to limiting his pace.  EKG was ordered to returned him to bed.  Gait limited by meds having just been given, and was drifting toward furniture with PT continually correcting course.  Will continue to follow acutely for his issues of strength and balance with focus on safer navigation of gait in confined spaces.  Follow Up Recommendations  SNF     Equipment Recommendations  Rolling walker with 5" wheels    Recommendations for Other Services OT consult     Precautions / Restrictions Precautions Precautions: Fall Precaution Comments: watch HR Restrictions Weight Bearing Restrictions: Yes RLE Weight Bearing: Weight bearing as tolerated LLE Weight Bearing: Weight bearing as tolerated    Mobility  Bed Mobility Overal bed mobility: Needs Assistance Bed Mobility: Supine to Sit;Sit to Supine     Supine to sit: Min assist Sit to supine: Min assist (to elevate legs to bed)   General bed mobility comments: pt very drowsy once meds began to be more effective  Transfers Overall transfer level: Needs assistance Equipment used: Rolling walker (2 wheeled) Transfers: Sit to/from [employer] Sit to Stand: Mod assist Stand pivot transfers: Min assist;Mod assist       General transfer comment: reminders for sequence and more assist needed to power up due to meds  Ambulation/Gait Ambulation/Gait  assistance: Min assist Ambulation Distance (Feet): 40 Feet Assistive device: Rolling walker (2 wheeled) Gait Pattern/deviations: Step-through pattern;Step-to pattern;Shuffle;Drifts right/left;Wide base of support;Decreased stride length Gait velocity: decreased Gait velocity interpretation: Below normal speed for age/gender General Gait Details: tends to get close to obstacles continually, needed full reminding for safety in room with buckling appearance of knees   Stairs Stairs:  (not  able to attempt)          Wheelchair Mobility    Modified Rankin (Stroke Patients Only)       Balance     Sitting balance-Leahy Scale: Fair Sitting balance - Comments: lists back without much reaction Postural control: Posterior lean Standing balance support: Bilateral upper extremity supported Standing balance-Leahy Scale: Poor                      Cognition Arousal/Alertness: Awake/alert Behavior During Therapy: Flat affect Overall Cognitive Status: Impaired/Different from baseline Area of Impairment: Attention;Following commands;Safety/judgement;Problem solving   Current Attention Level: Selective Memory: Decreased short-term memory;Decreased recall of precautions Following Commands: Follows one step commands with increased time Safety/Judgement: Decreased awareness of safety;Decreased awareness of deficits   Problem Solving: Slow processing;Decreased initiation;Difficulty sequencing;Requires verbal cues General Comments: reminders for determining limitations with narcotic meds    Exercises General Exercises - Lower Extremity Ankle Circles/Pumps: AROM;Both;5 reps Quad Sets: AROM;Both;5 reps Toe Raises:  (declined)    General Comments General comments (skin integrity, edema, etc.): Pt is very inattentive and sleepy after getting IV meds and still having R hip pain.  He is comfortable at vac location on chest  and had to return to bed due to arrival of EKG tech       Pertinent Vitals/Pain Pain Assessment: Faces Faces Pain Scale: Hurts little more Pain Location: R side and hip Pain Descriptors / Indicators: Aching Pain Intervention(s): Monitored during session;Premedicated before session;Repositioned    Home Living                      Prior Function            PT Goals (current goals can now be found in the care plan section) Acute Rehab PT Goals Patient Stated Goal: to walk in the hallway Progress towards PT goals: Progressing toward goals    Frequency  Min 3X/week    PT Plan Current plan remains appropriate    Co-evaluation             End of Session   Activity Tolerance: Patient tolerated treatment well;Patient limited by lethargy Patient left: in bed;with call bell/phone within reach;with nursing/sitter in room     Time: 1017-1052 PT Time Calculation (min) (ACUTE ONLY): 35 min  Charges:  $Gait Training: 8-22 mins $Therapeutic Activity: 8-22 mins                    G Codes:      Ivar Drape 12/17/2015, 11:18 AM    Samul Dada, PT MS Acute Rehab Dept. Number: [facility] [id number] and [facility] [phone]

## 2015-11-27 ENCOUNTER — Inpatient Hospital Stay (HOSPITAL_COMMUNITY): Payer: Medicaid Other

## 2015-11-27 LAB — BASIC METABOLIC PANEL
Anion gap: 6 (ref 5–15)
BUN: 10 mg/dL (ref 6–20)
CHLORIDE: 99 mmol/L — AB (ref 101–111)
CO2: 29 mmol/L (ref 22–32)
Calcium: 8.7 mg/dL — ABNORMAL LOW (ref 8.9–10.3)
Creatinine, Ser: 0.69 mg/dL (ref 0.61–1.24)
Glucose, Bld: 108 mg/dL — ABNORMAL HIGH (ref 65–99)
POTASSIUM: 4.1 mmol/L (ref 3.5–5.1)
SODIUM: 134 mmol/L — AB (ref 135–145)

## 2015-11-27 LAB — CBC
HEMATOCRIT: 23.9 % — AB (ref 39.0–52.0)
Hemoglobin: 7.3 g/dL — ABNORMAL LOW (ref 13.0–17.0)
MCH: 25.2 pg — ABNORMAL LOW (ref 26.0–34.0)
MCHC: 30.5 g/dL (ref 30.0–36.0)
MCV: 82.4 fL (ref 78.0–100.0)
PLATELETS: 298 10*3/uL (ref 150–400)
RBC: 2.9 MIL/uL — AB (ref 4.22–5.81)
RDW: 15.8 % — ABNORMAL HIGH (ref 11.5–15.5)
WBC: 3.2 10*3/uL — AB (ref 4.0–10.5)

## 2015-11-27 LAB — TSH: TSH: 8.006 u[IU]/mL — AB (ref 0.350–4.500)

## 2015-11-27 LAB — MAGNESIUM: Magnesium: 1.8 mg/dL (ref 1.7–2.4)

## 2015-11-27 MED ORDER — ENSURE ENLIVE PO LIQD: 237.0000 mL | Freq: Two times a day (BID) | ORAL | 12 refills | Status: AC

## 2015-11-27 MED ORDER — MAGNESIUM OXIDE 400 (241.3 MG) MG PO TABS
400.0000 mg | ORAL_TABLET | Freq: Every day | ORAL | Status: AC
  Administered 2015-11-27 – 2015-11-28 (×2): 400 mg via ORAL
  Filled 2015-11-27 (×3): qty 1

## 2015-11-27 MED ORDER — VANCOMYCIN HCL 10 G IV SOLR: 1250.0000 mg | Freq: Three times a day (TID) | INTRAVENOUS | 0 refills | Status: DC

## 2015-11-27 MED ORDER — METHADONE HCL 10 MG PO TABS
10.0000 mg | ORAL_TABLET | Freq: Three times a day (TID) | ORAL | Status: DC
  Administered 2015-11-27: 10 mg via ORAL
  Filled 2015-11-27: qty 1

## 2015-11-27 MED ORDER — PRO-STAT SUGAR FREE PO LIQD: 30.0000 mL | Freq: Two times a day (BID) | ORAL | 0 refills | Status: DC

## 2015-11-27 MED ORDER — POLYETHYLENE GLYCOL 3350 17 G PO PACK: 17.0000 g | PACK | Freq: Every day | ORAL | 0 refills | Status: DC

## 2015-11-27 MED ORDER — LORAZEPAM 0.5 MG PO TABS: 0.5000 mg | ORAL_TABLET | Freq: Three times a day (TID) | ORAL | 0 refills | Status: DC | PRN

## 2015-11-27 MED ORDER — METHADONE HCL 10 MG PO TABS
10.0000 mg | ORAL_TABLET | Freq: Two times a day (BID) | ORAL | Status: DC
  Administered 2015-11-27 – 2015-11-28 (×3): 10 mg via ORAL
  Filled 2015-11-27 (×3): qty 1

## 2015-11-27 MED ORDER — ADULT MULTIVITAMIN W/MINERALS CH: 1.0000 | ORAL_TABLET | Freq: Every day | ORAL | 0 refills | Status: DC

## 2015-11-27 MED ORDER — NICOTINE 21 MG/24HR TD PT24: 21.0000 mg | MEDICATED_PATCH | Freq: Every day | TRANSDERMAL | 0 refills | Status: DC

## 2015-11-27 MED ORDER — SENNOSIDES-DOCUSATE SODIUM 8.6-50 MG PO TABS: 2.0000 | ORAL_TABLET | Freq: Two times a day (BID) | ORAL | 0 refills | Status: DC

## 2015-11-27 MED ORDER — LORAZEPAM 0.5 MG PO TABS
0.5000 mg | ORAL_TABLET | Freq: Two times a day (BID) | ORAL | Status: DC
  Administered 2015-11-27 – 2015-12-18 (×42): 0.5 mg via ORAL
  Filled 2015-11-27 (×42): qty 1

## 2015-11-27 MED ORDER — LORAZEPAM 0.5 MG PO TABS
0.5000 mg | ORAL_TABLET | Freq: Three times a day (TID) | ORAL | Status: DC
  Filled 2015-11-27: qty 1

## 2015-11-27 NOTE — Progress Notes (Signed)
Occupational Therapy Treatment Patient Details Name: Kevin Austin MRN: [id number] DOB: 05-Apr-1991 Today's Date: 11/27/2015    History of present illness Pt adm with disseminated MRSA bacteremia. Pt with repeated treatment with surgical drainage of large abscesses including bil hands, bil ankles, sternum, and rt hip. Pt intubated 6/26. Self extubated and then reintubated. Trach decannulated and sternal wound vac placed 11/14/15. PMH - IV drug abuse.    OT comments  Pt continues to make good progress. Pt able to complete ADLs with setup and close min guard provided due to some unsteadiness ambulating. Reviewed HEP including R hard and wrist ROM to improve functioning - pt unable to recall exercises from previous sessions. Pt continues to demonstrate limited composite wrist and finger extension and MCP flexion. OT goals and frequency have been updated (see below for more detail) to reflect pt's progress. Will continue to follow acutely.    Follow Up Recommendations  SNF;Supervision/Assistance - 24 hour    Equipment Recommendations  None recommended by OT    Recommendations for Other Services      Precautions / Restrictions Precautions Precautions: Fall Precaution Comments: watch HR Restrictions Weight Bearing Restrictions: Yes RLE Weight Bearing: Weight bearing as tolerated LLE Weight Bearing: Weight bearing as tolerated       Mobility Bed Mobility Overal bed mobility: Needs Assistance Bed Mobility: Supine to Sit     Supine to sit: Supervision     General bed mobility comments: Supervision for safety. Increased time and effort  Transfers Overall transfer level: Needs assistance Equipment used: Rolling walker (2 wheeled) Transfers: Sit to/from Stand Sit to Stand: Min guard         General transfer comment: Min guard assist for safety. VCs for safe hand placement and to avoid abandoning RW when approaching obstacles in pathway. 2 minor LOB when ambulating from bathroom - no  physical assist required to regain balance.    Balance Overall balance assessment: Needs assistance Sitting-balance support: No upper extremity supported;Feet supported Sitting balance-Leahy Scale: Good     Standing balance support: No upper extremity supported;During functional activity Standing balance-Leahy Scale: Fair Standing balance comment: able to maintain balance without UE support for static standing tasks only                   ADL Overall ADL's : Needs assistance/impaired Eating/Feeding: Set up;Sitting Eating/Feeding Details (indicate cue type and reason): no longer needs adaptive utensils Grooming: Wash/dry hands;Wash/dry face;Oral care;Set up;Standing   Upper Body Bathing: Set up;Sitting   Lower Body Bathing: Minimal assistance;Sit to/from stand   Upper Body Dressing : Set up;Sitting   Lower Body Dressing: Minimal assistance;Sit to/from stand   Toilet Transfer: Nature conservation officer;Ambulation;RW   Toileting- Water quality scientist and Hygiene: Min guard;Sit to/from stand       Functional mobility during ADLs: Electronics engineer     Praxis      Cognition   Behavior During Therapy: [facility] for tasks assessed/performed Overall Cognitive Status: Impaired/Different from baseline Area of Impairment: Following commands;Safety/judgement;Memory     Memory: Decreased short-term memory  Following Commands: Follows one step commands with increased time Safety/Judgement: Decreased awareness of safety;Decreased awareness of deficits   Problem Solving: Slow processing;Decreased initiation;Requires verbal cues      Extremity/Trunk Assessment  Exercises Other Exercises Other Exercises: Pt performed 15 reps AROM wrist extension, 10 reps AAROM composite wrist and finger extension, 10 reps A/AAROM tendon gliding exercises. Over stretch provided to digits in flexion with focus on  MCP flexion    Shoulder Instructions       General Comments      Pertinent Vitals/ Pain       Pain Assessment: Faces Faces Pain Scale: Hurts a little bit Pain Location: R wrist Pain Descriptors / Indicators: Aching Pain Intervention(s): Limited activity within patient's tolerance;Monitored during session;Repositioned;Patient requesting pain meds-RN notified  Home Living                                          Prior Functioning/Environment              Frequency Min 2X/week     Progress Toward Goals  OT Goals(current goals can now be found in the care plan section)  Progress towards OT goals: Goals met and updated - see care plan  Acute Rehab OT Goals Patient Stated Goal: to go to rehab soon OT Goal Formulation: With patient Time For Goal Achievement: 12/11/15 Potential to Achieve Goals: Good ADL Goals Pt Will Perform Eating: sitting;with modified independence Pt Will Perform Grooming: standing;with modified independence Pt Will Perform Upper Body Bathing: standing;with modified independence Pt Will Perform Lower Body Bathing: with modified independence;sit to/from stand Pt Will Transfer to Toilet: with modified independence;ambulating;regular height toilet Pt Will Perform Toileting - Clothing Manipulation and hygiene: with modified independence;sit to/from stand Pt/caregiver will Perform Home Exercise Program: Increased ROM;Increased strength;Both right and left upper extremity;Independently;With written HEP provided  Plan Discharge plan remains appropriate;Frequency needs to be updated    Co-evaluation                 End of Session Equipment Utilized During Treatment: Gait belt;Rolling walker   Activity Tolerance Patient tolerated treatment well   Patient Left in chair;with call bell/phone within reach   Nurse Communication Mobility status        Time: 1004-1030 OT Time Calculation (min): 26 min  Charges: OT General  Charges $OT Visit: 1 Procedure OT Treatments $Self Care/Home Management : 8-22 mins $Therapeutic Exercise: 8-22 mins  Redmond Baseman, OTR/L Pager: [phone] 11/27/2015, 10:42 AM

## 2015-11-27 NOTE — Clinical Social Work Note (Signed)
CSW is working on difficult place bed for patient. CSW continuing to follow for support and discharge needs.  Shonny Charina Fons, LCSW [phone] 
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
## 2015-11-27 NOTE — Progress Notes (Signed)
[address]       [place] [address]             [phone]                 21 Days Post-Op Procedure(s) (LRB): IRRIGATION AND DEBRIDEMENT HIP (Right)  LOS: 36 days   Subjective: Not very talkative, says bowels need to move  Objective: Vital signs in last 24 hours: Patient Vitals for the past 24 hrs:  BP Temp Temp src Pulse Resp SpO2  11/27/15 0627 111/63 100 F (37.8 C) Oral (!) 121 18 95 %  11/26/15 1937 120/72 98.6 F (37 C) Oral (!) 119 18 100 %  11/26/15 1310 113/67 98.4 F (36.9 C) Oral (!) 118 19 99 %    Filed Weights   11/24/15 0512 11/25/15 0510 11/26/15 0511  Weight: 152 lb 12.5 oz (69.3 kg) 149 lb 8 oz (67.8 kg) 149 lb 11.2 oz (67.9 kg)    Hemodynamic parameters for last 24 hours:    Intake/Output from previous day: 07/31 0701 - 08/01 0700 In: 1080 [P.O.:1080] Out: 1475 [Urine:1475] Intake/Output this shift: No intake/output data recorded.  Scheduled Meds: . antiseptic oral rinse  7 mL Mouth Rinse BID  . enoxaparin (LOVENOX) injection  40 mg Subcutaneous Q24H  . feeding supplement (ENSURE ENLIVE)  237 mL Oral BID BM  . feeding supplement (PRO-STAT SUGAR FREE 64)  30 mL Oral BID  . LORazepam  0.5 mg Oral Q8H  . methadone  10 mg Oral Q8H  . multivitamin with minerals  1 tablet Oral Daily  . nicotine  21 mg Transdermal Daily  . pantoprazole  40 mg Oral Daily  . polyethylene glycol  17 g Oral Daily  . QUEtiapine  75 mg Oral Q12H  . senna-docusate  2 tablet Oral BID  . sodium chloride  500 mL Intravenous Once  . sodium chloride flush  10-40 mL Intracatheter Q12H  . vancomycin  1,250 mg Intravenous Q8H  . zolpidem  5 mg Oral Once   Continuous Infusions:  PRN Meds:.sodium chloride, acetaminophen (TYLENOL) oral liquid 160 mg/5 mL, HYDROmorphone, ibuprofen, LORazepam, metoprolol, ondansetron (ZOFRAN) IV, sodium chloride flush  General appearance: cooperative and slowed mentation Neurologic: intact Heart: regular rate and  rhythm, S1, S2 normal, no murmur, click, rub or gallop Lungs: diminished breath sounds bibasilar Abdomen: soft, non-tender; bowel sounds normal; no masses,  no organomegaly Wound: sternal wound vac in place   Lab Results: CBC: Recent Labs  11/27/15 0330  WBC 3.2*  HGB 7.3*  HCT 23.9*  PLT 298   BMET:  Recent Labs  11/26/15 1501 11/27/15 0330  NA 131* 134*  K 3.9 4.1  CL 96* 99*  CO2 29 29  GLUCOSE 112* 108*  BUN 12 10  CREATININE 0.59* 0.69  CALCIUM 8.7* 8.7*    PT/INR: No results for input(s): LABPROT, INR in the last 72 hours.   Radiology No results found.   Assessment/Plan: S/P Procedure(s) (LRB): IRRIGATION AND DEBRIDEMENT HIP (Right) Continue local wound care of sternal incision   Past Surgical History:  Procedure Laterality Date  . I&D EXTREMITY Bilateral 10/23/2015   Procedure: IRRIGATION AND DEBRIDEMENT EXTREMITY/HAND AND ARM;  Surgeon: Betha Loa, MD;  Location: [facility] OR;  Service: Orthopedics;  Laterality: Bilateral;  . I&D EXTREMITY Right 10/27/2015   Procedure: IRRIGATION AND DEBRIDEMENT EXTREMITY;  Surgeon: Kathryne Hitch, MD;  Location: [facility] OR;  Service: Orthopedics;  Laterality: Right;  . I&D EXTREMITY  Right 11/01/2015   Procedure: IRRIGATION AND DEBRIDEMENT OF ANKLE AND SOFT TISSUE;  Surgeon: Sheral Apley, MD;  Location: [facility] OR;  Service: Orthopedics;  Laterality: Right;  . I&D EXTREMITY N/A 11/01/2015   Procedure: IRRIGATION AND DEBRIDEMENT OF STERNUM W/ POSSIBLE WOUND VAC PLACEMENT;  Surgeon: Loreli Slot, MD;  Location[facility] OR;  Service: Vascular;  Laterality: N/A;  . INCISION AND DRAINAGE HIP Right 11/06/2015   Procedure: IRRIGATION AND DEBRIDEMENT HIP;  Surgeon: Sheral Apley, MD;  Location: [facility] OR;  Service: Orthopedics;  Laterality: Right;  . IRRIGATION AND DEBRIDEMENT FOOT Left 10/23/2015   Procedure: IRRIGATION AND DEBRIDEMENT FOOT;  Surgeon: Betha Loa, MD;  Location: [facility] OR;  Service: Orthopedics;  Laterality: Left;    Delight Ovens MD 11/27/2015 12:13 PM

## 2015-11-27 NOTE — NC FL2 (Signed)
[place] MEDICAID FL2 LEVEL OF CARE SCREENING TOOL     IDENTIFICATION  Patient Name: Kevin Austin Birthdate: 1991-04-24 Sex: male Admission Date (Current Location): 10/22/2015  [facility] and [place] Number:  Producer, television/film/video and Address:  [facility], [address] [place], [place] [address]      Provider Number: [id number]  Attending Physician Name and Address:  Albertine Grates, MD  Relative Name and Phone Number:       Current Level of Care: Hospital Recommended Level of Care: Skilled Nursing Facility Prior Approval Number:    Date Approved/Denied:   PASRR Number:    Discharge Plan: SNF    Current Diagnoses: Patient Active Problem List   Diagnosis Date Noted  . Abscess   . Pleural effusion, right   . Stupor   . Chronic respiratory failure with hypoxia (HCC)   . Tracheostomy status (HCC)   . Acute respiratory failure with hypercapnia (HCC)   . Acute respiratory failure with hypoxemia (HCC)   . Bacteremia   . MRSA (methicillin resistant staph aureus) culture positive   . IV drug abuse 11/08/2015  . Septic arthritis of right ankle (HCC) 11/08/2015  . Sacral osteomyelitis (HCC) 11/08/2015  . Abscess of right hand 11/08/2015  . Abscess of left foot 11/08/2015  . Septic arthritis of hip (HCC) 11/07/2015  . Acute pulmonary edema (HCC)   . MRSA bacteremia 10/29/2015  . MRSA pneumonia (HCC)   . Sternal osteomyelitis (HCC)   . Altered mental status   . Encounter for central line placement   . Abscess of left hand   . Acute respiratory failure (HCC) 10/22/2015    Orientation RESPIRATION BLADDER Height & Weight     Self, Time, Situation, Place  Normal Continent Weight: 149 lb 11.2 oz (67.9 kg) Height:  6' (182.9 cm)  BEHAVIORAL SYMPTOMS/MOOD NEUROLOGICAL BOWEL NUTRITION STATUS     (None) Continent  (Regular)  AMBULATORY STATUS COMMUNICATION OF NEEDS Skin   Extensive Assist Verbally Wound Vac (Wound Vac Chest. Incision Closed: RT Arm, LT foot,  RT Ankle, Breast, RT Hip. Wound: RT Hand dorsal ulnar side , RT hand dorsal Radial side. LT Palm )                       Personal Care Assistance Level of Assistance  Feeding Bathing Assistance: Maximum assistance Feeding assistance: Independent Dressing Assistance: Maximum assistance     Functional Limitations Info  Sight, Hearing, Speech Sight Info: Adequate Hearing Info: Adequate Speech Info: Adequate    SPECIAL CARE FACTORS FREQUENCY  PT (By licensed PT), OT (By licensed OT)     PT Frequency: 5/ week OT Frequency: 5/ week            Contractures Contractures Info: Not present    Additional Factors Info  Code Status, Allergies, Psychotropic, Isolation Precautions Code Status Info: FULL Allergies Info: Ketorolac, Tramadol Psychotropic Info: Seroquel   Isolation Precautions Info: MRSA Suctioning Needs: strong cough,low to no suctioning needs   Current Medications (11/27/2015):  This is the current hospital active medication list Current Facility-Administered Medications  Medication Dose Route Frequency Provider Last Rate Last Dose  . 0.9 %  sodium chloride infusion   Intravenous PRN Coralyn Helling, MD 10 mL/hr at 11/25/15 1441    . acetaminophen (TYLENOL) solution 650 mg  650 mg Oral Q6H PRN Darl Householder Masters, RPH   650 mg at 11/19/15 1227  . antiseptic oral rinse (CPC / CETYLPYRIDINIUM CHLORIDE  0.05%) solution 7 mL  7 mL Mouth Rinse BID Rodolph Bong, MD   7 mL at 11/27/15 1000  . enoxaparin (LOVENOX) injection 40 mg  40 mg Subcutaneous Q24H Belkys A Regalado, MD   40 mg at 11/27/15 1120  . feeding supplement (ENSURE ENLIVE) (ENSURE ENLIVE) liquid 237 mL  237 mL Oral BID BM Rodolph Bong, MD   237 mL at 11/27/15 1000  . feeding supplement (PRO-STAT SUGAR FREE 64) liquid 30 mL  30 mL Oral BID Belkys A Regalado, MD   30 mL at 11/27/15 1117  . ibuprofen (ADVIL,MOTRIN) tablet 400 mg  400 mg Oral Q6H PRN Merwyn Katos, MD   400 mg at 11/23/15 1857  . LORazepam  (ATIVAN) tablet 0.5 mg  0.5 mg Oral QHS PRN Albertine Grates, MD   0.5 mg at 11/26/15 2115  . LORazepam (ATIVAN) tablet 0.5 mg  0.5 mg Oral BID Albertine Grates, MD      . magnesium oxide (MAG-OX) tablet 400 mg  400 mg Oral Daily Albertine Grates, MD      . methadone (DOLOPHINE) tablet 10 mg  10 mg Oral Q8H Albertine Grates, MD   10 mg at 11/27/15 1555  . metoprolol (LOPRESSOR) injection 2.5-5 mg  2.5-5 mg Intravenous Q3H PRN Belkys A Regalado, MD   2.5 mg at 11/24/15 0053  . multivitamin with minerals tablet 1 tablet  1 tablet Oral Daily Belkys A Regalado, MD   1 tablet at 11/27/15 1118  . nicotine (NICODERM CQ - dosed in mg/24 hours) patch 21 mg  21 mg Transdermal Daily Rodolph Bong, MD   21 mg at 11/27/15 1119  . ondansetron (ZOFRAN) injection 4 mg  4 mg Intravenous Q6H PRN Jinger Neighbors, NP   4 mg at 11/19/15 1010  . pantoprazole (PROTONIX) EC tablet 40 mg  40 mg Oral Daily Belkys A Regalado, MD   40 mg at 11/27/15 1117  . polyethylene glycol (MIRALAX / GLYCOLAX) packet 17 g  17 g Oral Daily Lupita Leash, MD   17 g at 11/27/15 1117  . QUEtiapine (SEROQUEL) tablet 75 mg  75 mg Oral Q12H Rodolph Bong, MD   75 mg at 11/27/15 1117  . senna-docusate (Senokot-S) tablet 2 tablet  2 tablet Oral BID Albertine Grates, MD   2 tablet at 11/27/15 1117  . sodium chloride 0.9 % bolus 500 mL  500 mL Intravenous Once Belkys A Regalado, MD      . sodium chloride flush (NS) 0.9 % injection 10-40 mL  10-40 mL Intracatheter Q12H Jose Alexis Frock, MD   10 mL at 11/25/15 2205  . sodium chloride flush (NS) 0.9 % injection 10-40 mL  10-40 mL Intracatheter PRN Jose Alexis Frock, MD   10 mL at 11/25/15 1901  . vancomycin (VANCOCIN) 1,250 mg in sodium chloride 0.9 % 250 mL IVPB  1,250 mg Intravenous Q8H Lynita Lombard Lucas, RPH   1,250 mg at 11/27/15 1555  . zolpidem (AMBIEN) tablet 5 mg  5 mg Oral Once Leanne Chang, NP         Discharge Medications: Please see discharge summary for a list of discharge medications.  Relevant Imaging  Results:  Relevant Lab Results:   Additional Information SS#: [id number]  [facility], LCSW

## 2015-11-28 LAB — BASIC METABOLIC PANEL
Anion gap: 5 (ref 5–15)
BUN: 13 mg/dL (ref 6–20)
CALCIUM: 8.6 mg/dL — AB (ref 8.9–10.3)
CO2: 29 mmol/L (ref 22–32)
CREATININE: 0.58 mg/dL — AB (ref 0.61–1.24)
Chloride: 102 mmol/L (ref 101–111)
GFR calc Af Amer: >60 mL/min (ref >60)
GLUCOSE: 110 mg/dL — AB (ref 65–99)
Potassium: 4.1 mmol/L (ref 3.5–5.1)
Sodium: 136 mmol/L (ref 135–145)

## 2015-11-28 NOTE — Progress Notes (Signed)
Occupational Therapy Treatment Patient Details Name: Kevin Austin MRN: [id number] DOB: 1991/04/09 Today's Date: 11/28/2015    History of present illness Pt adm with disseminated MRSA bacteremia. Pt with repeated treatment with surgical drainage of large abscesses including bil hands, bil ankles, sternum, and rt hip. Pt intubated 6/26. Self extubated and then reintubated. Trach decannulated and sternal wound vac placed 11/14/15. PMH - IV drug abuse.    OT comments  Pt appears more confused than he did last week.  He is very internally distracted by pain, and those things that he is unable to do.  Is upset that he is not receiving Dilauded and Morphine for pain.  Pt with increased tightness of rt hand today.  Pt reports he has not been doing exercises.  Soft tissue mobs followed by active and passive stretch of digits performed.   Follow Up Recommendations  SNF;Supervision/Assistance - 24 hour    Equipment Recommendations  None recommended by OT    Recommendations for Other Services      Precautions / Restrictions Precautions Precautions: Fall       Mobility Bed Mobility                  Transfers                      Balance                                   ADL                                                Vision                     Perception     Praxis      Cognition   Behavior During Therapy: Anxious;Flat affect Overall Cognitive Status: Impaired/Different from baseline Area of Impairment: Attention;Memory;Safety/judgement;Problem solving   Current Attention Level: Selective Memory: Decreased short-term memory;Decreased recall of precautions  Following Commands: Follows one step commands consistently Safety/Judgement: Decreased awareness of safety;Decreased awareness of deficits Awareness: Intellectual Problem Solving: Slow processing;Decreased initiation;Requires verbal cues;Difficulty  sequencing;Requires tactile cues General Comments: Pt somewhat confusted today.  self distracts with pain and what he can't do.  Is fixated on needing Dilauded or Morphine and upset that they aren't giving him those meds for his pain.     Extremity/Trunk Assessment               Exercises Other Exercises Other Exercises: Pt with stiffness of MCPs and decreased composite flexion of digits and decreased active extension of fingers as well as decreased composite extension of wrist and hand (Rt).  soft tissue mobilization performed digits, wrist and hand followed by AROM/PROM and stretch and tendon gliding exercises    Shoulder Instructions       General Comments      Pertinent Vitals/ Pain       Pain Assessment: Faces Faces Pain Scale: Hurts whole lot Pain Location: Rt hand with ROM  Pain Descriptors / Indicators: Aching;Grimacing;Moaning Pain Intervention(s): Monitored during session  Home Living  Prior Functioning/Environment              Frequency Min 2X/week     Progress Toward Goals  OT Goals(current goals can now be found in the care plan section)  Progress towards OT goals: Progressing toward goals  ADL Goals Pt Will Perform Eating: sitting;with modified independence Pt Will Perform Grooming: standing;with modified independence Pt Will Perform Upper Body Bathing: standing;with modified independence Pt Will Perform Lower Body Bathing: with modified independence;sit to/from stand Pt Will Transfer to Toilet: with modified independence;ambulating;regular height toilet Pt Will Perform Toileting - Clothing Manipulation and hygiene: with modified independence;sit to/from stand Pt/caregiver will Perform Home Exercise Program: Increased ROM;Increased strength;Both right and left upper extremity;Independently;With written HEP provided  Plan Discharge plan remains appropriate;Frequency needs to be updated     Co-evaluation                 End of Session     Activity Tolerance Patient tolerated treatment well   Patient Left in bed;with call bell/phone within reach   Nurse Communication          Time: 9604-5409 OT Time Calculation (min): 39 min  Charges: OT General Charges $OT Visit: 1 Procedure OT Treatments $Therapeutic Exercise: 38-52 mins  Marcelina Mclaurin M 11/28/2015, 3:46 PM

## 2015-11-28 NOTE — Progress Notes (Signed)
Physical Therapy Treatment Patient Details Name: Kevin Austin MRN: [id number] DOB: 1990-06-11 Today's Date: 11/28/2015    History of Present Illness Pt adm with disseminated MRSA bacteremia. Pt with repeated treatment with surgical drainage of large abscesses including bil hands, bil ankles, sternum, and rt hip. Pt intubated 6/26. Self extubated and then reintubated. Trach decannulated and sternal wound vac placed 11/14/15. PMH - IV drug abuse.     PT Comments    Pt is up to walk after extensive conversation with him including nursing to reinforce the purpose of therapy and his medical care.  He is convinced the staff is intervening with his personal freedom to make a phone call and to have visits.  He is asking to sleep and PT and nurse agreed he can have no staff x nurse in for the next hour to let him sleep unless pt calls his call light for staff.  Continue acutely to increase strength and safety with balance pending SNF referral.  Follow Up Recommendations  SNF     Equipment Recommendations  Rolling walker with 5" wheels    Recommendations for Other Services       Precautions / Restrictions Precautions Precautions: Fall Precaution Comments: watch HR Restrictions Weight Bearing Restrictions: Yes RLE Weight Bearing: Weight bearing as tolerated LLE Weight Bearing: Weight bearing as tolerated    Mobility  Bed Mobility Overal bed mobility: Needs Assistance Bed Mobility: Sit to Supine;Supine to Sit Rolling: Supervision Sidelying to sit: Supervision Supine to sit: Supervision Sit to supine: Supervision   General bed mobility comments: Supervision for safety. Increased time and effort  Transfers Overall transfer level: Needs assistance Equipment used: Rolling walker (2 wheeled) Transfers: Sit to/from [employer] Sit to Stand: Min guard Stand pivot transfers: Min guard (wtih PT monitoring equipment)       General transfer comment: min guard due to lethargic  presentation  Ambulation/Gait Ambulation/Gait assistance: Min guard Ambulation Distance (Feet): 80 Feet Assistive device: Rolling walker (2 wheeled) Gait Pattern/deviations: Step-through pattern;Step-to pattern;Wide base of support;Trunk flexed;Decreased stride length Gait velocity: decreased Gait velocity interpretation: Below normal speed for age/gender General Gait Details: better navigation of obstacles with pt following verbal cues   Stairs            Wheelchair Mobility    Modified Rankin (Stroke Patients Only)       Balance Overall balance assessment: Needs assistance   Sitting balance-Leahy Scale: Good     Standing balance support: Bilateral upper extremity supported Standing balance-Leahy Scale: Fair                      Cognition Arousal/Alertness: Lethargic Behavior During Therapy: Flat affect Overall Cognitive Status: Impaired/Different from baseline Area of Impairment: Safety/judgement;Awareness;Problem solving   Current Attention Level: Selective   Following Commands: Follows one step commands with increased time Safety/Judgement: Decreased awareness of safety;Decreased awareness of deficits Awareness: Intellectual Problem Solving: Slow processing;Decreased initiation;Difficulty sequencing;Requires verbal cues;Requires tactile cues General Comments: prompting to remind pt the purpose of his hospital stay as he is asking to leave and get PICC removed    Exercises      General Comments General comments (skin integrity, edema, etc.): Pt is arguing with staff and nurse in to talk with him about remaining in facility for tx.  He agrees to think about it for now.      Pertinent Vitals/Pain Pain Assessment: Faces Faces Pain Scale: Hurts little more Pain Location: R thih Pain Descriptors / Indicators: Aching Pain  Intervention(s): Monitored during session;Premedicated before session;Repositioned    Home Living                       Prior Function            PT Goals (current goals can now be found in the care plan section) Acute Rehab PT Goals Patient Stated Goal: to decide if he is going to stay here and finish tx Progress towards PT goals: Progressing toward goals    Frequency  Min 3X/week    PT Plan Current plan remains appropriate    Co-evaluation             End of Session   Activity Tolerance: Patient limited by lethargy Patient left: in bed;with call bell/phone within reach;with bed alarm set     Time: 7829-5621 PT Time Calculation (min) (ACUTE ONLY): 36 min  Charges:  $Gait Training: 8-22 mins $Therapeutic Activity: 8-22 mins                    G Codes:      Ivar Drape 12-12-2015, 11:41 AM    Samul Dada, PT MS Acute Rehab Dept. Number: [facility] [id number] and [facility] [phone]
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
## 2015-11-28 NOTE — Progress Notes (Signed)
Patient refused to have EKG done at this time. He just wants to sleep

## 2015-11-29 MED ORDER — OXYCODONE HCL ER 15 MG PO T12A
15.0000 mg | EXTENDED_RELEASE_TABLET | Freq: Two times a day (BID) | ORAL | Status: DC
  Administered 2015-11-29 (×2): 15 mg via ORAL
  Filled 2015-11-29 (×2): qty 1

## 2015-11-29 MED ORDER — QUETIAPINE FUMARATE 25 MG PO TABS: 75.0000 mg | ORAL_TABLET | Freq: Two times a day (BID) | ORAL | 0 refills | Status: DC

## 2015-11-29 MED ORDER — OXYCODONE HCL 5 MG PO TABS
5.0000 mg | ORAL_TABLET | Freq: Four times a day (QID) | ORAL | Status: DC | PRN
  Administered 2015-11-29 – 2015-12-09 (×8): 5 mg via ORAL
  Filled 2015-11-29 (×8): qty 1

## 2015-11-29 MED ORDER — FERROUS SULFATE 325 (65 FE) MG PO TABS: 325.0000 mg | ORAL_TABLET | Freq: Two times a day (BID) | ORAL | Status: DC

## 2015-11-29 MED ORDER — OXYCODONE HCL ER 15 MG PO T12A: 15.0000 mg | EXTENDED_RELEASE_TABLET | Freq: Two times a day (BID) | ORAL | 0 refills | Status: DC

## 2015-11-29 MED ORDER — OXYCODONE HCL 5 MG PO TABS: 5.0000 mg | ORAL_TABLET | Freq: Four times a day (QID) | ORAL | 0 refills | Status: DC | PRN

## 2015-11-29 MED ORDER — MORPHINE SULFATE (PF) 2 MG/ML IV SOLN
1.0000 mg | Freq: Once | INTRAVENOUS | Status: AC | PRN
  Administered 2015-11-29: 1 mg via INTRAVENOUS
  Filled 2015-11-29: qty 1

## 2015-11-29 NOTE — Progress Notes (Signed)
RN attempted again to reason with patient to perform daily assessment. Patient refusing however will let nurse give due pain meds and perform dressing changes.

## 2015-11-29 NOTE — Progress Notes (Signed)
Pharmacy Antibiotic Note  Kevin Austin is a 25 y.o. male admitted on 10/22/2015 with disseminated MRSA infection.   Vancomycin for disseminated MRSA (blood, resp, wounds, spine). He had labile VT in past and vanc was changed to teflaro, but now changed back with continued fevers.  Vancomycin trough on 7/28 therapeutic on current regimen. Will re-check early this week. Afebrile, wbc low 3.2, Renal function stable, good UOP.  Prior to discharge, patient has refused vancomycin and all lab draws. Patient renal function and urine output  has been stable throughout hospital course. Given no major changes, would continue current vancomycin dose since patient has been therapeutic.   Plan: -Continue Vancomycin 1250 mg IV q8h -Per ID note from 7/25: Continue vancomycin through 8/22 and then switch to doxycycline for 1 month -Continue to monitory renal function, UOP, VT prn as indicated   :Height: 6' (182.9 cm) Weight: 149 lb 11.2 oz (67.9 kg) IBW/kg (Calculated) : 77.6  Temp (24hrs), Avg:98 F (36.7 C), Min:97.6 F (36.4 C), Max:98.7 F (37.1 C)   Recent Labs Lab 11/23/15 0730 11/23/15 1249 11/24/15 0500 11/26/15 1501 11/27/15 0330 11/28/15 1525  WBC  --   --  3.6*  --  3.2*  --   CREATININE  --  0.53* 0.54* 0.59* 0.69 0.58*  VANCOTROUGH 18  --   --   --   --   --     Estimated Creatinine Clearance: 137.9 mL/min (by C-G formula based on SCr of 0.8 mg/dL).    Allergies  Allergen Reactions  . Ketorolac Nausea Only    Per [facility] records  . Tramadol Nausea Only    Per [facility] records    Antimicrobials this admission: Rifampin 7/11 > 7/19 Ceftaroline 7/11 >> 7/24 Vanc 6/26 > 7/11, 7/24>> Zosyn 6/26 >>6/27, 7/24>>7/25 Cefepime 6/26 >6/26 Ceftaz 6/27>>6/28  Dose adjustments this admission: 6/28 VT = 9 on 1 gq8h 6/30 VT = 14 on 1250 mgq8h 7/1 VT = 20 on 1500 mgq8h(drawn 3 hrs after dose) 7/2 VT = 17 on 1500 mgq8h 7/5 VT = 34 on 1500 mgq8h 7/6 VR = 10, resume at 1500  mg q12h 7/8 VT = 10 incr to 1750 mg q12h 7/10 VT = 11 incr to 1250 mg q8h 7/28 VT = 18 on 1250mg  q8h  Microbiology results: 6/26 BCx2: MRSA 6/26 BCID: MRSA 6/26 UCx: neg 6/26 Sputum: MRSA 6/26 MRSA PCR: pos 6/27 R hand abscess: MRSA 6/27 abscess- fungal: neg 6/28 TA: few MRSA 6/28 BCx2: neg 6/28 BCID: MSSA (?) 6/30 BCx2: neg 7/1 ankle fluid: MRSA 7/6 sternum abscess: MRSA 7/10 resp cx: MRSA 7/10 blood cx: 1/2 CoNS 7/10: UCx: negF 7/11 hip wound: negF 7/25 R pleural fluid: ngf  Ruben Im, PharmD Clinical Pharmacist Pager: [phone] 11/29/2015 2:33 PM

## 2015-11-29 NOTE — Progress Notes (Signed)
Occupational Therapy Treatment Patient Details Name: Kevin Austin MRN: [id number] DOB: 03-Oct-1990 Today's Date: 11/29/2015    History of present illness Pt adm with disseminated MRSA bacteremia. Pt with repeated treatment with surgical drainage of large abscesses including bil hands, bil ankles, sternum, and rt hip. Pt intubated 6/26. Self extubated and then reintubated. Trach decannulated and sternal wound vac placed 11/14/15. PMH - IV drug abuse.    OT comments  Pt very distractible today with self limiting behaviors. Despite agreeing multiple times to work with OT, pt only participated very minimally.  Pt hyper focused on ailments frequently self distracting from task at hand, then unable to redirect him.  Pt made multiple comments about leaving his DNA all over the place, and 'they've gathered enough of my DNA around here"  Will continue attempts.  Pt may benefit of implementation  of a behavioral contract   Follow Up Recommendations  SNF;Supervision/Assistance - 24 hour    Equipment Recommendations  None recommended by OT    Recommendations for Other Services      Precautions / Restrictions Precautions Precautions: Fall       Mobility Bed Mobility Overal bed mobility: Needs Assistance Bed Mobility: Supine to Sit   Sidelying to sit: Supervision          Transfers                 General transfer comment: Despite pt agreeing multiple times to moving OOB and working on ADLs and functional mobility, pt did not move farther than to EOB despite max cues/encouragement     Balance                                   ADL                                         General ADL Comments: Pt with complaint that he has not been bathed.  Offered to assist him with sponge bath.  Pt initially was very receptive, but once items set up, pt with multiple complaints and reasons why he couldn't do the task - unable to rationalize with him or redirect him.  Pt  requesting to use bathroom, however, as i attempted to get him up, he self distracted, and later stated he no longer had to use bathroom.   Long discussion with pt re: need to work with therapies and that he needed to work with staff to allow them to meet his needs vs. refusing - pt very distracted and unsure he fully comprehends his behaviors       Vision                     Perception     Praxis      Cognition   Behavior During Therapy: Anxious;Flat affect Overall Cognitive Status: Impaired/Different from baseline Area of Impairment: Attention;Safety/judgement;Problem solving   Current Attention Level: Sustained    Following Commands: Follows one step commands consistently;Follows one step commands inconsistently Safety/Judgement: Decreased awareness of safety   Problem Solving: Difficulty sequencing;Requires verbal cues;Requires tactile cues General Comments: Pt hyperfocused on his ailments and everything that is not right.  He self distracts requiring max cues to refocus on task, and then many times unable to redirect him to task at hand    Extremity/Trunk  Assessment               Exercises Other Exercises Other Exercises: Pt performed 3 intermittent reps of finger flexion only.  Efforts at [facility] were futile.  Pt hyperfocused on peeling skin, edema, and that he has not had his dressing changed.  Spoke with RN who reports pt has been refusing dressing changes.  Explained to pt risk of loss of function if he doesn't perform the exercises (due to loss of ROM), but he would not engage - unsure he fully comprehends his behaviors    Shoulder Instructions       General Comments      Pertinent Vitals/ Pain       Pain Assessment: Faces Faces Pain Scale: Hurts little more Pain Location: generalized - multiple complaints  Pain Descriptors / Indicators: Aching;Grimacing Pain Intervention(s): Monitored during session  Home Living                                           Prior Functioning/Environment              Frequency Min 2X/week     Progress Toward Goals  OT Goals(current goals can now be found in the care plan section)  Progress towards OT goals: Progressing toward goals  ADL Goals Pt Will Perform Eating: sitting;with modified independence Pt Will Perform Grooming: standing;with modified independence Pt Will Perform Upper Body Bathing: standing;with modified independence Pt Will Perform Lower Body Bathing: with modified independence;sit to/from stand Pt Will Transfer to Toilet: with modified independence;ambulating;regular height toilet Pt Will Perform Toileting - Clothing Manipulation and hygiene: with modified independence;sit to/from stand Pt/caregiver will Perform Home Exercise Program: Increased ROM;Increased strength;Both right and left upper extremity;Independently;With written HEP provided  Plan Discharge plan remains appropriate    Co-evaluation                 End of Session     Activity Tolerance Other (comment) (self limiting behaviors )   Patient Left in bed;with call bell/phone within reach   Nurse Communication Mobility status        Time: 8119-1478 OT Time Calculation (min): 45 min  Charges: OT General Charges $OT Visit: 1 Procedure OT Treatments $Therapeutic Activity: 23-37 mins  Jaicob Dia M 11/29/2015, 6:00 PM

## 2015-11-29 NOTE — Progress Notes (Signed)
This morning RN notified of leads off on tele. Nurse went into the room to correct tele and patient refusing. Patient also cursing and stating "no one is touching me, I'm done with this, I'm ready to leave, none of this is helping and ya'll are not giving me the good pain medicine." Patient also refusing assessment and has disconnected wound vac not allowing RN to reconnect tubing. RN tried reasoning with patient and offering support but was unsuccessful. Attending MD notified and aware. Order given to dc tele. RN will attempt to reason with patient at later time.
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
## 2015-11-29 NOTE — Progress Notes (Signed)
Pt's mother called earlier wanting to speak with nurse. I was in pt's room and was not able to speak with her at the moment. I returned her missed call and was unable to reach the mother. Spoke with grandmother and left message with grandmother for her to call me back if she wanted.

## 2015-11-29 NOTE — Care Management Note (Signed)
Case Management Note  Patient Details  Name: Kevin Austin MRN: [id number] Date of Birth: [birth year]  Subjective/Objective:   MRSA bacteremia, Septic emboli, endocarditis                 Action/Plan: Discharge Planning: AVS reviewed: Chart reviewed. CSW following for SNF placement.    Expected Discharge Date:  11/29/2015               Expected Discharge Plan:  Skilled Nursing Facility  In-House Referral:  Clinical Social Work  Discharge planning Services  CM Consult  Post Acute Care Choice:  NA Choice offered to:  NA  DME Arranged:  N/A DME Agency:  NA  HH Arranged:  NA HH Agency:  NA  Status of Service:  Completed, signed off  If discussed at [employer] of Stay Meetings, dates discussed:    Additional Comments:  Elliot Cousin, RN 11/29/2015, 10:46 AM

## 2015-11-29 NOTE — Progress Notes (Signed)
Patient agreeable to assessment at this time.

## 2015-11-30 ENCOUNTER — Other Ambulatory Visit: Payer: Self-pay | Admitting: *Deleted

## 2015-11-30 DIAGNOSIS — J90 Pleural effusion, not elsewhere classified: Secondary | ICD-10-CM

## 2015-11-30 MED ORDER — OXYCODONE HCL ER 15 MG PO T12A
30.0000 mg | EXTENDED_RELEASE_TABLET | Freq: Two times a day (BID) | ORAL | Status: DC
  Administered 2015-11-30 – 2015-12-07 (×14): 30 mg via ORAL
  Filled 2015-11-30 (×15): qty 2
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
## 2015-11-30 NOTE — Clinical Social Work Note (Signed)
Patient's PASRR #  [id number]   Shonny Zion Ta, LCSW [id number]

## 2015-11-30 NOTE — Consult Note (Addendum)
WOC follow-up: PA in to assess chest wound earlier; removed Vac dressing and ordered moist fluffed gauze.  WOC requested to assess wound and evaluate for possible further topical treatment options.  Wound has greatly improved since previous assessment; 8X3.5X.3cm, 100% beefy red, small amt yellow drainage, no odor. Agree with daily moist gauze dressing changes. Please re-consult if further assistance is needed.  Thank-you,  Cammie Mcgee MSN, RN, CWOCN, [place], CNS [phone]

## 2015-11-30 NOTE — Progress Notes (Addendum)
Pt refuses to let me plug in or attach his wound vac. Pt is anxious and paranoid all night and this morning.

## 2015-11-30 NOTE — Progress Notes (Signed)
      [address]       [place] [address]             [phone]      24 Days Post-Op Procedure(s) (LRB): IRRIGATION AND DEBRIDEMENT HIP (Right)   Subjective:  Patient somnolent.  Allowed me to assess.  States I can change his wound vac.  He does not know when this was last done.  Currently wound vac machine is turned off.  Objective: Vital signs in last 24 hours: Temp:  [97.6 F (36.4 C)-98.4 F (36.9 C)] 98.1 F (36.7 C) (08/04 0536) Pulse Rate:  [97-103] 103 (08/04 0536) Resp:  [16-18] 18 (08/04 0536) BP: (107-123)/(63-81) 123/81 (08/04 0536) SpO2:  [100 %] 100 % (08/04 0536)    Intake/Output from previous day: 08/03 0701 - 08/04 0700 In: -  Out: 500 [Urine:500]  General appearance: alert, cooperative and no distress Heart: regular rate and rhythm Lungs: clear to auscultation bilaterally Abdomen: soft, non-tender; bowel sounds normal; no masses,  no organomegaly Wound: red granulation tissue present, no purulence, wound depth is minimal  Lab Results: No results for input(s): WBC, HGB, HCT, PLT in the last 72 hours. BMET:  Recent Labs  11/28/15 1525  NA 136  K 4.1  CL 102  CO2 29  GLUCOSE 110*  BUN 13  CREATININE 0.58*  CALCIUM 8.6*    PT/INR: No results for input(s): LABPROT, INR in the last 72 hours. ABG    Component Value Date/Time   PHART 7.446 10/29/2015 0823   HCO3 37.7 (H) 10/29/2015 0823   TCO2 39 10/29/2015 0823   ACIDBASEDEF 1.0 10/22/2015 1832   O2SAT 100.0 10/29/2015 0823   CBG (last 3)  No results for input(s): GLUCAP in the last 72 hours.  Assessment/Plan: S/P Procedure(s) (LRB): IRRIGATION AND DEBRIDEMENT HIP (Right)  1. Sternal Abscess with Osteomyelitis- wound vac removed, wound looks good.  + red granulation tissue, minor bleeding in skin wound... Will leave vac off as I feel wound is too shallow for further vac care... Will do daily wet to dry dressing changes.. Will have wound care evaluate Monday to  ensure they agree with discontinuation of wound vac 2. Care per primary   LOS: 39 days    Kevin Austin 11/30/2015

## 2015-12-01 LAB — VANCOMYCIN, TROUGH: VANCOMYCIN TR: 21 ug/mL — AB (ref 15–20)

## 2015-12-01 LAB — GLUCOSE, CAPILLARY: Glucose-Capillary: 96 mg/dL (ref 65–99)

## 2015-12-01 MED ORDER — VANCOMYCIN HCL IN DEXTROSE 1-5 GM/200ML-% IV SOLN
1000.0000 mg | Freq: Three times a day (TID) | INTRAVENOUS | Status: DC
  Administered 2015-12-01 – 2015-12-11 (×29): 1000 mg via INTRAVENOUS
  Filled 2015-12-01 (×32): qty 200
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
## 2015-12-01 NOTE — Progress Notes (Signed)
Pharmacy Antibiotic Note  Kevin Austin is a 25 y.o. male admitted on 10/22/2015 with disseminated MRSA infection.   Vancomycin for disseminated MRSA (blood, resp, wounds, spine). He had labile VT in past and vanc was changed to teflaro, but now changed back with continued fevers.  VT = 21   Plan: -Decrease vanc to 1g q8h -Per ID note from 7/25: Continue vancomycin through 8/22 and then switch to doxycycline for 1 month -Continue to monitory renal function, UOP, VT prn as indicated   :Height: 6' (182.9 cm) Weight: 149 lb 11.2 oz (67.9 kg) IBW/kg (Calculated) : 77.6  Temp (24hrs), Avg:98 F (36.7 C), Min:97.6 F (36.4 C), Max:98.3 F (36.8 C)   Recent Labs Lab 11/26/15 1501 11/27/15 0330 11/28/15 1525 12/01/15 1715  WBC  --  3.2*  --   --   CREATININE 0.59* 0.69 0.58*  --   VANCOTROUGH  --   --   --  21*    Estimated Creatinine Clearance: 137.9 mL/min (by C-G formula based on SCr of 0.8 mg/dL).    Allergies  Allergen Reactions  . Ketorolac Nausea Only    Per [facility] records  . Tramadol Nausea Only    Per [facility] records    Antimicrobials this admission: Rifampin 7/11 > 7/19 Ceftaroline 7/11 >> 7/24 Vanc 6/26 > 7/11, 7/24>> Zosyn 6/26 >>6/27, 7/24>>7/25 Cefepime 6/26 >6/26 Ceftaz 6/27>>6/28  Dose adjustments this admission: 6/28 VT = 9 on 1 gq8h 6/30 VT = 14 on 1250 mgq8h 7/1 VT = 20 on 1500 mgq8h(drawn 3 hrs after dose) 7/2 VT = 17 on 1500 mgq8h 7/5 VT = 34 on 1500 mgq8h 7/6 VR = 10, resume at 1500 mg q12h 7/8 VT = 10 incr to 1750 mg q12h 7/10 VT = 11 incr to 1250 mg q8h 7/28 VT = 18 on 1250mg  q8h 8/5 VT = 21 ~ on time 1250 q8h  Microbiology results: 6/26 BCx2: MRSA 6/26 BCID: MRSA 6/26 UCx: neg 6/26 Sputum: MRSA 6/26 MRSA PCR: pos 6/27 R hand abscess: MRSA 6/27 abscess- fungal: neg 6/28 TA: few MRSA 6/28 BCx2: neg 6/28 BCID: MSSA (?) 6/30 BCx2: neg 7/1 ankle fluid: MRSA 7/6 sternum abscess: MRSA 7/10 resp cx: MRSA 7/10 blood cx: 1/2  CoNS 7/10: UCx: negF 7/11 hip wound: negF 7/25 R pleural fluid: ngf  Isaac Bliss, PharmD, BCPS, [facility] Clinical Pharmacist Pager [phone] 12/01/2015 6:31 PM
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
## 2015-12-03 LAB — BASIC METABOLIC PANEL
ANION GAP: 6 (ref 5–15)
BUN: 14 mg/dL (ref 6–20)
CALCIUM: 9.1 mg/dL (ref 8.9–10.3)
CO2: 27 mmol/L (ref 22–32)
CREATININE: 0.58 mg/dL — AB (ref 0.61–1.24)
Chloride: 102 mmol/L (ref 101–111)
GFR calc Af Amer: >60 mL/min (ref >60)
GFR calc non Af Amer: >60 mL/min (ref >60)
GLUCOSE: 131 mg/dL — AB (ref 65–99)
Potassium: 3.6 mmol/L (ref 3.5–5.1)
Sodium: 135 mmol/L (ref 135–145)

## 2015-12-03 NOTE — Progress Notes (Signed)
      [address]       [place] [id number]             [phone]      27 Days Post-Op Procedure(s) (LRB): IRRIGATION AND DEBRIDEMENT HIP (Right)   Subjective:  Mr. Kevin Austin complains he cant move his hands very good.  Objective: Vital signs in last 24 hours: Temp:  [97.7 F (36.5 C)-98.5 F (36.9 C)] 98.5 F (36.9 C) (08/07 0350) Pulse Rate:  [95-113] 95 (08/07 0350) Resp:  [18] 18 (08/07 0350) BP: (109-121)/(64-74) 112/67 (08/07 0350) SpO2:  [98 %-100 %] 98 % (08/07 0350)   Intake/Output from previous day: 08/06 0701 - 08/07 0700 In: -  Out: 1700 [Urine:1700] Intake/Output this shift: No intake/output data recorded.  General appearance: alert, cooperative and no distress Heart: regular rate and rhythm Lungs: clear to auscultation bilaterally Abdomen: soft, non-tender; bowel sounds normal; no masses,  no organomegaly Extremities: extremities normal, atraumatic, no cyanosis or edema Wound: sternotomy wound healing, no gross purulence noted, multiple scabbed lesions along hands, ankle  Lab Results: No results for input(s): WBC, HGB, HCT, PLT in the last 72 hours. BMET:  Recent Labs  12/03/15 0430  NA 135  K 3.6  CL 102  CO2 27  GLUCOSE 131*  BUN 14  CREATININE 0.58*  CALCIUM 9.1    PT/INR: No results for input(s): LABPROT, INR in the last 72 hours. ABG    Component Value Date/Time   PHART 7.446 10/29/2015 0823   HCO3 37.7 (H) 10/29/2015 0823   TCO2 39 10/29/2015 0823   ACIDBASEDEF 1.0 10/22/2015 1832   O2SAT 100.0 10/29/2015 0823   CBG (last 3)   Recent Labs  12/01/15 2138  GLUCAP 96    Assessment/Plan: S/P Procedure(s) (LRB): IRRIGATION AND DEBRIDEMENT HIP (Right)  1. Sternal abscess- continue wet to dry dressing changes 2. Care per primary   LOS: 42 days    Kevin Austin 12/03/2015

## 2015-12-03 NOTE — Progress Notes (Signed)
Nutrition Follow-up  DOCUMENTATION CODES:   Not applicable  INTERVENTION:    Continue Ensure Enlive po BID, each supplement provides 350 kcal and 20 grams of protein   Continue 30 ml Prostat BID, each supplement provides 100 kcals and 15 grams protein  NUTRITION DIAGNOSIS:   Increased nutrient needs related to wound healing as evidenced by estimated needs.  Ongoing  GOAL:   Patient will meet greater than or equal to 90% of their needs  Progressing  MONITOR:   PO intake, Supplement acceptance, Labs, Weight trends, Skin, I & O's  ASSESSMENT:   25 year old admitted with severe sepsis, multiple septic emboli. 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  Trach capped on 7/18 and pt decannulated on 11/14/15.   Pt continues on a Regular diet. PO intake 100% per flowsheet records. Sternal wound healing. Receiving Ensure Enlive & Prostat liquid protein BID. Awaiting SNF placement >> needs ABX for couple more weeks.  Diet Order:  Diet regular Room service appropriate?: Yes; Fluid consistency:: Thin Diet - low sodium heart healthy  Skin:  Wound (see comment) (multiple incisions and open wounds, sternal wound vac)  Last BM:  8/6  Height:   Ht Readings from Last 1 Encounters:  11/23/15 6' (1.829 m)    Weight:   Wt Readings from Last 1 Encounters:  11/26/15 149 lb 11.2 oz (67.9 kg)    Ideal Body Weight:  78.2 kg  BMI:  Body mass index is 20.3 kg/m.  Estimated Nutritional Needs:   Kcal:  2300-2500  Protein:  140-160 gm  Fluid:  >2.3 L  EDUCATION NEEDS:   No education needs identified at this time  Maureen ChattersKatie Arlanda Shiplett, RD, LDN Pager #: [phone] After-Hours Pager #: [phone]

## 2015-12-03 NOTE — Progress Notes (Signed)
Physical Therapy Treatment Patient Details Name: Kevin Austin MRN: [id number] DOB: 01-Mar-1991 Today's Date: 12/03/2015    History of Present Illness Pt adm with disseminated MRSA bacteremia. Pt with repeated treatment with surgical drainage of large abscesses including bil hands, bil ankles, sternum, and rt hip. Pt intubated 6/26. Self extubated and then reintubated. Trach decannulated and sternal wound vac placed 11/14/15. PMH - IV drug abuse.     PT Comments    Pt agreeable and cooperative with treatment although affect remains flat. Mobility improving.   Follow Up Recommendations  SNF     Equipment Recommendations  Rolling walker with 5" wheels    Recommendations for Other Services       Precautions / Restrictions Precautions Precautions: Fall Precaution Comments: watch HR Restrictions Weight Bearing Restrictions: Yes RLE Weight Bearing: Weight bearing as tolerated LLE Weight Bearing: Weight bearing as tolerated    Mobility  Bed Mobility Overal bed mobility: Needs Assistance Bed Mobility: Supine to Sit     Supine to sit: Min assist     General bed mobility comments: Assist to initiate elevating trunk.  Transfers Overall transfer level: Needs assistance Equipment used: Rolling walker (2 wheeled) Transfers: Sit to/from Stand Sit to Stand: Min guard         General transfer comment: Assist for safety and balance  Ambulation/Gait Ambulation/Gait assistance: Min guard Ambulation Distance (Feet): 200 Feet Assistive device: Rolling walker (2 wheeled) Gait Pattern/deviations: Step-through pattern;Decreased step length - right;Decreased step length - left;Antalgic Gait velocity: decreased Gait velocity interpretation: Below normal speed for age/gender General Gait Details: Occasional verbal cues to avoid obstacles due to pt distracted by activities in other rooms   Stairs            Wheelchair Mobility    Modified Rankin (Stroke Patients Only)        Balance Overall balance assessment: Needs assistance Sitting-balance support: No upper extremity supported;Feet supported Sitting balance-Leahy Scale: Good     Standing balance support: No upper extremity supported Standing balance-Leahy Scale: Fair                      Cognition Arousal/Alertness: Awake/alert Behavior During Therapy: Flat affect Overall Cognitive Status: Impaired/Different from baseline Area of Impairment: Safety/judgement;Awareness;Problem solving   Current Attention Level: Selective   Following Commands: Follows one step commands with increased time Safety/Judgement: Decreased awareness of safety;Decreased awareness of deficits Awareness: Intellectual Problem Solving: Slow processing;Decreased initiation;Difficulty sequencing;Requires verbal cues;Requires tactile cues      Exercises      General Comments        Pertinent Vitals/Pain Pain Assessment: Faces Faces Pain Scale: Hurts little more Pain Location: generalized Pain Descriptors / Indicators: Grimacing Pain Intervention(s): Limited activity within patient's tolerance;Monitored during session    Home Living                      Prior Function            PT Goals (current goals can now be found in the care plan section) Progress towards PT goals: Progressing toward goals    Frequency  Min 3X/week    PT Plan Current plan remains appropriate    Co-evaluation             End of Session Equipment Utilized During Treatment: Gait belt Activity Tolerance: Patient tolerated treatment well Patient left: with call bell/phone within reach;in chair;with chair alarm set     Time: 4098-1191 PT Time Calculation (min) (  ACUTE ONLY): 16 min  Charges:  $Gait Training: 8-22 mins                    G Codes:      Ricco Dershem 12/03/2015, 11:54 AM Skip Mayerary Keonte Daubenspeck PT [phone]
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
## 2015-12-04 ENCOUNTER — Ambulatory Visit: Payer: Self-pay | Admitting: Thoracic Surgery (Cardiothoracic Vascular Surgery)
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
## 2015-12-04 NOTE — Progress Notes (Signed)
      [address]       [place] [id number]             [phone]      28 Days Post-Op Procedure(s) (LRB): IRRIGATION AND DEBRIDEMENT HIP (Right)   Subjective:  [name] complains of being sweaty today.  He has some mild discomfort in chest wound.  Objective: Vital signs in last 24 hours: Temp:  [98 F (36.7 C)-98.4 F (36.9 C)] 98.2 F (36.8 C) (08/08 0500) Pulse Rate:  [88-103] 88 (08/08 0500) Resp:  [18] 18 (08/08 0500) BP: (99-106)/(58-80) 105/64 (08/08 0500) SpO2:  [99 %-100 %] 100 % (08/08 0500) Weight:  [147 lb 14.4 oz (67.1 kg)] 147 lb 14.4 oz (67.1 kg) (08/08 0500)  Intake/Output from previous day: 08/07 0701 - 08/08 0700 In: 1200 [P.O.:1200] Out: 850 [Urine:850]  General appearance: alert, cooperative and no distress Wound: Sternal wound, + granulation, new 1cm opening minimal tracking  Lab Results: No results for input(s): WBC, HGB, HCT, PLT in the last 72 hours. BMET:  Recent Labs  12/03/15 0430  NA 135  K 3.6  CL 102  CO2 27  GLUCOSE 131*  BUN 14  CREATININE 0.58*  CALCIUM 9.1    PT/INR: No results for input(s): LABPROT, INR in the last 72 hours. ABG    Component Value Date/Time   PHART 7.446 10/29/2015 0823   HCO3 37.7 (H) 10/29/2015 0823   TCO2 39 10/29/2015 0823   ACIDBASEDEF 1.0 10/22/2015 1832   O2SAT 100.0 10/29/2015 0823   CBG (last 3)   Recent Labs  12/01/15 2138  GLUCAP 96    Assessment/Plan: S/P Procedure(s) (LRB): IRRIGATION AND DEBRIDEMENT HIP (Right)  1. Sternal abscess- continues to heal, small opening will need packing and I spoke to nurse directly to ensure wound packing would be started with wet to drying dressing 2. Dispo- care per primary   LOS: 43 days    Kevin Austin 12/04/2015

## 2015-12-04 NOTE — Progress Notes (Signed)
Occupational Therapy Treatment Patient Details Name: Kevin Austin MRN: [id number] DOB: 29-Jan-1991 Today's Date: 12/04/2015    History of present illness Pt adm with disseminated MRSA bacteremia. Pt with repeated treatment with surgical drainage of large abscesses including bil hands, bil ankles, sternum, and rt hip. Pt intubated 6/26. Self extubated and then reintubated. Trach decannulated and sternal wound vac placed 11/14/15. PMH - IV drug abuse.    OT comments  Pt needed cues for sequencing and sustain attention on task this session.  Walked to sink for grooming, sat up in chair to wash under his arms.  Performed finger ROM exercises also this session  Follow Up Recommendations  SNF;Supervision/Assistance - 24 hour    Equipment Recommendations  None recommended by OT    Recommendations for Other Services      Precautions / Restrictions Precautions Precautions: Fall Precaution Comments: watch HR Restrictions RLE Weight Bearing: Weight bearing as tolerated LLE Weight Bearing: Weight bearing as tolerated       Mobility Bed Mobility               General bed mobility comments: pt was at EOB when OT arrived  Transfers     Transfers: Sit to/from Stand Sit to Stand: Min guard         General transfer comment: cues for UE placement    Balance             Standing balance-Leahy Scale: Fair                     ADL       Grooming: Oral care;Min guard;Standing;Cueing for sequencing (cues to continue toothbrushing. Wanted ensure before rinsing)   Upper Body Bathing: Set up;Sitting               Toilet Transfer: Min guard;Ambulation;RW (recliner)             General ADL Comments: pt needed cues to sustain attention to task and for sequencing this session.  Agreeable to working with therapy.  Stood for grooming then sat in chair for UB bathind      Vision                     Perception     Praxis      Cognition     Overall  Cognitive Status: Impaired/Different from baseline Area of Impairment: Safety/judgement   Current Attention Level: Sustained    Following Commands: Follows one step commands with increased time Safety/Judgement: Decreased awareness of safety;Decreased awareness of deficits Awareness: Intellectual Problem Solving: Slow processing;Decreased initiation;Difficulty sequencing;Requires verbal cues;Requires tactile cues General Comments: easily distracted.  Pt smiled at end of session then became emotional/tearful. Cues to continue with activity    Extremity/Trunk Assessment               Exercises General Exercises - Upper Extremity Digit Composite Flexion: Right;AAROM;Seated   Shoulder Instructions       General Comments      Pertinent Vitals/ Pain       Pain Assessment: Faces Faces Pain Scale: Hurts even more Pain Location: stomach Pain Descriptors / Indicators: Grimacing Pain Intervention(s): Limited activity within patient's tolerance;Monitored during session;Repositioned;RN gave pain meds during session  Home Living  Prior Functioning/Environment              Frequency Min 2X/week     Progress Toward Goals  OT Goals(current goals can now be found in the care plan section)  Progress towards OT goals: Progressing toward goals  Acute Rehab OT Goals Time For Goal Achievement: 12/11/15  Plan      Co-evaluation                 End of Session     Activity Tolerance Patient limited by fatigue;Treatment limited secondary to agitation   Patient Left in chair;with call bell/phone within reach;with chair alarm set   Nurse Communication          Time: [id number] OT Time Calculation (min): 24 min  Charges: OT General Charges $OT Visit: 1 Procedure OT Treatments $Self Care/Home Management : 8-22 mins $Therapeutic Exercise: 8-22 mins  Fatih Stalvey 12/04/2015, 10:55 AM  Marica OtterMaryellen  Onyx Edgley, OTR/L [phone] 12/04/2015

## 2015-12-05 LAB — VANCOMYCIN, TROUGH: VANCOMYCIN TR: 16 ug/mL (ref 15–20)

## 2015-12-05 NOTE — Progress Notes (Signed)
Pharmacy Antibiotic Note  Kevin Austin is a 25 y.o. male on Vancomycin for disseminated MRSA infection (blood, resp, wounds, spine). Plans for vancomycin until 12/18/15 then oral therapy with doxycycline for 1 month. The vancomycin trough was at goal at 16 on 1gm IV q8h   Plan: -Continue vanc to 1g q8h -Will follow patient progress  :Height: 6' (182.9 cm) Weight: 147 lb 14.4 oz (67.1 kg) IBW/kg (Calculated) : 77.6  Temp (24hrs), Avg:98.2 F (36.8 C), Min:97.8 F (36.6 C), Max:98.5 F (36.9 C)   Recent Labs Lab 11/28/15 1525 12/01/15 1715 12/03/15 0430 12/05/15 1045  CREATININE 0.58*  --  0.58*  --   VANCOTROUGH  --  21*  --  16    Estimated Creatinine Clearance: 136.3 mL/min (by C-G formula based on SCr of 0.8 mg/dL).    Allergies  Allergen Reactions  . [facility]a [facility]    [facility]  . Tramadol Nausea Only    Per [facility]    Antimicrobials this admission: Rifampin 7/11 > 7/19 Ceftaroline 7/11 >> 7/24 Vanc 6/26 > 7/11, 7/24>> Zosyn 6/26 >>6/27, 7/24>>7/25 Cefepime 6/26 >6/26 Ceftaz 6/27>>6/28  Dose adjustments this admission: 6/28 VT = 9 on 1 gq8h 6/30 VT = 14 on 1250 mgq8h 7/1 VT = 20 on 1500 mgq8h(drawn 3 hrs after dose) 7/2 VT = 17 on 1500 mgq8h 7/5 VT = 34 on 1500 mgq8h 7/6 VR = 10, resume at 1500 mg q12h 7/8 VT = 10 incr to 1750 mg q12h 7/10 VT = 11 incr to 1250 mg q8h 7/28 VT = 18 on 1250mg  q8h 8/5 VT = 21 ~ on time 1250 q8h 8/9 VT= 16 on 1000mg  IV q8h  Microbiology results: 6/26 BCx2: MRSA 6/26 BCID: MRSA 6/26 UCx: neg 6/26 Sputum: MRSA 6/26 MRSA PCR: pos 6/27 R hand abscess: MRSA 6/27 abscess- fungal: neg 6/28 TA: few MRSA 6/28 BCx2: neg 6/28 BCID: MSSA (?) 6/30 BCx2: neg 7/1 ankle fluid: MRSA 7/6 sternum abscess: MRSA 7/10 resp cx: MRSA 7/10 blood cx: 1/2 CoNS 7/10: UCx: negF 7/11 hip wound: negF 7/25 R pleural fluid: ngf  Harland GermanAndrew Velta [place], Pharm D 12/05/2015 12:10 PM

## 2015-12-05 NOTE — Progress Notes (Signed)
   12/05/15 1300  Clinical Encounter Type  Visited With Patient  Visit Type Spiritual support  Referral From Patient  Spiritual Encounters  Spiritual Needs Emotional  Stress Factors  Patient Stress Factors Major life changes;Health changes;Other (Comment)  Chaplain visited with patient, but was unable to stay long enough to complete conversation. Patient groggy and needing to talk about past errors and his future. Kirtis Challis, Chaplain

## 2015-12-05 NOTE — Progress Notes (Addendum)
      [address]       [place] [id number]             [phone]      29 Days Post-Op Procedure(s) (LRB): IRRIGATION AND DEBRIDEMENT HIP (Right)   Subjective:  No new complaints  Objective: Vital signs in last 24 hours: Temp:  [97.8 F (36.6 C)-98.5 F (36.9 C)] 97.8 F (36.6 C) (08/09 0442) Pulse Rate:  [96-103] 96 (08/09 0442) Resp:  [18-20] 20 (08/09 0442) BP: (97-111)/(52-64) 97/53 (08/09 0442) SpO2:  [99 %] 99 % (08/09 0442)   Intake/Output from previous day: 08/08 0701 - 08/09 0700 In: 720 [P.O.:720] Out: 1025 [Urine:1025] Intake/Output this shift: Total I/O In: -  Out: 600 [Urine:600]  Sternal wound: continues to heal,   Lab Results: No results for input(s): WBC, HGB, HCT, PLT in the last 72 hours. BMET:  Recent Labs  12/03/15 0430  NA 135  K 3.6  CL 102  CO2 27  GLUCOSE 131*  BUN 14  CREATININE 0.58*  CALCIUM 9.1    PT/INR: No results for input(s): LABPROT, INR in the last 72 hours. ABG    Component Value Date/Time   PHART 7.446 10/29/2015 0823   HCO3 37.7 (H) 10/29/2015 0823   TCO2 39 10/29/2015 0823   ACIDBASEDEF 1.0 10/22/2015 1832   O2SAT 100.0 10/29/2015 0823   CBG (last 3)  No results for input(s): GLUCAP in the last 72 hours.  Assessment/Plan: S/P Procedure(s) (LRB): IRRIGATION AND DEBRIDEMENT HIP (Right)  1. Sternal abscess- continue wound packing, wet and dry dressing changes 2. Care per primary   LOS: 44 days    [name], ERIN 12/05/2015 Patient seen and examined, agree with above  Viviann SpareSteven C. Dorris FetchHendrickson, MD [facility] [phone]

## 2015-12-05 NOTE — Clinical Social Work Note (Signed)
CSW met with patient. 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 Patient gave CSW verbal permission for CSW to contact his probation officer Frederico Hamman and discuss his need of continued medical care at a SNF. CSW has called the patient's probation officer Frederico Hamman and left message for a return call.   [employer], LCSW [phone]
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
## 2015-12-06 LAB — CBC
HEMATOCRIT: 29.6 % — AB (ref 39.0–52.0)
HEMOGLOBIN: 9.2 g/dL — AB (ref 13.0–17.0)
MCH: 24.9 pg — AB (ref 26.0–34.0)
MCHC: 31.1 g/dL (ref 30.0–36.0)
MCV: 80 fL (ref 78.0–100.0)
Platelets: 237 10*3/uL (ref 150–400)
RBC: 3.7 MIL/uL — ABNORMAL LOW (ref 4.22–5.81)
RDW: 16.7 % — AB (ref 11.5–15.5)
WBC: 6.7 10*3/uL (ref 4.0–10.5)

## 2015-12-06 LAB — COMPREHENSIVE METABOLIC PANEL
ALK PHOS: 93 U/L (ref 38–126)
ALT: 32 U/L (ref 17–63)
ANION GAP: 7 (ref 5–15)
AST: 24 U/L (ref 15–41)
Albumin: 2.9 g/dL — ABNORMAL LOW (ref 3.5–5.0)
BILIRUBIN TOTAL: 0.7 mg/dL (ref 0.3–1.2)
BUN: 15 mg/dL (ref 6–20)
CALCIUM: 9.2 mg/dL (ref 8.9–10.3)
CO2: 29 mmol/L (ref 22–32)
Chloride: 100 mmol/L — ABNORMAL LOW (ref 101–111)
Creatinine, Ser: 0.6 mg/dL — ABNORMAL LOW (ref 0.61–1.24)
GFR calc Af Amer: >60 mL/min (ref >60)
Glucose, Bld: 105 mg/dL — ABNORMAL HIGH (ref 65–99)
POTASSIUM: 4 mmol/L (ref 3.5–5.1)
Sodium: 136 mmol/L (ref 135–145)
TOTAL PROTEIN: 7.7 g/dL (ref 6.5–8.1)

## 2015-12-06 LAB — T4, FREE: Free T4: 1.04 ng/dL (ref 0.61–1.12)

## 2015-12-06 LAB — TSH: TSH: 8.117 u[IU]/mL — AB (ref 0.350–4.500)

## 2015-12-06 NOTE — Progress Notes (Signed)
   12/06/15 1552  OT Visit Information  Last OT Received On 12/06/15  Assistance Needed +1  History of Present Illness Pt adm with disseminated MRSA bacteremia. Pt with repeated treatment with surgical drainage of large abscesses including bil hands, bil ankles, sternum, and rt hip. Pt intubated 6/26. Self extubated and then reintubated. Trach decannulated and sternal wound vac placed 11/14/15. PMH - IV drug abuse.   Precautions  Precautions Fall  Precaution Comments watch HR  Pain Assessment  Pain Assessment Faces  Faces Pain Scale 4  Pain Descriptors / Indicators Restless  Pain Intervention(s) Limited activity within patient's tolerance;Monitored during session;Repositioned  Cognition  Arousal/Alertness Awake/alert  Behavior During Therapy Flat affect;WFL for tasks assessed/performed  Overall Cognitive Status Impaired/Different from baseline  Current Attention Level Sustained  Safety/Judgement Decreased awareness of safety;Decreased awareness of deficits  ADL  Eating/Feeding Independent  Toilet Transfer Minimal assistance;Ambulation;RW (back to bed)  General ADL Comments When I was passing by room, Thurston Poundsrey asked for a snack.  I brought this to him, and he was able to set up himself and consume.  When I passed by again, Thurston Poundsrey had pushed his tray away from him, was sitting at edge of chair with arms on armrests, ready to stand up.  I assisted him back to bed.  He had one LOB when ambulating back to bed, requiring support to regain his balance.  He had been sitting up for about 75 minutes  Bed Mobility  Sit to supine Supervision  Balance  Standing balance-Leahy Scale Poor  Standing balance comment loss of balance when ambulating back to bed with RW  Restrictions  RLE Weight Bearing WBAT  LLE Weight Bearing WBAT  Transfers  Equipment used Rolling walker (2 wheeled)  Sit to Stand Min guard  Stand pivot transfers Min assist  General transfer comment cues for UE placement and safety; LOB   OT - End of Session  Activity Tolerance Patient limited by fatigue  Patient left in bed;with call bell/phone within reach;with bed alarm set  OT Assessment/Plan  Follow Up Recommendations SNF;Supervision/Assistance - 24 hour  OT Equipment None recommended by OT  OT Goal Progression  Progress towards OT goals Progressing toward goals  OT Time Calculation  OT Start Time (ACUTE ONLY) 1445  OT Stop Time (ACUTE ONLY) 1455  OT Time Calculation (min) 10 min  OT General Charges  $OT Visit 1 Procedure  OT Treatments  $Therapeutic Activity 8-22 mins  SouthlakeMaryellen Cleo Santucci, OTR/L [phone] 12/06/2015

## 2015-12-06 NOTE — Progress Notes (Signed)
Occupational Therapy Treatment Patient Details Name: Kevin Austin MRN: [id number] DOB: 04-27-1991 Today's Date: 12/06/2015    History of present illness Pt adm with disseminated MRSA bacteremia. Pt with repeated treatment with surgical drainage of large abscesses including bil hands, bil ankles, sternum, and rt hip. Pt intubated 6/26. Self extubated and then reintubated. Trach decannulated and sternal wound vac placed 11/14/15. PMH - IV drug abuse.    OT comments  Pt participated well in OT for adl.  He sustained attention to task and continued with minimal cues  Follow Up Recommendations  SNF;Supervision/Assistance - 24 hour    Equipment Recommendations  None recommended by OT    Recommendations for Other Services      Precautions / Restrictions Precautions Precautions: Fall Precaution Comments: watch HR Restrictions Weight Bearing Restrictions: Yes RLE Weight Bearing: Weight bearing as tolerated LLE Weight Bearing: Weight bearing as tolerated       Mobility Bed Mobility         Supine to sit: Supervision     General bed mobility comments: use of bedrail  Transfers   Equipment used: 1 person hand held assist;Rolling walker (2 wheeled)   Sit to Stand: Min guard Stand pivot transfers: Min guard       General transfer comment: for safety; cues for UE placement    Balance                                   ADL           Upper Body Bathing: Set up;Sitting   Lower Body Bathing: Sit to/from stand;Min guard   Upper Body Dressing : Minimal assistance;Sitting (lines)   Lower Body Dressing: Min guard;Sit to/from stand   Toilet Transfer: Min guard;RW (chair)             General ADL Comments: got up into chair and performed ADL from recliner, sit to stand.  Needed assistance for gown due to line and for pulling shorts up as they had fallen to floor during bathing      Vision                     Perception     Praxis       Cognition   Behavior During Therapy: Flat affect;WFL for tasks assessed/performed Overall Cognitive Status: Impaired/Different from baseline     Current Attention Level: Sustained      Safety/Judgement: Decreased awareness of safety;Decreased awareness of deficits          Extremity/Trunk Assessment               Exercises     Shoulder Instructions       General Comments      Pertinent Vitals/ Pain       Pain Assessment: Faces Faces Pain Scale: Hurts a little bit Pain Location: stomach Pain Intervention(s): Limited activity within patient's tolerance;Monitored during session;Premedicated before session;Repositioned  Home Living                                          Prior Functioning/Environment              Frequency       Progress Toward Goals  OT Goals(current goals can now be found in the care plan section)  Progress towards  OT goals: Progressing toward goals     Plan      Co-evaluation                 End of Session     Activity Tolerance Patient tolerated treatment well   Patient Left in chair;with call bell/phone within reach;with chair alarm set   Nurse Communication          Time: [id number] OT Time Calculation (min): 26 min  Charges: OT General Charges $OT Visit: 1 Procedure OT Treatments $Self Care/Home Management : 23-37 mins  Beryle Bagsby 12/06/2015, 3:50 PM  Marica OtterMaryellen Maddax Palinkas, OTR/L [phone] 12/06/2015

## 2015-12-06 NOTE — Progress Notes (Signed)
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   By: Kennith Center M.D.   On: 11/08/2015 15:46    Time Spent in minutes  30   Pearson Grippe M.D on 12/06/2015 at 7:18 PM  Between 7am to 7pm - Pager - [phone]  After 7pm go to [email or web address] - password [facility]  [facility] -  Office  [phone]

## 2015-12-07 MED ORDER — ACETAMINOPHEN 325 MG PO TABS
650.0000 mg | ORAL_TABLET | Freq: Four times a day (QID) | ORAL | Status: DC | PRN
  Administered 2015-12-07 – 2015-12-18 (×20): 650 mg via ORAL
  Filled 2015-12-07 (×20): qty 2

## 2015-12-07 MED ORDER — OXYCODONE HCL ER 15 MG PO T12A
15.0000 mg | EXTENDED_RELEASE_TABLET | Freq: Two times a day (BID) | ORAL | Status: DC
  Administered 2015-12-07 – 2015-12-10 (×5): 15 mg via ORAL
  Filled 2015-12-07 (×13): qty 1

## 2015-12-07 NOTE — Progress Notes (Signed)
Pharmacy Antibiotic Note  Kevin Austin is a 25 y.o. male on Vancomycin for disseminated MRSA infection (blood, resp, wounds, spine). Plans for vancomycin until 12/18/15 then oral therapy with doxycycline for 1 month. SCr stable, CrCl > 100, therapeutic on vanc 1,000 mg IV q8h   Plan: - Continue vanc 1,000 mg IV q8h - VT next week - F/u patient progress   :Height: 6' (182.9 cm) Weight: 147 lb 14.4 oz (67.1 kg) IBW/kg (Calculated) : 77.6  Temp (24hrs), Avg:98.2 F (36.8 C), Min:98.1 F (36.7 C), Max:98.4 F (36.9 C)   Recent Labs Lab 12/01/15 1715 12/03/15 0430 12/05/15 1045 12/06/15 0400  WBC  --   --   --  6.7  CREATININE  --  0.58*  --  0.60*  VANCOTROUGH 21*  --  16  --     Estimated Creatinine Clearance: 136.3 mL/min (by C-G formula based on SCr of 0.8 mg/dL).    Allergies  Allergen Reactions  . [facility]ac Nausea Only    [facility]  . Tramadol Nausea Only    Per [facility]    Antimicrobials this admission: Rifampin 7/11 > 7/19 Ceftaroline 7/11 >> 7/24 Vanc 6/26 > 7/11, 7/24>> Zosyn 6/26 >>6/27, 7/24>>7/25 Cefepime 6/26 >6/26 Ceftaz 6/27>>6/28  Dose adjustments this admission: 6/28 VT = 9 on 1 gq8h 6/30 VT = 14 on 1250 mgq8h 7/1 VT = 20 on 1500 mgq8h(drawn 3 hrs after dose) 7/2 VT = 17 on 1500 mgq8h 7/5 VT = 34 on 1500 mgq8h 7/6 VR = 10, resume at 1500 mg q12h 7/8 VT = 10 incr to 1750 mg q12h 7/10 VT = 11 incr to 1250 mg q8h 7/28 VT = 18 on 1250mg  q8h 8/5 VT = 21 ~ on time 1250 q8h 8/9 VT= 16 on 1000mg  IV q8h  Microbiology results: 6/26 BCx2: MRSA 6/26 BCID: MRSA 6/26 UCx: neg 6/26 Sputum: MRSA 6/26 MRSA PCR: pos 6/27 R hand abscess: MRSA 6/27 abscess- fungal: neg 6/28 TA: few MRSA 6/28 BCx2: neg 6/28 BCID: MSSA (?) 6/30 BCx2: neg 7/1 ankle fluid: MRSA 7/6 sternum abscess: MRSA 7/10 resp cx: MRSA 7/10 blood cx: 1/2 CoNS 7/10: UCx: negF 7/11 hip wound: negF 7/25 R pleural fluid: ngf  Cassie L. Roseanne RenoStewart,  PharmD Infectious Diseases Clinical Pharmacist Pager: [phone] 12/07/2015 11:54 AM
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
## 2015-12-07 NOTE — Clinical Social Work Note (Signed)
CSW is working on difficult place bed for patient. CSW continuing to follow for support and discharge needs.  Valero EnergyShonny Suheyb Raucci, LCSW [phone]

## 2015-12-07 NOTE — Progress Notes (Signed)
PT Cancellation Note  Patient Details Name: Jolene Schimkerey Chohan MRN: [id number] DOB: [birth year]   Cancelled Treatment:    Reason Eval/Treat Not Completed: Other (comment) (was walking and received another meal and declined).  Will try later as time and pt allow.   Ivar DrapeStout, Demetreus Lothamer E 12/07/2015, 1:26 PM    Samul Dadauth Verleen Stuckey, PT MS Acute Rehab Dept. Number: [facility] [phone] and [facility] [phone]

## 2015-12-07 NOTE — Progress Notes (Signed)
Patient complaining of shortness of breath.Lungs sounds clear diminished at bases oxygen saturations 100% on room air  and respirations 16.Oxygen at 2 liters nasal cannula placed for comfort. Will continue to monitor.
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
## 2015-12-09 LAB — BASIC METABOLIC PANEL
Anion gap: 11 (ref 5–15)
BUN: 18 mg/dL (ref 6–20)
CHLORIDE: 98 mmol/L — AB (ref 101–111)
CO2: 27 mmol/L (ref 22–32)
CREATININE: 0.57 mg/dL — AB (ref 0.61–1.24)
Calcium: 9.1 mg/dL (ref 8.9–10.3)
Glucose, Bld: 100 mg/dL — ABNORMAL HIGH (ref 65–99)
POTASSIUM: 4 mmol/L (ref 3.5–5.1)
SODIUM: 136 mmol/L (ref 135–145)
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
## 2015-12-10 NOTE — Progress Notes (Signed)
Nutrition Follow-up  DOCUMENTATION CODES:   Not applicable  INTERVENTION:    Continue Ensure Enlive po BID, each supplement provides 350 kcal and 20 grams of protein   Continue 30 ml Prostat BID, each supplement provides 100 kcals and 15 grams protein  NUTRITION DIAGNOSIS:   Increased nutrient needs related to wound healing as evidenced by estimated needs.  Ongoing  GOAL:   Patient will meet greater than or equal to 90% of their needs  Progressing  MONITOR:   PO intake, Supplement acceptance, Labs, Weight trends, Skin, I & O's  ASSESSMENT:   25 year old admitted with severe sepsis, multiple septic emboli. 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  Trach capped on 7/18 and pt decannulated on 11/14/15.   Pt continues on a Regular diet. PO intake 100% per flowsheet records. Sternal wound continues to heal. Receiving Ensure Enlive & Prostat liquid protein BID. Awaiting SNF placement.  Diet Order:  Diet regular Room service appropriate?: Yes; Fluid consistency:: Thin Diet - low sodium heart healthy  Skin:  Wound (see comment) (multiple incisions and open wounds, sternal wound vac)  Last BM:  8/13  Height:   Ht Readings from Last 1 Encounters:  11/23/15 6' (1.829 m)    Weight:   Wt Readings from Last 1 Encounters:  12/04/15 147 lb 14.4 oz (67.1 kg)    Ideal Body Weight:  78.2 kg  BMI:  Body mass index is 20.06 kg/m.  Estimated Nutritional Needs:   Kcal:  2300-2500  Protein:  140-160 gm  Fluid:  >2.3 L  EDUCATION NEEDS:   No education needs identified at this time  Maureen ChattersKatie Shante M[place]et, RD, LDN Pager #: [phone] After-Hours Pager #: [phone]

## 2015-12-10 NOTE — Progress Notes (Signed)
Physical Therapy Discharge Patient Details Name: Kevin Austin MRN: [id number] DOB: 10-Mar-1991 Today's Date: 12/10/2015 Time: 2671-2458 PT Time Calculation (min) (ACUTE ONLY): 9 min  Patient discharged from PT services secondary to goals met and no further PT needs identified.  Please see latest therapy progress note for current level of functioning and progress toward goals.    Progress and discharge plan discussed with patient and/or caregiver: Patient/Caregiver agrees with plan  GP     [facility] 12/10/2015, 2:35 PM  [facility] PT [phone]

## 2015-12-10 NOTE — Progress Notes (Signed)
      [address]       [place] [id number]             [phone]       34 Days Post-Op Procedure(s) (LRB): IRRIGATION AND DEBRIDEMENT HIP (Right) Subjective: Feels okay. No issues overnight.   Objective: Vital signs in last 24 hours: Temp:  [97.7 F (36.5 C)-98.6 F (37 C)] 98 F (36.7 C) (08/14 0446) Pulse Rate:  [88-109] 92 (08/14 0446) Resp:  [18-19] 18 (08/14 0446) BP: (101-108)/(58-62) 101/61 (08/14 0446) SpO2:  [99 %-100 %] 100 % (08/14 0446)  Hemodynamic parameters for last 24 hours:    Intake/Output from previous day: 08/13 0701 - 08/14 0700 In: 720 [P.O.:720] Out: 1300 [Urine:1300] Intake/Output this shift: No intake/output data recorded.  General appearance: alert and cooperative Heart: S1, S2 normal Lungs: non-labored respirations Abdomen: soft, non-tender Extremities: edema right > left 1+ pitting edema Wound: packed wet to dry, no erythema  Lab Results: No results for input(s): WBC, HGB, HCT, PLT in the last 72 hours. BMET:  Recent Labs  12/09/15 0415  NA 136  K 4.0  CL 98*  CO2 27  GLUCOSE 100*  BUN 18  CREATININE 0.57*  CALCIUM 9.1    PT/INR: No results for input(s): LABPROT, INR in the last 72 hours. ABG    Component Value Date/Time   PHART 7.446 10/29/2015 0823   HCO3 37.7 (H) 10/29/2015 0823   TCO2 39 10/29/2015 0823   ACIDBASEDEF 1.0 10/22/2015 1832   O2SAT 100.0 10/29/2015 0823   CBG (last 3)  No results for input(s): GLUCAP in the last 72 hours.  Assessment/Plan: S/P Procedure(s) (LRB): IRRIGATION AND DEBRIDEMENT HIP (Right)  1. Sternal abscess- continues to heal by secondary intension. Afebrile. Small opening is packed wet to dry BID, confirmed with nursing.   2. Trach wound opening covered with a Band-Aid and tape. Primary to address dressing change and wound care.    LOS: 49 days    Sharlene Doryessa N Ethie Curless 12/10/2015
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
## 2015-12-10 NOTE — Progress Notes (Signed)
Physical Therapy Treatment Patient Details Name: Kevin Austin MRN: [id number] DOB: 05-Jul-1990 Today's Date: December 30, 2015    History of Present Illness Pt adm with disseminated MRSA bacteremia. Pt with repeated treatment with surgical drainage of large abscesses including bil hands, bil ankles, sternum, and rt hip. Pt intubated 6/26. Self extubated and then reintubated. Trach decannulated and sternal wound vac placed 11/14/15. PMH - IV drug abuse.     PT Comments    Pt independent with mobility and no further PT needed.  Follow Up Recommendations  No PT follow up     Equipment Recommendations  None recommended by PT    Recommendations for Other Services       Precautions / Restrictions Precautions Precautions: None Restrictions RLE Weight Bearing: Weight bearing as tolerated LLE Weight Bearing: Weight bearing as tolerated    Mobility  Bed Mobility Overal bed mobility: Independent         Sit to supine: Supervision      Transfers Overall transfer level: Independent Equipment used: None   Sit to Stand: Independent            Ambulation/Gait Ambulation/Gait assistance: Modified independent (Device/Increase time) Ambulation Distance (Feet): 350 Feet Assistive device: None Gait Pattern/deviations: Antalgic Gait velocity: decr Gait velocity interpretation: Below normal speed for age/gender General Gait Details: Pt with steady gait with only slight limp on rt ankle.    Stairs            Wheelchair Mobility    Modified Rankin (Stroke Patients Only)       Balance Overall balance assessment: Independent                                  Cognition Arousal/Alertness: Awake/alert Behavior During Therapy: Flat affect;WFL for tasks assessed/performed Overall Cognitive Status: Impaired/Different from baseline                 General Comments: Pt perseverating on wanting more to eat despite having eaten 2 lunches.    Exercises       General Comments        Pertinent Vitals/Pain Pain Assessment: Faces Faces Pain Scale: Hurts a little bit Pain Location: rt ankle Pain Descriptors / Indicators: Grimacing Pain Intervention(s): Limited activity within patient's tolerance;Monitored during session    Home Living                      Prior Function            PT Goals (current goals can now be found in the care plan section) Progress towards PT goals: Goals met/education completed, patient discharged from PT    Frequency       PT Plan Discharge plan needs to be updated    Co-evaluation             End of Session   Activity Tolerance: Patient tolerated treatment well Patient left: in chair     Time: 1410-1419 PT Time Calculation (min) (ACUTE ONLY): 9 min  Charges:  $Gait Training: 8-22 mins                    G Codes:      Kevin Austin 12/30/15, 2:34 PM [facility] PT [phone]

## 2015-12-10 NOTE — Progress Notes (Signed)
Pharmacy Antibiotic Note  Kevin Austin is a 25 y.o. male on Vancomycin for disseminated MRSA infection (blood, resp, wounds, spine). Plans for vancomycin until 12/18/15 then oral therapy with doxycycline for 1 month. SCr stable, CrCl > 100, therapeutic on vanc 1,000 mg IV q8h. Afeb, wbc wnl.   Plan: - Continue Vancomycin 1000mg  IV q8h - Per ID: Vanc through 8/22 and then switch to PO doxycycline x 58mo - VT weekly, will order for 8/15   :Height: 6' (182.9 cm) Weight: 147 lb 14.4 oz (67.1 kg) IBW/kg (Calculated) : 77.6  Temp (24hrs), Avg:98.1 F (36.7 C), Min:97.7 F (36.5 C), Max:98.6 F (37 C)   Recent Labs Lab 12/05/15 1045 12/06/15 0400 12/09/15 0415  WBC  --  6.7  --   CREATININE  --  0.60* 0.57*  VANCOTROUGH 16  --   --     Estimated Creatinine Clearance: 136.3 mL/min (by C-G formula based on SCr of 0.8 mg/dL).    Allergies  Allergen Reactions  . [facility]ac Nausea Only    [facility]  . Tramadol Nausea Only    [facility]    Antimicrobials this admission: Rifampin 7/11 > 7/19 Ceftaroline 7/11 >> 7/24 Vanc 6/26 > 7/11, 7/24>> Zosyn 6/26 >>6/27, 7/24>>7/25 Cefepime 6/26 >6/26 Ceftaz 6/27>>6/28  Dose adjustments this admission: 6/28 VT = 9 on 1 gq8h 6/30 VT = 14 on 1250 mgq8h 7/1 VT = 20 on 1500 mgq8h(drawn 3 hrs after dose) 7/2 VT = 17 on 1500 mgq8h 7/5 VT = 34 on 1500 mgq8h 7/6 VR = 10, resume at 1500 mg q12h 7/8 VT = 10 incr to 1750 mg q12h 7/10 VT = 11 incr to 1250 mg q8h 7/28 VT = 18 on 1250mg  q8h 8/5 VT = 21 ~ on time 1250 q8h 8/9 VT= 16 on 1000mg  IV q8h  Microbiology results: 6/26 BCx2: MRSA 6/26 BCID: MRSA 6/26 UCx: neg 6/26 Sputum: MRSA 6/26 MRSA PCR: pos 6/27 R hand abscess: MRSA 6/27 abscess- fungal: neg 6/28 TA: few MRSA 6/28 BCx2: neg 6/28 BCID: MSSA (?) 6/30 BCx2: neg 7/1 ankle fluid: MRSA 7/6 sternum abscess: MRSA 7/10 resp cx: MRSA 7/10 blood cx: 1/2 CoNS 7/10: UCx: negF 7/11 hip wound: negF 7/25 R  pleural fluid: ngf  Kevin BertinHaley Selma Austin, PharmD, BCPS Clinical Pharmacist Pager [phone] 12/10/2015 11:11 AM

## 2015-12-11 LAB — VANCOMYCIN, TROUGH: Vancomycin Tr: 14 ug/mL — ABNORMAL LOW (ref 15–20)

## 2015-12-11 MED ORDER — VANCOMYCIN HCL 10 G IV SOLR
1250.0000 mg | Freq: Three times a day (TID) | INTRAVENOUS | Status: DC
  Administered 2015-12-11 – 2015-12-18 (×21): 1250 mg via INTRAVENOUS
  Filled 2015-12-11 (×22): qty 1250

## 2015-12-11 NOTE — Progress Notes (Signed)
      [address]       [place] [id number]             [phone]      No new complaints  BP 112/69 (BP Location: Left Arm)   Pulse 95   Temp 97.4 F (36.3 C) (Oral)   Resp 20   Ht 6' (1.829 m)   Wt 147 lb 14.4 oz (67.1 kg)   SpO2 100%   BMI 20.06 kg/m    Intake/Output Summary (Last 24 hours) at 12/11/15 0816 Last data filed at 12/11/15 0455  Gross per 24 hour  Intake              650 ml  Output             1000 ml  Net             -350 ml    Sternal wound granulating  Continue wet to dry dressing changes  Viviann SpareSteven C. Dorris FetchHendrickson, MD [facility] [phone]

## 2015-12-11 NOTE — Progress Notes (Signed)
Occupational Therapy Treatment Patient Details Name: Kevin Austin MRN: [id number] DOB: Jun 01, 1990 Today's Date: 12/11/2015    History of present illness Pt adm with disseminated MRSA bacteremia. Pt with repeated treatment with surgical drainage of large abscesses including bil hands, bil ankles, sternum, and rt hip. Pt intubated 6/26. Self extubated and then reintubated. Trach decannulated and sternal wound vac placed 11/14/15. PMH - IV drug abuse.    OT comments  This 25 yo male admitted with above presents to acute OT with having met his basic ADL goals, needs continued work with his right hand.  Follow Up Recommendations  Outpatient OT    Equipment Recommendations  None recommended by OT       Precautions / Restrictions Precautions Precautions: None Restrictions Weight Bearing Restrictions: No RLE Weight Bearing: Weight bearing as tolerated LLE Weight Bearing: Weight bearing as tolerated       Mobility Bed Mobility Overal bed mobility: Independent                Transfers Overall transfer level: Independent                        ADL                                         General ADL Comments: Pt is independent to Mod I wtih basic ADLs--it sometimes takes him a little longer for these due to mild stiffness in right hand and decreased grip strength as well                Cognition   Behavior During Therapy: Flat affect (with occassional smiles when you joke with him) Overall Cognitive Status: Impaired/Different from baseline                  General Comments: difficulty processing playing solitare with actual cards but asking appopropriate questions as to how to play      Exercises Other Exercises Other Exercises: Issued pt yellow and red putty (he is ready at this time for the yellow only), I also gave him coins to put in putty and work on pulling them out with his right hand. I educated him on and gave him the written  sequence of the numbers I wanted him to do. I also gave him a deck of cards to work on shuffling and playing solitare. The other acivity I educated him on was picking up and laying down coins (Ieft coins with him)           Pertinent Vitals/ Pain       Pain Assessment: 0-10 Pain Score: 7  Pain Location: right hand with putty squeezing and flattening (once he stops activity the pain dissipates) Pain Descriptors / Indicators: Tightness;Pressure;Sore Pain Intervention(s): Monitored during session         Frequency Min 2X/week     Progress Toward Goals  OT Goals(current goals can now be found in the care plan section)  Progress towards OT goals: Progressing toward goals (basic ADL goals have been met)     Plan Discharge plan remains appropriate       End of Session Equipment Utilized During Treatment: Other (comment) (none)   Activity Tolerance Patient tolerated treatment well   Patient Left in bed;with call bell/phone within reach   Nurse Communication          Time:  8118-8677 OT Time Calculation (min): 36 min  Charges: OT General Charges $OT Visit: 1 Procedure OT Treatments $Self Care/Home Management : 8-22 mins $Therapeutic Activity: 8-22 mins  Almon Register [phone] 12/11/2015, 2:54 PM

## 2015-12-11 NOTE — Progress Notes (Signed)
Pharmacy Antibiotic Note  Kevin Austin is a 25 y.o. male on Vancomycin for disseminated MRSA infection (blood, resp, wounds, spine). Plans for vancomycin until 12/18/15 then oral therapy with doxycycline for 1 month. SCr stable, CrCl > 100, good UOP. Afeb, wbc wnl.  VT now slightly subtherapeutic (14) on 1g IV q8h.  Plan: - Increase Vancomycin to 1250mg  IV q8h - Per ID: Vanc through 8/22 and then switch to PO doxycycline x 60mo - Monitor renal function, clinical progress, VT at new Css   :Height: 6' (182.9 cm) Weight: 147 lb 14.4 oz (67.1 kg) IBW/kg (Calculated) : 77.6  Temp (24hrs), Avg:97.6 F (36.4 C), Min:97.4 F (36.3 C), Max:97.7 F (36.5 C)   Recent Labs Lab 12/05/15 1045 12/06/15 0400 12/09/15 0415  WBC  --  6.7  --   CREATININE  --  0.60* 0.57*  VANCOTROUGH 16  --   --     Estimated Creatinine Clearance: 136.3 mL/min (by C-G formula based on SCr of 0.8 mg/dL).    Allergies  Allergen Reactions  . [facility]ac Nausea [facility]    [facility]  . Tramadol Nausea Only    [facility]    Antimicrobials this admission: Rifampin 7/11 > 7/19 Ceftaroline 7/11 >> 7/24 Vanc 6/26 > 7/11, 7/24>> Zosyn 6/26 >>6/27, 7/24>>7/25 Cefepime 6/26 >6/26 Ceftaz 6/27>>6/28  Dose adjustments this admission: 6/28 VT = 9 on 1 gq8h 6/30 VT = 14 on 1250 mgq8h 7/1 VT = 20 on 1500 mgq8h(drawn 3 hrs after dose) 7/2 VT = 17 on 1500 mgq8h 7/5 VT = 34 on 1500 mgq8h 7/6 VR = 10, resume at 1500 mg q12h 7/8 VT = 10 incr to 1750 mg q12h 7/10 VT = 11 incr to 1250 mg q8h 7/28 VT = 18 on 1250mg  q8h 8/5 VT = 21 ~ on time 1250 q8h 8/9 VT= 16 on 1000mg  IV q8h 8/15 VT = 14 on 1g q8h  Microbiology results: 6/26 BCx2: MRSA 6/26 BCID: MRSA 6/26 UCx: neg 6/26 Sputum: MRSA 6/26 MRSA PCR: pos 6/27 R hand abscess: MRSA 6/27 abscess- fungal: neg 6/28 TA: few MRSA 6/28 BCx2: neg 6/28 BCID: MSSA (?) 6/30 BCx2: neg 7/1 ankle fluid: MRSA 7/6 sternum abscess: MRSA 7/10 resp cx:  MRSA 7/10 blood cx: 1/2 CoNS 7/10: UCx: negF 7/11 hip wound: negF 7/25 R pleural fluid: ngf  Babs BertinHaley Dravin Lance, PharmD, BCPS Clinical Pharmacist Pager [phone] 12/11/2015 11:40 AM
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
## 2015-12-12 DIAGNOSIS — L02612 Cutaneous abscess of left foot: Secondary | ICD-10-CM

## 2015-12-12 DIAGNOSIS — L02512 Cutaneous abscess of left hand: Secondary | ICD-10-CM
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
## 2015-12-13 LAB — BASIC METABOLIC PANEL
Anion gap: 7 (ref 5–15)
BUN: 19 mg/dL (ref 6–20)
CHLORIDE: 105 mmol/L (ref 101–111)
CO2: 26 mmol/L (ref 22–32)
CREATININE: 0.54 mg/dL — AB (ref 0.61–1.24)
Calcium: 9.2 mg/dL (ref 8.9–10.3)
GFR calc non Af Amer: >60 mL/min (ref >60)
Glucose, Bld: 112 mg/dL — ABNORMAL HIGH (ref 65–99)
Potassium: 3.7 mmol/L (ref 3.5–5.1)
Sodium: 138 mmol/L (ref 135–145)
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
## 2015-12-13 NOTE — Clinical Social Work Note (Signed)
Patient will finish his IV antibiotics in hospital. Patient will not need SNF placement. CSW signing off.   Shonny AycocValero Energyk, LCSW [phone]

## 2015-12-14 ENCOUNTER — Inpatient Hospital Stay (HOSPITAL_COMMUNITY): Payer: Medicaid Other

## 2015-12-14 ENCOUNTER — Encounter (HOSPITAL_COMMUNITY): Payer: Self-pay | Admitting: Radiology

## 2015-12-14 MED ORDER — IOPAMIDOL (ISOVUE-300) INJECTION 61%
INTRAVENOUS | Status: AC
  Administered 2015-12-14: 75 mL
  Filled 2015-12-14: qty 75
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
## 2015-12-14 NOTE — Progress Notes (Signed)
Occupational Therapy Treatment Patient Details Name: Kevin Austin MRN: [id number] DOB: [birth year] Today's Date: 12/14/2015    History of present illness Pt adm with disseminated MRSA bacteremia. Pt with repeated treatment with surgical drainage of large abscesses including bil hands, bil ankles, sternum, and rt hip. Pt intubated 6/26. Self extubated and then reintubated. Trach decannulated and sternal wound vac placed 11/14/15. PMH - IV drug abuse.    OT comments  Pt continues to make progress towards acute OT goals related to his R hand strength and coordination. Continued working with yellow theraputty, coins and deck of cards to facilitate this goal. Pt reports he is continued to be limited by pain, but wants to avoid taking any pain medication as he wants to remain sober. Pt also verbalized feeling of sadness, loneliness, and frustration with the lack of family support he has had lately. Worked on short/long term goal setting and had pt verbalize list of hobbies/interest that he would like to focus on during and after discharge. Will continue to follow acutely to address OT goals and needs.    Follow Up Recommendations  Outpatient OT    Equipment Recommendations  None recommended by OT    Recommendations for Other Services      Precautions / Restrictions Precautions Precautions: None Restrictions Weight Bearing Restrictions: Yes RLE Weight Bearing: Weight bearing as tolerated LLE Weight Bearing: Weight bearing as tolerated       Mobility Bed Mobility               General bed mobility comments: Pt up in chair on OT arrival  Transfers                      Balance Overall balance assessment: Needs assistance Sitting-balance support: No upper extremity supported;Feet supported Sitting balance-Leahy Scale: Good                             ADL Overall ADL's : Modified independent                                       General ADL  Comments: Pt reports his strength in his hands is increasing gradually. Pt still reports droppingnutensils occassionally, but is able to recover them and finish eating without issues. Pt reports using cards, writing with pen, and using putty to attempt to strengthen hands, but it increases his pain and he is trying to avoid all pain-relieving medication to help with avoiding opioids.       Vision                     Perception     Praxis      Cognition   Behavior During Therapy: [facility] assessed/performed Overall Cognitive Status: Within Functional Limits for tasks assessed                       Extremity/Trunk Assessment               Exercises Other Exercises Other Exercises: Continue work with yellow putty and coins for hand, pincher grasp and finger strengthening. Also worked with playing cards playing solitare and shuffling.    Shoulder Instructions       General Comments      Pertinent Vitals/ Pain       Pain  Assessment: No/denies pain  Home Living                                          Prior Functioning/Environment              Frequency Min 2X/week     Progress Toward Goals  OT Goals(current goals can now be found in the care plan section)  Progress towards OT goals: Progressing toward goals  Acute Rehab OT Goals Patient Stated Goal: to stay clean and get stronger OT Goal Formulation: With patient Time For Goal Achievement: 12/11/15 Potential to Achieve Goals: Good ADL Goals Pt Will Perform Eating: sitting;with modified independence Pt Will Perform Grooming: standing;with modified independence Pt Will Perform Upper Body Bathing: standing;with modified independence Pt Will Perform Lower Body Bathing: with modified independence;sit to/from stand Pt Will Transfer to Toilet: with modified independence;ambulating;regular height toilet Pt Will Perform Toileting - Clothing Manipulation and hygiene: with  modified independence;sit to/from stand Pt/caregiver will Perform Home Exercise Program: Increased ROM;Increased strength;Both right and left upper extremity;Independently;With written HEP provided  Plan Discharge plan remains appropriate    Co-evaluation                 End of Session     Activity Tolerance Patient tolerated treatment well   Patient Left in chair;with call bell/phone within reach   Nurse Communication Mobility status        Time: 1700-1730 OT Time Calculation (min): 30 min  Charges: OT General Charges $OT Visit: 1 Procedure OT Treatments $Therapeutic Activity: 23-37 mins  Nils PyleJulia Manami Tutor, OTR/L Pager: [phone] 12/14/2015, 5:53 PM

## 2015-12-14 NOTE — Progress Notes (Signed)
      [address]       [place] [id number]             [phone]      Some pain in sternal area.  BP 117/70 (BP Location: Left Arm)   Pulse (!) 104   Temp 98.3 F (36.8 C) (Oral)   Resp 18   Ht 6' (1.829 m)   Wt 147 lb 14.4 oz (67.1 kg)   SpO2 100%   BMI 20.06 kg/m    Intake/Output Summary (Last 24 hours) at 12/14/15 1441 Last data filed at 12/14/15 0937  Gross per 24 hour  Intake             1810 ml  Output              700 ml  Net             1110 ml   Sternal wound granulating over majority of area. There is a gap between manubrium and body of sternum. This area needs to be packed when doing dressing changes.  Salvatore DecentSteven C. Dorris FetchHendrickson, MD [facility] [phone]

## 2015-12-14 NOTE — Progress Notes (Signed)
Nutrition Follow-up  DOCUMENTATION CODES:   Not applicable  INTERVENTION:    Continue Ensure Enlive po BID, each supplement provides 350 kcal and 20 grams of protein   Continue 30 ml Prostat BID, each supplement provides 100 kcals and 15 grams protein  NUTRITION DIAGNOSIS:   Increased nutrient needs related to wound healing as evidenced by estimated needs  Ongoing  GOAL:   Patient will meet greater than or equal to 90% of their needs  Met  MONITOR:   PO intake, Supplement acceptance, Labs, Weight trends, Skin, I & O's  ASSESSMENT:   25 year old admitted with severe sepsis, multiple septic emboli. 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  Trach capped on 7/18 and pt decannulated on 11/14/15.   Pt continues on a Regular diet. PO intake 100% per flowsheet records. Sternal wound healing. Receiving Ensure Enlive & Prostat liquid protein BID. Pt will not need SNF placement >> to finish ABX in hospital.  Diet Order:  Diet regular Room service appropriate?: Yes; Fluid consistency:: Thin Diet - low sodium heart healthy  Skin:  Wound (see comment) (multiple incisions and open wounds, sternal wound vac)  Last BM:  8/17  Height:   Ht Readings from Last 1 Encounters:  11/23/15 6' (1.829 m)    Weight:   Wt Readings from Last 1 Encounters:  12/04/15 147 lb 14.4 oz (67.1 kg)    Ideal Body Weight:  78.2 kg  BMI:  Body mass index is 20.06 kg/m.  Estimated Nutritional Needs:   Kcal:  8938-1017  Protein:  140-160 gm  Fluid:  >2.3 L  EDUCATION NEEDS:   No education needs identified at this time  Arthur Holms, RD, LDN Pager #: [phone] After-Hours Pager #: [phone]

## 2015-12-15 LAB — VANCOMYCIN, TROUGH: VANCOMYCIN TR: 21 ug/mL — AB (ref 15–20)

## 2015-12-15 NOTE — Progress Notes (Signed)
Patient lying in bed, no needs at this time. Called pharmacy and resched Vancomycin, prev dose still infusing. Call light within reach
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
## 2015-12-15 NOTE — Progress Notes (Signed)
Spoke with pharmacy on regards to the Trough 21. Per pharmacy the goal 15-21. So we can give the scheduled dose.

## 2015-12-15 NOTE — Progress Notes (Signed)
Vanco. [facility]. No orders giving at this time.

## 2015-12-15 NOTE — Progress Notes (Signed)
Subjective: 39 Days Post-Op Procedure(s) (LRB): IRRIGATION AND DEBRIDEMENT HIP (Right) Patient reports pain as controlled with tylenol.  Overall feeling much better.  Working with therapy and making progress.  Objective: Vital signs in last 24 hours: Temp:  [98 F (36.7 C)-98.3 F (36.8 C)] 98 F (36.7 C) (08/19 0544) Pulse Rate:  [93-104] 93 (08/19 0544) Resp:  [18] 18 (08/19 0544) BP: (117-122)/(70-83) 122/83 (08/19 0544) SpO2:  [100 %] 100 % (08/19 0544)  Intake/Output from previous day: 08/18 0701 - 08/19 0700 In: 1800 [P.O.:960; I.V.:90; IV Piggyback:750] Out: 750 [Urine:750] Intake/Output this shift: Total I/O In: 380 [P.O.:360; I.V.:20] Out: -   No results for input(s): HGB in the last 72 hours. No results for input(s): WBC, RBC, HCT, PLT in the last 72 hours.  Recent Labs  12/13/15 0443  NA 138  K 3.7  CL 105  CO2 26  BUN 19  CREATININE 0.54*  GLUCOSE 112*  CALCIUM 9.2   No results for input(s): LABPT, INR in the last 72 hours.  intact sensation and capillary refill all digits.  +epl/fpl/io.  full  ROM of digits.  wounds fully healed.  no signs of infection or dystrophy.  no swelling.  Assessment/Plan: 39 Days Post-Op Procedure(s) (LRB): IRRIGATION AND DEBRIDEMENT HIP (Right) Doing well.  Continue with therapy for strengthening of hands/upper extremity.  May follow up in office prn after discharge.  Ashleigh Arya R 12/15/2015, 12:54 PM

## 2015-12-15 NOTE — Progress Notes (Signed)
Pharmacy Antibiotic Note  Kevin Austin is a 25 y.o. male on Vancomycin for disseminated MRSA infection (blood, resp, wounds, spine). Plans for vancomycin until 12/18/15 then oral therapy with doxycycline for 1 month. SCr stable, CrCl > 100, good UOP. l  VT now slightly above goal (21) on 1.25g IV q8h but noted was 14 on 1000mg  IV q8h  Plan: -No vancomycin dose changes - Per ID: Vanc through 8/22 and then switch to PO doxycycline x 37mo - Monitor renal function, clinical progress   :Height: 6' (182.9 cm) Weight: 147 lb 14.4 oz (67.1 kg) IBW/kg (Calculated) : 77.6  Temp (24hrs), Avg:98.1 F (36.7 C), Min:98 F (36.7 C), Max:98.3 F (36.8 C)   Recent Labs Lab 12/09/15 0415 12/11/15 1055 12/13/15 0443 12/15/15 0535  CREATININE 0.57*  --  0.54*  --   VANCOTROUGH  --  14*  --  21*    Estimated Creatinine Clearance: 136.3 mL/min (by C-G formula based on SCr of 0.8 mg/dL).    Allergies  Allergen Reactions  . Ket[facility]ac Nausea Only    [facility]  . Tramadol Nausea Only    Per [facility]    Antimicrobials this admission: Rifampin 7/11 > 7/19 Ceftaroline 7/11 >> 7/24 Vanc 6/26 > 7/11, 7/24>> Zosyn 6/26 >>6/27, 7/24>>7/25 Cefepime 6/26 >6/26 Ceftaz 6/27>>6/28  Dose adjustments this admission: 6/28 VT = 9 on 1 gq8h 6/30 VT = 14 on 1250 mgq8h 7/1 VT = 20 on 1500 mgq8h(drawn 3 hrs after dose) 7/2 VT = 17 on 1500 mgq8h 7/5 VT = 34 on 1500 mgq8h 7/6 VR = 10, resume at 1500 mg q12h 7/8 VT = 10 incr to 1750 mg q12h 7/10 VT = 11 incr to 1250 mg q8h 7/28 VT = 18 on 1250mg  q8h 8/5 VT = 21 ~ on time 1250 q8h 8/9 VT= 16 on 1000mg  IV q8h 8/15 VT = 14 on 1g q8h 8/19 VT= 21 on 1.25gm IV q8h  Microbiology results: 6/26 BCx2: MRSA 6/26 BCID: MRSA 6/26 UCx: neg 6/26 Sputum: MRSA 6/26 MRSA PCR: pos 6/27 R hand abscess: MRSA 6/27 abscess- fungal: neg 6/28 TA: few MRSA 6/28 BCx2: neg 6/28 BCID: MSSA (?) 6/30 BCx2: neg 7/1 ankle fluid: MRSA 7/6 sternum  abscess: MRSA 7/10 resp cx: MRSA 7/10 blood cx: 1/2 CoNS 7/10: UCx: negF 7/11 hip wound: negF 7/25 R pleural fluid: ngf  Kevin Austin, Pharm D 12/15/2015 10:34 AM

## 2015-12-16 NOTE — Progress Notes (Addendum)
[address]       [place] [id number]             [phone]      40 Days Post-Op Procedure(s) (LRB): IRRIGATION AND DEBRIDEMENT HIP (Right) Subjective: Feels ok, does note the movement of bone   Objective: Vital signs in last 24 hours: Temp:  [97.9 F (36.6 C)-98.4 F (36.9 C)] 98.4 F (36.9 C) (08/20 0719) Pulse Rate:  [99-103] 99 (08/20 0719) Resp:  [18-19] 19 (08/19 2010) BP: (118-125)/(74-88) 118/75 (08/20 0719) SpO2:  [100 %] 100 % (08/20 0719)  Hemodynamic parameters for last 24 hours:    Intake/Output from previous day: 08/19 0701 - 08/20 0700 In: 1100 [P.O.:1080; I.V.:20] Out: -  Intake/Output this shift: No intake/output data recorded.  General appearance: alert, cooperative and no distress Wound: excellent granulation tissue, some fibrinous tissue at sterno- manubrial separation  Lab Results: No results for input(s): WBC, HGB, HCT, PLT in the last 72 hours. BMET: No results for input(s): NA, K, CL, CO2, GLUCOSE, BUN, CREATININE, CALCIUM in the last 72 hours.  PT/INR: No results for input(s): LABPROT, INR in the last 72 hours. ABG    Component Value Date/Time   PHART 7.446 10/29/2015 0823   HCO3 37.7 (H) 10/29/2015 0823   TCO2 39 10/29/2015 0823   ACIDBASEDEF 1.0 10/22/2015 1832   O2SAT 100.0 10/29/2015 0823   CBG (last 3)  No results for input(s): GLUCAP in the last 72 hours.  Meds Scheduled Meds: . antiseptic oral rinse  7 mL Mouth Rinse BID  . enoxaparin (LOVENOX) injection  40 mg Subcutaneous Q24H  . feeding supplement (ENSURE ENLIVE)  237 mL Oral BID BM  . feeding supplement (PRO-STAT SUGAR FREE 64)  30 mL Oral BID  . LORazepam  0.5 mg Oral BID  . multivitamin with minerals  1 tablet Oral Daily  . nicotine  21 mg Transdermal Daily  . oxyCODONE  15 mg Oral Q12H  . pantoprazole  40 mg Oral Daily  . polyethylene glycol  17 g Oral Daily  . QUEtiapine  75 mg Oral Q12H  . senna-docusate  2 tablet Oral BID  . sodium  chloride flush  10-40 mL Intracatheter Q12H  . vancomycin  1,250 mg Intravenous Q8H   Continuous Infusions:  PRN Meds:.sodium chloride, acetaminophen, ibuprofen, LORazepam, ondansetron (ZOFRAN) IV, oxyCODONE, sodium chloride flush  Xrays Ct Soft Tissue Neck W Contrast  Result Date: 12/14/2015 CLINICAL DATA:  25 year old male with history of IV drug use, sepsis, septic emboli, sternal and lumbosacral osteomyelitis. Neck pain at up prior tracheostomy site. Initial encounter. EXAM: CT NECK WITH CONTRAST TECHNIQUE: Multidetector CT imaging of the neck was performed using the standard protocol following the bolus administration of intravenous contrast. CONTRAST:  100 mL Omnipaque 300. COMPARISON:  Brain MRI 11/21/2015. Cervical spine CT 07/07/2013. Chest CT 11/18/2015. FINDINGS: Pharynx and larynx: Mild superficial soft tissue irregularity (series 6, image 97) at the level of the inferior thyroid corresponding to likely site of tracheostomy. There is nonenhancing linear probable granulation tissue extending from this skin site, between the thyroid lobes, to the anterior wall of the trachea (same image). There is a small area of lobulated soft tissue along the anterior wall of the trachea, but no tracheal stenosis. No regional inflammation is evident. Salivary glands: Negative sublingual space. Submandibular and parotid glands are within normal limits. Thyroid: The soft tissue changes from prior tracheostomy extend just caudal to the thyroid isthmus between the medial thyroid  lobes, but the thyroid gland otherwise appears normal. Lymph nodes: No cervical lymphadenopathy. Vascular: Major vascular structures in the neck and at the skullbase appear patent. Both internal jugular veins are patent. There is a right subclavian approach central line partially visible. Limited intracranial: Negative. Visualized orbits: Negative. Mastoids and visualized paranasal sinuses: Minimal bubbly opacity in the left sphenoid sinus.  Other Visualized paranasal sinuses and mastoids are stable and well pneumatized. Skeleton: Stable cervical vertebral height and alignment. Chronic endplate irregularity at C5 and at C6 is stable. Subtotal erosion of the sternum with sternomanubrial gas and overlying soft tissue defect with dressing in place is demonstrated. Lytic changes of the sternum have progressed since July. The sternoclavicular joints appear stable and relatively spared. There is no longer any retrosternal gas. Mild retrosternal soft tissue thickening and stranding is stable. The anterior mediastinum appears only mildly affected. No retrosternal abscess is evident, the entire sternum and mediastinum are not included. Upper chest: See sternal findings in the skeleton section above. Improved ventilation in the visualized upper lungs. Mild reactive superior mediastinal lymphadenopathy. IMPRESSION: 1. Progressed sternal erosions from osteomyelitis since 11/18/2015. Gas within the sternomanubrial joint with overlying soft tissue wound re- demonstrated. Visualized retrosternal soft tissues demonstrate reactive inflammation and mild lymphadenopathy. 2. Expected soft tissue healing at prior tracheostomy site. Mild soft tissue irregularity along the anterior wall of the trachea. No associated abscess or acute inflammation. 3. Other neck soft tissues are within normal limits. 4. Stable mild endplate irregularity at C5 and C6 which may reflect prior discitis. Electronically Signed   By: Odessa FlemingH  Hall M.D.   On: 12/14/2015 14:19    Assessment/Plan: S/P Procedure(s) (LRB): IRRIGATION AND DEBRIDEMENT HIP (Right)  1 cont wound care, hopefully we will be able to avoid further sternal resection/debridement    LOS: 55 days    GOLD,WAYNE E 12/16/2015  Patient seen and examined, agree with above For DC today. His sternal wound is still not healed but is granulating and clean. I will see him back in the office in a couple of weeks  Viviann SpareSteven C.  Dorris FetchHendrickson, MD [facility] [phone]
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
## 2015-12-17 ENCOUNTER — Inpatient Hospital Stay: Payer: Self-pay | Admitting: Internal Medicine

## 2015-12-17 LAB — CBC
HEMATOCRIT: 33.4 % — AB (ref 39.0–52.0)
Hemoglobin: 10.2 g/dL — ABNORMAL LOW (ref 13.0–17.0)
MCH: 24.9 pg — AB (ref 26.0–34.0)
MCHC: 30.5 g/dL (ref 30.0–36.0)
MCV: 81.5 fL (ref 78.0–100.0)
PLATELETS: 215 10*3/uL (ref 150–400)
RBC: 4.1 MIL/uL — ABNORMAL LOW (ref 4.22–5.81)
RDW: 17.2 % — AB (ref 11.5–15.5)
WBC: 3.6 10*3/uL — ABNORMAL LOW (ref 4.0–10.5)

## 2015-12-17 MED ORDER — ALTEPLASE 2 MG IJ SOLR
2.0000 mg | Freq: Once | INTRAMUSCULAR | Status: AC
  Administered 2015-12-17: 2 mg
  Filled 2015-12-17: qty 2

## 2015-12-17 MED ORDER — IOPAMIDOL (ISOVUE-300) INJECTION 61%
75.0000 mL | Freq: Once | INTRAVENOUS | Status: AC | PRN
  Administered 2015-12-14: 75 mL via INTRAVENOUS

## 2015-12-17 MED ORDER — ALTEPLASE 2 MG IJ SOLR
2.0000 mg | Freq: Once | INTRAMUSCULAR | Status: AC
  Administered 2015-12-17: 2 mg
  Filled 2015-12-17 (×2): qty 2

## 2015-12-17 NOTE — Progress Notes (Signed)
Occupational Therapy Treatment Patient Details Name: Jolene Schimkerey Kuch MRN: [id number] DOB: [birth year] Today's Date: 12/17/2015    History of present illness Pt adm with disseminated MRSA bacteremia. Pt with repeated treatment with surgical drainage of large abscesses including bil hands, bil ankles, sternum, and rt hip. Pt intubated 6/26. Self extubated and then reintubated. Trach decannulated and sternal wound vac placed 11/14/15. PMH - IV drug abuse.    OT comments  Pt continues to demonstrate progress with UE strength and functional use of BUE. Motivated to improve. Will continue to follow acutely.   Follow Up Recommendations  Outpatient OT    Equipment Recommendations  None recommended by OT    Recommendations for Other Services      Precautions / Restrictions Precautions Precautions: None Restrictions Weight Bearing Restrictions: Yes RLE Weight Bearing: Weight bearing as tolerated LLE Weight Bearing: Weight bearing as tolerated       Mobility Bed Mobility    independent                                                                    ADL completing ADL with set up                                                Vision                     Perception     Praxis      Cognition   Behavior During Therapy: [facility]WFL for tasks assessed/performed Overall Cognitive Status: Within Functional Limits for tasks assessed                       Extremity/Trunk Assessment               Exercises Hand Exercises Wrist Flexion: AROM;Right;10 reps Wrist Extension: AROM;Right;10 reps Digit Composite Abduction: AROM;Right;10 reps Digit Composite Adduction: AROM;Right;10 reps Digit Lifts: AROM;Right;10 reps Thumb Abduction: AROM;Right;10 reps Thumb Adduction: AROM;Right;10 reps Other Exercises Other Exercises: digit ab/add against resistance x 10 reps Other Exercises: level 2 theraputty strengthening ex    Shoulder Instructions       General Comments      Pertinent Vitals/ Pain       Pain Assessment: Faces Faces Pain Scale: Hurts a little bit Pain Location: R hand Pain Descriptors / Indicators: Discomfort Pain Intervention(s): Limited activity within patient's tolerance  Home Living                                          Prior Functioning/Environment              Frequency Min 2X/week     Progress Toward Goals  OT Goals(current goals can now be found in the care plan section)  Progress towards OT goals: Progressing toward goals  Acute Rehab OT Goals Patient Stated Goal: to stay clean and get stronger OT Goal Formulation: With patient Time For Goal Achievement: 12/21/15 Potential to Achieve Goals: Good ADL Goals Pt Will Perform  Eating: sitting;with modified independence Pt Will Perform Grooming: standing;with modified independence Pt Will Perform Upper Body Bathing: standing;with modified independence Pt Will Perform Lower Body Bathing: with modified independence;sit to/from stand Pt Will Transfer to Toilet: with modified independence;ambulating;regular height toilet Pt Will Perform Toileting - Clothing Manipulation and hygiene: with modified independence;sit to/from stand Pt/caregiver will Perform Home Exercise Program: Increased ROM;Increased strength;Both right and left upper extremity;Independently;With written HEP provided  Plan Discharge plan remains appropriate    Co-evaluation                 End of Session     Activity Tolerance Patient tolerated treatment well   Patient Left in bed;with call bell/phone within reach   Nurse Communication Mobility status        Time: [id number] OT Time Calculation (min): 16 min  Charges: OT General Charges $OT Visit: 1 Procedure OT Treatments $Therapeutic Activity: 8-22 mins  Yancy Hascall,HILLARY 12/17/2015, 2:29 PM   [facility] Wlliam Grosso, OTR/L  [phone] 12/17/2015
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
## 2015-12-18 MED ORDER — LORAZEPAM 0.5 MG PO TABS: 0.5000 mg | ORAL_TABLET | Freq: Every day | ORAL | 0 refills | Status: AC | PRN

## 2015-12-18 MED ORDER — ADULT MULTIVITAMIN W/MINERALS CH: 1.0000 | ORAL_TABLET | Freq: Every day | ORAL | 0 refills | Status: AC

## 2015-12-18 MED ORDER — NICOTINE 21 MG/24HR TD PT24: 21.0000 mg | MEDICATED_PATCH | Freq: Every day | TRANSDERMAL | 0 refills | Status: DC

## 2015-12-18 MED ORDER — DOXYCYCLINE HYCLATE 100 MG PO CAPS: 100.0000 mg | ORAL_CAPSULE | Freq: Two times a day (BID) | ORAL | 0 refills | Status: DC

## 2015-12-18 MED ORDER — FERROUS SULFATE 325 (65 FE) MG PO TABS: 325.0000 mg | ORAL_TABLET | Freq: Two times a day (BID) | ORAL | Status: DC

## 2015-12-18 NOTE — Progress Notes (Signed)
Reviewed discharge. Home health arranged. PICC removed. Wheeled out via wheel chair.   Cathan Gearin, Charlaine DaltonAnn Brooke RN

## 2015-12-18 NOTE — Care Management Note (Addendum)
Case Management Note Previous CM note initiated by Lawerance Sabalebbie Swist, RN 11/12/2015, 2:46 PM  Patient Details  Name: Kevin Austin MRN: [id number] Date of Birth: May 06, 1990  Subjective/Objective:                 Patient transferred from [facility] over the weekend. Admitted with multiple septic emboli (sternum, lungs, kidney, etc.) MRSA bacteremia, Cavitary PNA, AMS. Has trach and pulled out feeding tube this morning, now eating.   Per ID consult: "Disseminated MRSA bacteremia: Mr. Kevin Austin is now greatly improved and afebrile for several days. Given his extensive infection and osteomyelitis he will need at least 6 weeks of IV antibiotics, and likely to continue oral treatment after that time. We can probably treat his start of ceftaroline on 7/11 as the beginning date since fevers did not end and surgeries were ongoing until then. Also consider that we are treating with rifampin and this can decrease the effect of methadone. PLAN: Continue ceftaroline and rifampin through at least 8/22, can take rifampin PO. Evaluate clinically if methadone dose adjustment is needed on rifampin." Per notes, CSW consulted for SNF placement 2/2 wounds and h/o IV drug use needing long term IV Abx with PICC.   Action/Plan:  8/14- pt has not been able to be placed in SNF per CSW- plan will be to finish IV abx here then d/c home  Anticipate DC to SNF.  Expected Discharge Date:    12/18/15             Expected Discharge Plan:  Home w Home Health Services  In-House Referral:  Clinical Social Work  Discharge planning Services  CM Consult  Post Acute Care Choice:  Home Health Choice offered to:  NA  DME Arranged:  N/A DME Agency:  NA  HH Arranged:  RN [facility]  Status of Service:  Completed, signed off  If discussed at Long Length of Stay Meetings, dates discussed:    Additional Comments:  12/19/15- post discharge f/u- received call from pt's mom- [facility]  [facility]- were told pt not in system [facility] call made to Brett CanalesSteve at the pharmacy and situation has been cleared up and abx ready for pickup- (PDMI system back up and [facility])- also have [facility]-CSW director regarding [facility] under [facility]- pt has been approved and working with [facility] to provide these services under LOG contract until pt f/u with Dr. Dorris FetchHendrickson 9/12. Have spoken with pt's mother to inform her of HH arrangements- mother states that she has a neighbor that can assist her to change drsg for today- questions answered regarding pt's anti-anxiety medications- No further needs at this time.   12/18/15- 1230/1400- Sander RadonKristi Roger Kettles rN, CM- pt for d/c home today has finished his IV abx tx here- CSW to see pt in reference to drug rehab resources- pt will need HHRN for sternal wound care/drsg changes- pt is uninsured- [facility]-  Spoke with pt at bedside regarding someone at home that may can assist pt with drsg changes- pt states that he feels his grandmother or mother can assist him with him doing most of it (per Darl PikesSusan with [facility] who has spoken with grandmother-says grandmother is not willing to assist) will need to see if pt can do own drsg change before he discharges- Also spoke with pt  regarding medications needs- is eligible for assistance with MATCH for abx- [facility] willing to fill under MATCH- will have pt go there as MATCH system currently down and unable to give pt a MATCH letter.  Update- 1430- pt discharge prior to CM f/u with him regarding where to go to get abx- and before [facility] needs- [facility]- will let pt know if he has any difficulty getting his abx- to call CM.  Spoke with pt's mother- Adella HareHeather Hall [phone])- regarding pt's abx and getting them [facility] using pt assistance- MATCH-  situation explained to mom- and plan will be to bring script to pharmacy in am as they can not return tonight before pharmacy closes- Have also heard from [facility] that they will not be able to take referral due to not working in pt's zip code area- have called to reach out to other [facility] agencies and no other agency taking charity cases at this time- will speak with CSW director in am regarding possible LOG [facility].  Zenda AlpersWebster, Lazy Y UKristi Hall, RN 12/18/2015, 12:34 PM [phone]
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
## 2015-12-19 MED FILL — DOXYCYCLINE HYCLATE 100 MG: 100 | 30 days supply | Qty: 60 | Fill #0

## 2016-01-07 ENCOUNTER — Other Ambulatory Visit: Payer: Self-pay | Admitting: Thoracic Surgery (Cardiothoracic Vascular Surgery)

## 2016-01-07 DIAGNOSIS — J90 Pleural effusion, not elsewhere classified: Secondary | ICD-10-CM

## 2016-01-08 ENCOUNTER — Encounter (HOSPITAL_COMMUNITY): Payer: Self-pay | Admitting: General Practice

## 2016-01-08 ENCOUNTER — Inpatient Hospital Stay (HOSPITAL_COMMUNITY)
Admission: AD | Admit: 2016-01-08 | Discharge: 2016-01-16 | DRG: 493 | Disposition: A | Discharge status: Home / Self Care | Payer: Medicaid Other | Source: Other Acute Inpatient Hospital | Attending: Internal Medicine | Admitting: Internal Medicine

## 2016-01-08 ENCOUNTER — Inpatient Hospital Stay (HOSPITAL_COMMUNITY): Payer: Medicaid Other

## 2016-01-08 ENCOUNTER — Encounter: Payer: Self-pay | Admitting: Thoracic Surgery (Cardiothoracic Vascular Surgery)

## 2016-01-08 DIAGNOSIS — Z23 Encounter for immunization: Secondary | ICD-10-CM | POA: Diagnosis not present

## 2016-01-08 DIAGNOSIS — B1920 Unspecified viral hepatitis C without hepatic coma: Secondary | ICD-10-CM | POA: Diagnosis present

## 2016-01-08 DIAGNOSIS — R21 Rash and other nonspecific skin eruption: Secondary | ICD-10-CM | POA: Diagnosis present

## 2016-01-08 DIAGNOSIS — D509 Iron deficiency anemia, unspecified: Secondary | ICD-10-CM | POA: Diagnosis present

## 2016-01-08 DIAGNOSIS — D62 Acute posthemorrhagic anemia: Secondary | ICD-10-CM | POA: Diagnosis not present

## 2016-01-08 DIAGNOSIS — Z79899 Other long term (current) drug therapy: Secondary | ICD-10-CM | POA: Diagnosis not present

## 2016-01-08 DIAGNOSIS — R7401 Elevation of levels of liver transaminase levels: Secondary | ICD-10-CM | POA: Diagnosis present

## 2016-01-08 DIAGNOSIS — R740 Nonspecific elevation of levels of transaminase and lactic acid dehydrogenase [LDH]: Secondary | ICD-10-CM

## 2016-01-08 DIAGNOSIS — M79671 Pain in right foot: Secondary | ICD-10-CM | POA: Diagnosis present

## 2016-01-08 DIAGNOSIS — Z22322 Carrier or suspected carrier of Methicillin resistant Staphylococcus aureus: Secondary | ICD-10-CM

## 2016-01-08 DIAGNOSIS — B9562 Methicillin resistant Staphylococcus aureus infection as the cause of diseases classified elsewhere: Secondary | ICD-10-CM | POA: Diagnosis present

## 2016-01-08 DIAGNOSIS — F419 Anxiety disorder, unspecified: Secondary | ICD-10-CM | POA: Diagnosis present

## 2016-01-08 DIAGNOSIS — F1910 Other psychoactive substance abuse, uncomplicated: Secondary | ICD-10-CM | POA: Diagnosis present

## 2016-01-08 DIAGNOSIS — Z6824 Body mass index (BMI) 24.0-24.9, adult: Secondary | ICD-10-CM

## 2016-01-08 DIAGNOSIS — Z789 Other specified health status: Secondary | ICD-10-CM | POA: Diagnosis not present

## 2016-01-08 DIAGNOSIS — M86161 Other acute osteomyelitis, right tibia and fibula: Secondary | ICD-10-CM | POA: Diagnosis present

## 2016-01-08 DIAGNOSIS — Z792 Long term (current) use of antibiotics: Secondary | ICD-10-CM

## 2016-01-08 DIAGNOSIS — M25571 Pain in right ankle and joints of right foot: Secondary | ICD-10-CM

## 2016-01-08 DIAGNOSIS — F17210 Nicotine dependence, cigarettes, uncomplicated: Secondary | ICD-10-CM | POA: Diagnosis present

## 2016-01-08 DIAGNOSIS — M869 Osteomyelitis, unspecified: Secondary | ICD-10-CM

## 2016-01-08 HISTORY — DX: Septic pulmonary embolism without acute cor pulmonale: I26.90

## 2016-01-08 HISTORY — DX: Bacteremia: R78.81

## 2016-01-08 HISTORY — DX: Cutaneous abscess of chest wall: L02.213

## 2016-01-08 HISTORY — DX: Osteomyelitis of vertebra, sacral and sacrococcygeal region: M46.28

## 2016-01-08 HISTORY — DX: Pyogenic arthritis, unspecified: M00.9

## 2016-01-08 HISTORY — DX: Other acute osteomyelitis, unspecified femur: M86.159

## 2016-01-08 HISTORY — DX: Osteomyelitis, unspecified: M86.9

## 2016-01-08 LAB — CBC WITH DIFFERENTIAL/PLATELET
BASOS ABS: 0 10*3/uL (ref 0.0–0.1)
BASOS PCT: 0 %
EOS ABS: 0.2 10*3/uL (ref 0.0–0.7)
Eosinophils Relative: 4 %
HEMATOCRIT: 36.2 % — AB (ref 39.0–52.0)
HEMOGLOBIN: 11.4 g/dL — AB (ref 13.0–17.0)
Lymphocytes Relative: 44 %
Lymphs Abs: 2.4 10*3/uL (ref 0.7–4.0)
MCH: 24.9 pg — ABNORMAL LOW (ref 26.0–34.0)
MCHC: 31.5 g/dL (ref 30.0–36.0)
MCV: 79 fL (ref 78.0–100.0)
Monocytes Absolute: 0.3 10*3/uL (ref 0.1–1.0)
Monocytes Relative: 6 %
NEUTROS ABS: 2.4 10*3/uL (ref 1.7–7.7)
NEUTROS PCT: 46 %
Platelets: 184 10*3/uL (ref 150–400)
RBC: 4.58 MIL/uL (ref 4.22–5.81)
RDW: 16.1 % — ABNORMAL HIGH (ref 11.5–15.5)
WBC: 5.3 10*3/uL (ref 4.0–10.5)

## 2016-01-08 LAB — COMPREHENSIVE METABOLIC PANEL
ALK PHOS: 163 U/L — AB (ref 38–126)
ALT: 221 U/L — ABNORMAL HIGH (ref 17–63)
ANION GAP: 8 (ref 5–15)
AST: 98 U/L — ABNORMAL HIGH (ref 15–41)
Albumin: 3.5 g/dL (ref 3.5–5.0)
BILIRUBIN TOTAL: 0.8 mg/dL (ref 0.3–1.2)
BUN: 13 mg/dL (ref 6–20)
CALCIUM: 9.5 mg/dL (ref 8.9–10.3)
CO2: 26 mmol/L (ref 22–32)
CREATININE: 0.63 mg/dL (ref 0.61–1.24)
Chloride: 103 mmol/L (ref 101–111)
Glucose, Bld: 122 mg/dL — ABNORMAL HIGH (ref 65–99)
Potassium: 3.7 mmol/L (ref 3.5–5.1)
SODIUM: 137 mmol/L (ref 135–145)
TOTAL PROTEIN: 7.2 g/dL (ref 6.5–8.1)

## 2016-01-08 LAB — C-REACTIVE PROTEIN: CRP: 0.8 mg/dL (ref <1.0)

## 2016-01-08 LAB — MRSA PCR SCREENING: MRSA BY PCR: NEGATIVE

## 2016-01-08 LAB — SEDIMENTATION RATE: SED RATE: 7 mm/h (ref 0–16)

## 2016-01-08 MED ORDER — IBUPROFEN 200 MG PO TABS
600.0000 mg | ORAL_TABLET | Freq: Four times a day (QID) | ORAL | Status: DC | PRN
  Administered 2016-01-08 – 2016-01-09 (×4): 600 mg via ORAL
  Filled 2016-01-08 (×4): qty 3

## 2016-01-08 MED ORDER — ACETAMINOPHEN 650 MG RE SUPP: 650.0000 mg | Freq: Four times a day (QID) | RECTAL | Status: DC | PRN

## 2016-01-08 MED ORDER — ENSURE ENLIVE PO LIQD
237.0000 mL | Freq: Two times a day (BID) | ORAL | Status: DC
  Administered 2016-01-08 – 2016-01-16 (×16): 237 mL via ORAL

## 2016-01-08 MED ORDER — GADOBENATE DIMEGLUMINE 529 MG/ML IV SOLN
15.0000 mL | Freq: Once | INTRAVENOUS | Status: AC
  Administered 2016-01-08: 15 mL via INTRAVENOUS

## 2016-01-08 MED ORDER — DOXYCYCLINE HYCLATE 100 MG PO TABS
100.0000 mg | ORAL_TABLET | Freq: Two times a day (BID) | ORAL | Status: DC
  Administered 2016-01-08 – 2016-01-10 (×5): 100 mg via ORAL
  Filled 2016-01-08 (×5): qty 1

## 2016-01-08 MED ORDER — ACETAMINOPHEN 325 MG PO TABS
650.0000 mg | ORAL_TABLET | Freq: Four times a day (QID) | ORAL | Status: DC | PRN
  Administered 2016-01-09 – 2016-01-15 (×8): 650 mg via ORAL
  Filled 2016-01-08 (×8): qty 2

## 2016-01-08 MED ORDER — MUPIROCIN CALCIUM 2 % EX CREA
TOPICAL_CREAM | Freq: Every day | CUTANEOUS | Status: DC
  Administered 2016-01-08: 16:00:00 via TOPICAL
  Administered 2016-01-09: 1 via TOPICAL
  Administered 2016-01-11 – 2016-01-16 (×6): via TOPICAL
  Filled 2016-01-08: qty 15

## 2016-01-08 MED ORDER — LORAZEPAM 0.5 MG PO TABS: 0.5000 mg | ORAL_TABLET | Freq: Every day | ORAL | Status: DC | PRN

## 2016-01-08 MED ORDER — ACETAMINOPHEN 325 MG PO TABS: 650.0000 mg | ORAL_TABLET | Freq: Four times a day (QID) | ORAL | Status: DC | PRN

## 2016-01-08 MED ORDER — ENOXAPARIN SODIUM 40 MG/0.4ML ~~LOC~~ SOLN
40.0000 mg | SUBCUTANEOUS | Status: DC
Start: 2016-01-08 — End: 2016-01-16
  Administered 2016-01-08 – 2016-01-15 (×8): 40 mg via SUBCUTANEOUS
  Filled 2016-01-08 (×8): qty 0.4
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
## 2016-01-08 NOTE — Care Management Note (Signed)
Case Management Note  Patient Details  Name: Kevin Austin MRN: [id number] Date of Birth: Oct 12, 1990  Subjective/Objective:  Pt in with Rt ankle pain. He is from home with his parents and [facility]                  Action/Plan: Plan will be to discharge home once medically stable. CM following for d/c needs.   Expected Discharge Date:                  Expected Discharge Plan:  Home/Self Care  In-House Referral:     Discharge planning Services     Post Acute Care Choice:    Choice offered to:     DME Arranged:    DME Agency:     HH Arranged:    HH Agency:     Status of Service:  In process, will continue to follow  If discussed at Long Length of Stay Meetings, dates discussed:    Additional Comments:  Kermit BaloKelli F Ainsley Sanguinetti, RN 01/08/2016, 3:56 PM
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
## 2016-01-08 NOTE — Consult Note (Addendum)
WOC Nurse wound consult note Reason for Consult: Patient is familiar to WOC from previous admission.  Refer to progress notes on 8/4, sternal wound has greatly improved since previus consult. Wound type: Chronic full thickness healing post-op incision Measurement: 7X2.2X.1cm Wound bed: 100% beefy red Drainage (amount, consistency, odor) small amt yellow drainage, no odor Periwound: surrounded by pink dry scar tissue to wound edges Dressing procedure/placement/frequency: Bactroban to promote moist healing, foam dressing to protect from further injury. Please re-consult if further assistance is needed.  Thank-you,  Cammie Mcgeeawn Kadince Boxley MSN, RN, CWOCN, [place]AP, CNS [phone]

## 2016-01-09 DIAGNOSIS — F419 Anxiety disorder, unspecified: Secondary | ICD-10-CM

## 2016-01-09 DIAGNOSIS — B1920 Unspecified viral hepatitis C without hepatic coma: Secondary | ICD-10-CM | POA: Diagnosis present

## 2016-01-09 DIAGNOSIS — D509 Iron deficiency anemia, unspecified: Secondary | ICD-10-CM

## 2016-01-09 DIAGNOSIS — M25571 Pain in right ankle and joints of right foot: Secondary | ICD-10-CM

## 2016-01-09 DIAGNOSIS — R740 Nonspecific elevation of levels of transaminase and lactic acid dehydrogenase [LDH]: Secondary | ICD-10-CM

## 2016-01-09 MED ORDER — IBUPROFEN 200 MG PO TABS
800.0000 mg | ORAL_TABLET | Freq: Four times a day (QID) | ORAL | Status: DC | PRN
  Administered 2016-01-09 – 2016-01-16 (×2): 800 mg via ORAL
  Filled 2016-01-09 (×2): qty 4

## 2016-01-09 NOTE — Anesthesia Preprocedure Evaluation (Addendum)
Anesthesia Evaluation  Patient identified by MRN, date of birth, ID band Patient awake    Reviewed: Allergy & Precautions, NPO status , Patient's Chart, lab work & pertinent test results  Airway Mallampati: II  TM Distance: >3 FB Neck ROM: Full    Dental no notable dental hx.    Pulmonary neg pulmonary ROS, Current Smoker,    Pulmonary exam normal breath sounds clear to auscultation- rhonchi       Cardiovascular negative cardio ROS Normal cardiovascular exam Rhythm:Regular Rate:Normal + Systolic murmurs Echo 10/24/2015 - Left ventricle: The cavity size was normal. Wall thickness was normal. Systolic function was normal. The estimated ejection fraction was in the range of 60% to 65%. Wall motion was normal; there were no regional wall motion abnormalities. - Aortic valve: No evidence of vegetation. There was no stenosis. - Aorta: Normal caliber thoracic aorta. - Mitral valve: No evidence of vegetation. - Left atrium: No evidence of thrombus in the atrial cavity or appendage. - Right ventricle: The cavity size was normal. Systolic function was normal. - Right atrium: No evidence of thrombus in the atrial cavity or appendage. - Atrial septum: No defect or patent foramen ovale was identified. - Tricuspid valve: No evidence of vegetation. - Pulmonic valve: No evidence of vegetation.  Impressions: - No evidence of endocarditis.   Neuro/Psych negative neurological ROS  negative psych ROS   GI/Hepatic negative GI ROS, (+) Hepatitis -, C  Endo/Other  negative endocrine ROS  Renal/GU negative Renal ROS  negative genitourinary   Musculoskeletal negative musculoskeletal ROS (+)   Abdominal   Peds negative pediatric ROS (+)  Hematology  (+) anemia ,   Anesthesia Other Findings   Reproductive/Obstetrics negative OB ROS                            Lab Results  Component Value Date   WBC 5.3  01/08/2016   HGB 11.4 (L) 01/08/2016   HCT 36.2 (L) 01/08/2016   MCV 79.0 01/08/2016   PLT 184 01/08/2016   Lab Results  Component Value Date   CREATININE 0.63 01/08/2016   BUN 13 01/08/2016   NA 137 01/08/2016   K 3.7 01/08/2016   CL 103 01/08/2016   CO2 26 01/08/2016    Anesthesia Physical  Anesthesia Plan  ASA: III  Anesthesia Plan: General   Post-op Pain Management:    Induction: Intravenous  Airway Management Planned: LMA  Additional Equipment:   Intra-op Plan:   Post-operative Plan: Extubation in OR  Informed Consent: I have reviewed the patients History and Physical, chart, labs and discussed the procedure including the risks, benefits and alternatives for the proposed anesthesia with the patient or authorized representative who has indicated his/her understanding and acceptance.   Dental advisory given  Plan Discussed with: CRNA  Anesthesia Plan Comments:       Anesthesia Quick Evaluation

## 2016-01-09 NOTE — Progress Notes (Addendum)
Pt without PCP or insurance listed. CM met with Kevin Austin and asked about him obtaining a PCP in the [place] area. Pt interested in the [facility] in [place]. Information placed on the AVS. Pt will have to go to the clinic and fill out paperwork and take information they request to obtain an appointment. Pt will probably need assistance with medications at discharge depending on the cost of the meds. Pt was active with [facility] prior to admission and would like to continue with them if needed at discharge. CM will follow for additional d/c needs.
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
## 2016-01-10 ENCOUNTER — Encounter (HOSPITAL_COMMUNITY)
Admission: AD | Disposition: A | Discharge status: Home / Self Care | Payer: Self-pay | Source: Other Acute Inpatient Hospital | Attending: Internal Medicine

## 2016-01-10 ENCOUNTER — Inpatient Hospital Stay (HOSPITAL_COMMUNITY): Payer: Medicaid Other | Admitting: Certified Registered Nurse Anesthetist

## 2016-01-10 ENCOUNTER — Encounter (HOSPITAL_COMMUNITY): Payer: Self-pay | Admitting: Anesthesiology

## 2016-01-10 DIAGNOSIS — F1910 Other psychoactive substance abuse, uncomplicated: Secondary | ICD-10-CM

## 2016-01-10 DIAGNOSIS — M86161 Other acute osteomyelitis, right tibia and fibula: Principal | ICD-10-CM

## 2016-01-10 DIAGNOSIS — Z22322 Carrier or suspected carrier of Methicillin resistant Staphylococcus aureus: Secondary | ICD-10-CM

## 2016-01-10 DIAGNOSIS — B9689 Other specified bacterial agents as the cause of diseases classified elsewhere: Secondary | ICD-10-CM

## 2016-01-10 HISTORY — PX: I & D EXTREMITY: SHX5045

## 2016-01-10 LAB — COMPREHENSIVE METABOLIC PANEL
ALT: 223 U/L — ABNORMAL HIGH (ref 17–63)
ANION GAP: 8 (ref 5–15)
AST: 103 U/L — ABNORMAL HIGH (ref 15–41)
Albumin: 3.8 g/dL (ref 3.5–5.0)
Alkaline Phosphatase: 167 U/L — ABNORMAL HIGH (ref 38–126)
BUN: 14 mg/dL (ref 6–20)
CALCIUM: 9.4 mg/dL (ref 8.9–10.3)
CHLORIDE: 105 mmol/L (ref 101–111)
CO2: 26 mmol/L (ref 22–32)
Creatinine, Ser: 0.67 mg/dL (ref 0.61–1.24)
Glucose, Bld: 102 mg/dL — ABNORMAL HIGH (ref 65–99)
Potassium: 4 mmol/L (ref 3.5–5.1)
SODIUM: 139 mmol/L (ref 135–145)
Total Bilirubin: 0.6 mg/dL (ref 0.3–1.2)
Total Protein: 7.5 g/dL (ref 6.5–8.1)

## 2016-01-10 LAB — CBC WITH DIFFERENTIAL/PLATELET
Basophils Absolute: 0 10*3/uL (ref 0.0–0.1)
Basophils Relative: 0 %
EOS ABS: 0.1 10*3/uL (ref 0.0–0.7)
EOS PCT: 2 %
HCT: 37 % — ABNORMAL LOW (ref 39.0–52.0)
Hemoglobin: 11.7 g/dL — ABNORMAL LOW (ref 13.0–17.0)
LYMPHS ABS: 2.1 10*3/uL (ref 0.7–4.0)
Lymphocytes Relative: 33 %
MCH: 24.9 pg — AB (ref 26.0–34.0)
MCHC: 31.6 g/dL (ref 30.0–36.0)
MCV: 78.7 fL (ref 78.0–100.0)
MONO ABS: 0.6 10*3/uL (ref 0.1–1.0)
MONOS PCT: 9 %
Neutro Abs: 3.7 10*3/uL (ref 1.7–7.7)
Neutrophils Relative %: 56 %
PLATELETS: 200 10*3/uL (ref 150–400)
RBC: 4.7 MIL/uL (ref 4.22–5.81)
RDW: 15.4 % (ref 11.5–15.5)
WBC: 6.5 10*3/uL (ref 4.0–10.5)

## 2016-01-10 SURGERY — IRRIGATION AND DEBRIDEMENT EXTREMITY
Anesthesia: General | Laterality: Right

## 2016-01-10 MED ORDER — OXYCODONE HCL 5 MG PO TABS
ORAL_TABLET | ORAL | Status: AC
  Filled 2016-01-10: qty 2

## 2016-01-10 MED ORDER — SODIUM CHLORIDE 0.9 % IR SOLN
Status: DC | PRN
  Administered 2016-01-10 (×2): 3000 mL

## 2016-01-10 MED ORDER — HYDROMORPHONE HCL 1 MG/ML IJ SOLN
0.2500 mg | INTRAMUSCULAR | Status: DC | PRN
  Administered 2016-01-10 (×4): 0.5 mg via INTRAVENOUS

## 2016-01-10 MED ORDER — MIDAZOLAM HCL 5 MG/5ML IJ SOLN
INTRAMUSCULAR | Status: DC | PRN
  Administered 2016-01-10: 2 mg via INTRAVENOUS

## 2016-01-10 MED ORDER — LIDOCAINE HCL (CARDIAC) 20 MG/ML IV SOLN
INTRAVENOUS | Status: DC | PRN
  Administered 2016-01-10: 40 mg via INTRAVENOUS
  Administered 2016-01-10: 60 mg via INTRAVENOUS

## 2016-01-10 MED ORDER — PROMETHAZINE HCL 25 MG/ML IJ SOLN
INTRAMUSCULAR | Status: AC
  Administered 2016-01-10: 6.25 mg via INTRAVENOUS
  Filled 2016-01-10: qty 1

## 2016-01-10 MED ORDER — CEFAZOLIN SODIUM 1 G IJ SOLR
INTRAMUSCULAR | Status: DC | PRN
  Administered 2016-01-10: 2 g via INTRAMUSCULAR

## 2016-01-10 MED ORDER — MORPHINE SULFATE (PF) 2 MG/ML IV SOLN
2.0000 mg | INTRAVENOUS | Status: DC | PRN
  Administered 2016-01-10 – 2016-01-11 (×5): 2 mg via INTRAVENOUS
  Filled 2016-01-10 (×5): qty 1

## 2016-01-10 MED ORDER — OXYCODONE HCL 5 MG PO TABS: 5.0000 mg | ORAL_TABLET | Freq: Once | ORAL | Status: DC | PRN

## 2016-01-10 MED ORDER — ONDANSETRON HCL 4 MG/2ML IJ SOLN
INTRAMUSCULAR | Status: DC | PRN
  Administered 2016-01-10: 4 mg via INTRAVENOUS

## 2016-01-10 MED ORDER — MIDAZOLAM HCL 2 MG/2ML IJ SOLN
INTRAMUSCULAR | Status: AC
  Filled 2016-01-10: qty 2

## 2016-01-10 MED ORDER — LACTATED RINGERS IV SOLN
INTRAVENOUS | Status: DC | PRN
  Administered 2016-01-10 (×2): via INTRAVENOUS

## 2016-01-10 MED ORDER — OXYCODONE HCL 5 MG PO TABS
5.0000 mg | ORAL_TABLET | ORAL | Status: DC | PRN
  Administered 2016-01-10 – 2016-01-16 (×27): 10 mg via ORAL
  Filled 2016-01-10 (×26): qty 2

## 2016-01-10 MED ORDER — FENTANYL CITRATE (PF) 100 MCG/2ML IJ SOLN
INTRAMUSCULAR | Status: DC | PRN
  Administered 2016-01-10 (×2): 50 ug via INTRAVENOUS
  Administered 2016-01-10 (×2): 100 ug via INTRAVENOUS

## 2016-01-10 MED ORDER — PROMETHAZINE HCL 25 MG/ML IJ SOLN
6.2500 mg | INTRAMUSCULAR | Status: DC | PRN
  Administered 2016-01-10: 6.25 mg via INTRAVENOUS

## 2016-01-10 MED ORDER — KETAMINE HCL 10 MG/ML IJ SOLN
INTRAMUSCULAR | Status: DC | PRN
  Administered 2016-01-10: 40 mg via INTRAVENOUS

## 2016-01-10 MED ORDER — OXYCODONE HCL 5 MG/5ML PO SOLN: 5.0000 mg | Freq: Once | ORAL | Status: DC | PRN

## 2016-01-10 MED ORDER — GENTAMICIN SULFATE 40 MG/ML IJ SOLN
INTRAMUSCULAR | Status: DC | PRN
  Administered 2016-01-10: 120 mg via INTRAMUSCULAR

## 2016-01-10 MED ORDER — HYDROMORPHONE HCL 1 MG/ML IJ SOLN
INTRAMUSCULAR | Status: AC
  Administered 2016-01-10: 0.5 mg via INTRAVENOUS
  Filled 2016-01-10: qty 2

## 2016-01-10 MED ORDER — PROPOFOL 10 MG/ML IV BOLUS
INTRAVENOUS | Status: AC
  Filled 2016-01-10: qty 40

## 2016-01-10 MED ORDER — VANCOMYCIN HCL 1000 MG IV SOLR
INTRAVENOUS | Status: DC | PRN
  Administered 2016-01-10: 1000 mg

## 2016-01-10 MED ORDER — ACETAMINOPHEN 325 MG PO TABS
ORAL_TABLET | ORAL | Status: AC
  Filled 2016-01-10: qty 2

## 2016-01-10 MED ORDER — VANCOMYCIN HCL 1000 MG IV SOLR
INTRAVENOUS | Status: AC
  Filled 2016-01-10: qty 1000

## 2016-01-10 MED ORDER — PROPOFOL 10 MG/ML IV BOLUS
INTRAVENOUS | Status: DC | PRN
  Administered 2016-01-10: 50 mg via INTRAVENOUS
  Administered 2016-01-10: 200 mg via INTRAVENOUS
  Administered 2016-01-10: 50 mg via INTRAVENOUS
  Administered 2016-01-10: 100 mg via INTRAVENOUS
  Administered 2016-01-10: 50 mg via INTRAVENOUS

## 2016-01-10 MED ORDER — PROPOFOL 10 MG/ML IV BOLUS
INTRAVENOUS | Status: AC
  Filled 2016-01-10: qty 20

## 2016-01-10 MED ORDER — FENTANYL CITRATE (PF) 100 MCG/2ML IJ SOLN
INTRAMUSCULAR | Status: AC
  Filled 2016-01-10: qty 4

## 2016-01-10 MED ORDER — VANCOMYCIN HCL IN DEXTROSE 1-5 GM/200ML-% IV SOLN
1000.0000 mg | Freq: Three times a day (TID) | INTRAVENOUS | Status: DC
  Administered 2016-01-10 – 2016-01-12 (×5): 1000 mg via INTRAVENOUS
  Filled 2016-01-10 (×6): qty 200

## 2016-01-10 MED ORDER — GENTAMICIN SULFATE 40 MG/ML IJ SOLN
INTRAMUSCULAR | Status: AC
  Filled 2016-01-10: qty 4

## 2016-01-10 MED ORDER — KETAMINE HCL 100 MG/ML IJ SOLN
INTRAMUSCULAR | Status: AC
  Filled 2016-01-10: qty 1

## 2016-01-10 SURGICAL SUPPLY — 46 items
BANDAGE ACE 4X5 VEL STRL LF (GAUZE/BANDAGES/DRESSINGS) ×3 IMPLANT
BANDAGE ACE 6X5 VEL STRL LF (GAUZE/BANDAGES/DRESSINGS) ×3 IMPLANT
BLADE SURG 10 STRL SS (BLADE) ×3 IMPLANT
BNDG COHESIVE 4X5 TAN STRL (GAUZE/BANDAGES/DRESSINGS) ×3 IMPLANT
BNDG GAUZE ELAST 4 BULKY (GAUZE/BANDAGES/DRESSINGS) ×3 IMPLANT
COVER SURGICAL LIGHT HANDLE (MISCELLANEOUS) ×3 IMPLANT
CUFF TOURNIQUET SINGLE 34IN LL (TOURNIQUET CUFF) ×2 IMPLANT
DRSG ADAPTIC 3X8 NADH LF (GAUZE/BANDAGES/DRESSINGS) ×2 IMPLANT
DRSG PAD ABDOMINAL 8X10 ST (GAUZE/BANDAGES/DRESSINGS) ×4 IMPLANT
DURAPREP 26ML APPLICATOR (WOUND CARE) ×3 IMPLANT
ELECT REM PT RETURN 9FT ADLT (ELECTROSURGICAL)
ELECTRODE REM PT RTRN 9FT ADLT (ELECTROSURGICAL) IMPLANT
EVACUATOR 1/8 PVC DRAIN (DRAIN) IMPLANT
FACESHIELD WRAPAROUND (MASK) IMPLANT
FACESHIELD WRAPAROUND OR TEAM (MASK) ×3 IMPLANT
FLUID NSS /IRRIG 3000 ML XXX (IV SOLUTION) ×4 IMPLANT
GAUZE SPONGE 4X4 12PLY STRL (GAUZE/BANDAGES/DRESSINGS) ×3 IMPLANT
GAUZE XEROFORM 1X8 LF (GAUZE/BANDAGES/DRESSINGS) ×3 IMPLANT
GLOVE BIO SURGEON STRL SZ7.5 (GLOVE) ×6 IMPLANT
GLOVE BIOGEL PI IND STRL 8 (GLOVE) ×2 IMPLANT
GLOVE BIOGEL PI INDICATOR 8 (GLOVE) ×4
GOWN STRL REUS W/ TWL LRG LVL3 (GOWN DISPOSABLE) ×3 IMPLANT
GOWN STRL REUS W/TWL LRG LVL3 (GOWN DISPOSABLE) ×9
HANDPIECE INTERPULSE COAX TIP (DISPOSABLE)
KIT BASIN OR (CUSTOM PROCEDURE TRAY) ×3 IMPLANT
KIT ROOM TURNOVER OR (KITS) ×3 IMPLANT
KIT STIMULAN RAPID CURE 5CC (Orthopedic Implant) ×2 IMPLANT
MANIFOLD NEPTUNE II (INSTRUMENTS) ×3 IMPLANT
NS IRRIG 1000ML POUR BTL (IV SOLUTION) ×3 IMPLANT
PACK ORTHO EXTREMITY (CUSTOM PROCEDURE TRAY) ×3 IMPLANT
PAD ARMBOARD 7.5X6 YLW CONV (MISCELLANEOUS) ×6 IMPLANT
PENCIL BUTTON HOLSTER BLD 10FT (ELECTRODE) IMPLANT
SET HNDPC FAN SPRY TIP SCT (DISPOSABLE) IMPLANT
SPONGE LAP 18X18 X RAY DECT (DISPOSABLE) ×3 IMPLANT
STOCKINETTE IMPERVIOUS 9X36 MD (GAUZE/BANDAGES/DRESSINGS) ×3 IMPLANT
SUT ETHILON 3 0 PS 1 (SUTURE) ×6 IMPLANT
TOWEL OR 17X24 6PK STRL BLUE (TOWEL DISPOSABLE) ×3 IMPLANT
TOWEL OR 17X26 10 PK STRL BLUE (TOWEL DISPOSABLE) ×3 IMPLANT
TOWEL OR NON WOVEN STRL DISP B (DISPOSABLE) ×3 IMPLANT
TUBE ANAEROBIC SPECIMEN COL (MISCELLANEOUS) IMPLANT
TUBE CONNECTING 12'X1/4 (SUCTIONS) ×1
TUBE CONNECTING 12X1/4 (SUCTIONS) ×2 IMPLANT
TUBING CYSTO DISP (UROLOGICAL SUPPLIES) ×2 IMPLANT
UNDERPAD 30X30 (UNDERPADS AND DIAPERS) ×3 IMPLANT
WATER STERILE IRR 1000ML POUR (IV SOLUTION) ×3 IMPLANT
YANKAUER SUCT BULB TIP NO VENT (SUCTIONS) ×3 IMPLANT

## 2016-01-10 NOTE — Op Note (Signed)
01/08/2016 - 01/10/2016  8:50 AM  PATIENT:  Kevin Austin    PRE-OPERATIVE DIAGNOSIS:  Infected Right Ankle  POST-OPERATIVE DIAGNOSIS:  Same  PROCEDURE:  IRRIGATION AND DEBRIDEMENT EXTREMITY DEBRIDEMENT SYDESMOTIC, ARTHOTOMY I&D TIBIA  SURGEON:  Yoshika Vensel, Jewel BaizeIMOTHY D, MD  ASSISTANT: Aquilla HackerHenry Martensen, PA-C, he was present and scrubbed throughout the case, critical for completion in a timely fashion, and for retraction, instrumentation, and closure.   ANESTHESIA:   gen  PREOPERATIVE INDICATIONS:  Kevin Austin is a  25 y.o. male with a diagnosis of Infected Right Ankle who failed conservative measures and elected for surgical management.    The risks benefits and alternatives were discussed with the patient preoperatively including but not limited to the risks of infection, bleeding, nerve injury, cardiopulmonary complications, the need for revision surgery, among others, and the patient was willing to proceed.  OPERATIVE IMPLANTS: abx beeds mixed with 1 G vanc  OPERATIVE FINDINGS: purulent fluid, scaring of his superficial peroneal nerve.   BLOOD LOSS: min  COMPLICATIONS: none  TOURNIQUET TIME: 45min  OPERATIVE PROCEDURE:  Patient was identified in the preoperative holding area and site was marked by me He was transported to the operating theater and placed on the table in supine position taking care to pad all bony prominences. After a preincinduction time out anesthesia was induced. The right lower extremity was prepped and draped in normal sterile fashion and a pre-incision timeout was performed. He received ancef for preoperative antibiotics after cultures were obtained.   I made an anterior lateral incision extending his previous incision superiorly. Identified his superficial peroneal nerve preoperatively he had decreased sensation in this distribution. I performed a neuroplasty as it had become encased in some scar tissue. I protected the superficial peroneal nerve throughout the remainder  of the case and it was without harm.  Identified his anterior tibial tendon these were protected Tenolysis was performed.  I then identified his syndesmosis and created a window into his syndesmotic space. I performed a debridement here and I sent tissue for culture. This was an excisional debridement of skin muscle bone. I thoroughly irrigated this area.  Next I performed an arthrotomy at his ankle joint express fluid from this I took synovial biopsies and sent this for culture performed a thorough irrigation of his ankle joint.  I then made a cortical window at his distal tibia removing some bone to send for culture. I used a curette to debride his medullary space of his distal tibia  I then made a medial incision at his metadiaphyseal junction of his tibia over his periosteal reaction I performed a careful dissection down to his periosteum I performed a excisional debridement here of any purulent tissue.  Operative made a cortical window and sent bone for culture here as well I used an osteotome for this I inserted a curette into his canal from this medial side and enlarged the debridement of his medullary canal.  I then thoroughly irrigated his intramedullary space all told 6 L of saline were used for irrigation.  I then inserted antibiotic beads into his distal tibial canal as well as his anterior gutter of his ankle joint and this is syndesmotic space also both wounds. This was done after thorough irrigation I then closed his skin in layers performing a complex closure of each surgical site sterile dressings were applied he was awoken and taken the PACU in stable condition  POST OPERATIVE PLAN: Nonweightbearing one week chemical DVT prophylaxis as well as mobilization infectious disease consult  for long-term antibiotics
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
## 2016-01-10 NOTE — Transfer of Care (Signed)
Immediate Anesthesia Transfer of Care Note  Patient: Kevin Austin  Procedure(s) Performed: Procedure(s): IRRIGATION AND DEBRIDEMENT EXTREMITY DEBRIDEMENT SYDESMOTIC, ARTHOTOMY I&D TIBIA (Right)  Patient Location: PACU  Anesthesia Type:General  Level of Consciousness: awake, alert , oriented and sedated  Airway & Oxygen Therapy: Patient Spontanous Breathing and Patient connected to nasal cannula oxygen  Post-op Assessment: Report given to RN, Post -op Vital signs reviewed and stable and Patient moving all extremities  Post vital signs: Reviewed and stable  Last Vitals:  Vitals:   01/10/16 0555 01/10/16 0902  BP: 122/77   Pulse: 85 (!) 134  Resp: 16 17  Temp: 36.9 C     Last Pain:  Vitals:   01/10/16 0555  TempSrc: Oral  PainSc:       Patients Stated Pain Goal: 2 (01/09/16 2244)  Complications: No apparent anesthesia complications

## 2016-01-10 NOTE — Consult Note (Addendum)
[facility] for Infectious Disease  Date of Admission:  01/08/2016  Date of Consult:  01/10/2016  Reason for Consult: IVDA, MRSA bacteremia Referring Physician: Chahn-Yang  Impression/Recommendation IVDA MRSA Bacteremia 09-2015  Osteomyelitis of TIbia  Will  recheck HIV Check Hep C VL and genotype Start vanco Stop doxy Needs MRSA contact isolation Unfortunately, will need long term IV antibiotics again  Thank you so much for this interesting consult,   Bobby Rumpf (pager) [phone] [email or web address]  Kevin Austin is an 25 y.o. male.  HPI: 25 yo M adm 6-27 with hx of IVDA, MRSA bacteremia, septic emboli to his R kidneys, lungs, and abscesses in both hands. He was taken to OR for debridement of his hands. He underwent debridement of his R ankle (7-6).  His TEE was negative. H also had sternal osteomyelitis. By 7-6 he underwent debridement of sternal abscess in OR. He also underwent I & D of R hip for abscess (7-11).  He completed IV vancomycin on 8-22. He was d/c on 8-22 to home (Grandmother's) on po doxy.   He returns on 9-12 with worsening R foot pain and swelling. He had CT which showed osteomyelitis. He was continued on doxy.  He was evaluated by ortho and had I & D of R ankle today.  His sternal wound has been felt to be healing well.   He has been afebrile. His WBC is normal.   Past Medical History:  Diagnosis Date  . Abscess of sternal region   . Acute osteomyelitis of pelvic region ([place])   . MRSA bacteremia 09/2014  . Osteomyelitis of right tibia ([place])   . Sacral osteomyelitis ([place])   . Septic arthritis of ankle or foot, right   . Septic pulmonary embolism [facility])     Past Surgical History:  Procedure Laterality Date  . I&D EXTREMITY Bilateral 10/23/2015   Procedure: IRRIGATION AND DEBRIDEMENT EXTREMITY/HAND AND ARM;  Surgeon: Leanora Cover, MD;  Location: [place];  Service: Orthopedics;  Laterality: Bilateral;  . I&D EXTREMITY Right 10/27/2015   Procedure: IRRIGATION AND DEBRIDEMENT EXTREMITY;  Surgeon: Mcarthur Rossetti, MD;  Location: [place];  Service: Orthopedics;  Laterality: Right;  . I&D EXTREMITY Right 11/01/2015   Procedure: IRRIGATION AND DEBRIDEMENT OF ANKLE AND SOFT TISSUE;  Surgeon: Renette Butters, MD;  Location: [place];  Service: Orthopedics;  Laterality: Right;  . I&D EXTREMITY N/A 11/01/2015   Procedure: IRRIGATION AND DEBRIDEMENT OF STERNUM W/ POSSIBLE WOUND VAC PLACEMENT;  Surgeon: Melrose Nakayama, MD;  Location: [place];  Service: Vascular;  Laterality: N/A;  . INCISION AND DRAINAGE HIP Right 11/06/2015   Procedure: IRRIGATION AND DEBRIDEMENT HIP;  Surgeon: Renette Butters, MD;  Location: [place];  Service: Orthopedics;  Laterality: Right;  . IRRIGATION AND DEBRIDEMENT FOOT Left 10/23/2015   Procedure: IRRIGATION AND DEBRIDEMENT FOOT;  Surgeon: Leanora Cover, MD;  Location: [place];  Service: Orthopedics;  Laterality: Left;     Allergies  Allergen Reactions  . Ketorolac Nausea Only    Per [facility] records  . Tramadol Nausea Only    Per [place] records    Medications:  Scheduled: . acetaminophen      . doxycycline  100 mg Oral BID  . enoxaparin (LOVENOX) injection  40 mg Subcutaneous Q24H  . feeding supplement (ENSURE ENLIVE)  237 mL Oral BID BM  . mupirocin cream   Topical Daily  . oxyCODONE        Abtx:  Anti-infectives    Start  Dose/Rate Route Frequency Ordered Stop   01/10/16 0819  vancomycin (VANCOCIN) powder  Status:  Discontinued       As needed 01/10/16 0819 01/10/16 0852   01/10/16 0819  gentamicin (GARAMYCIN) injection  Status:  Discontinued       As needed 01/10/16 0820 01/10/16 0852   01/08/16 1000  doxycycline (VIBRA-TABS) tablet 100 mg    Comments:  X 4 weeks. Further continuation of the antibiotic will be decided by ID clinic     100 mg Oral 2 times daily 01/08/16 0427        Total days of antibiotics: doxy (8-22)          Social History:  reports that he has been smoking  Cigarettes.  He has been smoking about 1.50 packs per day. He has never used smokeless tobacco. He reports that he uses drugs, including Methamphetamines and Heroin. He reports that he does not drink alcohol.  History reviewed. No pertinent family history.  General ROS: no BM since adm, normal urine, no f/c, chest wound healing well. adm UDS -. see HPI.   Blood pressure (!) 144/92, pulse 97, temperature 97.9 F (36.6 C), resp. rate 18, height 6' (1.829 m), weight 81 kg (178 lb 9.6 oz), SpO2 100 %. General appearance: alert, cooperative and no distress Eyes: negative findings: pupils equal, round, reactive to light and accomodation Throat: normal findings: oropharynx pink & moist without lesions or evidence of thrush Neck: no adenopathy and supple, symmetrical, trachea midline Lungs: clear to auscultation bilaterally Chest wall: midline sternal wound dressed.  Heart: regular rate and rhythm Abdomen: normal findings: bowel sounds normal and soft, non-tender Extremities: RLE dressed.  Neurologic: Sensory: normal, BLE   Results for orders placed or performed during the hospital encounter of 01/08/16 (from the past 48 hour(s))  CBC with Differential/Platelet     Status: Abnormal   Collection Time: 01/10/16  9:33 AM  Result Value Ref Range   WBC 6.5 4.0 - 10.5 K/uL   RBC 4.70 4.22 - 5.81 MIL/uL   Hemoglobin 11.7 (L) 13.0 - 17.0 g/dL   HCT 37.0 (L) 39.0 - 52.0 %   MCV 78.7 78.0 - 100.0 fL   MCH 24.9 (L) 26.0 - 34.0 pg   MCHC 31.6 30.0 - 36.0 g/dL   RDW 15.4 11.5 - 15.5 %   Platelets 200 150 - 400 K/uL   Neutrophils Relative % 56 %   Neutro Abs 3.7 1.7 - 7.7 K/uL   Lymphocytes Relative 33 %   Lymphs Abs 2.1 0.7 - 4.0 K/uL   Monocytes Relative 9 %   Monocytes Absolute 0.6 0.1 - 1.0 K/uL   Eosinophils Relative 2 %   Eosinophils Absolute 0.1 0.0 - 0.7 K/uL   Basophils Relative 0 %   Basophils Absolute 0.0 0.0 - 0.1 K/uL  Comprehensive metabolic panel     Status: Abnormal   Collection  Time: 01/10/16  9:33 AM  Result Value Ref Range   Sodium 139 135 - 145 mmol/L   Potassium 4.0 3.5 - 5.1 mmol/L   Chloride 105 101 - 111 mmol/L   CO2 26 22 - 32 mmol/L   Glucose, Bld 102 (H) 65 - 99 mg/dL   BUN 14 6 - 20 mg/dL   Creatinine, Ser 0.67 0.61 - 1.24 mg/dL   Calcium 9.4 8.9 - 10.3 mg/dL   Total Protein 7.5 6.5 - 8.1 g/dL   Albumin 3.8 3.5 - 5.0 g/dL   AST 103 (H) 15 - 41  U/L   ALT 223 (H) 17 - 63 U/L   Alkaline Phosphatase 167 (H) 38 - 126 U/L   Total Bilirubin 0.6 0.3 - 1.2 mg/dL   GFR calc non Af Amer >60 >60 mL/min   GFR calc Af Amer >60 >60 mL/min    Comment: (NOTE) The eGFR has been calculated using the CKD EPI equation. This calculation has not been validated in all clinical situations. eGFR's persistently <60 mL/min signify possible Chronic Kidney Disease.    Anion gap 8 5 - 15      Component Value Date/Time   SDES BLOOD LEFT ANTECUBITAL 01/08/2016 0524   SPECREQUEST BOTTLES DRAWN AEROBIC AND ANAEROBIC 5CC 01/08/2016 0524   CULT NO GROWTH 1 DAY 01/08/2016 0524   REPTSTATUS PENDING 01/08/2016 0524   No results found. Recent Results (from the past 240 hour(s))  Culture, blood (routine x 2)     Status: None (Preliminary result)   Collection Time: 01/08/16  5:21 AM  Result Value Ref Range Status   Specimen Description BLOOD LEFT ANTECUBITAL  Final   Special Requests BOTTLES DRAWN AEROBIC AND ANAEROBIC 5CC  Final   Culture NO GROWTH 1 DAY  Final   Report Status PENDING  Incomplete  Culture, blood (routine x 2)     Status: None (Preliminary result)   Collection Time: 01/08/16  5:24 AM  Result Value Ref Range Status   Specimen Description BLOOD LEFT ANTECUBITAL  Final   Special Requests BOTTLES DRAWN AEROBIC AND ANAEROBIC 5CC  Final   Culture NO GROWTH 1 DAY  Final   Report Status PENDING  Incomplete  MRSA PCR Screening     Status: None   Collection Time: 01/08/16  2:21 PM  Result Value Ref Range Status   MRSA by PCR NEGATIVE NEGATIVE Final    Comment:         The GeneXpert MRSA Assay (FDA approved for NASAL specimens only), is one component of a comprehensive MRSA colonization surveillance program. It is not intended to diagnose MRSA infection nor to guide or monitor treatment for MRSA infections.       01/10/2016, 2:21 PM     LOS: 2 days    Records and images were personally reviewed where available.

## 2016-01-10 NOTE — Progress Notes (Signed)
Per pt report, contact precautions removed yesterday 01/09/16 because "infectious disease called the nurse and said I don't need it anymore." Infectious Disease on-call paged for further clarifications, no answer yet.  Will continue to monitor.

## 2016-01-10 NOTE — Addendum Note (Signed)
Addendum  created 01/10/16 1508 by Fransisca KaufmannMary E Elysa Womac, CRNA   Anesthesia Event edited

## 2016-01-10 NOTE — Progress Notes (Signed)
Patient returned from OR at this time. Alert and in stable condition. 
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
## 2016-01-10 NOTE — Anesthesia Procedure Notes (Signed)
Performed by: Armoni Kludt E     

## 2016-01-10 NOTE — Anesthesia Procedure Notes (Signed)
Procedure Name: LMA Insertion Date/Time: 01/10/2016 7:44 AM Performed by: Fransisca KaufmannMEYER, Jozlyn Schatz E Pre-anesthesia Checklist: Patient identified, Emergency Drugs available, Suction available and Patient being monitored Patient Re-evaluated:Patient Re-evaluated prior to inductionOxygen Delivery Method: Circle System Utilized Preoxygenation: Pre-oxygenation with 100% oxygen Intubation Type: IV induction Ventilation: Mask ventilation without difficulty LMA: LMA inserted LMA Size: 5.0 Number of attempts: 1 Airway Equipment and Method: Bite block Placement Confirmation: positive ETCO2 Tube secured with: Tape Dental Injury: Teeth and Oropharynx as per pre-operative assessment

## 2016-01-10 NOTE — Anesthesia Postprocedure Evaluation (Signed)
Anesthesia Post Note  Patient: Kevin Austin  Procedure(s) Performed: Procedure(s) (LRB): IRRIGATION AND DEBRIDEMENT EXTREMITY DEBRIDEMENT SYDESMOTIC, ARTHOTOMY I&D TIBIA (Right)  Patient location during evaluation: PACU Anesthesia Type: General Level of consciousness: awake and alert Pain management: pain level controlled Vital Signs Assessment: post-procedure vital signs reviewed and stable Respiratory status: spontaneous breathing, nonlabored ventilation, respiratory function stable and patient connected to nasal cannula oxygen Cardiovascular status: blood pressure returned to baseline and stable Postop Assessment: no signs of nausea or vomiting Anesthetic complications: no    Last Vitals:  Vitals:   01/10/16 0945 01/10/16 1007  BP:  (!) 144/92  Pulse: (!) 101 97  Resp:  18  Temp:  36.6 C    Last Pain:  Vitals:   01/10/16 1310  TempSrc:   PainSc: 3                  Kennieth RadFitzgerald, Dennys Guin E

## 2016-01-10 NOTE — Progress Notes (Signed)
Pharmacy Antibiotic Note  Kevin Austin is a 25 y.o. male admitted on 01/08/2016 with sepsis/osteomyelitis.  Pharmacy has been consulted for Vancomycin dosing.  IVDA with MRSA sepsis, well known to pharmacy from previous dosing Vancomycin levels previously therapeutic on Vancomycin 1 gram iv Q 8 hours  Plan: Vancomycin 1 gram iv Q 8 hours Long term course of antibiotics planned Check Vancomycin trough when appropriate  Height: 6' (182.9 cm) Weight: 178 lb 9.6 oz (81 kg) IBW/kg (Calculated) : 77.6  Temp (24hrs), Avg:98.2 F (36.8 C), Min:97.6 F (36.4 C), Max:98.9 F (37.2 C)   Recent Labs Lab 01/08/16 0533 01/10/16 0933  WBC 5.3 6.5  CREATININE 0.63 0.67    Estimated Creatinine Clearance: 157.6 mL/min (by C-G formula based on SCr of 0.67 mg/dL).    Allergies  Allergen Reactions  . [facility]a [facility]    [facility]  . Tramadol Nausea Only    Per [facility]     Thank you for allowing pharmacy to be a part of this patient's care. Okey RegalLisa Leandrea Ackley, PharmD [phone] 01/10/2016 3:09 PM

## 2016-01-10 NOTE — Interval H&P Note (Signed)
History and Physical Interval Note:  01/10/2016 7:20 AM  Kevin Austin  has presented today for surgery, with the diagnosis of Infected Right Ankle  The various methods of treatment have been discussed with the patient and family. After consideration of risks, benefits and other options for treatment, the patient has consented to  Procedure(s): IRRIGATION AND DEBRIDEMENT EXTREMITY (Right) as a surgical intervention .  The patient's history has been reviewed, patient examined, no change in status, stable for surgery.  I have reviewed the patient's chart and labs.  Questions were answered to the patient's satisfaction.     On exam he has decreased sensation in a SP and DP nerve distribution to the R foot.    [name] D

## 2016-01-11 ENCOUNTER — Encounter (HOSPITAL_COMMUNITY): Payer: Self-pay | Admitting: Orthopedic Surgery

## 2016-01-11 DIAGNOSIS — B182 Chronic viral hepatitis C: Secondary | ICD-10-CM

## 2016-01-11 LAB — HIV ANTIBODY (ROUTINE TESTING W REFLEX): HIV SCREEN 4TH GENERATION: NONREACTIVE

## 2016-01-11 LAB — COMPREHENSIVE METABOLIC PANEL
ALT: 188 U/L — ABNORMAL HIGH (ref 17–63)
AST: 77 U/L — AB (ref 15–41)
Albumin: 3.6 g/dL (ref 3.5–5.0)
Alkaline Phosphatase: 155 U/L — ABNORMAL HIGH (ref 38–126)
Anion gap: 9 (ref 5–15)
BUN: 9 mg/dL (ref 6–20)
CALCIUM: 9.6 mg/dL (ref 8.9–10.3)
CHLORIDE: 99 mmol/L — AB (ref 101–111)
CO2: 29 mmol/L (ref 22–32)
Creatinine, Ser: 0.6 mg/dL — ABNORMAL LOW (ref 0.61–1.24)
GFR calc Af Amer: >60 mL/min (ref >60)
GFR calc non Af Amer: >60 mL/min (ref >60)
Glucose, Bld: 96 mg/dL (ref 65–99)
POTASSIUM: 3.8 mmol/L (ref 3.5–5.1)
SODIUM: 137 mmol/L (ref 135–145)
TOTAL PROTEIN: 7.8 g/dL (ref 6.5–8.1)
Total Bilirubin: 0.8 mg/dL (ref 0.3–1.2)

## 2016-01-11 LAB — CBC WITH DIFFERENTIAL/PLATELET
BASOS ABS: 0 10*3/uL (ref 0.0–0.1)
BASOS PCT: 0 %
EOS PCT: 3 %
Eosinophils Absolute: 0.2 10*3/uL (ref 0.0–0.7)
HCT: 36.7 % — ABNORMAL LOW (ref 39.0–52.0)
Hemoglobin: 11.8 g/dL — ABNORMAL LOW (ref 13.0–17.0)
Lymphocytes Relative: 34 %
Lymphs Abs: 2.5 10*3/uL (ref 0.7–4.0)
MCH: 25.1 pg — ABNORMAL LOW (ref 26.0–34.0)
MCHC: 32.2 g/dL (ref 30.0–36.0)
MCV: 78.1 fL (ref 78.0–100.0)
MONO ABS: 0.7 10*3/uL (ref 0.1–1.0)
Monocytes Relative: 9 %
Neutro Abs: 4 10*3/uL (ref 1.7–7.7)
Neutrophils Relative %: 54 %
PLATELETS: 216 10*3/uL (ref 150–400)
RBC: 4.7 MIL/uL (ref 4.22–5.81)
RDW: 15.5 % (ref 11.5–15.5)
WBC: 7.3 10*3/uL (ref 4.0–10.5)

## 2016-01-11 MED ORDER — DEXTROSE 5 % IV SOLN
2.0000 g | INTRAVENOUS | Status: DC
  Administered 2016-01-11 – 2016-01-15 (×5): 2 g via INTRAVENOUS
  Filled 2016-01-11 (×6): qty 2

## 2016-01-11 MED ORDER — MORPHINE SULFATE (PF) 2 MG/ML IV SOLN
2.0000 mg | INTRAVENOUS | Status: DC | PRN
  Administered 2016-01-11 – 2016-01-12 (×3): 2 mg via INTRAVENOUS
  Filled 2016-01-11 (×3): qty 1

## 2016-01-11 NOTE — Care Management Note (Signed)
Case Management Note  Patient Details  Name: Kevin Austin MRN: [id number] Date of Birth: Aug 21, 1990  Subjective/Objective:                    Action/Plan: Prior to admission patient lived with grandmother and was on PO antibiotics .  [facility] had [facility] who were teaching family trach care. If patient needs PICC and IV ABX they are unable to take take him back due to IVDU. Spoke with Rica KoyanagiZach SW Assistant Director who knows patient . CM instructed to enter SW consult order . Same done.  ID note : he will need long term IV ABX . Hisory of IVDU. Patient recently here . Will have SW re assess.   Expected Discharge Date:                  Expected Discharge Plan:     In-House Referral:  Clinical Social Work  Discharge planning Services     Post Acute Care Choice:    Choice offered to:     DME Arranged:    DME Agency:     HH Arranged:    HH Agency:     Status of Service:  In process, will continue to follow  If discussed at Long Length of Stay Meetings, dates discussed:    Additional Comments:  Kingsley PlanWile, Micaella Gitto Marie, RN 01/11/2016, 10:54 AM

## 2016-01-11 NOTE — Progress Notes (Signed)
INFECTIOUS DISEASE PROGRESS NOTE  ID: Kevin Austin is a 25 y.o. male with  Principal Problem:   Osteomyelitis of right tibia (HCC) Active Problems:   IV drug abuse   MRSA (methicillin resistant staph aureus) culture positive   Right ankle pain   Microcytic anemia   Transaminitis   Hepatitis C   Anxiety  Subjective: Without complaints,  No problems with anbx  Abtx:  Anti-infectives    Start     Dose/Rate Route Frequency Ordered Stop   01/10/16 1600  vancomycin (VANCOCIN) IVPB 1000 mg/200 mL premix     1,000 mg 200 mL/hr over 60 Minutes Intravenous Every 8 hours 01/10/16 1509     01/10/16 0819  vancomycin (VANCOCIN) powder  Status:  Discontinued       As needed 01/10/16 0819 01/10/16 0852   01/10/16 0819  gentamicin (GARAMYCIN) injection  Status:  Discontinued       As needed 01/10/16 0820 01/10/16 0852   01/08/16 1000  doxycycline (VIBRA-TABS) tablet 100 mg  Status:  Discontinued    Comments:  X 4 weeks. Further continuation of the antibiotic will be decided by ID clinic     100 mg Oral 2 times daily 01/08/16 0427 01/10/16 1456      Medications:  Scheduled: . enoxaparin (LOVENOX) injection  40 mg Subcutaneous Q24H  . feeding supplement (ENSURE ENLIVE)  237 mL Oral BID BM  . mupirocin cream   Topical Daily  . vancomycin  1,000 mg Intravenous Q8H    Objective: Vital signs in last 24 hours: Temp:  [97.8 F (36.6 C)-98.5 F (36.9 C)] 98.2 F (36.8 C) (09/15 16100632) Pulse Rate:  [89-113] 89 (09/15 0632) Resp:  [18-20] 18 (09/15 [id number]) BP: (120-131)/(61-81) 131/81 (09/15 54090632) SpO2:  [97 %-98 %] 97 % (09/15 81190632)   General appearance: alert, cooperative and no distress Chest wall: wound dressed.  Extremities: RLE dressed.   Lab Results  Recent Labs  01/10/16 0933 01/11/16 0441  WBC 6.5 7.3  HGB 11.7* 11.8*  HCT 37.0* 36.7*  NA 139 137  K 4.0 3.8  CL 105 99*  CO2 26 29  BUN 14 9  CREATININE 0.67 0.60*   Liver Panel  Recent Labs  01/10/16 0933  01/11/16 0441  PROT 7.5 7.8  ALBUMIN 3.8 3.6  AST 103* 77*  ALT 223* 188*  ALKPHOS 167* 155*  BILITOT 0.6 0.8   Sedimentation Rate No results for input(s): ESRSEDRATE in the last 72 hours. C-Reactive Protein No results for input(s): CRP in the last 72 hours.  Microbiology: Recent Results (from the past 240 hour(s))  Culture, blood (routine x 2)     Status: None (Preliminary result)   Collection Time: 01/08/16  5:21 AM  Result Value Ref Range Status   Specimen Description BLOOD LEFT ANTECUBITAL  Final   Special Requests BOTTLES DRAWN AEROBIC AND ANAEROBIC 5CC  Final   Culture NO GROWTH 3 DAYS  Final   Report Status PENDING  Incomplete  Culture, blood (routine x 2)     Status: None (Preliminary result)   Collection Time: 01/08/16  5:24 AM  Result Value Ref Range Status   Specimen Description BLOOD LEFT ANTECUBITAL  Final   Special Requests BOTTLES DRAWN AEROBIC AND ANAEROBIC 5CC  Final   Culture NO GROWTH 3 DAYS  Final   Report Status PENDING  Incomplete  MRSA PCR Screening     Status: None   Collection Time: 01/08/16  2:21 PM  Result Value Ref Range Status  MRSA by PCR NEGATIVE NEGATIVE Final    Comment:        The GeneXpert MRSA Assay (FDA approved for NASAL specimens only), is one component of a comprehensive MRSA colonization surveillance program. It is not intended to diagnose MRSA infection nor to guide or monitor treatment for MRSA infections.   Aerobic/Anaerobic Culture (surgical/deep wound)     Status: None (Preliminary result)   Collection Time: 01/10/16  8:24 AM  Result Value Ref Range Status   Specimen Description TISSUE RIGHT ANKLE  Final   Special Requests NONE  Final   Gram Stain   Final    RARE WBC PRESENT, PREDOMINANTLY MONONUCLEAR RARE GRAM NEGATIVE RODS    Culture NO GROWTH 1 DAY  Final   Report Status PENDING  Incomplete    Studies/Results: No results found.   Assessment/Plan: IVDA MRSA Bacteremia 09-2015 Osteomyelitis of  TIbia  Total days of antibiotics: 1 vanco       His gram stain shows rare GNR Will add ceftriaxone.  Await Cx results.  Hep C VL and genotype pending.     [name] Infectious Diseases (pager) [phone] [email or web address] 01/11/2016, 5:27 PM  LOS: 3 days

## 2016-01-11 NOTE — Progress Notes (Signed)
PROGRESS NOTE    Kevin Austin  YNW:[id number] DOB: 1991/02/01 DOA: 01/08/2016 PCP: No primary care provider on file.   Outpatient Specialists: Cardiothoracic surgery: Dr. Dorris Fetch Orthopedic surgery: Dr. Renaye Rakers Hand surgery: Dr. Merlyn Lot ID: [id number], seen by Dr. Luciana Axe inpatient   Brief Narrative:  Kevin Austin is a 25 yo male with history of IVDU and recent MRSA bacteremia and complications who presented to [facility] ED 9/11 with worsening right foot pain and swelling. He had been at home for 3 weeks following prolonged hospitalization which included multiple debridements of abscesses for septic emboli, including the right ankle by Dr. Eulah Pont on 7/6. He had been taking doxycycline as directed but unfortunately had to reschedule several follow up appointments and so he hasn't followed up. He noted worsening pain when weight bearing radiating up the leg with swelling of the right ankle and intermittent fevers. Doppler U/S ruled out DVT. CT of the right ankle demonstrated periosteal reaction of the distal tibia consistent with osteomyelitis. He was transferred to [facility].   Assessment & Plan:   Principal Problem:   Osteomyelitis of right tibia [facility] Active Problems:   IV drug abuse   MRSA (methicillin resistant staph aureus) culture positive   Right ankle pain   Microcytic anemia   Transaminitis   Hepatitis C   Anxiety   Acute right tibia osteomyelitis - Blood cultures NGTD - follow  - S/p I&D 9/14 with Dr. Wandra Feinstein  - Non weight bearing for 1 week  - Continue vanco - Appreciate ortho, ID    Sternal wound s/p debridement - WOC consulted: Bactroban to promote moist healing, foam dressing to protect from further injury.  Transaminitis:Hep C Ab+, other panel neg, HIV neg.  - Trend CMP - Follow up with GI outpatient for hep C work up and treatment  - HIV Ab, HCV RNA pending   Acute microcytic anemia on chronic normocytic anemia:Presumably acute blood loss. Trending slowly upward since  postoperative blood draws - Stable  Anxiety:  - Continue lorazepam prn  History of IVDU: Pt discharged with benzodiazepines as outpatient and UDS only positive for THC.   DVT prophylaxis: Lovenox Code Status: Full Family Communication: No family in room during exam  Disposition Plan: Continue antibiotic treatment. Await further recommendations for abx treatment and right ankle follow up prior to discharge   Consultants:   ID, Dr. Gilmore Laroche, Dr. Renaye Rakers  Procedures:   I&D Dr. Eulah Pont 9/14   Antimicrobials:  Doxycycline 9/12 - 9/14  Vanco 9/14 >>   Subjective: Patient seen and examined laying in bed comfortably. He states that he still continues to have significant pain in his right ankle. He has been afebrile. He denies any chest pains, shortness of breath, nausea, vomiting, diarrhea, abdominal pain, dysuria.   Objective: Vitals:   01/10/16 1754 01/10/16 2244 01/11/16 0133 01/11/16 0632  BP: 124/61 128/71 120/76 131/81  Pulse: (!) 113 100 (!) 101 89  Resp: 20 18 18 18   Temp: 98.4 F (36.9 C) 98.5 F (36.9 C) 97.8 F (36.6 C) 98.2 F (36.8 C)  TempSrc: Oral Oral Oral Oral  SpO2: 98% 98% 98% 97%  Weight:      Height:        Intake/Output Summary (Last 24 hours) at 01/11/16 1039 Last data filed at 01/11/16 0630  Gross per 24 hour  Intake              640 ml  Output  2825 ml  Net            -2185 ml   Filed Weights   01/08/16 0301  Weight: 81 kg (178 lb 9.6 oz)    Examination:  General exam: Appears calm and comfortable  Respiratory system: Clear to auscultation. Respiratory effort normal. Cardiovascular system: S1 & S2 heard, tachycardic, regular. No JVD, murmurs, rubs, gallops or clicks. Gastrointestinal system: Abdomen is nondistended, soft and nontender. No organomegaly or masses felt. Normal bowel sounds heard. Central nervous system: Alert and oriented. No focal neurological deficits. Lethargic Extremities: Symmetric 5 x 5  power. Right ankle dressed post operatively Skin: No rashes, lesions or ulcers, multiple healed scars on right ankle, left dorsal foot, right hand, +sternal wound dressed  Psychiatry: Judgement and insight appear normal. Mood & affect appropriate.   Data Reviewed: I have personally reviewed following labs and imaging studies  CBC:  Recent Labs Lab 01/08/16 0533 01/10/16 0933 01/11/16 0441  WBC 5.3 6.5 7.3  NEUTROABS 2.4 3.7 4.0  HGB 11.4* 11.7* 11.8*  HCT 36.2* 37.0* 36.7*  MCV 79.0 78.7 78.1  PLT 184 200 216   Basic Metabolic Panel:  Recent Labs Lab 01/08/16 0533 01/10/16 0933 01/11/16 0441  NA 137 139 137  K 3.7 4.0 3.8  CL 103 105 99*  CO2 26 26 29   GLUCOSE 122* 102* 96  BUN 13 14 9   CREATININE 0.63 0.67 0.60*  CALCIUM 9.5 9.4 9.6   GFR: Estimated Creatinine Clearance: 157.6 mL/min (by C-G formula based on SCr of 0.6 mg/dL (L)). Liver Function Tests:  Recent Labs Lab 01/08/16 0533 01/10/16 0933 01/11/16 0441  AST 98* 103* 77*  ALT 221* 223* 188*  ALKPHOS 163* 167* 155*  BILITOT 0.8 0.6 0.8  PROT 7.2 7.5 7.8  ALBUMIN 3.5 3.8 3.6   No results for input(s): LIPASE, AMYLASE in the last 168 hours. No results for input(s): AMMONIA in the last 168 hours. Coagulation Profile: No results for input(s): INR, PROTIME in the last 168 hours. Cardiac Enzymes: No results for input(s): CKTOTAL, CKMB, CKMBINDEX, TROPONINI in the last 168 hours. BNP (last 3 results) No results for input(s): PROBNP in the last 8760 hours. HbA1C: No results for input(s): HGBA1C in the last 72 hours. CBG: No results for input(s): GLUCAP in the last 168 hours. Lipid Profile: No results for input(s): CHOL, HDL, LDLCALC, TRIG, CHOLHDL, LDLDIRECT in the last 72 hours. Thyroid Function Tests: No results for input(s): TSH, T4TOTAL, FREET4, T3FREE, THYROIDAB in the last 72 hours. Anemia Panel: No results for input(s): VITAMINB12, FOLATE, FERRITIN, TIBC, IRON, RETICCTPCT in the last 72  hours. Sepsis Labs: No results for input(s): PROCALCITON, LATICACIDVEN in the last 168 hours.  Recent Results (from the past 240 hour(s))  Culture, blood (routine x 2)     Status: None (Preliminary result)   Collection Time: 01/08/16  5:21 AM  Result Value Ref Range Status   Specimen Description BLOOD LEFT ANTECUBITAL  Final   Special Requests BOTTLES DRAWN AEROBIC AND ANAEROBIC 5CC  Final   Culture NO GROWTH 2 DAYS  Final   Report Status PENDING  Incomplete  Culture, blood (routine x 2)     Status: None (Preliminary result)   Collection Time: 01/08/16  5:24 AM  Result Value Ref Range Status   Specimen Description BLOOD LEFT ANTECUBITAL  Final   Special Requests BOTTLES DRAWN AEROBIC AND ANAEROBIC 5CC  Final   Culture NO GROWTH 2 DAYS  Final   Report Status PENDING  Incomplete  MRSA PCR Screening     Status: None   Collection Time: 01/08/16  2:21 PM  Result Value Ref Range Status   MRSA by PCR NEGATIVE NEGATIVE Final    Comment:        The GeneXpert MRSA Assay (FDA approved for NASAL specimens only), is one component of a comprehensive MRSA colonization surveillance program. It is not intended to diagnose MRSA infection nor to guide or monitor treatment for MRSA infections.   Aerobic/Anaerobic Culture (surgical/deep wound)     Status: None (Preliminary result)   Collection Time: 01/10/16  8:24 AM  Result Value Ref Range Status   Specimen Description TISSUE RIGHT ANKLE  Final   Special Requests NONE  Final   Gram Stain   Final    RARE WBC PRESENT, PREDOMINANTLY MONONUCLEAR RARE GRAM NEGATIVE RODS    Culture PENDING  Incomplete   Report Status PENDING  Incomplete         Radiology Studies: No results found.      Scheduled Meds: . enoxaparin (LOVENOX) injection  40 mg Subcutaneous Q24H  . feeding supplement (ENSURE ENLIVE)  237 mL Oral BID BM  . mupirocin cream   Topical Daily  . vancomycin  1,000 mg Intravenous Q8H   Continuous Infusions:    LOS: 3 days     Time spent: 30 minutes     Noralee StainJennifer Marializ Ferrebee, DO [facility] Pager [phone]  If 7PM-7AM, please contact night-coverage [email or web address] Password [email or web address] 01/11/2016, 10:39 AM

## 2016-01-11 NOTE — Progress Notes (Signed)
   Assessment: 1 Day Post-Op  S/P Procedure(s) (LRB): IRRIGATION AND DEBRIDEMENT EXTREMITY DEBRIDEMENT SYDESMOTIC, ARTHOTOMY I&D TIBIA (Right) by Dr. Jewel Baizeimothy D. Eulah PontMurphy on 01/10/16  Principal Problem:   Osteomyelitis of right tibia [facility](HCC) Active Problems:   IV drug abuse   MRSA (methicillin resistant staph aureus) culture positive   Right ankle pain   Microcytic anemia   Transaminitis   Hepatitis C   Anxiety  Plan: Weight Bearing: Non Weight Bearing (NWB) for 1 week Right Lower Extremity.  Plan for WBAT thereafter. Dressings: Reinforce w/ ABD / Ace prn.  Sutures will need to come out in 10 days to 2 weeks (by 01/25/16) VTE prophylaxis: per primary, SCDs Dispo: per primary.   Will need to follow up with Dr. Wandra Feinstein. Murphy in 2 weeks  Continue ABX therapy per ID  Subjective: Patient reports pain as moderate. Pain requiring IV and PO meds.  Tolerating diet.  Urinating.   Objective:   VITALS:   Vitals:   01/10/16 1754 01/10/16 2244 01/11/16 0133 01/11/16 0632  BP: 124/61 128/71 120/76 131/81  Pulse: (!) 113 100 (!) 101 89  Resp: 20 18 18 18   Temp: 98.4 F (36.9 C) 98.5 F (36.9 C) 97.8 F (36.6 C) 98.2 F (36.8 C)  TempSrc: Oral Oral Oral Oral  SpO2: 98% 98% 98% 97%  Weight:      Height:       CBC Latest Ref Rng & Units 01/11/2016 01/10/2016 01/08/2016  WBC 4.0 - 10.5 K/uL 7.3 6.5 5.3  Hemoglobin 13.0 - 17.0 g/dL 11.8(L) 11.7(L) 11.4(L)  Hematocrit 39.0 - 52.0 % 36.7(L) 37.0(L) 36.2(L)  Platelets 150 - 400 K/uL 216 200 184   Intake/Output      09/14 0701 - 09/15 0700 09/15 0701 - 09/16 0700   P.O. 240    I.V. (mL/kg) 1000 (12.3)    IV Piggyback 400    Total Intake(mL/kg) 1640 (20.2)    Urine (mL/kg/hr) 2825 (1.5)    Blood 30 (0)    Total Output 2855     Net -1215           General: NAD.  Upright in bed.  Calm, conversant. MSK Foot warm. Sensation intact distally Small ROM Dorsiflexion/Plantar flexion, ehl, fhl - limited by pain / swelling. Incision: dressing  C/D/I - scant drainage  Albina BilletHenry Calvin Martensen III 01/11/2016, 7:58 AM

## 2016-01-11 NOTE — Progress Notes (Signed)
Orthopedic Tech Progress Note Patient Details:  Jolene Schimkerey Beman 04/15/1991 [id number]  Patient ID: Jolene Schimkerey Diniz, male   DOB: 04/15/1991, 25 y.o.   MRN: [id number]   Saul FordyceJennifer C Laban Orourke 01/11/2016, 8:47 AMCalled [employer] for right Prafo boot.

## 2016-01-12 LAB — COMPREHENSIVE METABOLIC PANEL
ALBUMIN: 3.7 g/dL (ref 3.5–5.0)
ALK PHOS: 151 U/L — AB (ref 38–126)
ALT: 168 U/L — AB (ref 17–63)
AST: 64 U/L — ABNORMAL HIGH (ref 15–41)
Anion gap: 8 (ref 5–15)
BUN: 12 mg/dL (ref 6–20)
CALCIUM: 9.7 mg/dL (ref 8.9–10.3)
CO2: 30 mmol/L (ref 22–32)
CREATININE: 0.69 mg/dL (ref 0.61–1.24)
Chloride: 99 mmol/L — ABNORMAL LOW (ref 101–111)
GFR calc Af Amer: >60 mL/min (ref >60)
GFR calc non Af Amer: >60 mL/min (ref >60)
GLUCOSE: 119 mg/dL — AB (ref 65–99)
Potassium: 4.1 mmol/L (ref 3.5–5.1)
SODIUM: 137 mmol/L (ref 135–145)
Total Bilirubin: 0.7 mg/dL (ref 0.3–1.2)
Total Protein: 7.6 g/dL (ref 6.5–8.1)

## 2016-01-12 LAB — CBC WITH DIFFERENTIAL/PLATELET
Basophils Absolute: 0 10*3/uL (ref 0.0–0.1)
Basophils Relative: 0 %
EOS ABS: 0.4 10*3/uL (ref 0.0–0.7)
Eosinophils Relative: 5 %
HCT: 36.4 % — ABNORMAL LOW (ref 39.0–52.0)
HEMOGLOBIN: 11.5 g/dL — AB (ref 13.0–17.0)
LYMPHS ABS: 2.3 10*3/uL (ref 0.7–4.0)
Lymphocytes Relative: 28 %
MCH: 24.7 pg — AB (ref 26.0–34.0)
MCHC: 31.6 g/dL (ref 30.0–36.0)
MCV: 78.1 fL (ref 78.0–100.0)
Monocytes Absolute: 1 10*3/uL (ref 0.1–1.0)
Monocytes Relative: 11 %
NEUTROS PCT: 56 %
Neutro Abs: 4.8 10*3/uL (ref 1.7–7.7)
Platelets: 208 10*3/uL (ref 150–400)
RBC: 4.66 MIL/uL (ref 4.22–5.81)
RDW: 15.6 % — ABNORMAL HIGH (ref 11.5–15.5)
WBC: 8.5 10*3/uL (ref 4.0–10.5)

## 2016-01-12 LAB — HEPATITIS C GENOTYPE

## 2016-01-12 LAB — HCV RNA QUANT RFLX ULTRA OR GENOTYP
HCV RNA QNT(LOG COPY/ML): 4.563 {Log_IU}/mL
HEPATITIS C QUANTITATION: 36600 [IU]/mL

## 2016-01-12 LAB — VANCOMYCIN, TROUGH: Vancomycin Tr: 13 ug/mL — ABNORMAL LOW (ref 15–20)

## 2016-01-12 MED ORDER — POLYETHYLENE GLYCOL 3350 17 G PO PACK
17.0000 g | PACK | Freq: Every day | ORAL | Status: DC | PRN
  Filled 2016-01-12: qty 1

## 2016-01-12 MED ORDER — DOCUSATE SODIUM 100 MG PO CAPS
100.0000 mg | ORAL_CAPSULE | Freq: Two times a day (BID) | ORAL | Status: DC | PRN
  Administered 2016-01-12 – 2016-01-14 (×2): 100 mg via ORAL
  Filled 2016-01-12 (×2): qty 1

## 2016-01-12 MED ORDER — VANCOMYCIN HCL 10 G IV SOLR
1250.0000 mg | Freq: Three times a day (TID) | INTRAVENOUS | Status: DC
  Administered 2016-01-12 – 2016-01-16 (×13): 1250 mg via INTRAVENOUS
  Filled 2016-01-12 (×15): qty 1250

## 2016-01-12 NOTE — Progress Notes (Signed)
Pharmacy Antibiotic Note  Kevin Austin is a 25 y.o. male admitted on 01/08/2016 with sepsis/osteomyelitis.  Pharmacy has been consulted for Vancomycin dosing.  IVDA with MRSA sepsis, well known to pharmacy from previous dosing Vancomycin trough came back at 13 this AM. This is a true trough today. We'll increase the dose slightly.   Plan:  Increase vanc to 1.25 gram IV Q 8 hours Long term course of antibiotics planned Check Vancomycin trough when appropriate  Height: 6' (182.9 cm) Weight: 178 lb 9.6 oz (81 kg) IBW/kg (Calculated) : 77.6  Temp (24hrs), Avg:98.3 F (36.8 C), Min:97.8 F (36.6 C), Max:98.8 F (37.1 C)   Recent Labs Lab 01/08/16 0533 01/10/16 0933 01/11/16 0441 01/12/16 0202 01/12/16 0858  WBC 5.3 6.5 7.3 8.5  --   CREATININE 0.63 0.67 0.60* 0.69  --   VANCOTROUGH  --   --   --   --  13*    Estimated Creatinine Clearance: 157.6 mL/min (by C-G formula based on SCr of 0.69 mg/dL).    Allergies  Allergen Reactions  . [facility]ac Nausea Only    [facility]  . Tramadol Nausea Only    [facility]     [name], PharmD Pager: [phone] 01/12/2016 10:06 AM

## 2016-01-12 NOTE — Progress Notes (Signed)
SPORTS MEDICINE AND JOINT REPLACEMENT  Georgena Spurling, MD    Laurier Nancy, PA-C [address] [place] [address], [place], [place]  [address]                             [phone]   PROGRESS NOTE  Subjective:  negative for Chest Pain  negative for Shortness of Breath  negative for Nausea/Vomiting   negative for Calf Pain  negative for Bowel Movement   Tolerating Diet: yes         Patient reports pain as 4 on 0-10 scale.    Objective: Vital signs in last 24 hours:   Patient Vitals for the past 24 hrs:  BP Temp Temp src Pulse Resp SpO2  01/12/16 0955 133/83 98.1 F (36.7 C) Oral 99 20 100 %  01/12/16 0644 117/69 97.8 F (36.6 C) Oral 83 18 100 %  01/12/16 0110 128/76 98.5 F (36.9 C) Oral 94 18 100 %  01/11/16 2215 116/66 97.8 F (36.6 C) Oral 89 18 100 %  01/11/16 1759 123/72 98.5 F (36.9 C) Oral (!) 102 18 98 %  01/11/16 1123 (!) 144/77 98.8 F (37.1 C) Oral 92 19 98 %    @flow {1959:LAST@   Intake/Output from previous day:   09/15 0701 - 09/16 0700 In: 250 [P.O.:250] Out: 1850 [Urine:1850]   Intake/Output this shift:   09/16 0701 - 09/16 1900 In: -  Out: 175 [Urine:175]   Intake/Output      09/15 0701 - 09/16 0700 09/16 0701 - 09/17 0700   P.O. 250    I.V. (mL/kg)     IV Piggyback     Total Intake(mL/kg) 250 (3.1)    Urine (mL/kg/hr) 1850 (1) 175 (0.6)   Blood     Total Output 1850 175   Net -1600 -175           LABORATORY DATA:  Recent Labs  01/08/16 0533 01/10/16 0933 01/11/16 0441 01/12/16 0202  WBC 5.3 6.5 7.3 8.5  HGB 11.4* 11.7* 11.8* 11.5*  HCT 36.2* 37.0* 36.7* 36.4*  PLT 184 200 216 208    Recent Labs  01/08/16 0533 01/10/16 0933 01/11/16 0441 01/12/16 0202  NA 137 139 137 137  K 3.7 4.0 3.8 4.1  CL 103 105 99* 99*  CO2 26 26 29 30   BUN 13 14 9 12   CREATININE 0.63 0.67 0.60* 0.69  GLUCOSE 122* 102* 96 119*  CALCIUM 9.5 9.4 9.6 9.7   Lab Results  Component Value Date   INR 1.16 11/04/2015   INR 1.13 10/31/2015   INR 1.35  10/23/2015    Examination:  General appearance: alert, cooperative and no distress Extremities: extremities normal, atraumatic, no cyanosis or edema  Wound Exam: draining   Drainage:  Scant/small amount Serous exudate  Motor Exam: Quadriceps and Hamstrings Intact  Sensory Exam: Superficial Peroneal, Deep Peroneal and Tibial normal   Assessment:    2 Days Post-Op  Procedure(s) (LRB): IRRIGATION AND DEBRIDEMENT EXTREMITY DEBRIDEMENT SYDESMOTIC, ARTHOTOMY I&D TIBIA (Right)  ADDITIONAL DIAGNOSIS:  Principal Problem:   Osteomyelitis of right tibia (HCC) Active Problems:   IV drug abuse   MRSA (methicillin resistant staph aureus) culture positive   Right ankle pain   Microcytic anemia   Transaminitis   Hepatitis C   Anxiety  Acute Blood Loss Anemia   Plan: Physical Therapy as ordered Weight Bearing as Tolerated (WBAT)  DVT Prophylaxis:  Lovenox  DISCHARGE PLAN: Home  Patient doing well from  Orthopedic standpoint. Will continue to follow until medicine clears patient for D/C        Guy SandiferColby Alan Hazem Kenner 01/12/2016, 10:23 AM

## 2016-01-12 NOTE — Progress Notes (Signed)
PROGRESS NOTE    Kevin Austin  ZOX:[id number] DOB: 1990-07-09 DOA: 01/08/2016 PCP: No primary care provider on file.   Outpatient Specialists: Cardiothoracic surgery: Dr. Dorris FetchHendrickson Orthopedic surgery: Dr. Renaye Rakersim Murphy Hand surgery: Dr. Merlyn LotKuzma ID: RCID, seen by Dr. Luciana Axeomer inpatient   Brief Narrative:  Kevin Austin is a 25 yo male with history of IVDU and recent MRSA bacteremia and complications who [facility] with worsening right foot pain and swelling. He had been at home for 3 weeks following prolonged hospitalization which included multiple debridements of abscesses for septic emboli, including the right ankle by Dr. Eulah PontMurphy on 7/6. He had been taking doxycycline as directed but unfortunately had to reschedule several follow up appointments and so he hasn't followed up. He noted worsening pain when weight bearing radiating up the leg with swelling of the right ankle and intermittent fevers. Doppler U/S ruled out DVT. CT of the right ankle demonstrated periosteal reaction of the distal tibia consistent with osteomyelitis. [facility]   Assessment & Plan:   Principal Problem:   Osteomyelitis of right tibia [facility](HCC) Active Problems:   IV drug abuse   MRSA (methicillin resistant staph aureus) culture positive   Right ankle pain   Microcytic anemia   Transaminitis   Hepatitis C   Anxiety   Acute right tibia osteomyelitis - Blood cultures NGTD - follow  - S/p I&D 9/14 with Dr. Wandra Feinstein. Murphy  - Non weight bearing for 1 week  - Continue vanco, ceftriaxone  - Appreciate ortho, ID    Sternal wound s/p debridement - WOC consulted: Bactroban to promote moist healing, foam dressing to protect from further injury.  Transaminitis:Hep C Ab+, other panel neg, HIV neg.  - Trend CMP - Follow up with GI outpatient for hep C work up and treatment  - HIV: NR - HCV RNA pending   Acute microcytic anemia on chronic normocytic anemia:Presumably acute blood loss. Trending  slowly upward since postoperative blood draws - Stable  Anxiety:  - Continue lorazepam prn  History of IVDU: Pt discharged with benzodiazepines as outpatient and UDS only positive for THC.   DVT prophylaxis: Lovenox Code Status: Full Family Communication: No family in room during exam  Disposition Plan: Continue antibiotic treatment. Await further recommendations for abx treatment and right ankle follow up prior to discharge. Await culture.   Consultants:   ID, Dr. Gilmore LarocheHatcher  Ortho, Dr. Renaye Rakersim Murphy  Procedures:   I&D Dr. Eulah PontMurphy 9/14   Antimicrobials:  Doxycycline 9/12 - 9/14  Vanco 9/14 >>   Ceftriaxone 9/15 >>  Subjective: Patient seen and examined laying in bed comfortably. He states that he still continues to have significant pain in his right ankle. He has been afebrile. He denies any chest pains, shortness of breath, nausea, vomiting, diarrhea, abdominal pain, dysuria. No complaints.   Objective: Vitals:   01/11/16 1759 01/11/16 2215 01/12/16 0110 01/12/16 0644  BP: 123/72 116/66 128/76 117/69  Pulse: (!) 102 89 94 83  Resp: 18 18 18 18   Temp: 98.5 F (36.9 C) 97.8 F (36.6 C) 98.5 F (36.9 C) 97.8 F (36.6 C)  TempSrc: Oral Oral Oral Oral  SpO2: 98% 100% 100% 100%  Weight:      Height:        Intake/Output Summary (Last 24 hours) at 01/12/16 0944 Last data filed at 01/12/16 0646  Gross per 24 hour  Intake              250 ml  Output             1850 ml  Net            -1600 ml   Filed Weights   01/08/16 0301  Weight: 81 kg (178 lb 9.6 oz)    Examination:  General exam: Appears calm and comfortable  Respiratory system: Clear to auscultation. Respiratory effort normal. Cardiovascular system: S1 & S2 heard, tachycardic, regular. No JVD, murmurs, rubs, gallops or clicks. Gastrointestinal system: Abdomen is nondistended, soft and nontender. No organomegaly or masses felt. Normal bowel sounds heard. Central nervous system: Alert and oriented. No  focal neurological deficits. Lethargic Extremities: Symmetric 5 x 5 power. Right ankle dressed post operatively Skin: No rashes, lesions or ulcers, multiple healed scars on right ankle, left dorsal foot, right hand, +sternal wound dressed  Psychiatry: Judgement and insight appear normal. Mood & affect appropriate.   Data Reviewed: I have personally reviewed following labs and imaging studies  CBC:  Recent Labs Lab 01/08/16 0533 01/10/16 0933 01/11/16 0441 01/12/16 0202  WBC 5.3 6.5 7.3 8.5  NEUTROABS 2.4 3.7 4.0 4.8  HGB 11.4* 11.7* 11.8* 11.5*  HCT 36.2* 37.0* 36.7* 36.4*  MCV 79.0 78.7 78.1 78.1  PLT 184 200 216 208   Basic Metabolic Panel:  Recent Labs Lab 01/08/16 0533 01/10/16 0933 01/11/16 0441 01/12/16 0202  NA 137 139 137 137  K 3.7 4.0 3.8 4.1  CL 103 105 99* 99*  CO2 26 26 29 30   GLUCOSE 122* 102* 96 119*  BUN 13 14 9 12   CREATININE 0.63 0.67 0.60* 0.69  CALCIUM 9.5 9.4 9.6 9.7   GFR: Estimated Creatinine Clearance: 157.6 mL/min (by C-G formula based on SCr of 0.69 mg/dL). Liver Function Tests:  Recent Labs Lab 01/08/16 0533 01/10/16 0933 01/11/16 0441 01/12/16 0202  AST 98* 103* 77* 64*  ALT 221* 223* 188* 168*  ALKPHOS 163* 167* 155* 151*  BILITOT 0.8 0.6 0.8 0.7  PROT 7.2 7.5 7.8 7.6  ALBUMIN 3.5 3.8 3.6 3.7   No results for input(s): LIPASE, AMYLASE in the last 168 hours. No results for input(s): AMMONIA in the last 168 hours. Coagulation Profile: No results for input(s): INR, PROTIME in the last 168 hours. Cardiac Enzymes: No results for input(s): CKTOTAL, CKMB, CKMBINDEX, TROPONINI in the last 168 hours. BNP (last 3 results) No results for input(s): PROBNP in the last 8760 hours. HbA1C: No results for input(s): HGBA1C in the last 72 hours. CBG: No results for input(s): GLUCAP in the last 168 hours. Lipid Profile: No results for input(s): CHOL, HDL, LDLCALC, TRIG, CHOLHDL, LDLDIRECT in the last 72 hours. Thyroid Function Tests: No  results for input(s): TSH, T4TOTAL, FREET4, T3FREE, THYROIDAB in the last 72 hours. Anemia Panel: No results for input(s): VITAMINB12, FOLATE, FERRITIN, TIBC, IRON, RETICCTPCT in the last 72 hours. Sepsis Labs: No results for input(s): PROCALCITON, LATICACIDVEN in the last 168 hours.  Recent Results (from the past 240 hour(s))  Culture, blood (routine x 2)     Status: None (Preliminary result)   Collection Time: 01/08/16  5:21 AM  Result Value Ref Range Status   Specimen Description BLOOD LEFT ANTECUBITAL  Final   Special Requests BOTTLES DRAWN AEROBIC AND ANAEROBIC 5CC  Final   Culture NO GROWTH 3 DAYS  Final   Report Status PENDING  Incomplete  Culture, blood (routine x 2)     Status: None (Preliminary result)   Collection Time: 01/08/16  5:24 AM  Result Value Ref Range  Status   Specimen Description BLOOD LEFT ANTECUBITAL  Final   Special Requests BOTTLES DRAWN AEROBIC AND ANAEROBIC 5CC  Final   Culture NO GROWTH 3 DAYS  Final   Report Status PENDING  Incomplete  MRSA PCR Screening     Status: None   Collection Time: 01/08/16  2:21 PM  Result Value Ref Range Status   MRSA by PCR NEGATIVE NEGATIVE Final    Comment:        The GeneXpert MRSA Assay (FDA approved for NASAL specimens only), is one component of a comprehensive MRSA colonization surveillance program. It is not intended to diagnose MRSA infection nor to guide or monitor treatment for MRSA infections.   Aerobic/Anaerobic Culture (surgical/deep wound)     Status: None (Preliminary result)   Collection Time: 01/10/16  8:24 AM  Result Value Ref Range Status   Specimen Description TISSUE RIGHT ANKLE  Final   Special Requests NONE  Final   Gram Stain   Final    RARE WBC PRESENT, PREDOMINANTLY MONONUCLEAR RARE GRAM NEGATIVE RODS    Culture NO GROWTH 1 DAY  Final   Report Status PENDING  Incomplete         Radiology Studies: No results found.      Scheduled Meds: . cefTRIAXone (ROCEPHIN)  IV  2 g  Intravenous Q24H  . enoxaparin (LOVENOX) injection  40 mg Subcutaneous Q24H  . feeding supplement (ENSURE ENLIVE)  237 mL Oral BID BM  . mupirocin cream   Topical Daily  . vancomycin  1,000 mg Intravenous Q8H   Continuous Infusions:    LOS: 4 days    Time spent: 30 minutes     Noralee Stain, DO [facility] Pager [phone]  If 7PM-7AM, please contact night-coverage [email or web address] Password [facility] 01/12/2016, 9:44 AM

## 2016-01-13 LAB — CBC WITH DIFFERENTIAL/PLATELET
BASOS ABS: 0 10*3/uL (ref 0.0–0.1)
BASOS PCT: 0 %
EOS ABS: 0.3 10*3/uL (ref 0.0–0.7)
EOS PCT: 4 %
HCT: 37.3 % — ABNORMAL LOW (ref 39.0–52.0)
Hemoglobin: 11.8 g/dL — ABNORMAL LOW (ref 13.0–17.0)
Lymphocytes Relative: 34 %
Lymphs Abs: 2.4 10*3/uL (ref 0.7–4.0)
MCH: 24.5 pg — ABNORMAL LOW (ref 26.0–34.0)
MCHC: 31.6 g/dL (ref 30.0–36.0)
MCV: 77.5 fL — AB (ref 78.0–100.0)
MONO ABS: 0.7 10*3/uL (ref 0.1–1.0)
Monocytes Relative: 10 %
Neutro Abs: 3.7 10*3/uL (ref 1.7–7.7)
Neutrophils Relative %: 52 %
PLATELETS: 224 10*3/uL (ref 150–400)
RBC: 4.81 MIL/uL (ref 4.22–5.81)
RDW: 15.5 % (ref 11.5–15.5)
WBC: 7.1 10*3/uL (ref 4.0–10.5)

## 2016-01-13 LAB — CULTURE, BLOOD (ROUTINE X 2)
Culture: NO GROWTH
Culture: NO GROWTH

## 2016-01-13 LAB — COMPREHENSIVE METABOLIC PANEL
ALBUMIN: 3.7 g/dL (ref 3.5–5.0)
ALT: 153 U/L — AB (ref 17–63)
ANION GAP: 14 (ref 5–15)
AST: 67 U/L — AB (ref 15–41)
Alkaline Phosphatase: 150 U/L — ABNORMAL HIGH (ref 38–126)
BUN: 15 mg/dL (ref 6–20)
CALCIUM: 9.9 mg/dL (ref 8.9–10.3)
CO2: 26 mmol/L (ref 22–32)
Chloride: 97 mmol/L — ABNORMAL LOW (ref 101–111)
Creatinine, Ser: 0.6 mg/dL — ABNORMAL LOW (ref 0.61–1.24)
GFR calc non Af Amer: >60 mL/min (ref >60)
Glucose, Bld: 93 mg/dL (ref 65–99)
POTASSIUM: 4.1 mmol/L (ref 3.5–5.1)
SODIUM: 137 mmol/L (ref 135–145)
TOTAL PROTEIN: 7.6 g/dL (ref 6.5–8.1)
Total Bilirubin: 0.6 mg/dL (ref 0.3–1.2)

## 2016-01-13 MED ORDER — TRAMADOL HCL 50 MG PO TABS: 50.0000 mg | ORAL_TABLET | Freq: Two times a day (BID) | ORAL | Status: DC | PRN

## 2016-01-13 MED ORDER — ONDANSETRON HCL 4 MG/2ML IJ SOLN: 4.0000 mg | Freq: Four times a day (QID) | INTRAMUSCULAR | Status: DC | PRN

## 2016-01-13 NOTE — Progress Notes (Signed)
SPORTS MEDICINE AND JOINT REPLACEMENT  Georgena Spurling, MD    Laurier Nancy, PA-C [address] [place], [place], [place]  [address]                             [phone]   PROGRESS NOTE  Subjective:  negative for Chest Pain  negative for Shortness of Breath  negative for Nausea/Vomiting   negative for Calf Pain  negative for Bowel Movement   Tolerating Diet: yes         Patient reports pain as 5 on 0-10 scale.    Objective: Vital signs in last 24 hours:   Patient Vitals for the past 24 hrs:  BP Temp Temp src Pulse Resp SpO2  01/13/16 0907 129/77 98.1 F (36.7 C) Oral (!) 102 20 97 %  01/13/16 0533 118/73 98.6 F (37 C) Oral 97 20 100 %  01/13/16 0151 118/77 98.8 F (37.1 C) Oral (!) 104 20 100 %  01/12/16 2159 119/75 98.2 F (36.8 C) Oral (!) 105 20 100 %  01/12/16 1838 115/75 97.8 F (36.6 C) Oral (!) 103 - 98 %  01/12/16 1357 118/65 98.3 F (36.8 C) Oral 98 20 98 %  01/12/16 0955 133/83 98.1 F (36.7 C) Oral 99 20 100 %    @flow {1959:LAST@   Intake/Output from previous day:   09/16 0701 - 09/17 0700 In: 480 [P.O.:480] Out: 900 [Urine:900]   Intake/Output this shift:   09/17 0701 - 09/17 1900 In: -  Out: 375 [Urine:375]   Intake/Output      09/16 0701 - 09/17 0700 09/17 0701 - 09/18 0700   P.O. 480    Total Intake(mL/kg) 480 (5.9)    Urine (mL/kg/hr) 900 (0.5) 375 (1.8)   Stool 0 (0)    Total Output 900 375   Net -420 -375        Urine Occurrence 2 x    Stool Occurrence 1 x       LABORATORY DATA:  Recent Labs  01/08/16 0533 01/10/16 0933 01/11/16 0441 01/12/16 0202 01/13/16 0441  WBC 5.3 6.5 7.3 8.5 7.1  HGB 11.4* 11.7* 11.8* 11.5* 11.8*  HCT 36.2* 37.0* 36.7* 36.4* 37.3*  PLT 184 200 216 208 224    Recent Labs  01/08/16 0533 01/10/16 0933 01/11/16 0441 01/12/16 0202 01/13/16 0441  NA 137 139 137 137 137  K 3.7 4.0 3.8 4.1 4.1  CL 103 105 99* 99* 97*  CO2 26 26 29 30 26   BUN 13 14 9 12 15   CREATININE 0.63 0.67 0.60* 0.69 0.60*   GLUCOSE 122* 102* 96 119* 93  CALCIUM 9.5 9.4 9.6 9.7 9.9   Lab Results  Component Value Date   INR 1.16 11/04/2015   INR 1.13 10/31/2015   INR 1.35 10/23/2015    Examination:  General appearance: alert, cooperative and no distress Extremities: extremities normal, atraumatic, no cyanosis or edema  Wound Exam: clean, dry, intact   Drainage:  None: wound tissue dry  Motor Exam: Quadriceps and Hamstrings Intact  Sensory Exam: Superficial Peroneal, Deep Peroneal and Tibial normal   Assessment:    3 Days Post-Op  Procedure(s) (LRB): IRRIGATION AND DEBRIDEMENT EXTREMITY DEBRIDEMENT SYDESMOTIC, ARTHOTOMY I&D TIBIA (Right)  ADDITIONAL DIAGNOSIS:  Principal Problem:   Osteomyelitis of right tibia (HCC) Active Problems:   IV drug abuse   MRSA (methicillin resistant staph aureus) culture positive   Right ankle pain   Microcytic anemia  Transaminitis   Hepatitis C   Anxiety  Acute Blood Loss Anemia   Plan: Physical Therapy as ordered Weight Bearing as Tolerated (WBAT)  DVT Prophylaxis:  Lovenox  DISCHARGE PLAN: Home   Patient still doing very well from ortho standpoint. Ok to D/C once cleared by medicine and on appropriate antibiotics. Will follow up with patient in office.         Guy SandiferColby Alan Norine Reddington 01/13/2016, 9:33 AM

## 2016-01-13 NOTE — Progress Notes (Signed)
PROGRESS NOTE    [name]  ZOX:[id number] DOB: 11-05-1990 DOA: 01/08/2016 PCP: No primary care provider on file.   Outpatient Specialists: Cardiothoracic surgery: Dr. Dorris Fetch Orthopedic surgery: Dr. Renaye Rakers Hand surgery: Dr. Merlyn Lot ID: [id number], seen by Dr. Luciana Axe inpatient   Brief Narrative:  Kevin Austin is a 25 yo male with history of IVDU and recent MRSA bacteremia and complications who presented to [facility] ED 9/11 with worsening right foot pain and swelling. He had been at home for 3 weeks following prolonged hospitalization which included multiple debridements of abscesses for septic emboli, including the right ankle by Dr. Eulah Pont on 7/6. He had been taking doxycycline as directed but unfortunately had to reschedule several follow up appointments and so he hasn't followed up. He noted worsening pain when weight bearing radiating up the leg with swelling of the right ankle and intermittent fevers. Doppler U/S ruled out DVT. CT of the right ankle demonstrated periosteal reaction of the distal tibia consistent with osteomyelitis. He was transferred to [facility].   MRI was obtained which showed acute osteomyelitis of right tibia. Patient was initially started on doxycycline. Orthopedic surgery was consulted and performed an I&D on 9/14. Infectious disease was also consulted who started vancomycin and then ceftriaxone as well.   Assessment & Plan:   Principal Problem:   Osteomyelitis of right tibia [facility]) Active Problems:   IV drug abuse   MRSA (methicillin resistant staph aureus) culture positive   Right ankle pain   Microcytic anemia   Transaminitis   Hepatitis C   Anxiety   Acute right tibia osteomyelitis - S/p I&D 9/14 with Dr. Wandra Feinstein  - Non weight bearing for 1 week, boot in place  - Follow up with ortho in office, they have signed off - Blood cultures NGTD - follow  - Wound culture pending  - Continue vanco, ceftriaxone  - Await final antibiotic rec from ID; patient with hx of  IVDA so would prefer oral regimen   Sternal wound s/p debridement - WOC consulted: Bactroban to promote moist healing, foam dressing to protect from further injury.  Transaminitis:Hep C Ab+, other panel neg, HIV neg.  - Trend CMP - Follow up with GI outpatient for hep C work up and treatment  - HIV: NR - HCV RNA   Acute microcytic anemia on chronic normocytic anemia:Presumably acute blood loss. Trending slowly upward since postoperative blood draws - Stable  Anxiety:  - Continue lorazepam prn  History of IVDU: Pt discharged with benzodiazepines as outpatient and UDS only positive for THC.   DVT prophylaxis: Lovenox Code Status: Full Family Communication: No family in room during exam  Disposition Plan: Continue antibiotic treatment. Await further recommendations for abx treatment prior to discharge. Await culture.   Consultants:   ID, Dr. Gilmore Laroche, Dr. Renaye Rakers  Procedures:   I&D Dr. Eulah Pont 9/14   Antimicrobials:  Doxycycline 9/12 - 9/14  Vanco 9/14 >>   Ceftriaxone 9/15 >>  Subjective: Patient seen and examined laying in bed comfortably. He states that he still continues to have significant pain in his right ankle. He has been afebrile. He denies any chest pains, shortness of breath, nausea, vomiting, diarrhea, abdominal pain, dysuria. No complaints.   Objective: Vitals:   01/12/16 2159 01/13/16 0151 01/13/16 0533 01/13/16 0907  BP: 119/75 118/77 118/73 129/77  Pulse: (!) 105 (!) 104 97 (!) 102  Resp: 20 20 20 20   Temp: 98.2 F (36.8 C) 98.8 F (37.1 C) 98.6 F (37  C) 98.1 F (36.7 C)  TempSrc: Oral Oral Oral Oral  SpO2: 100% 100% 100% 97%  Weight:      Height:        Intake/Output Summary (Last 24 hours) at 01/13/16 1110 Last data filed at 01/13/16 0900  Gross per 24 hour  Intake              480 ml  Output             1100 ml  Net             -620 ml   Filed Weights   01/08/16 0301  Weight: 81 kg (178 lb 9.6 oz)     Examination:  General exam: Appears calm and comfortable  Respiratory system: Clear to auscultation. Respiratory effort normal. Cardiovascular system: S1 & S2 heard, tachycardic, regular. No JVD, murmurs, rubs, gallops or clicks. Gastrointestinal system: Abdomen is nondistended, soft and nontender. No organomegaly or masses felt. Normal bowel sounds heard. Central nervous system: Alert and oriented. No focal neurological deficits. Lethargic Extremities: Symmetric 5 x 5 power. Right ankle dressed post operatively Skin: No rashes, lesions or ulcers, multiple healed scars on right ankle, left dorsal foot, right hand, +sternal wound dressed  Psychiatry: Judgement and insight appear normal. Mood & affect appropriate.   Data Reviewed: I have personally reviewed following labs and imaging studies  CBC:  Recent Labs Lab 01/08/16 0533 01/10/16 0933 01/11/16 0441 01/12/16 0202 01/13/16 0441  WBC 5.3 6.5 7.3 8.5 7.1  NEUTROABS 2.4 3.7 4.0 4.8 3.7  HGB 11.4* 11.7* 11.8* 11.5* 11.8*  HCT 36.2* 37.0* 36.7* 36.4* 37.3*  MCV 79.0 78.7 78.1 78.1 77.5*  PLT 184 200 216 208 224   Basic Metabolic Panel:  Recent Labs Lab 01/08/16 0533 01/10/16 0933 01/11/16 0441 01/12/16 0202 01/13/16 0441  NA 137 139 137 137 137  K 3.7 4.0 3.8 4.1 4.1  CL 103 105 99* 99* 97*  CO2 26 26 29 30 26   GLUCOSE 122* 102* 96 119* 93  BUN 13 14 9 12 15   CREATININE 0.63 0.67 0.60* 0.69 0.60*  CALCIUM 9.5 9.4 9.6 9.7 9.9   GFR: Estimated Creatinine Clearance: 157.6 mL/min (by C-G formula based on SCr of 0.6 mg/dL (L)). Liver Function Tests:  Recent Labs Lab 01/08/16 0533 01/10/16 0933 01/11/16 0441 01/12/16 0202 01/13/16 0441  AST 98* 103* 77* 64* 67*  ALT 221* 223* 188* 168* 153*  ALKPHOS 163* 167* 155* 151* 150*  BILITOT 0.8 0.6 0.8 0.7 0.6  PROT 7.2 7.5 7.8 7.6 7.6  ALBUMIN 3.5 3.8 3.6 3.7 3.7   No results for input(s): LIPASE, AMYLASE in the last 168 hours. No results for input(s): AMMONIA  in the last 168 hours. Coagulation Profile: No results for input(s): INR, PROTIME in the last 168 hours. Cardiac Enzymes: No results for input(s): CKTOTAL, CKMB, CKMBINDEX, TROPONINI in the last 168 hours. BNP (last 3 results) No results for input(s): PROBNP in the last 8760 hours. HbA1C: No results for input(s): HGBA1C in the last 72 hours. CBG: No results for input(s): GLUCAP in the last 168 hours. Lipid Profile: No results for input(s): CHOL, HDL, LDLCALC, TRIG, CHOLHDL, LDLDIRECT in the last 72 hours. Thyroid Function Tests: No results for input(s): TSH, T4TOTAL, FREET4, T3FREE, THYROIDAB in the last 72 hours. Anemia Panel: No results for input(s): VITAMINB12, FOLATE, FERRITIN, TIBC, IRON, RETICCTPCT in the last 72 hours. Sepsis Labs: No results for input(s): PROCALCITON, LATICACIDVEN in the last 168 hours.  Recent Results (  from the past 240 hour(s))  Culture, blood (routine x 2)     Status: None (Preliminary result)   Collection Time: 01/08/16  5:21 AM  Result Value Ref Range Status   Specimen Description BLOOD LEFT ANTECUBITAL  Final   Special Requests BOTTLES DRAWN AEROBIC AND ANAEROBIC 5CC  Final   Culture NO GROWTH 4 DAYS  Final   Report Status PENDING  Incomplete  Culture, blood (routine x 2)     Status: None (Preliminary result)   Collection Time: 01/08/16  5:24 AM  Result Value Ref Range Status   Specimen Description BLOOD LEFT ANTECUBITAL  Final   Special Requests BOTTLES DRAWN AEROBIC AND ANAEROBIC 5CC  Final   Culture NO GROWTH 4 DAYS  Final   Report Status PENDING  Incomplete  MRSA PCR Screening     Status: None   Collection Time: 01/08/16  2:21 PM  Result Value Ref Range Status   MRSA by PCR NEGATIVE NEGATIVE Final    Comment:        The GeneXpert MRSA Assay (FDA approved for NASAL specimens only), is one component of a comprehensive MRSA colonization surveillance program. It is not intended to diagnose MRSA infection nor to guide or monitor treatment  for MRSA infections.   Aerobic/Anaerobic Culture (surgical/deep wound)     Status: None (Preliminary result)   Collection Time: 01/10/16  8:24 AM  Result Value Ref Range Status   Specimen Description TISSUE RIGHT ANKLE  Final   Special Requests NONE  Final   Gram Stain   Final    RARE WBC PRESENT, PREDOMINANTLY MONONUCLEAR RARE GRAM NEGATIVE RODS    Culture   Final    NO GROWTH 2 DAYS NO ANAEROBES ISOLATED; CULTURE IN PROGRESS FOR 5 DAYS   Report Status PENDING  Incomplete         Radiology Studies: No results found.      Scheduled Meds: . cefTRIAXone (ROCEPHIN)  IV  2 g Intravenous Q24H  . enoxaparin (LOVENOX) injection  40 mg Subcutaneous Q24H  . feeding supplement (ENSURE ENLIVE)  237 mL Oral BID BM  . mupirocin cream   Topical Daily  . vancomycin  1,250 mg Intravenous Q8H   Continuous Infusions:    LOS: 5 days    Time spent: 30 minutes     Noralee StainJennifer Ghalia Reicks, DO [facility] Pager [phone]  If 7PM-7AM, please contact night-coverage [email or web address] Password [email or web address] 01/13/2016, 11:10 AM

## 2016-01-14 DIAGNOSIS — Z8614 Personal history of Methicillin resistant Staphylococcus aureus infection: Secondary | ICD-10-CM

## 2016-01-14 DIAGNOSIS — B1920 Unspecified viral hepatitis C without hepatic coma: Secondary | ICD-10-CM

## 2016-01-14 LAB — CBC WITH DIFFERENTIAL/PLATELET
BASOS ABS: 0 10*3/uL (ref 0.0–0.1)
Basophils Relative: 0 %
Eosinophils Absolute: 0.3 10*3/uL (ref 0.0–0.7)
Eosinophils Relative: 4 %
HEMATOCRIT: 36.1 % — AB (ref 39.0–52.0)
Hemoglobin: 11.7 g/dL — ABNORMAL LOW (ref 13.0–17.0)
LYMPHS ABS: 2.2 10*3/uL (ref 0.7–4.0)
LYMPHS PCT: 33 %
MCH: 25.3 pg — AB (ref 26.0–34.0)
MCHC: 32.4 g/dL (ref 30.0–36.0)
MCV: 78 fL (ref 78.0–100.0)
MONO ABS: 0.7 10*3/uL (ref 0.1–1.0)
MONOS PCT: 10 %
NEUTROS ABS: 3.5 10*3/uL (ref 1.7–7.7)
Neutrophils Relative %: 53 %
Platelets: 203 10*3/uL (ref 150–400)
RBC: 4.63 MIL/uL (ref 4.22–5.81)
RDW: 15.4 % (ref 11.5–15.5)
WBC: 6.6 10*3/uL (ref 4.0–10.5)

## 2016-01-14 LAB — COMPREHENSIVE METABOLIC PANEL
ALT: 154 U/L — ABNORMAL HIGH (ref 17–63)
AST: 71 U/L — AB (ref 15–41)
Albumin: 3.5 g/dL (ref 3.5–5.0)
Alkaline Phosphatase: 154 U/L — ABNORMAL HIGH (ref 38–126)
Anion gap: 5 (ref 5–15)
BILIRUBIN TOTAL: 0.5 mg/dL (ref 0.3–1.2)
BUN: 15 mg/dL (ref 6–20)
CO2: 29 mmol/L (ref 22–32)
CREATININE: 0.72 mg/dL (ref 0.61–1.24)
Calcium: 9.5 mg/dL (ref 8.9–10.3)
Chloride: 102 mmol/L (ref 101–111)
GFR calc Af Amer: >60 mL/min (ref >60)
Glucose, Bld: 102 mg/dL — ABNORMAL HIGH (ref 65–99)
POTASSIUM: 4.4 mmol/L (ref 3.5–5.1)
Sodium: 136 mmol/L (ref 135–145)
TOTAL PROTEIN: 7.7 g/dL (ref 6.5–8.1)

## 2016-01-14 MED ORDER — MENTHOL 3 MG MT LOZG
1.0000 | LOZENGE | OROMUCOSAL | Status: DC | PRN
  Administered 2016-01-14: 3 mg via ORAL
  Filled 2016-01-14: qty 9

## 2016-01-14 NOTE — Progress Notes (Signed)
INFECTIOUS DISEASE PROGRESS NOTE  ID: Kevin Austin is a 25 y.o. male with  Principal Problem:   Osteomyelitis of right tibia (HCC) Active Problems:   IV drug abuse   MRSA (methicillin resistant staph aureus) culture positive   Right ankle pain   Microcytic anemia   Transaminitis   Hepatitis C   Anxiety  Subjective: No n/v, no diarrhea. Has had groin rash which he attributes to heat.    Abtx:  Anti-infectives    Start     Dose/Rate Route Frequency Ordered Stop   01/12/16 1100  vancomycin (VANCOCIN) 1,250 mg in sodium chloride 0.9 % 250 mL IVPB     1,250 mg 166.7 mL/hr over 90 Minutes Intravenous Every 8 hours 01/12/16 1007     01/11/16 1900  cefTRIAXone (ROCEPHIN) 2 g in dextrose 5 % 50 mL IVPB     2 g 100 mL/hr over 30 Minutes Intravenous Every 24 hours 01/11/16 1736     01/10/16 1600  vancomycin (VANCOCIN) IVPB 1000 mg/200 mL premix  Status:  Discontinued     1,000 mg 200 mL/hr over 60 Minutes Intravenous Every 8 hours 01/10/16 1509 01/12/16 1007   01/10/16 0819  vancomycin (VANCOCIN) powder  Status:  Discontinued       As needed 01/10/16 0819 01/10/16 0852   01/10/16 0819  gentamicin (GARAMYCIN) injection  Status:  Discontinued       As needed 01/10/16 0820 01/10/16 0852   01/08/16 1000  doxycycline (VIBRA-TABS) tablet 100 mg  Status:  Discontinued    Comments:  X 4 weeks. Further continuation of the antibiotic will be decided by ID clinic     100 mg Oral 2 times daily 01/08/16 0427 01/10/16 1456      Medications:  Scheduled: . cefTRIAXone (ROCEPHIN)  IV  2 g Intravenous Q24H  . enoxaparin (LOVENOX) injection  40 mg Subcutaneous Q24H  . feeding supplement (ENSURE ENLIVE)  237 mL Oral BID BM  . mupirocin cream   Topical Daily  . vancomycin  1,250 mg Intravenous Q8H    Objective: Vital signs in last 24 hours: Temp:  [97.7 F (36.5 C)-99 F (37.2 C)] 98.4 F (36.9 C) (09/18 0557) Pulse Rate:  [87-94] 93 (09/18 0557) Resp:  [20] 20 (09/18 0557) BP:  (113-116)/(54-80) 116/77 (09/18 0557) SpO2:  [98 %-100 %] 100 % (09/18 0557)   General appearance: alert, cooperative and no distress Extremities: RLE wrapped, no proximal erythema or edema.   Lab Results  Recent Labs  01/13/16 0441 01/14/16 0230  WBC 7.1 6.6  HGB 11.8* 11.7*  HCT 37.3* 36.1*  NA 137 136  K 4.1 4.4  CL 97* 102  CO2 26 29  BUN 15 15  CREATININE 0.60* 0.72   Liver Panel  Recent Labs  01/13/16 0441 01/14/16 0230  PROT 7.6 7.7  ALBUMIN 3.7 3.5  AST 67* 71*  ALT 153* 154*  ALKPHOS 150* 154*  BILITOT 0.6 0.5   Sedimentation Rate No results for input(s): ESRSEDRATE in the last 72 hours. C-Reactive Protein No results for input(s): CRP in the last 72 hours.  Microbiology: Recent Results (from the past 240 hour(s))  Culture, blood (routine x 2)     Status: None   Collection Time: 01/08/16  5:21 AM  Result Value Ref Range Status   Specimen Description BLOOD LEFT ANTECUBITAL  Final   Special Requests BOTTLES DRAWN AEROBIC AND ANAEROBIC 5CC  Final   Culture NO GROWTH 5 DAYS  Final   Report Status  01/13/2016 FINAL  Final  Culture, blood (routine x 2)     Status: None   Collection Time: 01/08/16  5:24 AM  Result Value Ref Range Status   Specimen Description BLOOD LEFT ANTECUBITAL  Final   Special Requests BOTTLES DRAWN AEROBIC AND ANAEROBIC 5CC  Final   Culture NO GROWTH 5 DAYS  Final   Report Status 01/13/2016 FINAL  Final  MRSA PCR Screening     Status: None   Collection Time: 01/08/16  2:21 PM  Result Value Ref Range Status   MRSA by PCR NEGATIVE NEGATIVE Final    Comment:        The GeneXpert MRSA Assay (FDA approved for NASAL specimens only), is one component of a comprehensive MRSA colonization surveillance program. It is not intended to diagnose MRSA infection nor to guide or monitor treatment for MRSA infections.   Aerobic/Anaerobic Culture (surgical/deep wound)     Status: None (Preliminary result)   Collection Time: 01/10/16  8:24  AM  Result Value Ref Range Status   Specimen Description TISSUE RIGHT ANKLE  Final   Special Requests NONE  Final   Gram Stain   Final    RARE WBC PRESENT, PREDOMINANTLY MONONUCLEAR RARE GRAM NEGATIVE RODS    Culture   Final    NO GROWTH 2 DAYS NO ANAEROBES ISOLATED; CULTURE IN PROGRESS FOR 5 DAYS   Report Status PENDING  Incomplete    Studies/Results: No results found.   Assessment/Plan: IVDA MRSA Bacteremia 09-2015 Osteomyelitis of TIbia Hepatitis C 1a Total days of antibiotics: 4 vanco/ceftriaxone                                                              His gram stain shows rare GNR but Cx still has not grown.  No change in anbx for now.  Await Cx results.  Hep C therapy as outpt.   His long term abx hinge on his d/c plan- will he go to SNF, rehab or will he go home?         Johny SaxJeffrey Jahdiel Krol Infectious Diseases (pager) [phone] [email or web address] 01/14/2016, 11:01 AM  LOS: 6 days

## 2016-01-14 NOTE — Progress Notes (Signed)
[facility]  PROGRESS NOTE  Kevin Austin QIO:[id number] DOB: 1991/03/04 DOA: 01/08/2016 PCP: No primary care provider on file.   Brief HPI:  25 yo male with history of IVDU and recent MRSA bacteremia and complications who presented to [facility] ED 9/11 with worsening right foot pain and swelling. He had been at home for 3 weeks following prolonged hospitalization which included multiple debridements of abscesses for septic emboli, including the right ankle by Dr. Eulah Pont on 7/6. He had been taking doxycycline as directed but unfortunately had to reschedule several follow up appointments and so he hasn't followed up. He noted worsening pain when weight bearing radiating up the leg with swelling of the right ankle and intermittent fevers. Doppler U/S ruled out DVT. CT of the right ankle demonstrated periosteal reaction of the distal tibia consistent with osteomyelitis. He was transferred to [facility].   MRI was obtained which showed acute osteomyelitis of right tibia. Patient was initially started on doxycycline. Orthopedic surgery was consulted and performed an I&D on 9/14. Infectious disease was also consulted who started vancomycin and then ceftriaxone as well.  .   Subjective    Patient seen and examined, denies pain in the foot at this time. But it has been intermittent.     Assessment/Plan:     1. Acute right tibia osteomyelitis- status post incision and drainage 01/10/2016, weightbearing as tolerated. Follow-up orthopedics as outpatient. Continue IV antibiotics. 2. Sternal wound status post debridement- wound care consulted. Bactroban to promote moist healing, foam dressing to protect from further injury. 3. Transaminitis- liver enzymes slowly improving, hep C antibodies positive, HIV negative. Will  follow-up GI as outpatient. 4. Chronic anemia- hemoglobin stays around 9-10, today hemoglobin is 11.7. 5. History of IV drug abuse- patient cannot be discharged home on PICC line.      DVT  prophylaxis: Lovenox  Code Status: Full code  Family Communication: No family at bedside   Disposition Plan: Pending PT evaluation   Consultants:  Infection disease  Orthopedics  Procedures:  Incision and drainage Dr. Eulah Pont on 01/10/2016  Antibiotics:   Anti-infectives    Start     Dose/Rate Route Frequency Ordered Stop   01/12/16 1100  vancomycin (VANCOCIN) 1,250 mg in sodium chloride 0.9 % 250 mL IVPB     1,250 mg 166.7 mL/hr over 90 Minutes Intravenous Every 8 hours 01/12/16 1007     01/11/16 1900  cefTRIAXone (ROCEPHIN) 2 g in dextrose 5 % 50 mL IVPB     2 g 100 mL/hr over 30 Minutes Intravenous Every 24 hours 01/11/16 1736     01/10/16 1600  vancomycin (VANCOCIN) IVPB 1000 mg/200 mL premix  Status:  Discontinued     1,000 mg 200 mL/hr over 60 Minutes Intravenous Every 8 hours 01/10/16 1509 01/12/16 1007   01/10/16 0819  vancomycin (VANCOCIN) powder  Status:  Discontinued       As needed 01/10/16 0819 01/10/16 0852   01/10/16 0819  gentamicin (GARAMYCIN) injection  Status:  Discontinued       As needed 01/10/16 0820 01/10/16 0852   01/08/16 1000  doxycycline (VIBRA-TABS) tablet 100 mg  Status:  Discontinued    Comments:  X 4 weeks. Further continuation of the antibiotic will be decided by ID clinic     100 mg Oral 2 times daily 01/08/16 0427 01/10/16 1456       Objective   Vitals:   01/13/16 2134 01/14/16 0120 01/14/16 0557 01/14/16 1027  BP: 113/77 115/80 116/77 110/68  Pulse:  93 87 93 88  Resp: 20 20 20 20   Temp: 99 F (37.2 C) 98.8 F (37.1 C) 98.4 F (36.9 C) 98.7 F (37.1 C)  TempSrc: Oral Oral Oral Oral  SpO2: 100% 100% 100% 100%  Weight:      Height:        Intake/Output Summary (Last 24 hours) at 01/14/16 1423 Last data filed at 01/14/16 0558  Gross per 24 hour  Intake              720 ml  Output             2025 ml  Net            -1305 ml   Filed Weights   01/08/16 0301  Weight: 81 kg (178 lb 9.6 oz)     Physical  Examination:  General exam: Appears calm and comfortable. Respiratory system: Clear to auscultation. Respiratory effort normal. Cardiovascular system:  RRR. No  murmurs, rubs, gallops. No pedal edema. GI system: Abdomen is nondistended, soft and nontender. No organomegaly.  Musculo skeletal- left foot in dressing Psychiatry: Alert, oriented x 3.Judgement and insight appear normal. Affect normal.    Data Reviewed: I have personally reviewed following labs and imaging studies  CBG: No results for input(s): GLUCAP in the last 168 hours.  CBC:  Recent Labs Lab 01/10/16 0933 01/11/16 0441 01/12/16 0202 01/13/16 0441 01/14/16 0230  WBC 6.5 7.3 8.5 7.1 6.6  NEUTROABS 3.7 4.0 4.8 3.7 3.5  HGB 11.7* 11.8* 11.5* 11.8* 11.7*  HCT 37.0* 36.7* 36.4* 37.3* 36.1*  MCV 78.7 78.1 78.1 77.5* 78.0  PLT 200 216 208 224 203    Basic Metabolic Panel:  Recent Labs Lab 01/10/16 0933 01/11/16 0441 01/12/16 0202 01/13/16 0441 01/14/16 0230  NA 139 137 137 137 136  K 4.0 3.8 4.1 4.1 4.4  CL 105 99* 99* 97* 102  CO2 26 29 30 26 29   GLUCOSE 102* 96 119* 93 102*  BUN 14 9 12 15 15   CREATININE 0.67 0.60* 0.69 0.60* 0.72  CALCIUM 9.4 9.6 9.7 9.9 9.5    Recent Results (from the past 240 hour(s))  Culture, blood (routine x 2)     Status: None   Collection Time: 01/08/16  5:21 AM  Result Value Ref Range Status   Specimen Description BLOOD LEFT ANTECUBITAL  Final   Special Requests BOTTLES DRAWN AEROBIC AND ANAEROBIC 5CC  Final   Culture NO GROWTH 5 DAYS  Final   Report Status 01/13/2016 FINAL  Final  Culture, blood (routine x 2)     Status: None   Collection Time: 01/08/16  5:24 AM  Result Value Ref Range Status   Specimen Description BLOOD LEFT ANTECUBITAL  Final   Special Requests BOTTLES DRAWN AEROBIC AND ANAEROBIC 5CC  Final   Culture NO GROWTH 5 DAYS  Final   Report Status 01/13/2016 FINAL  Final  MRSA PCR Screening     Status: None   Collection Time: 01/08/16  2:21 PM  Result  Value Ref Range Status   MRSA by PCR NEGATIVE NEGATIVE Final    Comment:        The GeneXpert MRSA Assay (FDA approved for NASAL specimens only), is one component of a comprehensive MRSA colonization surveillance program. It is not intended to diagnose MRSA infection nor to guide or monitor treatment for MRSA infections.   Aerobic/Anaerobic Culture (surgical/deep wound)     Status: None (Preliminary result)   Collection Time: 01/10/16  8:24  AM  Result Value Ref Range Status   Specimen Description TISSUE RIGHT ANKLE  Final   Special Requests NONE  Final   Gram Stain   Final    RARE WBC PRESENT, PREDOMINANTLY MONONUCLEAR RARE GRAM NEGATIVE RODS    Culture NO GROWTH 4 DAYS  Final   Report Status PENDING  Incomplete     Liver Function Tests:  Recent Labs Lab 01/10/16 0933 01/11/16 0441 01/12/16 0202 01/13/16 0441 01/14/16 0230  AST 103* 77* 64* 67* 71*  ALT 223* 188* 168* 153* 154*  ALKPHOS 167* 155* 151* 150* 154*  BILITOT 0.6 0.8 0.7 0.6 0.5  PROT 7.5 7.8 7.6 7.6 7.7  ALBUMIN 3.8 3.6 3.7 3.7 3.5   No results for input(s): LIPASE, AMYLASE in the last 168 hours. No results for input(s): AMMONIA in the last 168 hours.  Cardiac Enzymes: No results for input(s): CKTOTAL, CKMB, CKMBINDEX, TROPONINI in the last 168 hours. BNP (last 3 results)  Recent Labs  10/22/15 1927  BNP 400.5*    ProBNP (last 3 results) No results for input(s): PROBNP in the last 8760 hours.    Studies: No results found.  Scheduled Meds: . cefTRIAXone (ROCEPHIN)  IV  2 g Intravenous Q24H  . enoxaparin (LOVENOX) injection  40 mg Subcutaneous Q24H  . feeding supplement (ENSURE ENLIVE)  237 mL Oral BID BM  . mupirocin cream   Topical Daily  . vancomycin  1,250 mg Intravenous Q8H    Continuous Infusions:   Time spent: 25 min  [facility]enter[facility],Thaer Miyoshi S   [facility] Pager [phone]. If 7PM-7AM, please contact night-coverage at [email or web address], Office  [phone]  password  TRH1 01/14/2016, 2:23 PM  LOS: 6 days

## 2016-01-15 LAB — CREATININE, SERUM
Creatinine, Ser: 0.68 mg/dL (ref 0.61–1.24)
GFR calc non Af Amer: >60 mL/min (ref >60)

## 2016-01-15 LAB — AEROBIC/ANAEROBIC CULTURE (SURGICAL/DEEP WOUND): CULTURE: NO GROWTH

## 2016-01-15 LAB — AEROBIC/ANAEROBIC CULTURE W GRAM STAIN (SURGICAL/DEEP WOUND)

## 2016-01-15 NOTE — Evaluation (Signed)
Physical Therapy Evaluation Patient Details Name: Kevin Austin MRN: [id number] DOB: Apr 18, 1991 Today's Date: 01/15/2016   History of Present Illness  pt is a 25 y/o male with pmh significant for IVDU, MRSA bacteremia with prolonged hospital stay and multiple areas of I and D, presenting with R foot/ankle pain and chest pain.  Pt s/p incision and drainage 9/14.  Clinical Impression  Pt admitted with/for R foot/ankle pain due to osteo.  Pt currently limited functionally due to the problems listed below.  (see problems list.)  Pt will benefit from PT to maximize function and safety to be able to get home safely with available assist of family.     Follow Up Recommendations No PT follow up    Equipment Recommendations  None recommended by PT    Recommendations for Other Services       Precautions / Restrictions Precautions Required Braces or Orthoses: Other Brace/Splint (PRAFO) Restrictions RLE Weight Bearing: Non weight bearing LLE Weight Bearing: Weight bearing as tolerated      Mobility  Bed Mobility Overal bed mobility: Independent                Transfers Overall transfer level: Modified independent Equipment used: Rolling walker (2 wheeled) Transfers: Sit to/from [employer] CorporationStand;Stand Pivot Transfers Sit to Stand: Modified independent (Device/Increase time) Stand pivot transfers: Supervision          Ambulation/Gait Ambulation/Gait assistance: Supervision Ambulation Distance (Feet): 50 Feet Assistive device: Rolling walker (2 wheeled) Gait Pattern/deviations: Step-to pattern Gait velocity: decr Gait velocity interpretation: Below normal speed for age/gender General Gait Details: steady "swing to " gait pattern after cues to not swing too far into the RW  Stairs            Wheelchair Mobility    Modified Rankin (Stroke Patients Only)       Balance     Sitting balance-Leahy Scale: Good       Standing balance-Leahy Scale: Fair                                Pertinent Vitals/Pain Pain Assessment: 0-10 Pain Score: 7  Pain Location: ankle Pain Descriptors / Indicators: Aching;Throbbing Pain Intervention(s): Limited activity within patient's tolerance;Monitored during session;Repositioned    Home Living Family/patient expects to be discharged to:: Private residence Living Arrangements: Parent;Other relatives (grandmother) Available Help at Discharge: Family Type of Home: Mobile home Home Access: Stairs to enter Entrance Stairs-Rails: Doctor, general practiceight;Left Entrance Stairs-Number of Steps: 3 Home Layout: One level Home Equipment: None      Prior Function Level of Independence: Independent               Hand Dominance        Extremity/Trunk Assessment   Upper Extremity Assessment: Overall WFL for tasks assessed RUE Deficits / Details: sore dorsum of hand with use of the RW         Lower Extremity Assessment: RLE deficits/detail;LLE deficits/detail RLE Deficits / Details: lifts against gravity, not formally assesses LLE Deficits / Details: functional     Communication   Communication: No difficulties  Cognition Arousal/Alertness: Awake/alert Behavior During Therapy: WFL for tasks assessed/performed Overall Cognitive Status: Within Functional Limits for tasks assessed                      General Comments      Exercises     Assessment/Plan    PT Assessment Patient needs continued PT  services  PT Problem List Decreased strength;Decreased activity tolerance;Decreased balance;Decreased knowledge of use of DME;Pain          PT Treatment Interventions DME instruction;Gait training;Stair training;Functional mobility training;Therapeutic activities;Patient/family education    PT Goals (Current goals can be found in the Care Plan section)  Acute Rehab PT Goals Patient Stated Goal: get stronger and deal with this infection PT Goal Formulation: With patient Time For Goal Achievement:  01/22/16 Potential to Achieve Goals: Good    Frequency Min 3X/week   Barriers to discharge        Co-evaluation               End of Session   Activity Tolerance: Patient tolerated treatment well Patient left: in bed;with call bell/phone within reach           Time: 1610-9604 PT Time Calculation (min) (ACUTE ONLY): 22 min   Charges:   PT Evaluation $PT Eval Moderate Complexity: 1 Procedure     PT G Codes:        Kambri Dismore, Eliseo Gum 01/15/2016, 5:14 PM 01/15/2016  [place], PT [phone]  (pager)

## 2016-01-15 NOTE — Progress Notes (Signed)
CSW spoke with pt concerning possible placement for IV antibiotics- per patient still has outstanding felony charges- pt will be unable to be placed at this time due to criminal record  RNCM aware- CSW signing off  Burna SisJenna H. Jovana Rembold, LCSWA Clinical Social Worker [phone]

## 2016-01-15 NOTE — Care Management Note (Signed)
Case Management Note  Patient Details  Name: Jolene Schimkerey Fruchter MRN: [id number] Date of Birth: [birth year]  Subjective/Objective:                    Action/Plan: Plan is to discharge patient on PO antibiotics. CM following for d/c needs.   Expected Discharge Date:                  Expected Discharge Plan:     In-House Referral:  Clinical Social Work  Discharge planning Services     Post Acute Care Choice:    Choice offered to:     DME Arranged:    DME Agency:     HH Arranged:    HH Agency:     Status of Service:  In process, will continue to follow  If discussed at Long Length of Stay Meetings, dates discussed:    Additional Comments:  Kermit BaloKelli F Griselle Rufer, RN 01/15/2016, 6:28 PM

## 2016-01-15 NOTE — Progress Notes (Addendum)
INFECTIOUS DISEASE PROGRESS NOTE  ID: Kevin Austin is a 25 y.o. male with  Principal Problem:   Osteomyelitis of right tibia (HCC) Active Problems:   IV drug abuse   MRSA (methicillin resistant staph aureus) culture positive   Right ankle pain   Microcytic anemia   Transaminitis   Hepatitis C   Anxiety  Subjective: Without complaints.  Worried about being able to go to court with his leg as it is.   Abtx:  Anti-infectives    Start     Dose/Rate Route Frequency Ordered Stop   01/12/16 1100  vancomycin (VANCOCIN) 1,250 mg in sodium chloride 0.9 % 250 mL IVPB     1,250 mg 166.7 mL/hr over 90 Minutes Intravenous Every 8 hours 01/12/16 1007     01/11/16 1900  cefTRIAXone (ROCEPHIN) 2 g in dextrose 5 % 50 mL IVPB     2 g 100 mL/hr over 30 Minutes Intravenous Every 24 hours 01/11/16 1736     01/10/16 1600  vancomycin (VANCOCIN) IVPB 1000 mg/200 mL premix  Status:  Discontinued     1,000 mg 200 mL/hr over 60 Minutes Intravenous Every 8 hours 01/10/16 1509 01/12/16 1007   01/10/16 0819  vancomycin (VANCOCIN) powder  Status:  Discontinued       As needed 01/10/16 0819 01/10/16 0852   01/10/16 0819  gentamicin (GARAMYCIN) injection  Status:  Discontinued       As needed 01/10/16 0820 01/10/16 0852   01/08/16 1000  doxycycline (VIBRA-TABS) tablet 100 mg  Status:  Discontinued    Comments:  X 4 weeks. Further continuation of the antibiotic will be decided by ID clinic     100 mg Oral 2 times daily 01/08/16 0427 01/10/16 1456      Medications:  Scheduled: . cefTRIAXone (ROCEPHIN)  IV  2 g Intravenous Q24H  . enoxaparin (LOVENOX) injection  40 mg Subcutaneous Q24H  . feeding supplement (ENSURE ENLIVE)  237 mL Oral BID BM  . mupirocin cream   Topical Daily  . vancomycin  1,250 mg Intravenous Q8H    Objective: Vital signs in last 24 hours: Temp:  [97.7 F (36.5 C)-98.3 F (36.8 C)] 97.7 F (36.5 C) (09/19 0911) Pulse Rate:  [78-101] 78 (09/19 0911) Resp:  [17-20] 17 (09/19  0911) BP: (104-126)/(69-85) 114/73 (09/19 0911) SpO2:  [98 %-100 %] 100 % (09/19 0911)   General appearance: alert, cooperative and no distress Extremities: RLE dressed/wrapped. no distal/proximal erythema.   Lab Results  Recent Labs  01/13/16 0441 01/14/16 0230 01/15/16 0431  WBC 7.1 6.6  --   HGB 11.8* 11.7*  --   HCT 37.3* 36.1*  --   NA 137 136  --   K 4.1 4.4  --   CL 97* 102  --   CO2 26 29  --   BUN 15 15  --   CREATININE 0.60* 0.72 0.68   Liver Panel  Recent Labs  01/13/16 0441 01/14/16 0230  PROT 7.6 7.7  ALBUMIN 3.7 3.5  AST 67* 71*  ALT 153* 154*  ALKPHOS 150* 154*  BILITOT 0.6 0.5   Sedimentation Rate No results for input(s): ESRSEDRATE in the last 72 hours. C-Reactive Protein No results for input(s): CRP in the last 72 hours.  Microbiology: Recent Results (from the past 240 hour(s))  Culture, blood (routine x 2)     Status: None   Collection Time: 01/08/16  5:21 AM  Result Value Ref Range Status   Specimen Description BLOOD LEFT  ANTECUBITAL  Final   Special Requests BOTTLES DRAWN AEROBIC AND ANAEROBIC 5CC  Final   Culture NO GROWTH 5 DAYS  Final   Report Status 01/13/2016 FINAL  Final  Culture, blood (routine x 2)     Status: None   Collection Time: 01/08/16  5:24 AM  Result Value Ref Range Status   Specimen Description BLOOD LEFT ANTECUBITAL  Final   Special Requests BOTTLES DRAWN AEROBIC AND ANAEROBIC 5CC  Final   Culture NO GROWTH 5 DAYS  Final   Report Status 01/13/2016 FINAL  Final  MRSA PCR Screening     Status: None   Collection Time: 01/08/16  2:21 PM  Result Value Ref Range Status   MRSA by PCR NEGATIVE NEGATIVE Final    Comment:        The GeneXpert MRSA Assay (FDA approved for NASAL specimens only), is one component of a comprehensive MRSA colonization surveillance program. It is not intended to diagnose MRSA infection nor to guide or monitor treatment for MRSA infections.   Aerobic/Anaerobic Culture (surgical/deep  wound)     Status: None   Collection Time: 01/10/16  8:24 AM  Result Value Ref Range Status   Specimen Description TISSUE RIGHT ANKLE  Final   Special Requests NONE  Final   Gram Stain   Final    RARE WBC PRESENT, PREDOMINANTLY MONONUCLEAR RARE GRAM NEGATIVE RODS    Culture NO GROWTH 5 DAYS  Final   Report Status 01/15/2016 FINAL  Final    Studies/Results: No results found.   Assessment/Plan: IVDA MRSA Bacteremia 09-2015 Osteomyelitis of TIbia (stain shows rare GNR; Cx: negative). Hepatitis C 1a Total days of antibiotics: 5 vanco/ceftriaxone  His gram  Would- D/i pharm to help set up a single dose of oritavancin on day of d/c- unable to obtain.   Start bactrim 1 ds po bid for 3 months I'd be glad to see him in clinic in 1 month.          Kevin Austin Infectious Diseases (pager) [phone] [email or web address] 01/15/2016, 1:20 PM  LOS: 7 days

## 2016-01-15 NOTE — Progress Notes (Signed)
Pharmacy Antibiotic Note  Jolene Schimkerey Vineyard is a 25 y.o. male admitted on 01/08/2016 with sepsis/osteomyelitis.  Pharmacy consulted for Vancomycin dosing.  IVDA with MRSA sepsis, osteomyelitis of R tibia, well known to pharmacy from previous dosing. Vancomycin dose increased from 1 gm q8h to 1250 q8h on 9/16. 9/14 R ankle tissue cx: no growth final.  Unable to go to SNF. ID recs single dose of oritavancin on day of discharge, then bactrim 1 DS po BID x 3 months w/ f/u in ID clinic.    Plan: Continue on Vancomycin 1250mg  IV Q 8 hours - no vanc levels as expect to DC on po abx soon Expect Rocephin to be stopped  Height: 6' (182.9 cm) Weight: 178 lb 9.6 oz (81 kg) IBW/kg (Calculated) : 77.6  Temp (24hrs), Avg:98 F (36.7 C), Min:97.7 F (36.5 C), Max:98.3 F (36.8 C)   Recent Labs Lab 01/10/16 0933 01/11/16 0441 01/12/16 0202 01/12/16 0858 01/13/16 0441 01/14/16 0230 01/15/16 0431  WBC 6.5 7.3 8.5  --  7.1 6.6  --   CREATININE 0.67 0.60* 0.69  --  0.60* 0.72 0.68  VANCOTROUGH  --   --   --  13*  --   --   --     Estimated Creatinine Clearance: 157.6 mL/min (by C-G formula based on SCr of 0.68 mg/dL).    Allergies  Allergen Reactions  . [facility]    [facility]  . Tramadol Nausea Only    [facility]    Antimicrobials this admission:  Vanc 9/14 >> Rocephin 9/15>> Doxy po  9/13>9/14 (on pta)  Last admission:  8/5 VT = 21 on 1250mg  q8h 8/9 VT =16 on 1000mg  q8h 8/15 VT =14 on 1000 mg q8h 8/19 VT =21 on 1250 mg q8h  Dose adjustments this admission:  9/16 VT 13>>increase to 1.25g IV q8  Microbiology results:  9/14 tissue right ankle: rare GNR, NGFinal 9/12 BCx: ngF 9/12 MRSA PCR: negative 9/14 HIV neg  Herby AbrahamMichelle T. Petrona Wyeth, Pharm.D. [phone] 01/15/2016 1:25 PM

## 2016-01-15 NOTE — Progress Notes (Signed)
[facility]  PROGRESS NOTE  Kevin Austin ZOX:[id number] DOB: 07/15/1990 DOA: 01/08/2016 PCP: No primary care provider on file.   Brief HPI:  25 yo male with history of IVDU and recent MRSA bacteremia and complications who presented to [facility] ED 9/11 with worsening right foot pain and swelling. He had been at home for 3 weeks following prolonged hospitalization which included multiple debridements of abscesses for septic emboli, including the right ankle by Dr. Eulah Pont on 7/6. He had been taking doxycycline as directed but unfortunately had to reschedule several follow up appointments and so he hasn't followed up. He noted worsening pain when weight bearing radiating up the leg with swelling of the right ankle and intermittent fevers. Doppler U/S ruled out DVT. CT of the right ankle demonstrated periosteal reaction of the distal tibia consistent with osteomyelitis. He was transferred to [facility].   MRI was obtained which showed acute osteomyelitis of right tibia. Patient was initially started on doxycycline. Orthopedic surgery was consulted and performed an I&D on 9/14. Infectious disease was also consulted who started vancomycin and then ceftriaxone as well.  .   Subjective    Patient seen and examined, denies pain in the foot at this time. Physical therapy to evaluate patient today.    Assessment/Plan:     1. Acute right tibia osteomyelitis- status post incision and drainage 01/10/2016, weightbearing as tolerated. Follow-up orthopedics as outpatient. Continue IV antibiotics.Patient will need a PICC line if PT recommends that he should go to rehabilitation. If patient goes home he'll be discharged on by mouth antibiotics 2. Sternal wound status post debridement- wound care consulted. Bactroban to promote moist healing, foam dressing to protect from further injury. 3. Transaminitis- liver enzymes slowly improving, hep C antibodies positive, HIV negative. Will  follow-up GI as  outpatient. 4. Chronic anemia- hemoglobin stays around 9-10, today hemoglobin is 11.7. 5. History of IV drug abuse- patient cannot be discharged home on PICC line.      DVT prophylaxis: Lovenox  Code Status: Full code  Family Communication: No family at bedside   Disposition Plan: Pending PT evaluation   Consultants:  Infection disease  Orthopedics  Procedures:  Incision and drainage Dr. Eulah Pont on 01/10/2016  Antibiotics:   Anti-infectives    Start     Dose/Rate Route Frequency Ordered Stop   01/12/16 1100  vancomycin (VANCOCIN) 1,250 mg in sodium chloride 0.9 % 250 mL IVPB     1,250 mg 166.7 mL/hr over 90 Minutes Intravenous Every 8 hours 01/12/16 1007     01/11/16 1900  cefTRIAXone (ROCEPHIN) 2 g in dextrose 5 % 50 mL IVPB     2 g 100 mL/hr over 30 Minutes Intravenous Every 24 hours 01/11/16 1736     01/10/16 1600  vancomycin (VANCOCIN) IVPB 1000 mg/200 mL premix  Status:  Discontinued     1,000 mg 200 mL/hr over 60 Minutes Intravenous Every 8 hours 01/10/16 1509 01/12/16 1007   01/10/16 0819  vancomycin (VANCOCIN) powder  Status:  Discontinued       As needed 01/10/16 0819 01/10/16 0852   01/10/16 0819  gentamicin (GARAMYCIN) injection  Status:  Discontinued       As needed 01/10/16 0820 01/10/16 0852   01/08/16 1000  doxycycline (VIBRA-TABS) tablet 100 mg  Status:  Discontinued    Comments:  X 4 weeks. Further continuation of the antibiotic will be decided by ID clinic     100 mg Oral 2 times daily 01/08/16 0427 01/10/16 1456  Objective   Vitals:   01/14/16 2114 01/15/16 0106 01/15/16 0611 01/15/16 0911  BP: 109/76 112/80 104/69 114/73  Pulse: (!) 101 99 84 78  Resp: 20 20 20 17   Temp: 98.2 F (36.8 C) 98.1 F (36.7 C) 98.1 F (36.7 C) 97.7 F (36.5 C)  TempSrc: Oral Oral Oral Oral  SpO2: 100% 100% 99% 100%  Weight:      Height:        Intake/Output Summary (Last 24 hours) at 01/15/16 1113 Last data filed at 01/15/16 0900  Gross per 24  hour  Intake              240 ml  Output             1050 ml  Net             -810 ml   Filed Weights   01/08/16 0301  Weight: 81 kg (178 lb 9.6 oz)     Physical Examination:  General exam: Appears calm and comfortable. Respiratory system: Clear to auscultation. Respiratory effort normal. Cardiovascular system:  RRR. No  murmurs, rubs, gallops. No pedal edema. GI system: Abdomen is nondistended, soft and nontender. No organomegaly.  Musculo skeletal- left foot in dressing Psychiatry: Alert, oriented x 3.Judgement and insight appear normal. Affect normal.    Data Reviewed: I have personally reviewed following labs and imaging studies  CBG: No results for input(s): GLUCAP in the last 168 hours.  CBC:  Recent Labs Lab 01/10/16 0933 01/11/16 0441 01/12/16 0202 01/13/16 0441 01/14/16 0230  WBC 6.5 7.3 8.5 7.1 6.6  NEUTROABS 3.7 4.0 4.8 3.7 3.5  HGB 11.7* 11.8* 11.5* 11.8* 11.7*  HCT 37.0* 36.7* 36.4* 37.3* 36.1*  MCV 78.7 78.1 78.1 77.5* 78.0  PLT 200 216 208 224 203    Basic Metabolic Panel:  Recent Labs Lab 01/10/16 0933 01/11/16 0441 01/12/16 0202 01/13/16 0441 01/14/16 0230 01/15/16 0431  NA 139 137 137 137 136  --   K 4.0 3.8 4.1 4.1 4.4  --   CL 105 99* 99* 97* 102  --   CO2 26 29 30 26 29   --   GLUCOSE 102* 96 119* 93 102*  --   BUN 14 9 12 15 15   --   CREATININE 0.67 0.60* 0.69 0.60* 0.72 0.68  CALCIUM 9.4 9.6 9.7 9.9 9.5  --     Recent Results (from the past 240 hour(s))  Culture, blood (routine x 2)     Status: None   Collection Time: 01/08/16  5:21 AM  Result Value Ref Range Status   Specimen Description BLOOD LEFT ANTECUBITAL  Final   Special Requests BOTTLES DRAWN AEROBIC AND ANAEROBIC 5CC  Final   Culture NO GROWTH 5 DAYS  Final   Report Status 01/13/2016 FINAL  Final  Culture, blood (routine x 2)     Status: None   Collection Time: 01/08/16  5:24 AM  Result Value Ref Range Status   Specimen Description BLOOD LEFT ANTECUBITAL  Final    Special Requests BOTTLES DRAWN AEROBIC AND ANAEROBIC 5CC  Final   Culture NO GROWTH 5 DAYS  Final   Report Status 01/13/2016 FINAL  Final  MRSA PCR Screening     Status: None   Collection Time: 01/08/16  2:21 PM  Result Value Ref Range Status   MRSA by PCR NEGATIVE NEGATIVE Final    Comment:        The GeneXpert MRSA Assay (FDA approved for NASAL specimens only),  is one component of a comprehensive MRSA colonization surveillance program. It is not intended to diagnose MRSA infection nor to guide or monitor treatment for MRSA infections.   Aerobic/Anaerobic Culture (surgical/deep wound)     Status: None   Collection Time: 01/10/16  8:24 AM  Result Value Ref Range Status   Specimen Description TISSUE RIGHT ANKLE  Final   Special Requests NONE  Final   Gram Stain   Final    RARE WBC PRESENT, PREDOMINANTLY MONONUCLEAR RARE GRAM NEGATIVE RODS    Culture NO GROWTH 5 DAYS  Final   Report Status 01/15/2016 FINAL  Final     Liver Function Tests:  Recent Labs Lab 01/10/16 0933 01/11/16 0441 01/12/16 0202 01/13/16 0441 01/14/16 0230  AST 103* 77* 64* 67* 71*  ALT 223* 188* 168* 153* 154*  ALKPHOS 167* 155* 151* 150* 154*  BILITOT 0.6 0.8 0.7 0.6 0.5  PROT 7.5 7.8 7.6 7.6 7.7  ALBUMIN 3.8 3.6 3.7 3.7 3.5   No results for input(s): LIPASE, AMYLASE in the last 168 hours. No results for input(s): AMMONIA in the last 168 hours.  Cardiac Enzymes: No results for input(s): CKTOTAL, CKMB, CKMBINDEX, TROPONINI in the last 168 hours. BNP (last 3 results)  Recent Labs  10/22/15 1927  BNP 400.5*    ProBNP (last 3 results) No results for input(s): PROBNP in the last 8760 hours.    Studies: No results found.  Scheduled Meds: . cefTRIAXone (ROCEPHIN)  IV  2 g Intravenous Q24H  . enoxaparin (LOVENOX) injection  40 mg Subcutaneous Q24H  . feeding supplement (ENSURE ENLIVE)  237 mL Oral BID BM  . mupirocin cream   Topical Daily  . vancomycin  1,250 mg Intravenous Q8H     Continuous Infusions:   Time spent: 25 min  [facility],Arash Karstens S   [facility] Pager [phone]. If 7PM-7AM, please contact night-coverage at [email or web address], Office  [phone]  password [email or web address] 01/15/2016, 11:13 AM  LOS: 7 days

## 2016-01-16 MED ORDER — SULFAMETHOXAZOLE-TRIMETHOPRIM 800-160 MG PO TABS: 1.0000 | ORAL_TABLET | Freq: Two times a day (BID) | ORAL | 2 refills | Status: DC

## 2016-01-16 MED ORDER — POLYETHYLENE GLYCOL 3350 17 G PO PACK: 17.0000 g | PACK | Freq: Every day | ORAL | 0 refills | Status: AC | PRN

## 2016-01-16 MED ORDER — TRAMADOL HCL 50 MG PO TABS: 50.0000 mg | ORAL_TABLET | Freq: Four times a day (QID) | ORAL | 0 refills | Status: AC | PRN
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
## 2016-01-16 NOTE — Progress Notes (Signed)
Orthopedic Tech Progress Note Patient Details:  Kevin Austin 1991-01-13 [id number]  Ortho Devices Type of Ortho Device: Crutches Ortho Device/Splint Interventions: Application   Nikki DomCrawford, Nyrie Sigal 01/16/2016, 4:48 PM
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
## 2016-01-16 NOTE — Progress Notes (Signed)
Explain discharge for home care MD appointments and wound care, HH dsg changed on sternum and right foot before discharge as ordered and clutches ordered

## 2016-01-16 NOTE — Progress Notes (Signed)
Physical Therapy Treatment Patient Details Name: Jolene Schimkerey Haynie MRN: [id number] DOB: 1990/04/30 Today's Date: 01/16/2016    History of Present Illness pt is a 25 y/o male with pmh significant for IVDU, MRSA bacteremia with prolonged hospital stay and multiple areas of I and D, presenting with R foot/ankle pain and chest pain.  Pt s/p incision and drainage 9/14.    PT Comments    Progressing well with gait at NWB on Right.  Completed steps with crutches and completed general crutch education.  Follow Up Recommendations  No PT follow up     Equipment Recommendations  Crutches;Other (comment) (up to 5' 10 inches (shorter), Hand bars 2nd to bottom holes)    Recommendations for Other Services       Precautions / Restrictions Restrictions Weight Bearing Restrictions: Yes RLE Weight Bearing: Non weight bearing LLE Weight Bearing: Weight bearing as tolerated    Mobility  Bed Mobility Overal bed mobility: Independent                Transfers Overall transfer level: Modified independent Equipment used: Crutches Transfers: Sit to/from Stand Sit to Stand: Modified independent (Device/Increase time)         General transfer comment: more cues and support from UE's and bed needed to balance and get crutches under axilla  Ambulation/Gait Ambulation/Gait assistance: Supervision Ambulation Distance (Feet): 130 Feet Assistive device: Crutches Gait Pattern/deviations: Step-through pattern Gait velocity: decr Gait velocity interpretation: Below normal speed for age/gender General Gait Details: generally steady swing through gait pattern, but on occasion he swung through too far.   Stairs Stairs: Yes   Stair Management: No rails;With crutches;Step to pattern;Forwards Number of Stairs: 8 General stair comments: needed cues at times and min assist coming downdue to over-rotation.  Wheelchair Mobility    Modified Rankin (Stroke Patients Only)       Balance Overall  balance assessment: Needs assistance Sitting-balance support: No upper extremity supported Sitting balance-Leahy Scale: Good     Standing balance support: No upper extremity supported;Single extremity supported Standing balance-Leahy Scale: Fair Standing balance comment: crutches add a balance challenge                    Cognition Arousal/Alertness: Awake/alert Behavior During Therapy: WFL for tasks assessed/performed Overall Cognitive Status: Within Functional Limits for tasks assessed                      Exercises      General Comments        Pertinent Vitals/Pain Pain Assessment: 0-10 Pain Score: 7  Pain Location: ankle Pain Descriptors / Indicators: Aching Pain Intervention(s): Monitored during session;Repositioned    Home Living                      Prior Function            PT Goals (current goals can now be found in the care plan section) Acute Rehab PT Goals Patient Stated Goal: get stronger and deal with this infection PT Goal Formulation: With patient Time For Goal Achievement: 01/22/16 Potential to Achieve Goals: Good Progress towards PT goals: Progressing toward goals    Frequency    Min 3X/week      PT Plan Current plan remains appropriate    Co-evaluation             End of Session   Activity Tolerance: Patient tolerated treatment well Patient left: in bed;with call bell/phone within reach  Time: 4098-1191 PT Time Calculation (min) (ACUTE ONLY): 23 min  Charges:  $Gait Training: 8-22 mins $Therapeutic Activity: 8-22 mins                    G Codes:      Chaney Maclaren, Eliseo Gum 01/16/2016, 2:31 PM

## 2016-01-22 ENCOUNTER — Inpatient Hospital Stay: Payer: Self-pay | Admitting: Internal Medicine

## 2016-01-24 ENCOUNTER — Telehealth: Payer: Self-pay

## 2016-01-24 NOTE — Telephone Encounter (Signed)
Patient's Mother is calling to say her son missed his appointment dure to lack of transportation.  He is also having problems with the leg which he recently had surgery. She is concerned no one has looked at the leg since surgery.  Patient  is advised to seek care from the surgeon as soon as possible.  Reschedule office visit with ID physician to follow up for HIV.  Laurell Josephsammy K Stephana Morell, RN

## 2016-02-26 ENCOUNTER — Telehealth: Payer: Self-pay | Admitting: *Deleted

## 2016-02-26 NOTE — Telephone Encounter (Signed)
[facility] from [facility], attempting to reschedule [facility] [place] follow up appointment.  He has missed 2 [facility] due to lack of transportation - last appointment was with Dr Orvan Falconerampbell.  Kevin [place] is still on bactrim. RN rescheduled Kevin [place] for 11/2 at 2:30.  [facility] will notify [facility] [place]. Andree CossHowell, Ainhoa Rallo M, RN

## 2016-02-28 ENCOUNTER — Ambulatory Visit (INDEPENDENT_AMBULATORY_CARE_PROVIDER_SITE_OTHER): Payer: Self-pay | Admitting: Internal Medicine

## 2016-02-28 ENCOUNTER — Encounter: Payer: Self-pay | Admitting: Internal Medicine

## 2016-02-28 DIAGNOSIS — R7881 Bacteremia: Secondary | ICD-10-CM

## 2016-02-28 DIAGNOSIS — M869 Osteomyelitis, unspecified: Secondary | ICD-10-CM

## 2016-02-28 DIAGNOSIS — Z23 Encounter for immunization: Secondary | ICD-10-CM

## 2016-02-28 NOTE — Assessment & Plan Note (Signed)
His sternal wound is healing slowly. I suspect his osteomyelitis has been cured. I encouraged him to follow-up with his surgeon. He will follow-up here in one month.

## 2016-02-28 NOTE — Assessment & Plan Note (Signed)
His bacteremia has long been cured.

## 2016-02-28 NOTE — Assessment & Plan Note (Signed)
His inflammatory markers were normal when he was hospitalized over 6 weeks ago. He is now received over 4 months of continuous antibiotic therapy including 6 weeks since his last surgery. I am hopeful that his infection has been cured and will stop trimethoprim sulfamethoxazole now.

## 2016-02-28 NOTE — Progress Notes (Addendum)
[facility]  Patient Active Problem List   Diagnosis Date Noted  . MRSA bacteremia 10/29/2015    Priority: High  . Osteomyelitis of right tibia (HCC) 01/08/2016    Priority: Medium  . Septic arthritis of right ankle (HCC) 11/08/2015    Priority: Medium  . Sacral osteomyelitis (HCC) 11/08/2015    Priority: Medium  . Septic arthritis of hip (HCC) 11/07/2015    Priority: Medium  . Sternal osteomyelitis (HCC)     Priority: Medium  . Hepatitis C 01/09/2016  . Anxiety 01/09/2016  . Microcytic anemia 01/08/2016  . IV drug abuse 11/08/2015    Patient's Medications  New Prescriptions   No medications on file  Previous Medications   ACETAMINOPHEN (TYLENOL) 325 MG TABLET    Take 650 mg by mouth every 6 (six) hours as needed for mild pain.   FEEDING SUPPLEMENT, ENSURE ENLIVE, (ENSURE ENLIVE) LIQD    Take 237 mLs by mouth 2 (two) times daily between meals.   LORAZEPAM (ATIVAN) 0.5 MG TABLET    Take 1 tablet (0.5 mg total) by mouth daily as needed for anxiety.   MULTIPLE VITAMIN (MULTIVITAMIN WITH MINERALS) TABS TABLET    Take 1 tablet by mouth daily.   POLYETHYLENE GLYCOL (MIRALAX / GLYCOLAX) PACKET    Take 17 g by mouth daily as needed for mild constipation or moderate constipation.   TRAMADOL (ULTRAM) 50 MG TABLET    Take 1 tablet (50 mg total) by mouth every 6 (six) hours as needed for moderate pain.  Modified Medications   No medications on file  Discontinued Medications   SULFAMETHOXAZOLE-TRIMETHOPRIM (BACTRIM DS,SEPTRA DS) 800-160 MG TABLET    Take 1 tablet by mouth 2 (two) times daily.    Subjective: [place] is in for his hospital follow-up visit. He has a history of injecting drug use and developed MRSA bacteremia in late June. His infection was widely disseminated involved pneumonia, bilateral hand infection left foot infection right ankle infection sternal osteomyelitis with phlegmon and septic hip. He underwent multiple debridements.  Repeat blood cultures became negative and TEE showed no evidence of endocarditis. He was treated with vancomycin initially and then transitioned to Ceftin or lean then back to vancomycin. He was discharged on 12/18/2015 on oral doxycycline. He was readmitted on 01/08/2016 with increased right ankle pain. He was placed back on vancomycin. He underwent right ankle IND. Operative Gram stain and cultures were negative. Blood cultures were negative at that time. He was discharged on 01/16/2016. He received 1 dose of long-acting oritavancin prior to discharge. He was placed on double strength trimethoprim sulfamethoxazole twice daily which he continues to take. He has been on continuous antibiotic therapy for a little over 4 months. He has some pain and stiffness in his right hand right ankle and left fifth toe. His sternal wound is still open but healing slowly. He states that he has had low-grade fevers up to 99. He hopes to be out of jail by early next week.  Review of Systems: Review of Systems  Constitutional: Positive for malaise/fatigue. Negative for chills, diaphoresis, fever and weight loss.  Respiratory: Negative for cough, sputum production and shortness of breath.   Cardiovascular: Positive for chest pain.       He has some mild pain around his wound.  Gastrointestinal: Negative for abdominal pain, diarrhea, nausea and vomiting.  Genitourinary: Negative for dysuria.  Musculoskeletal: Positive for joint pain and myalgias.  Skin: Negative  for rash.  Neurological: Negative for dizziness and headaches.  Psychiatric/Behavioral: Negative for substance abuse. The patient is not nervous/anxious.     Past Medical History:  Diagnosis Date  . Abscess of sternal region   . Acute osteomyelitis of pelvic region (HCC)   . MRSA bacteremia 09/2014  . Osteomyelitis of right tibia (HCC)   . Sacral osteomyelitis (HCC)   . Septic arthritis of ankle or foot, right   . Septic pulmonary embolism [facility](HCC)      Social History  Substance Use Topics  . Smoking status: Current Every Day Smoker    Packs/day: 0.50    Types: Cigarettes  . Smokeless tobacco: Never Used  . Alcohol use No     Comment: incarcerated    No family history on file.  Allergies  Allergen Reactions  . Ketorolac Nausea Only    [facility]  . Tramadol Nausea Only    Per [facility]    Objective: Vitals:   02/28/16 1446  BP: 126/86  Pulse: (!) 109  Temp: 97.8 F (36.6 C)  TempSrc: Oral   There is no height or weight on file to calculate BMI.  Physical Exam  Constitutional: He is oriented to person, place, and time.  He is in no distress sitting up on the exam table. He is accompanied by 2 police officers. His ankles and wrists are shackled.  HENT:  Mouth/Throat: No oropharyngeal exudate.  Cardiovascular: Regular rhythm.   No murmur heard. Pulmonary/Chest: Effort normal and breath sounds normal. He has no wheezes. He has no rales.  Abdominal: Soft. There is no tenderness.  Musculoskeletal: He exhibits tenderness. He exhibits no edema.   Left foot and right ankle. His sternal wound is open with good red granulation tissue.  Neurological: He is oriented to person, place, and time.  Skin: No rash noted.  Facial acne.  Psychiatric: Mood and affect normal.    Lab Results    Problem List Items Addressed This Visit      High   MRSA bacteremia    His bacteremia has long been cured.        Medium   Sternal osteomyelitis (HCC)    His sternal wound is healing slowly. I suspect his osteomyelitis has been cured. I encouraged him to follow-up with his surgeon. He will follow-up here in one month.       Other Visit Diagnoses   None.   His inflammatory markers were normal when he was hospitalized over 6 weeks ago. He is now received over 4 months of continuous antibiotic therapy including 6 weeks since his last surgery. I am hopeful that his infection has been cured and will stop  trimethoprim sulfamethoxazole now.   Kevin AstersJohn Sonya Gunnoe, MD [facility] [phone] pager   [phone] cell 02/28/2016, 3:33 PM

## 2016-03-27 ENCOUNTER — Encounter: Payer: Self-pay | Admitting: Internal Medicine

## 2016-03-27 ENCOUNTER — Ambulatory Visit (INDEPENDENT_AMBULATORY_CARE_PROVIDER_SITE_OTHER): Payer: Self-pay | Admitting: Internal Medicine

## 2016-03-27 DIAGNOSIS — M869 Osteomyelitis, unspecified: Secondary | ICD-10-CM

## 2016-03-27 DIAGNOSIS — B182 Chronic viral hepatitis C: Secondary | ICD-10-CM

## 2016-03-27 DIAGNOSIS — R7881 Bacteremia: Secondary | ICD-10-CM

## 2016-03-27 DIAGNOSIS — F1910 Other psychoactive substance abuse, uncomplicated: Secondary | ICD-10-CM

## 2016-03-27 DIAGNOSIS — M00071 Staphylococcal arthritis, right ankle and foot: Secondary | ICD-10-CM

## 2016-03-27 NOTE — Assessment & Plan Note (Signed)
He has hepatitis C genotype 1A with a viral load of 36,000. I told him that he would be a candidate for therapy once he is out of jail and drug free for at least 6 months.

## 2016-03-27 NOTE — Assessment & Plan Note (Signed)
I do not see any evidence of active infection but he is having slow wound healing. I will see if we can arrange for him to be seen back by Dr. Dorris FetchHendrickson.

## 2016-03-27 NOTE — Assessment & Plan Note (Signed)
He tells me that he will never used intravenous drugs again. I talked him about the importance of seeking drug treatment once he is out of jail.

## 2016-03-27 NOTE — Assessment & Plan Note (Signed)
I think his MRSA bacteremia has been cured. His TEE in June did not show any evidence of endocarditis but I'm concerned that he may have developed mitral valve endocarditis over his prolonged course given the soft murmur that I hear today. Unfortunately he is not insured making it difficult to get a follow-up echocardiogram. I will have him continue off of antibiotics and follow when necessary 2 months.

## 2016-03-27 NOTE — Progress Notes (Signed)
[facility]  Patient Active Problem List   Diagnosis Date Noted  . MRSA bacteremia 10/29/2015    Priority: High  . Osteomyelitis of right tibia (HCC) 01/08/2016    Priority: Medium  . Septic arthritis of right ankle (HCC) 11/08/2015    Priority: Medium  . Sacral osteomyelitis (HCC) 11/08/2015    Priority: Medium  . Septic arthritis of hip (HCC) 11/07/2015    Priority: Medium  . Sternal osteomyelitis (HCC)     Priority: Medium  . Hepatitis C 01/09/2016  . Anxiety 01/09/2016  . Microcytic anemia 01/08/2016  . IV drug abuse 11/08/2015    Patient's Medications  New Prescriptions   No medications on file  Previous Medications   ACETAMINOPHEN (TYLENOL) 325 MG TABLET    Take 650 mg by mouth every 6 (six) hours as needed for mild pain.   FEEDING SUPPLEMENT, ENSURE ENLIVE, (ENSURE ENLIVE) LIQD    Take 237 mLs by mouth 2 (two) times daily between meals.   LORAZEPAM (ATIVAN) 0.5 MG TABLET    Take 1 tablet (0.5 mg total) by mouth daily as needed for anxiety.   MULTIPLE VITAMIN (MULTIVITAMIN WITH MINERALS) TABS TABLET    Take 1 tablet by mouth daily.   POLYETHYLENE GLYCOL (MIRALAX / GLYCOLAX) PACKET    Take 17 g by mouth daily as needed for mild constipation or moderate constipation.   TRAMADOL (ULTRAM) 50 MG TABLET    Take 1 tablet (50 mg total) by mouth every 6 (six) hours as needed for moderate pain.  Modified Medications   No medications on file  Discontinued Medications   No medications on file    Subjective: [place] is in for his routine follow-up visit. He has a history of injecting drug use and developed MRSA bacteremia in late June. His infection was widely disseminated involved pneumonia, bilateral hand infection left foot infection right ankle infection sternal osteomyelitis with phlegmon and septic hip. He underwent multiple debridements. Repeat blood cultures became negative and TEE showed no evidence of endocarditis. He was treated with  vancomycin initially and then transitioned to Ceftin or lean then back to vancomycin. He was discharged on 12/18/2015 on oral doxycycline. He was readmitted on 01/08/2016 with increased right ankle pain. He was placed back on vancomycin. He underwent right ankle IND. Operative Gram stain and cultures were negative. Blood cultures were negative at that time. He was discharged on 01/16/2016. He received 1 dose of long-acting oritavancin prior to discharge. He was placed on double strength trimethoprim sulfamethoxazole twice daily which he continues to take. He completed 4 months of continuous antibiotic therapy 4 weeks ago. He has not had any fever, chills or sweats recently. He does have some soreness in his left chest. He also notes swelling in his right ankle after he has been standing for a while. He has pain and stiffness in his right ankle and hands. He is concerned that his sternal dressing has not been changed. He remains in jail and has his next court date on 03/31/2016.  Review of Systems: Review of Systems  Constitutional: Positive for malaise/fatigue. Negative for chills, diaphoresis, fever and weight loss.  Respiratory: Negative for cough, sputum production and shortness of breath.   Cardiovascular: Positive for chest pain.       He has some mild pain around his wound.  Gastrointestinal: Negative for abdominal pain, diarrhea, heartburn, nausea and vomiting.  Genitourinary: Negative for dysuria.  Musculoskeletal: Positive for joint pain.  Negative for myalgias.  Skin: Negative for rash.  Neurological: Negative for dizziness and headaches.  Psychiatric/Behavioral: Negative for depression and substance abuse. The patient is not nervous/anxious.     Past Medical History:  Diagnosis Date  . Abscess of sternal region   . Acute osteomyelitis of pelvic region (HCC)   . MRSA bacteremia 09/2014  . Osteomyelitis of right tibia (HCC)   . Sacral osteomyelitis (HCC)   . Septic arthritis of ankle  or foot, right   . Septic pulmonary embolism [facility])     Social History  Substance Use Topics  . Smoking status: Current Every Day Smoker    Packs/day: 0.50    Types: Cigarettes  . Smokeless tobacco: Never Used     Comment: has not smoked in the past month, hopes he can continue once he is released  . Alcohol use No     Comment: incarcerated    No family history on file.  Allergies  Allergen Reactions  . Ketorolac Nausea Only    Per [facility] records  . Tramadol Nausea Only    Per [facility] records    Objective: Vitals:   03/27/16 1513  BP: 124/87  Pulse: (!) 101  Temp: 98.1 F (36.7 C)  TempSrc: Oral   There is no height or weight on file to calculate BMI.  Physical Exam  Constitutional: He is oriented to person, place, and time.  He is in good spirits. He is accompanied by a Psychologist, prison and probation services. His ankles and wrists are shackled.  HENT:  Mouth/Throat: No oropharyngeal exudate.  Cardiovascular: Normal rate and regular rhythm.   Murmur heard. He has a soft, 1 to 2/6 systolic murmur heard best in the left axillary line.  Pulmonary/Chest: Effort normal and breath sounds normal. He has no wheezes. He has no rales.  Abdominal: Soft. There is no tenderness.  Musculoskeletal: He exhibits tenderness. He exhibits no edema.  He has some restricted range of motion of his right ankle. His sternal wound is open with good epithelialization. Still has some central scabbing.  Neurological: He is oriented to person, place, and time.  Skin: No rash noted.  Facial acne.  Psychiatric: Mood and affect normal.    Lab Results    Problem List Items Addressed This Visit      High   MRSA bacteremia    I think his MRSA bacteremia has been cured. His TEE in June did not show any evidence of endocarditis but I'm concerned that he may have developed mitral valve endocarditis over his prolonged course given the soft murmur that I hear today. Unfortunately he is not insured making it difficult  to get a follow-up echocardiogram. I will have him continue off of antibiotics and follow when necessary 2 months.        Medium   Septic arthritis of right ankle (HCC)    I believe that his septic arthritis has been cured. I told him that he will probably always have some stiffness and restricted range of motion. He also has some degree of venous insufficiency causing deep tendon edema.      Sternal osteomyelitis (HCC)    I do not see any evidence of active infection but he is having slow wound healing. I will see if we can arrange for him to be seen back by Dr. Dorris Fetch.        Unprioritized   Hepatitis C (Chronic)    He has hepatitis C genotype 1A with a viral load of 36,000. I  told him that he would be a candidate for therapy once he is out of jail and drug free for at least 6 months.      IV drug abuse    He tells me that he will never used intravenous drugs again. I talked him about the importance of seeking drug treatment once he is out of jail.       His inflammatory markers were normal when he was hospitalized over 6 weeks ago. He is now received over 4 months of continuous antibiotic therapy including 6 weeks since his last surgery. I am hopeful that his infection has been cured and will stop trimethoprim sulfamethoxazole now.   Kevin AstersJohn Deklin Bieler, MD [facility] [phone] pager   [phone] cell 03/27/2016, 3:43 PM

## 2016-03-27 NOTE — Assessment & Plan Note (Signed)
I believe that his septic arthritis has been cured. I told him that he will probably always have some stiffness and restricted range of motion. He also has some degree of venous insufficiency causing deep tendon edema.

## 2016-04-02 ENCOUNTER — Ambulatory Visit: Payer: Self-pay | Admitting: Thoracic Surgery (Cardiothoracic Vascular Surgery)

## 2016-04-08 ENCOUNTER — Other Ambulatory Visit: Payer: Self-pay | Admitting: *Deleted

## 2016-04-08 ENCOUNTER — Ambulatory Visit: Payer: Self-pay | Admitting: Thoracic Surgery (Cardiothoracic Vascular Surgery)

## 2016-04-29 ENCOUNTER — Telehealth: Payer: Self-pay | Admitting: *Deleted

## 2016-04-29 NOTE — Telephone Encounter (Signed)
Patient called and asked if Dr Orvan Falconerampbell could refer him to a doctor for Hep C in [place]. Advised him not sure how that works as we work from referrals, but will ask if he knows of a doctor in the area and if he will make the referral and give him a call back.

## 2016-04-29 NOTE — Telephone Encounter (Signed)
Please tell Mr. Jamelle HaringSnow that I am not aware of any physicians in [place] who are currently treating hepatitis C.

## 2016-05-29 ENCOUNTER — Ambulatory Visit: Payer: Self-pay | Admitting: Internal Medicine

## 2016-06-04 ENCOUNTER — Telehealth: Payer: Self-pay | Admitting: *Deleted

## 2016-06-04 NOTE — Telephone Encounter (Signed)
Patient called back stating he found an office in [place] that treats Hep C and all they needed was the last office note and related labs from Dr. Orvan Falconerampbell. The office is [facility] and he will sign a release there and have it faxed to this office. The notes can then be faxed.

## 2016-11-11 IMAGING — CR DG CHEST 1V PORT
1 series · 1 of 1 positions shown · non-contrast
Comparison: 11/20/2015

CLINICAL DATA: Shortness of Breath

EXAM:
PORTABLE CHEST 1 VIEW

[portable]
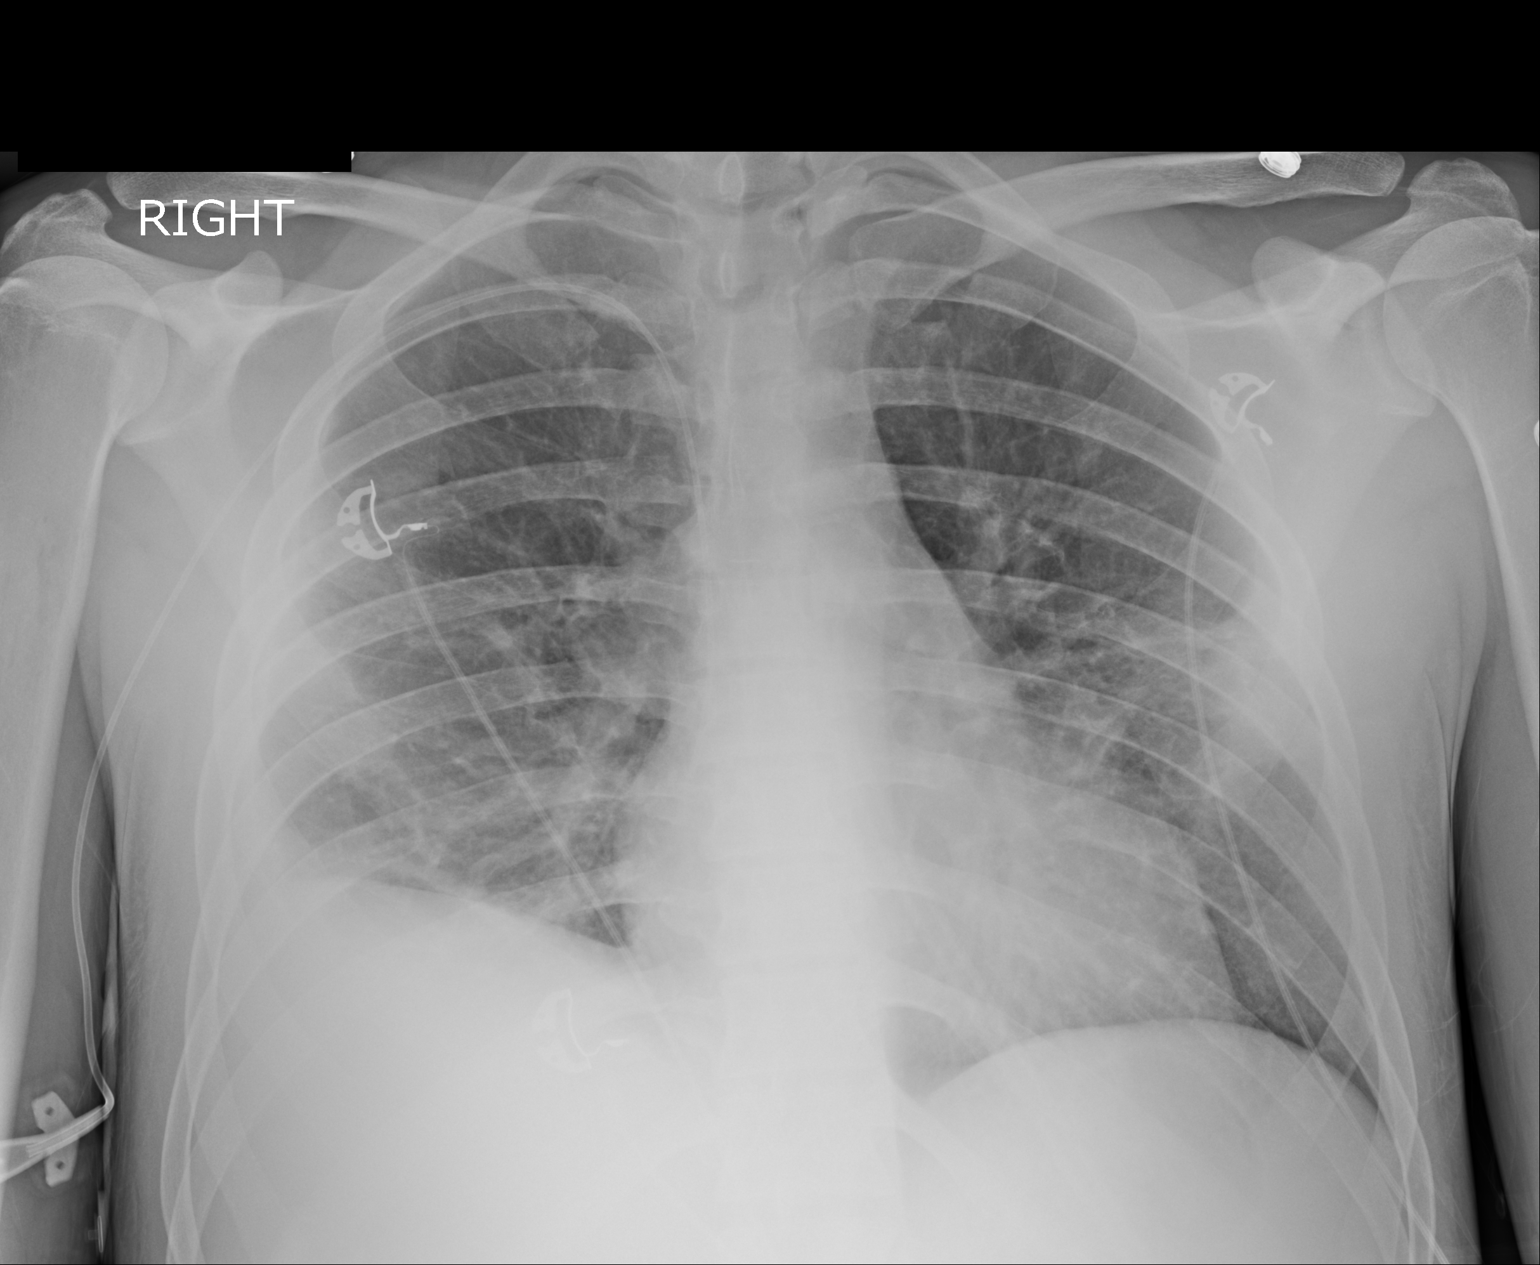

[1 of 1 positions shown; findings below may reference images not displayed]

FINDINGS: Cardiac shadow is stable. Previously seen left midlung infiltrate
has improved somewhat in the interval from the prior exam.
Persistent changes in the right base are noted. A right-sided PICC
line is again seen at the cavoatrial junction.
IMPRESSION: Slight improvement in the degree of left mid lung infiltrate. Stable
changes in the right base are noted.

## 2016-11-28 IMAGING — CT CT NECK W/ CM
3 of 5 series · 11 of 33 positions shown, 12 images · IV contrast (APPLIED)
Comparison: Brain MRI 11/21/2015. Cervical spine CT 07/07/2013.
Chest CT 11/18/2015.

CLINICAL DATA: 25-year-old male with history of IV drug use,
sepsis, septic emboli, sternal and lumbosacral osteomyelitis. Neck
pain at up prior tracheostomy site. Initial encounter.

EXAM:
CT NECK WITH CONTRAST
TECHNIQUE: Multidetector CT imaging of the neck was performed using the
standard protocol following the bolus administration of intravenous
contrast.
CONTRAST:  100 mL Omnipaque 300.

[Series 6: neck 2.0 i31s 3 · axial · 0.46mm/px · z∈[-316,-142]mm · 3 of 145 slices shown, 4 images]
[im 29/145  soft-tissue]
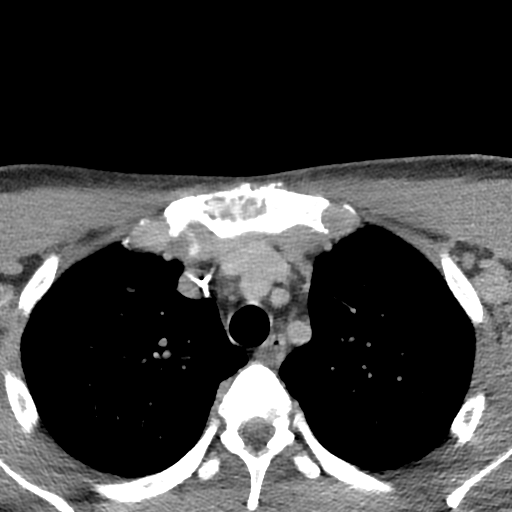
[im 29/145  bone]
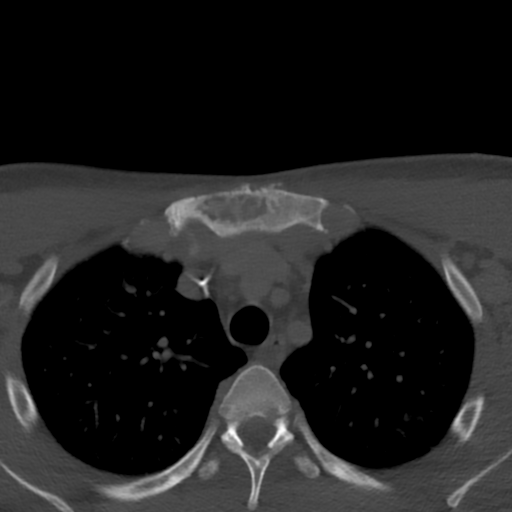
[im 87/145  bone]
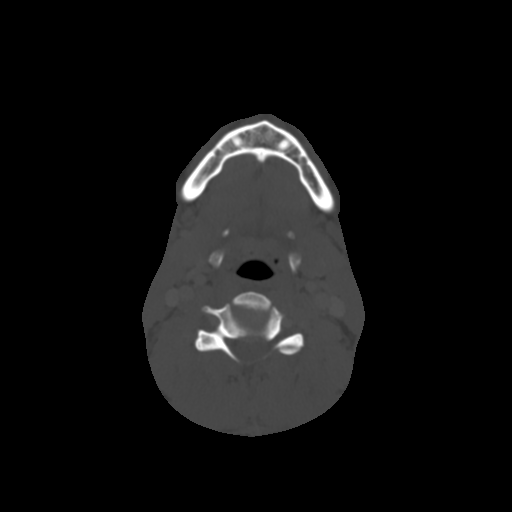
[im 116/145  bone]
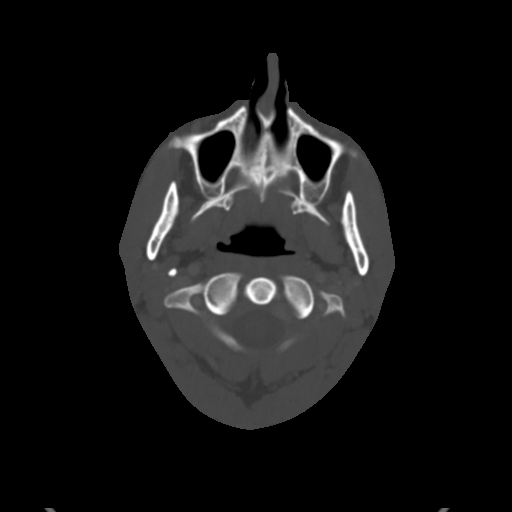

[Series 12: coronal st · coronal · 0.26mm/px · 3 of 101 slices shown]
[im 21/101  bone]
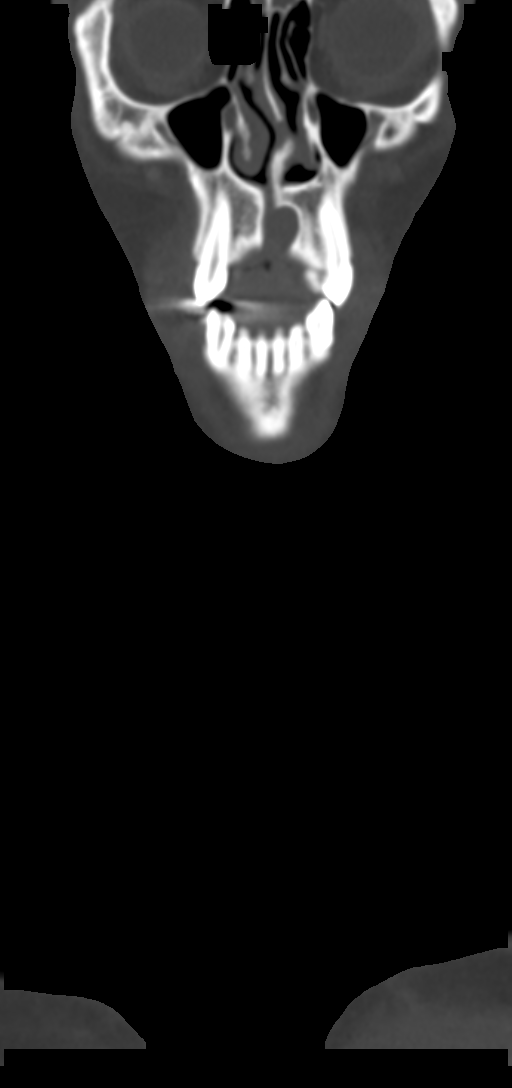
[im 41/101  bone]
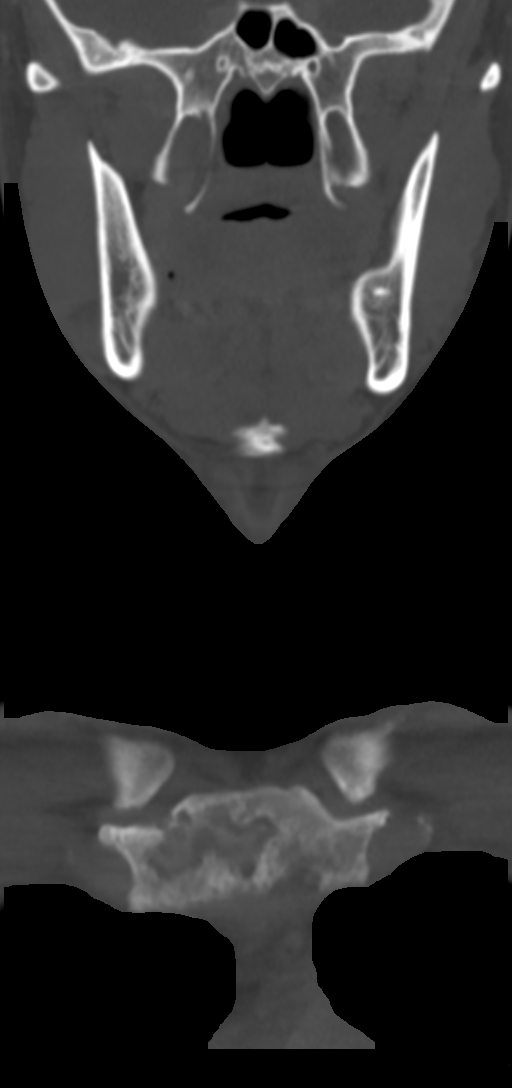
[im 61/101  bone]
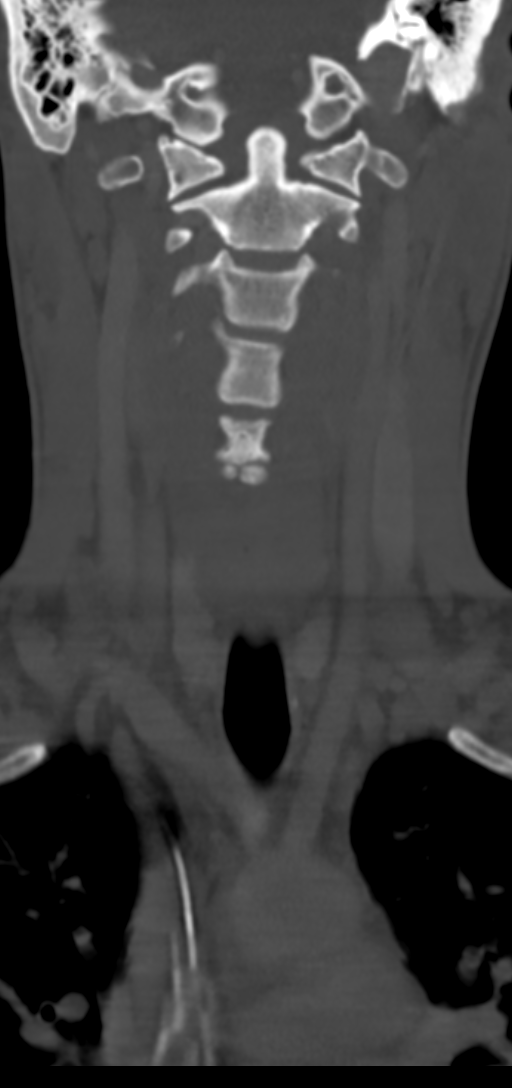

[Series 14: sagital st · sagittal · 0.39mm/px · 5 of 68 slices shown]
[im 12/68  bone]
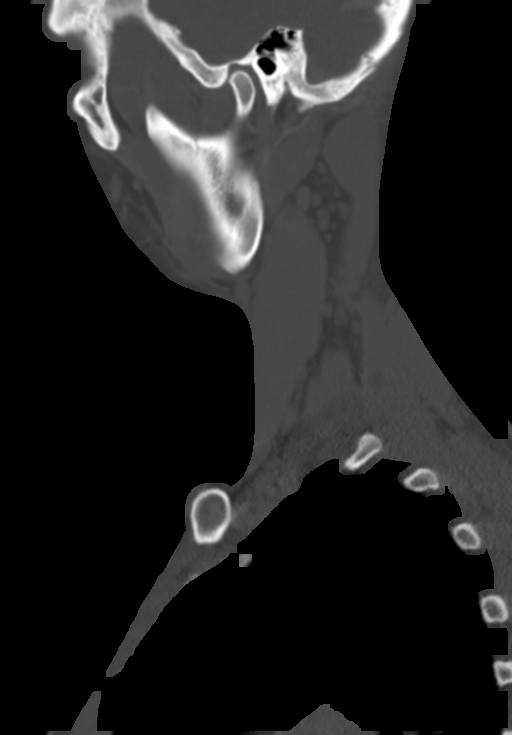
[im 23/68  bone]
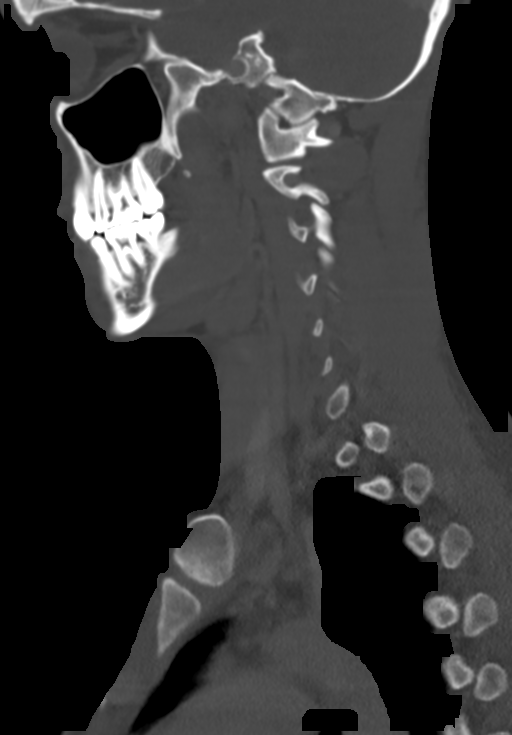
[im 34/68  bone]
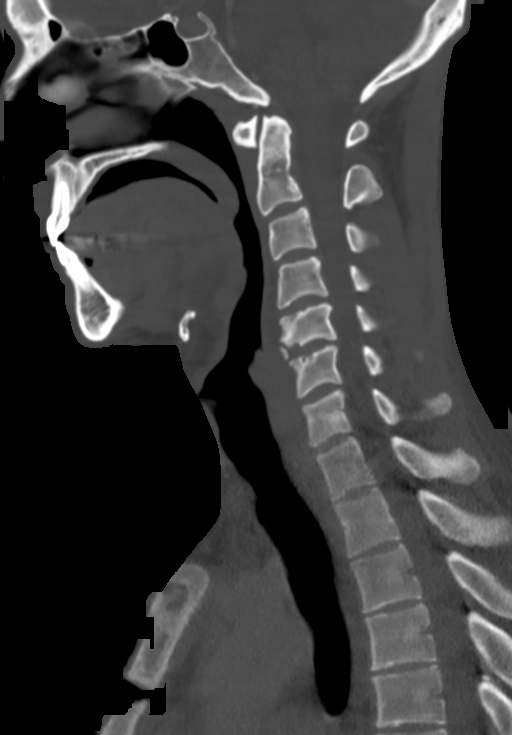
[im 45/68  bone]
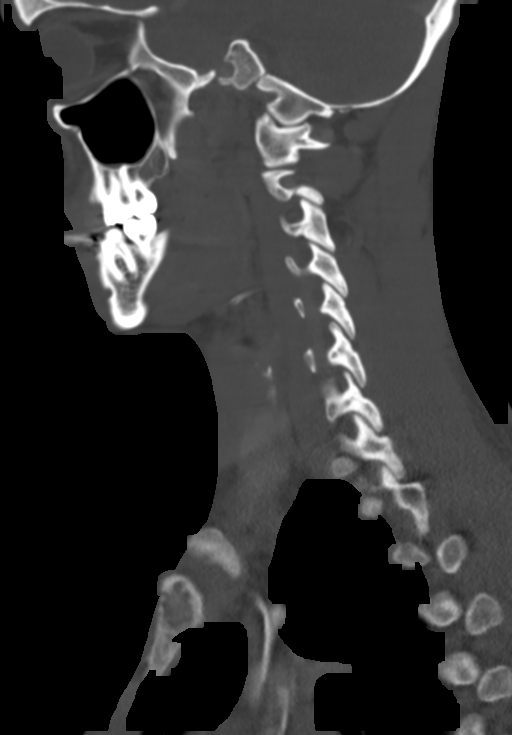
[im 56/68  bone]
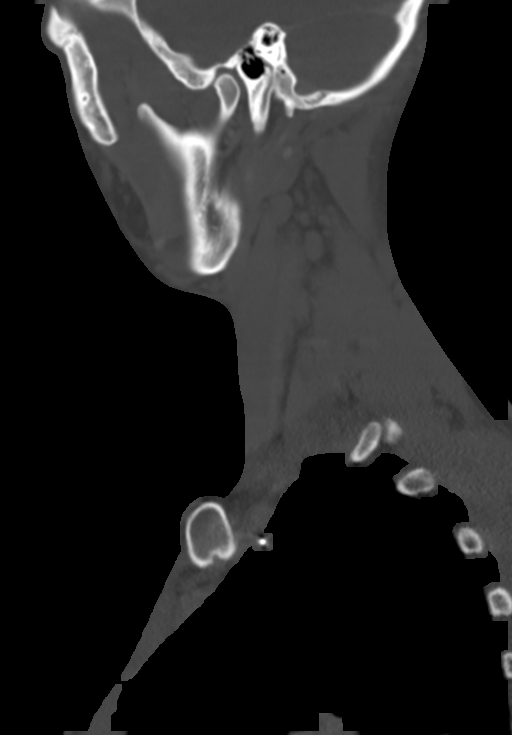

[11 of 33 positions shown; findings below may reference images not displayed]

FINDINGS: Pharynx and larynx: Mild superficial soft tissue irregularity
(series 6, image 97) at the level of the inferior thyroid
corresponding to likely site of tracheostomy. There is nonenhancing
linear probable granulation tissue extending from this skin site,
between the thyroid lobes, to the anterior wall of the trachea (same
image). There is a small area of lobulated soft tissue along the
anterior wall of the trachea, but no tracheal stenosis. No regional
inflammation is evident.

Salivary glands: Negative sublingual space. Submandibular and
parotid glands are within normal limits.

Thyroid: The soft tissue changes from prior tracheostomy extend just
caudal to the thyroid isthmus between the medial thyroid lobes, but
the thyroid gland otherwise appears normal.

Lymph nodes: No cervical lymphadenopathy.

Vascular: Major vascular structures in the neck and at the skullbase
appear patent. Both internal jugular veins are patent. There is a
right subclavian approach central line partially visible.

Limited intracranial: Negative.

Visualized orbits: Negative.

Mastoids and visualized paranasal sinuses: Minimal bubbly opacity in
the left sphenoid sinus. Other Visualized paranasal sinuses and
mastoids are stable and well pneumatized.

Skeleton: Stable cervical vertebral height and alignment. Chronic
endplate irregularity at C5 and at C6 is stable.

Subtotal erosion of the sternum with sternomanubrial gas and
overlying soft tissue defect with dressing in place is demonstrated.
Lytic changes of the sternum have progressed since [REDACTED]. The
sternoclavicular joints appear stable and relatively spared. There
is no longer any retrosternal gas. Mild retrosternal soft tissue
thickening and stranding is stable. The anterior mediastinum appears
only mildly affected. No retrosternal abscess is evident, the entire
sternum and mediastinum are not included.

Upper chest: See sternal findings in the skeleton section above.
Improved ventilation in the visualized upper lungs. Mild reactive
superior mediastinal lymphadenopathy.
IMPRESSION: 1. Progressed sternal erosions from osteomyelitis since 11/18/2015.
Gas within the sternomanubrial joint with overlying soft tissue
wound re- demonstrated. Visualized retrosternal soft tissues
demonstrate reactive inflammation and mild lymphadenopathy.
2. Expected soft tissue healing at prior tracheostomy site. Mild
soft tissue irregularity along the anterior wall of the trachea. No
associated abscess or acute inflammation.
3. Other neck soft tissues are within normal limits.
4. Stable mild endplate irregularity at C5 and C6 which may reflect
prior discitis.

## 2018-12-06 DIAGNOSIS — Z79899 Other long term (current) drug therapy: Secondary | ICD-10-CM | POA: Diagnosis not present

## 2018-12-13 DIAGNOSIS — Z79899 Other long term (current) drug therapy: Secondary | ICD-10-CM | POA: Diagnosis not present

## 2018-12-20 DIAGNOSIS — Z79899 Other long term (current) drug therapy: Secondary | ICD-10-CM | POA: Diagnosis not present

## 2018-12-23 DIAGNOSIS — F1994 Other psychoactive substance use, unspecified with psychoactive substance-induced mood disorder: Secondary | ICD-10-CM | POA: Diagnosis not present

## 2018-12-23 DIAGNOSIS — B1920 Unspecified viral hepatitis C without hepatic coma: Secondary | ICD-10-CM | POA: Diagnosis not present

## 2018-12-23 DIAGNOSIS — F1120 Opioid dependence, uncomplicated: Secondary | ICD-10-CM | POA: Diagnosis not present

## 2018-12-23 DIAGNOSIS — F1910 Other psychoactive substance abuse, uncomplicated: Secondary | ICD-10-CM | POA: Diagnosis not present

## 2018-12-23 DIAGNOSIS — Z1331 Encounter for screening for depression: Secondary | ICD-10-CM | POA: Diagnosis not present

## 2019-01-04 DIAGNOSIS — Z79899 Other long term (current) drug therapy: Secondary | ICD-10-CM | POA: Diagnosis not present

## 2019-02-15 DIAGNOSIS — Z79899 Other long term (current) drug therapy: Secondary | ICD-10-CM | POA: Diagnosis not present

## 2019-02-28 DIAGNOSIS — R1310 Dysphagia, unspecified: Secondary | ICD-10-CM | POA: Diagnosis not present

## 2019-03-02 DIAGNOSIS — F1123 Opioid dependence with withdrawal: Secondary | ICD-10-CM | POA: Diagnosis not present

## 2019-03-02 DIAGNOSIS — F1994 Other psychoactive substance use, unspecified with psychoactive substance-induced mood disorder: Secondary | ICD-10-CM | POA: Diagnosis not present

## 2019-03-02 DIAGNOSIS — B1920 Unspecified viral hepatitis C without hepatic coma: Secondary | ICD-10-CM | POA: Diagnosis not present

## 2019-03-02 DIAGNOSIS — Z23 Encounter for immunization: Secondary | ICD-10-CM | POA: Diagnosis not present

## 2019-03-02 DIAGNOSIS — F1910 Other psychoactive substance abuse, uncomplicated: Secondary | ICD-10-CM | POA: Diagnosis not present

## 2019-03-22 DIAGNOSIS — F1910 Other psychoactive substance abuse, uncomplicated: Secondary | ICD-10-CM | POA: Diagnosis not present

## 2019-03-22 DIAGNOSIS — H1030 Unspecified acute conjunctivitis, unspecified eye: Secondary | ICD-10-CM | POA: Diagnosis not present

## 2019-03-22 DIAGNOSIS — B1920 Unspecified viral hepatitis C without hepatic coma: Secondary | ICD-10-CM | POA: Diagnosis not present

## 2019-03-22 DIAGNOSIS — F1994 Other psychoactive substance use, unspecified with psychoactive substance-induced mood disorder: Secondary | ICD-10-CM | POA: Diagnosis not present

## 2019-03-29 DIAGNOSIS — Z87891 Personal history of nicotine dependence: Secondary | ICD-10-CM | POA: Diagnosis not present

## 2019-04-12 DIAGNOSIS — Z79899 Other long term (current) drug therapy: Secondary | ICD-10-CM | POA: Diagnosis not present

## 2019-05-05 DIAGNOSIS — F1123 Opioid dependence with withdrawal: Secondary | ICD-10-CM | POA: Diagnosis not present

## 2019-05-05 DIAGNOSIS — F1910 Other psychoactive substance abuse, uncomplicated: Secondary | ICD-10-CM | POA: Diagnosis not present

## 2019-05-05 DIAGNOSIS — F1994 Other psychoactive substance use, unspecified with psychoactive substance-induced mood disorder: Secondary | ICD-10-CM | POA: Diagnosis not present

## 2019-05-05 DIAGNOSIS — B1920 Unspecified viral hepatitis C without hepatic coma: Secondary | ICD-10-CM | POA: Diagnosis not present

## 2019-06-21 DIAGNOSIS — Z79899 Other long term (current) drug therapy: Secondary | ICD-10-CM | POA: Diagnosis not present

## 2019-07-19 DIAGNOSIS — Z79899 Other long term (current) drug therapy: Secondary | ICD-10-CM | POA: Diagnosis not present

## 2019-08-16 DIAGNOSIS — Z79899 Other long term (current) drug therapy: Secondary | ICD-10-CM | POA: Diagnosis not present

## 2019-09-12 DIAGNOSIS — Z79899 Other long term (current) drug therapy: Secondary | ICD-10-CM | POA: Diagnosis not present

## 2020-02-02 DIAGNOSIS — B1920 Unspecified viral hepatitis C without hepatic coma: Secondary | ICD-10-CM | POA: Diagnosis not present

## 2020-02-02 DIAGNOSIS — F330 Major depressive disorder, recurrent, mild: Secondary | ICD-10-CM | POA: Diagnosis not present

## 2020-02-02 DIAGNOSIS — R32 Unspecified urinary incontinence: Secondary | ICD-10-CM | POA: Diagnosis not present

## 2020-02-02 DIAGNOSIS — F1910 Other psychoactive substance abuse, uncomplicated: Secondary | ICD-10-CM | POA: Diagnosis not present

## 2020-02-08 DIAGNOSIS — F411 Generalized anxiety disorder: Secondary | ICD-10-CM | POA: Diagnosis not present

## 2020-02-08 DIAGNOSIS — F1121 Opioid dependence, in remission: Secondary | ICD-10-CM | POA: Diagnosis not present

## 2020-02-08 DIAGNOSIS — Z79899 Other long term (current) drug therapy: Secondary | ICD-10-CM | POA: Diagnosis not present

## 2020-02-08 DIAGNOSIS — F330 Major depressive disorder, recurrent, mild: Secondary | ICD-10-CM | POA: Diagnosis not present

## 2020-02-22 DIAGNOSIS — K5909 Other constipation: Secondary | ICD-10-CM | POA: Diagnosis not present

## 2020-02-22 DIAGNOSIS — R350 Frequency of micturition: Secondary | ICD-10-CM | POA: Diagnosis not present

## 2020-02-22 DIAGNOSIS — F330 Major depressive disorder, recurrent, mild: Secondary | ICD-10-CM | POA: Diagnosis not present

## 2020-02-22 DIAGNOSIS — F1121 Opioid dependence, in remission: Secondary | ICD-10-CM | POA: Diagnosis not present

## 2020-02-22 DIAGNOSIS — Z79899 Other long term (current) drug therapy: Secondary | ICD-10-CM | POA: Diagnosis not present

## 2020-02-22 DIAGNOSIS — N410 Acute prostatitis: Secondary | ICD-10-CM | POA: Diagnosis not present

## 2020-02-22 DIAGNOSIS — F411 Generalized anxiety disorder: Secondary | ICD-10-CM | POA: Diagnosis not present

## 2020-02-22 DIAGNOSIS — N3943 Post-void dribbling: Secondary | ICD-10-CM | POA: Diagnosis not present

## 2020-03-07 DIAGNOSIS — Z79899 Other long term (current) drug therapy: Secondary | ICD-10-CM | POA: Diagnosis not present

## 2020-03-07 DIAGNOSIS — Z23 Encounter for immunization: Secondary | ICD-10-CM | POA: Diagnosis not present

## 2020-03-07 DIAGNOSIS — F411 Generalized anxiety disorder: Secondary | ICD-10-CM | POA: Diagnosis not present

## 2020-03-07 DIAGNOSIS — F330 Major depressive disorder, recurrent, mild: Secondary | ICD-10-CM | POA: Diagnosis not present

## 2020-03-07 DIAGNOSIS — F1121 Opioid dependence, in remission: Secondary | ICD-10-CM | POA: Diagnosis not present

## 2020-03-07 DIAGNOSIS — F1120 Opioid dependence, uncomplicated: Secondary | ICD-10-CM | POA: Diagnosis not present

## 2020-03-21 DIAGNOSIS — F411 Generalized anxiety disorder: Secondary | ICD-10-CM | POA: Diagnosis not present

## 2020-03-21 DIAGNOSIS — Z79899 Other long term (current) drug therapy: Secondary | ICD-10-CM | POA: Diagnosis not present

## 2020-03-21 DIAGNOSIS — F1121 Opioid dependence, in remission: Secondary | ICD-10-CM | POA: Diagnosis not present

## 2020-03-21 DIAGNOSIS — F330 Major depressive disorder, recurrent, mild: Secondary | ICD-10-CM | POA: Diagnosis not present

## 2020-04-05 DIAGNOSIS — F330 Major depressive disorder, recurrent, mild: Secondary | ICD-10-CM | POA: Diagnosis not present

## 2020-04-05 DIAGNOSIS — Z79899 Other long term (current) drug therapy: Secondary | ICD-10-CM | POA: Diagnosis not present

## 2020-04-05 DIAGNOSIS — Z6822 Body mass index (BMI) 22.0-22.9, adult: Secondary | ICD-10-CM | POA: Diagnosis not present

## 2020-04-05 DIAGNOSIS — F411 Generalized anxiety disorder: Secondary | ICD-10-CM | POA: Diagnosis not present

## 2020-04-05 DIAGNOSIS — F1121 Opioid dependence, in remission: Secondary | ICD-10-CM | POA: Diagnosis not present

## 2020-04-09 DIAGNOSIS — R350 Frequency of micturition: Secondary | ICD-10-CM | POA: Diagnosis not present

## 2020-04-09 DIAGNOSIS — N410 Acute prostatitis: Secondary | ICD-10-CM | POA: Diagnosis not present

## 2020-04-09 DIAGNOSIS — N3943 Post-void dribbling: Secondary | ICD-10-CM | POA: Diagnosis not present

## 2020-04-09 DIAGNOSIS — K5909 Other constipation: Secondary | ICD-10-CM | POA: Diagnosis not present

## 2020-04-19 DIAGNOSIS — F330 Major depressive disorder, recurrent, mild: Secondary | ICD-10-CM | POA: Diagnosis not present

## 2020-04-19 DIAGNOSIS — F1121 Opioid dependence, in remission: Secondary | ICD-10-CM | POA: Diagnosis not present

## 2020-04-19 DIAGNOSIS — F411 Generalized anxiety disorder: Secondary | ICD-10-CM | POA: Diagnosis not present

## 2020-04-19 DIAGNOSIS — Z79899 Other long term (current) drug therapy: Secondary | ICD-10-CM | POA: Diagnosis not present

## 2020-05-03 DIAGNOSIS — Z87891 Personal history of nicotine dependence: Secondary | ICD-10-CM | POA: Diagnosis not present

## 2020-05-03 DIAGNOSIS — F411 Generalized anxiety disorder: Secondary | ICD-10-CM | POA: Diagnosis not present

## 2020-05-03 DIAGNOSIS — F330 Major depressive disorder, recurrent, mild: Secondary | ICD-10-CM | POA: Diagnosis not present

## 2020-05-03 DIAGNOSIS — F1121 Opioid dependence, in remission: Secondary | ICD-10-CM | POA: Diagnosis not present

## 2020-05-17 DIAGNOSIS — F411 Generalized anxiety disorder: Secondary | ICD-10-CM | POA: Diagnosis not present

## 2020-05-17 DIAGNOSIS — Z87891 Personal history of nicotine dependence: Secondary | ICD-10-CM | POA: Diagnosis not present

## 2020-05-17 DIAGNOSIS — Z79899 Other long term (current) drug therapy: Secondary | ICD-10-CM | POA: Diagnosis not present

## 2020-05-17 DIAGNOSIS — F1121 Opioid dependence, in remission: Secondary | ICD-10-CM | POA: Diagnosis not present

## 2020-05-17 DIAGNOSIS — F330 Major depressive disorder, recurrent, mild: Secondary | ICD-10-CM | POA: Diagnosis not present

## 2020-05-31 DIAGNOSIS — F330 Major depressive disorder, recurrent, mild: Secondary | ICD-10-CM | POA: Diagnosis not present

## 2020-05-31 DIAGNOSIS — F1121 Opioid dependence, in remission: Secondary | ICD-10-CM | POA: Diagnosis not present

## 2020-05-31 DIAGNOSIS — F411 Generalized anxiety disorder: Secondary | ICD-10-CM | POA: Diagnosis not present

## 2020-05-31 DIAGNOSIS — Z79899 Other long term (current) drug therapy: Secondary | ICD-10-CM | POA: Diagnosis not present

## 2020-05-31 DIAGNOSIS — Z87891 Personal history of nicotine dependence: Secondary | ICD-10-CM | POA: Diagnosis not present

## 2020-06-14 DIAGNOSIS — Z79899 Other long term (current) drug therapy: Secondary | ICD-10-CM | POA: Diagnosis not present

## 2020-06-14 DIAGNOSIS — F411 Generalized anxiety disorder: Secondary | ICD-10-CM | POA: Diagnosis not present

## 2020-06-14 DIAGNOSIS — F1121 Opioid dependence, in remission: Secondary | ICD-10-CM | POA: Diagnosis not present

## 2020-06-14 DIAGNOSIS — Z87891 Personal history of nicotine dependence: Secondary | ICD-10-CM | POA: Diagnosis not present

## 2020-06-14 DIAGNOSIS — F330 Major depressive disorder, recurrent, mild: Secondary | ICD-10-CM | POA: Diagnosis not present

## 2020-07-04 DIAGNOSIS — F411 Generalized anxiety disorder: Secondary | ICD-10-CM | POA: Diagnosis not present

## 2020-07-04 DIAGNOSIS — F1121 Opioid dependence, in remission: Secondary | ICD-10-CM | POA: Diagnosis not present

## 2020-07-04 DIAGNOSIS — Z79899 Other long term (current) drug therapy: Secondary | ICD-10-CM | POA: Diagnosis not present

## 2020-07-04 DIAGNOSIS — F330 Major depressive disorder, recurrent, mild: Secondary | ICD-10-CM | POA: Diagnosis not present

## 2020-07-18 DIAGNOSIS — Z79899 Other long term (current) drug therapy: Secondary | ICD-10-CM | POA: Diagnosis not present

## 2020-07-18 DIAGNOSIS — F1121 Opioid dependence, in remission: Secondary | ICD-10-CM | POA: Diagnosis not present

## 2020-07-18 DIAGNOSIS — F411 Generalized anxiety disorder: Secondary | ICD-10-CM | POA: Diagnosis not present

## 2020-07-18 DIAGNOSIS — F330 Major depressive disorder, recurrent, mild: Secondary | ICD-10-CM | POA: Diagnosis not present

## 2020-08-01 DIAGNOSIS — F411 Generalized anxiety disorder: Secondary | ICD-10-CM | POA: Diagnosis not present

## 2020-08-01 DIAGNOSIS — F1121 Opioid dependence, in remission: Secondary | ICD-10-CM | POA: Diagnosis not present

## 2020-08-01 DIAGNOSIS — Z79899 Other long term (current) drug therapy: Secondary | ICD-10-CM | POA: Diagnosis not present

## 2020-08-01 DIAGNOSIS — F330 Major depressive disorder, recurrent, mild: Secondary | ICD-10-CM | POA: Diagnosis not present

## 2020-08-15 DIAGNOSIS — Z79899 Other long term (current) drug therapy: Secondary | ICD-10-CM | POA: Diagnosis not present

## 2020-08-15 DIAGNOSIS — F411 Generalized anxiety disorder: Secondary | ICD-10-CM | POA: Diagnosis not present

## 2020-08-15 DIAGNOSIS — F330 Major depressive disorder, recurrent, mild: Secondary | ICD-10-CM | POA: Diagnosis not present

## 2020-08-15 DIAGNOSIS — F1121 Opioid dependence, in remission: Secondary | ICD-10-CM | POA: Diagnosis not present

## 2020-08-29 DIAGNOSIS — F330 Major depressive disorder, recurrent, mild: Secondary | ICD-10-CM | POA: Diagnosis not present

## 2020-08-29 DIAGNOSIS — Z79899 Other long term (current) drug therapy: Secondary | ICD-10-CM | POA: Diagnosis not present

## 2020-08-29 DIAGNOSIS — F1121 Opioid dependence, in remission: Secondary | ICD-10-CM | POA: Diagnosis not present

## 2020-08-29 DIAGNOSIS — F411 Generalized anxiety disorder: Secondary | ICD-10-CM | POA: Diagnosis not present

## 2020-09-12 DIAGNOSIS — F411 Generalized anxiety disorder: Secondary | ICD-10-CM | POA: Diagnosis not present

## 2020-09-12 DIAGNOSIS — F1121 Opioid dependence, in remission: Secondary | ICD-10-CM | POA: Diagnosis not present

## 2020-09-12 DIAGNOSIS — F330 Major depressive disorder, recurrent, mild: Secondary | ICD-10-CM | POA: Diagnosis not present

## 2020-09-12 DIAGNOSIS — Z79899 Other long term (current) drug therapy: Secondary | ICD-10-CM | POA: Diagnosis not present

## 2020-09-26 DIAGNOSIS — F330 Major depressive disorder, recurrent, mild: Secondary | ICD-10-CM | POA: Diagnosis not present

## 2020-09-26 DIAGNOSIS — F411 Generalized anxiety disorder: Secondary | ICD-10-CM | POA: Diagnosis not present

## 2020-09-26 DIAGNOSIS — F1121 Opioid dependence, in remission: Secondary | ICD-10-CM | POA: Diagnosis not present

## 2020-09-26 DIAGNOSIS — Z79899 Other long term (current) drug therapy: Secondary | ICD-10-CM | POA: Diagnosis not present

## 2020-10-10 DIAGNOSIS — F411 Generalized anxiety disorder: Secondary | ICD-10-CM | POA: Diagnosis not present

## 2020-10-10 DIAGNOSIS — F330 Major depressive disorder, recurrent, mild: Secondary | ICD-10-CM | POA: Diagnosis not present

## 2020-10-10 DIAGNOSIS — F1121 Opioid dependence, in remission: Secondary | ICD-10-CM | POA: Diagnosis not present

## 2020-10-10 DIAGNOSIS — Z79899 Other long term (current) drug therapy: Secondary | ICD-10-CM | POA: Diagnosis not present

## 2020-10-24 DIAGNOSIS — F1121 Opioid dependence, in remission: Secondary | ICD-10-CM | POA: Diagnosis not present

## 2020-10-24 DIAGNOSIS — F1120 Opioid dependence, uncomplicated: Secondary | ICD-10-CM | POA: Diagnosis not present

## 2020-10-24 DIAGNOSIS — Z79899 Other long term (current) drug therapy: Secondary | ICD-10-CM | POA: Diagnosis not present

## 2020-10-24 DIAGNOSIS — F411 Generalized anxiety disorder: Secondary | ICD-10-CM | POA: Diagnosis not present

## 2020-10-24 DIAGNOSIS — F330 Major depressive disorder, recurrent, mild: Secondary | ICD-10-CM | POA: Diagnosis not present

## 2020-11-07 DIAGNOSIS — F411 Generalized anxiety disorder: Secondary | ICD-10-CM | POA: Diagnosis not present

## 2020-11-07 DIAGNOSIS — F1121 Opioid dependence, in remission: Secondary | ICD-10-CM | POA: Diagnosis not present

## 2020-11-07 DIAGNOSIS — F330 Major depressive disorder, recurrent, mild: Secondary | ICD-10-CM | POA: Diagnosis not present

## 2020-11-21 DIAGNOSIS — Z79899 Other long term (current) drug therapy: Secondary | ICD-10-CM | POA: Diagnosis not present

## 2020-11-21 DIAGNOSIS — F411 Generalized anxiety disorder: Secondary | ICD-10-CM | POA: Diagnosis not present

## 2020-11-21 DIAGNOSIS — F330 Major depressive disorder, recurrent, mild: Secondary | ICD-10-CM | POA: Diagnosis not present

## 2020-11-21 DIAGNOSIS — F1121 Opioid dependence, in remission: Secondary | ICD-10-CM | POA: Diagnosis not present
